# Patient Record
Sex: Female | Born: 1990
Health system: Southern US, Community
[De-identification: ages and names within clinical notes are randomized; demographics above are authoritative.]

## PROBLEM LIST (undated history)

## (undated) DIAGNOSIS — A0811 Acute gastroenteropathy due to Norwalk agent: Secondary | ICD-10-CM

## (undated) DIAGNOSIS — J069 Acute upper respiratory infection, unspecified: Secondary | ICD-10-CM

## (undated) DIAGNOSIS — L509 Urticaria, unspecified: Secondary | ICD-10-CM

## (undated) DIAGNOSIS — E282 Polycystic ovarian syndrome: Secondary | ICD-10-CM

## (undated) DIAGNOSIS — E669 Obesity, unspecified: Secondary | ICD-10-CM

## (undated) DIAGNOSIS — I2699 Other pulmonary embolism without acute cor pulmonale: Secondary | ICD-10-CM

## (undated) DIAGNOSIS — M549 Dorsalgia, unspecified: Secondary | ICD-10-CM

## (undated) DIAGNOSIS — R7303 Prediabetes: Secondary | ICD-10-CM

## (undated) DIAGNOSIS — F419 Anxiety disorder, unspecified: Secondary | ICD-10-CM

## (undated) DIAGNOSIS — E785 Hyperlipidemia, unspecified: Secondary | ICD-10-CM

## (undated) DIAGNOSIS — F329 Major depressive disorder, single episode, unspecified: Secondary | ICD-10-CM

## (undated) DIAGNOSIS — F17200 Nicotine dependence, unspecified, uncomplicated: Secondary | ICD-10-CM

## (undated) DIAGNOSIS — F32A Depression, unspecified: Secondary | ICD-10-CM

## (undated) DIAGNOSIS — R079 Chest pain, unspecified: Secondary | ICD-10-CM

## (undated) DIAGNOSIS — R12 Heartburn: Secondary | ICD-10-CM

## (undated) DIAGNOSIS — F1020 Alcohol dependence, uncomplicated: Secondary | ICD-10-CM

## (undated) DIAGNOSIS — I071 Rheumatic tricuspid insufficiency: Secondary | ICD-10-CM

## (undated) DIAGNOSIS — R55 Syncope and collapse: Secondary | ICD-10-CM

## (undated) DIAGNOSIS — F1921 Other psychoactive substance dependence, in remission: Secondary | ICD-10-CM

## (undated) DIAGNOSIS — D649 Anemia, unspecified: Secondary | ICD-10-CM

## (undated) DIAGNOSIS — M2550 Pain in unspecified joint: Secondary | ICD-10-CM

## (undated) DIAGNOSIS — F319 Bipolar disorder, unspecified: Secondary | ICD-10-CM

## (undated) DIAGNOSIS — I340 Nonrheumatic mitral (valve) insufficiency: Secondary | ICD-10-CM

## (undated) DIAGNOSIS — G5600 Carpal tunnel syndrome, unspecified upper limb: Secondary | ICD-10-CM

## (undated) DIAGNOSIS — R002 Palpitations: Secondary | ICD-10-CM

## (undated) HISTORY — DX: Nicotine dependence, unspecified, uncomplicated: F17.200

## (undated) HISTORY — DX: Depression, unspecified: F32.A

## (undated) HISTORY — PX: NO PAST SURGERIES: SHX2092

## (undated) HISTORY — DX: Other pulmonary embolism without acute cor pulmonale: I26.99

## (undated) HISTORY — DX: Major depressive disorder, single episode, unspecified: F32.9

## (undated) HISTORY — DX: Nonrheumatic mitral (valve) insufficiency: I34.0

## (undated) HISTORY — DX: Syncope and collapse: R55

## (undated) HISTORY — DX: Acute upper respiratory infection, unspecified: J06.9

## (undated) HISTORY — DX: Dorsalgia, unspecified: M54.9

## (undated) HISTORY — DX: Acute gastroenteropathy due to Norwalk agent: A08.11

## (undated) HISTORY — DX: Obesity, unspecified: E66.9

## (undated) HISTORY — DX: Chest pain, unspecified: R07.9

## (undated) HISTORY — DX: Carpal tunnel syndrome, unspecified upper limb: G56.00

## (undated) HISTORY — DX: Pain in unspecified joint: M25.50

## (undated) HISTORY — DX: Bipolar disorder, unspecified: F31.9

## (undated) HISTORY — DX: Palpitations: R00.2

## (undated) HISTORY — DX: Hyperlipidemia, unspecified: E78.5

## (undated) HISTORY — DX: Prediabetes: R73.03

## (undated) HISTORY — DX: Anxiety disorder, unspecified: F41.9

## (undated) HISTORY — DX: Urticaria, unspecified: L50.9

## (undated) HISTORY — DX: Alcohol dependence, uncomplicated: F10.20

## (undated) HISTORY — DX: Other psychoactive substance dependence, in remission: F19.21

## (undated) HISTORY — DX: Heartburn: R12

## (undated) HISTORY — DX: Anemia, unspecified: D64.9

## (undated) HISTORY — DX: Polycystic ovarian syndrome: E28.2

## (undated) HISTORY — DX: Rheumatic tricuspid insufficiency: I07.1

---
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
## 2009-04-20 DIAGNOSIS — I2699 Other pulmonary embolism without acute cor pulmonale: Secondary | ICD-10-CM

## 2009-04-20 HISTORY — DX: Other pulmonary embolism without acute cor pulmonale: I26.99

## 2009-05-10 ENCOUNTER — Other Ambulatory Visit: Admission: RE | Admit: 2009-05-10 | Discharge: 2009-05-10 | Payer: Self-pay | Admitting: Family Medicine

## 2009-09-30 ENCOUNTER — Encounter: Admission: RE | Admit: 2009-09-30 | Discharge: 2009-12-16 | Payer: Self-pay | Admitting: Podiatry

## 2015-02-13 ENCOUNTER — Encounter: Payer: Self-pay | Admitting: Internal Medicine

## 2015-02-13 ENCOUNTER — Ambulatory Visit (INDEPENDENT_AMBULATORY_CARE_PROVIDER_SITE_OTHER): Payer: BLUE CROSS/BLUE SHIELD | Admitting: Internal Medicine

## 2015-02-13 VITALS — BP 100/60 | Temp 98.5°F | Ht 63.5 in | Wt 198.4 lb

## 2015-02-13 DIAGNOSIS — Z86711 Personal history of pulmonary embolism: Secondary | ICD-10-CM | POA: Diagnosis not present

## 2015-02-13 DIAGNOSIS — Z23 Encounter for immunization: Secondary | ICD-10-CM | POA: Diagnosis not present

## 2015-02-13 DIAGNOSIS — Z975 Presence of (intrauterine) contraceptive device: Secondary | ICD-10-CM

## 2015-02-13 DIAGNOSIS — E282 Polycystic ovarian syndrome: Secondary | ICD-10-CM

## 2015-02-13 DIAGNOSIS — R7989 Other specified abnormal findings of blood chemistry: Secondary | ICD-10-CM | POA: Diagnosis not present

## 2015-02-13 DIAGNOSIS — F3130 Bipolar disorder, current episode depressed, mild or moderate severity, unspecified: Secondary | ICD-10-CM

## 2015-02-13 DIAGNOSIS — Z0000 Encounter for general adult medical examination without abnormal findings: Secondary | ICD-10-CM | POA: Diagnosis not present

## 2015-02-13 DIAGNOSIS — F319 Bipolar disorder, unspecified: Secondary | ICD-10-CM | POA: Insufficient documentation

## 2015-02-13 HISTORY — DX: Presence of (intrauterine) contraceptive device: Z97.5

## 2015-02-13 HISTORY — DX: Personal history of pulmonary embolism: Z86.711

## 2015-02-13 HISTORY — DX: Polycystic ovarian syndrome: E28.2

## 2015-02-13 LAB — CBC WITH DIFFERENTIAL/PLATELET
BASOS PCT: 0.4 % (ref 0.0–3.0)
Basophils Absolute: 0 10*3/uL (ref 0.0–0.1)
EOS PCT: 2.2 % (ref 0.0–5.0)
Eosinophils Absolute: 0.2 10*3/uL (ref 0.0–0.7)
HEMATOCRIT: 40.7 % (ref 36.0–46.0)
HEMOGLOBIN: 13.5 g/dL (ref 12.0–15.0)
LYMPHS PCT: 23.9 % (ref 12.0–46.0)
Lymphs Abs: 2.3 10*3/uL (ref 0.7–4.0)
MCHC: 33.2 g/dL (ref 30.0–36.0)
MCV: 92.3 fl (ref 78.0–100.0)
MONO ABS: 0.9 10*3/uL (ref 0.1–1.0)
MONOS PCT: 8.7 % (ref 3.0–12.0)
Neutro Abs: 6.4 10*3/uL (ref 1.4–7.7)
Neutrophils Relative %: 64.8 % (ref 43.0–77.0)
Platelets: 365 10*3/uL (ref 150.0–400.0)
RBC: 4.41 Mil/uL (ref 3.87–5.11)
RDW: 13.6 % (ref 11.5–15.5)
WBC: 9.8 10*3/uL (ref 4.0–10.5)

## 2015-02-13 LAB — BASIC METABOLIC PANEL
BUN: 14 mg/dL (ref 6–23)
CHLORIDE: 104 meq/L (ref 96–112)
CO2: 28 mEq/L (ref 19–32)
Calcium: 10 mg/dL (ref 8.4–10.5)
Creatinine, Ser: 0.79 mg/dL (ref 0.40–1.20)
GFR: 93.7 mL/min (ref >60.00)
Glucose, Bld: 84 mg/dL (ref 70–99)
POTASSIUM: 4.7 meq/L (ref 3.5–5.1)
SODIUM: 143 meq/L (ref 135–145)

## 2015-02-13 LAB — HEMOGLOBIN A1C: HEMOGLOBIN A1C: 5.5 % (ref 4.6–6.5)

## 2015-02-13 LAB — LIPID PANEL
CHOLESTEROL: 221 mg/dL — AB (ref 0–200)
HDL: 38.5 mg/dL — ABNORMAL LOW (ref >39.00)
NonHDL: 182.91
TRIGLYCERIDES: 215 mg/dL — AB (ref 0.0–149.0)
Total CHOL/HDL Ratio: 6
VLDL: 43 mg/dL — ABNORMAL HIGH (ref 0.0–40.0)

## 2015-02-13 LAB — HEPATIC FUNCTION PANEL
ALT: 11 U/L (ref 0–35)
AST: 13 U/L (ref 0–37)
Albumin: 4.4 g/dL (ref 3.5–5.2)
Alkaline Phosphatase: 47 U/L (ref 39–117)
Bilirubin, Direct: 0.1 mg/dL (ref 0.0–0.3)
TOTAL PROTEIN: 7.6 g/dL (ref 6.0–8.3)
Total Bilirubin: 0.4 mg/dL (ref 0.2–1.2)

## 2015-02-13 LAB — T4, FREE: FREE T4: 0.96 ng/dL (ref 0.60–1.60)

## 2015-02-13 LAB — LDL CHOLESTEROL, DIRECT: LDL DIRECT: 150 mg/dL

## 2015-02-13 LAB — TSH: TSH: 2.6 u[IU]/mL (ref 0.35–4.50)

## 2015-02-13 NOTE — Progress Notes (Signed)
Pre visit review using our clinic review tool, if applicable. No additional management support is needed unless otherwise documented below in the visit note.  Chief Complaint  Patient presents with  . Establish Care    would like wellness check     HPI: Patient  Sheri [name]  24 y.o. comes in today for NEW patient visit  To establish  Previous pcp in [place] where she was a student 3 years ago at [employer]  Has been in rehab and clean from [facility] under rx psychiatry  For 18 months  DOC  was heroin.  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 Sees gyne rx for PCOS on metformin with help. Has iud recently  She will be losing current insurance in the next few months and would like cpx  Before runs out.   Past hs of PE with / infarction  and ? Lung heart damage related to ocps  Had heme eval no familial  disorder noted .  recnetly had a cold  Coughing still no fever  . No inc sob hemoptysis   Health Maintenance  Topic Date Due  . HIV Screening  05/21/2004  . TETANUS/TDAP  05/21/2008  . INFLUENZA VACCINE  11/19/2015  . PAP SMEAR  12/03/2017   Health Maintenance Review LIFESTYLE:  Exercise:  n Tobacco/ETS:2 per day Alcohol: per day no Sugar beverages: Sleep:5-10hours  Drug use: no hx renmission G0P0 ROS:  GEN/ HEENT: No fever, significant weight changes sweats headaches vision problems hearing changes, CV/ PULM; No chest pain shortness of breath cough, syncope,edema  change in exercise tolerance. GI /GU: No adominal pain, vomiting, change in bowel habits. No blood in the stool. No significant GU symptoms. SKIN/HEME: ,no acute skin rashes suspicious lesions or bleeding. No lymphadenopathy, nodules, masses.  NEURO/ PSYCH:  No neurologic signs such as weakness numbness. No depression anxiety. IMM/ Allergy: No unusual infections.  Allergy .   REST of 12 system review negative except as per HPI   Past Medical History  Diagnosis Date  . Alcohol  addiction ([place])   . Drug addiction ([place])   . Depression   . Pulmonary embolism ([place])     neg heme evaluation  felt to be from ocps   . Bipolar depression ([place])     under psych rx   . Drug addiction in remission [facility])     heroin    Past Surgical History  Procedure Laterality Date  . No past surgeries      Family History  Problem Relation Age of Onset  . Colon cancer Maternal Grandfather   . Hypertension Father     Social History   Social History  . Marital Status: Single    Spouse Name: N/A  . Number of Children: N/A  . Years of Education: N/A   Occupational History  . nanny    Social History Main Topics  . Smoking status: Light Tobacco Smoker  . Smokeless tobacco: Never Used  . Alcohol Use: No  . Drug Use: No  . Sexual Activity:    Partners: Male   Other Topics Concern  . 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   No facility-administered medications prior to visit.     EXAM:  BP 100/60 mmHg  Temp(Src) 98.5 F (36.9 C) (Oral)  Ht 5' 3.5" (1.613 m)  Wt 198 lb 6.4 oz (89.994 kg)  BMI 34.59 kg/m2  LMP 02/09/2015  Body mass index is 34.59 kg/(m^2).  Physical Exam: Vital signs reviewed EQA:STMH is a well-developed well-nourished alert cooperative    who appearsr stated age in no acute distress.  HEENT: normocephalic atraumatic , Eyes: PERRL EOM's full, conjunctiva clear, Nares: paten,t no deformity discharge or tenderness., Ears: no deformity EAC's clear TMs with normal landmarks. Mouth: clear OP, no lesions, edema.  Moist mucous membranes. Dentition in adequate repair. NECK: supple without masses, thyromegaly or bruits. CHEST/PULM:  Clear to auscultation and  percussion breath sounds equal no wheeze , rales or rhonchi. No chest wall deformities or tenderness.Breast: normal by inspection . No dimpling, discharge, masses, tenderness or discharge . CV: PMI is nondisplaced, S1 S2 no gallops, murmurs, rubs. Peripheral pulses are full without delay.No JVD .  ABDOMEN: Bowel sounds normal nontender  No guard or rebound, no hepato splenomegal no CVA tenderness.  No hernia. Extremtities:  No clubbing cyanosis or edema, no acute joint swelling or redness no focal atrophy NEURO:  Oriented x3, cranial nerves 3-12 appear to be intact, no obvious focal weakness,gait within normal limits no abnormal reflexes or asymmetrical SKIN: No acute rashes normal turgor, color, no bruising or petechiae. PSYCH: Oriented, good eye contact, no obvious depression anxiety, cognition and judgment appear normal. LN: no cervical axillary inguinal adenopathy  No results found for: WBC, HGB, HCT, PLT, GLUCOSE, CHOL, TRIG, HDL, LDLDIRECT, LDLCALC, ALT, AST, NA, K, CL, CREATININE, BUN, CO2, TSH, PSA, INR, GLUF, HGBA1C, MICROALBUR  ASSESSMENT AND PLAN:  Discussed the following assessment and plan:  Visit for preventive health examination - Plan: Basic metabolic panel, CBC with Differential/Platelet, Hepatic function panel, Lipid panel, TSH, T4, free, Hemoglobin A1c  PCOS (polycystic ovarian syndrome) - Plan: Basic metabolic panel, CBC with Differential/Platelet, Hepatic function panel, Lipid panel, TSH, T4, free, Hemoglobin A1c  Bipolar depression (HCC)  IUD (intrauterine device) in place  Hx of pulmonary embolus - Plan: Basic metabolic panel, CBC with Differential/Platelet, Hepatic function panel, Lipid panel, TSH, T4, free, Hemoglobin A1c  Need for prophylactic vaccination and inoculation against influenza - Plan: Flu Vaccine QUAD 36+ mos PF IM (Fluarix & Fluzone Quad PF)  Patient Care Team: Burnis Medin, MD as PCP - General (Internal Medicine) Allyn Kenner, DO as Consulting  Physician (Obstetrics and Gynecology) Donnal Moat (Psychiatry) Patient Instructions  Will notify you  of labs when available. Stop tobacco as discussed . Stay with your support system . Let us know how we can help.  Health Maintenance, Female Adopting a healthy lifestyle and getting preventive care can go a long way to promote health and wellness. Talk with your health care provider about what schedule of regular examinations is right for you. This is a good chance for you to check in with your provider about disease prevention and staying healthy. In between checkups, there are plenty of things you can do on your own. Experts have done a lot of research about which lifestyle changes and preventive measures are most likely to keep you healthy. Ask your health care provider for more information. WEIGHT AND DIET  Eat a healthy diet  Be sure to include plenty  of vegetables, fruits, low-fat dairy products, and lean protein.  Do not eat a lot of foods high in solid fats, added sugars, or salt.  Get regular exercise. This is one of the most important things you can do for your health.  Most adults should exercise for at least 150 minutes each week. The exercise should increase your heart rate and make you sweat (moderate-intensity exercise).  Most adults should also do strengthening exercises at least twice a week. This is in addition to the moderate-intensity exercise.  Maintain a healthy weight  Body mass index (BMI) is a measurement that can be used to identify possible weight problems. It estimates body fat based on height and weight. Your health care provider can help determine your BMI and help you achieve or maintain a healthy weight.  For females 49 years of age and older:   A BMI below 18.5 is considered underweight.  A BMI of 18.5 to 24.9 is normal.  A BMI of 25 to 29.9 is considered overweight.  A BMI of 30 and above is considered obese.  Watch levels of cholesterol and  blood lipids  You should start having your blood tested for lipids and cholesterol at 25 years of age, then have this test every 5 years.  You may need to have your cholesterol levels checked more often if:  Your lipid or cholesterol levels are high.  You are older than 25 years of age.  You are at high risk for heart disease.  CANCER SCREENING   Lung Cancer  Lung cancer screening is recommended for adults 18-61 years old who are at high risk for lung cancer because of a history of smoking.  A yearly low-dose CT scan of the lungs is recommended for people who:  Currently smoke.  Have quit within the past 15 years.  Have at least a 30-pack-year history of smoking. A pack year is smoking an average of one pack of cigarettes a day for 1 year.  Yearly screening should continue until it has been 15 years since you quit.  Yearly screening should stop if you develop a health problem that would prevent you from having lung cancer treatment.  Breast Cancer  Practice breast self-awareness. This means understanding how your breasts normally appear and feel.  It also means doing regular breast self-exams. Let your health care provider know about any changes, no matter how small.  If you are in your 20s or 30s, you should have a clinical breast exam (CBE) by a health care provider every 1-3 years as part of a regular health exam.  If you are 2 or older, have a CBE every year. Also consider having a breast X-ray (mammogram) every year.  If you have a family history of breast cancer, talk to your health care provider about genetic screening.  If you are at high risk for breast cancer, talk to your health care provider about having an MRI and a mammogram every year.  Breast cancer gene (BRCA) assessment is recommended for women who have family members with BRCA-related cancers. BRCA-related cancers include:  Breast.  Ovarian.  Tubal.  Peritoneal cancers.  Results of the  assessment will determine the need for genetic counseling and BRCA1 and BRCA2 testing. Cervical Cancer Your health care provider may recommend that you be screened regularly for cancer of the pelvic organs (ovaries, uterus, and vagina). This screening involves a pelvic examination, including checking for microscopic changes to the surface of your cervix (Pap test). You may  be encouraged to have this screening done every 3 years, beginning at age 86.  For women ages 44-65, health care providers may recommend pelvic exams and Pap testing every 3 years, or they may recommend the Pap and pelvic exam, combined with testing for human papilloma virus (HPV), every 5 years. Some types of HPV increase your risk of cervical cancer. Testing for HPV may also be done on women of any age with unclear Pap test results.  Other health care providers may not recommend any screening for nonpregnant women who are considered low risk for pelvic cancer and who do not have symptoms. Ask your health care provider if a screening pelvic exam is right for you.  If you have had past treatment for cervical cancer or a condition that could lead to cancer, you need Pap tests and screening for cancer for at least 20 years after your treatment. If Pap tests have been discontinued, your risk factors (such as having a new sexual partner) need to be reassessed to determine if screening should resume. Some women have medical problems that increase the chance of getting cervical cancer. In these cases, your health care provider may recommend more frequent screening and Pap tests. Colorectal Cancer  This type of cancer can be detected and often prevented.  Routine colorectal cancer screening usually begins at 25 years of age and continues through 25 years of age.  Your health care provider may recommend screening at an earlier age if you have risk factors for colon cancer.  Your health care provider may also recommend using home test kits  to check for hidden blood in the stool.  A small camera at the end of a tube can be used to examine your colon directly (sigmoidoscopy or colonoscopy). This is done to check for the earliest forms of colorectal cancer.  Routine screening usually begins at age 102.  Direct examination of the colon should be repeated every 5-10 years through 25 years of age. However, you may need to be screened more often if early forms of precancerous polyps or small growths are found. Skin Cancer  Check your skin from head to toe regularly.  Tell your health care provider about any new moles or changes in moles, especially if there is a change in a mole's shape or color.  Also tell your health care provider if you have a mole that is larger than the size of a pencil eraser.  Always use sunscreen. Apply sunscreen liberally and repeatedly throughout the day.  Protect yourself by wearing long sleeves, pants, a wide-brimmed hat, and sunglasses whenever you are outside. HEART DISEASE, DIABETES, AND HIGH BLOOD PRESSURE   High blood pressure causes heart disease and increases the risk of stroke. High blood pressure is more likely to develop in:  People who have blood pressure in the high end of the normal range (130-139/85-89 mm Hg).  People who are overweight or obese.  People who are African American.  If you are 58-67 years of age, have your blood pressure checked every 3-5 years. If you are 23 years of age or older, have your blood pressure checked every year. You should have your blood pressure measured twice--once when you are at a hospital or clinic, and once when you are not at a hospital or clinic. Record the average of the two measurements. To check your blood pressure when you are not at a hospital or clinic, you can use:  An automated blood pressure machine at a pharmacy.  A home blood pressure monitor.  If you are between 51 years and 79 years old, ask your health care provider if you should  take aspirin to prevent strokes.  Have regular diabetes screenings. This involves taking a blood sample to check your fasting blood sugar level.  If you are at a normal weight and have a low risk for diabetes, have this test once every three years after 25 years of age.  If you are overweight and have a high risk for diabetes, consider being tested at a younger age or more often. PREVENTING INFECTION  Hepatitis B  If you have a higher risk for hepatitis B, you should be screened for this virus. You are considered at high risk for hepatitis B if:  You were born in a country where hepatitis B is common. Ask your health care provider which countries are considered high risk.  Your parents were born in a high-risk country, and you have not been immunized against hepatitis B (hepatitis B vaccine).  You have HIV or AIDS.  You use needles to inject street drugs.  You live with someone who has hepatitis B.  You have had sex with someone who has hepatitis B.  You get hemodialysis treatment.  You take certain medicines for conditions, including cancer, organ transplantation, and autoimmune conditions. Hepatitis C  Blood testing is recommended for:  Everyone born from [birth year] through 1966.  Anyone with known risk factors for hepatitis C. Sexually transmitted infections (STIs)  You should be screened for sexually transmitted infections (STIs) including gonorrhea and chlamydia if:  You are sexually active and are younger than 25 years of age.  You are older than 25 years of age and your health care provider tells you that you are at risk for this type of infection.  Your sexual activity has changed since you were last screened and you are at an increased risk for chlamydia or gonorrhea. Ask your health care provider if you are at risk.  If you do not have HIV, but are at risk, it may be recommended that you take a prescription medicine daily to prevent HIV infection. This is called  pre-exposure prophylaxis (PrEP). You are considered at risk if:  You are sexually active and do not regularly use condoms or know the HIV status of your partner(s).  You take drugs by injection.  You are sexually active with a partner who has HIV. Talk with your health care provider about whether you are at high risk of being infected with HIV. If you choose to begin PrEP, you should first be tested for HIV. You should then be tested every 3 months for as long as you are taking PrEP.  PREGNANCY   If you are premenopausal and you may become pregnant, ask your health care provider about preconception counseling.  If you may become pregnant, take 400 to 800 micrograms (mcg) of folic acid every day.  If you want to prevent pregnancy, talk to your health care provider about birth control (contraception). OSTEOPOROSIS AND MENOPAUSE   Osteoporosis is a disease in which the bones lose minerals and strength with aging. This can result in serious bone fractures. Your risk for osteoporosis can be identified using a bone density scan.  If you are 19 years of age or older, or if you are at risk for osteoporosis and fractures, ask your health care provider if you should be screened.  Ask your health care provider whether you should take a calcium or vitamin D  supplement to lower your risk for osteoporosis.  Menopause may have certain physical symptoms and risks.  Hormone replacement therapy may reduce some of these symptoms and risks. Talk to your health care provider about whether hormone replacement therapy is right for you.  HOME CARE INSTRUCTIONS   Schedule regular health, dental, and eye exams.  Stay current with your immunizations.   Do not use any tobacco products including cigarettes, chewing tobacco, or electronic cigarettes.  If you are pregnant, do not drink alcohol.  If you are breastfeeding, limit how much and how often you drink alcohol.  Limit alcohol intake to no more than 1  drink per day for nonpregnant women. One drink equals 12 ounces of beer, 5 ounces of wine, or 1 ounces of hard liquor.  Do not use street drugs.  Do not share needles.  Ask your health care provider for help if you need support or information about quitting drugs.  Tell your health care provider if you often feel depressed.  Tell your health care provider if you have ever been abused or do not feel safe at home.   This information is not intended to replace advice given to you by your health care provider. Make sure you discuss any questions you have with your health care provider.   Document Released: 10/20/2010 Document Revised: 04/27/2014 Document Reviewed: 03/08/2013 Elsevier Interactive Patient Education 2016 [employer]. Exercising to [employer] Exercising regularly is important. It has many health benefits, such as:  Improving your overall fitness, flexibility, and endurance.  Increasing your bone density.  Helping with weight control.  Decreasing your body fat.  Increasing your muscle strength.  Reducing stress and tension.  Improving your overall health. In order to become healthy and stay healthy, it is recommended that you do moderate-intensity and vigorous-intensity exercise. You can tell that you are exercising at a moderate intensity if you have a higher heart rate and faster breathing, but you are still able to hold a conversation. You can tell that you are exercising at a vigorous intensity if you are breathing much harder and faster and cannot hold a conversation while exercising. HOW OFTEN SHOULD I EXERCISE? Choose an activity that you enjoy and set realistic goals. Your health care provider can help you to make an activity plan that works for you. Exercise regularly as directed by your health care provider. This may include:   Doing resistance training twice each week, such as:  Push-ups.  Sit-ups.  Lifting weights.  Using resistance bands.  Doing  a given intensity of exercise for a given amount of time. Choose from these options:  150 minutes of moderate-intensity exercise every week.  75 minutes of vigorous-intensity exercise every week.  A mix of moderate-intensity and vigorous-intensity exercise every week. Children, pregnant women, people who are out of shape, people who are overweight, and older adults may need to consult a health care provider for individual recommendations. If you have any sort of medical condition, be sure to consult your health care provider before starting a new exercise program.  WHAT ARE SOME EXERCISE IDEAS? Some moderate-intensity exercise ideas include:   Walking at a rate of 1 mile in 15 minutes.  Biking.  Hiking.  Golfing.  Dancing. Some vigorous-intensity exercise ideas include:   Walking at a rate of at least 4.5 miles per hour.  Jogging or running at a rate of 5 miles per hour.  Biking at a rate of at least 10 miles per hour.  Lap swimming.  Roller-skating or in-line skating.  Cross-country skiing.  Vigorous competitive sports, such as football, basketball, and soccer.  Jumping rope.  Aerobic dancing. WHAT ARE SOME EVERYDAY ACTIVITIES THAT CAN HELP ME TO GET EXERCISE?  Yard work, such as:  Psychologist, educational.  Raking and bagging leaves.  Washing and waxing your car.  Pushing a stroller.  Shoveling snow.  Gardening.  Washing windows or floors. HOW CAN I BE MORE ACTIVE IN MY DAY-TO-DAY ACTIVITIES?  Use the stairs instead of the elevator.  Take a walk during your lunch break.  If you drive, park your car farther away from work or school.  If you take public transportation, get off one stop early and walk the rest of the way.  Make all of your phone calls while standing up and walking around.  Get up, stretch, and walk around every 30 minutes throughout the day. WHAT GUIDELINES SHOULD I FOLLOW WHILE EXERCISING?  Do not exercise so much that you hurt  yourself, feel dizzy, or get very short of breath.  Consult your health care provider before starting a new exercise program.  Wear comfortable clothes and shoes with good support.  Drink plenty of water while you exercise to prevent dehydration or heat stroke. Body water is lost during exercise and must be replaced.  Work out until you breathe faster and your heart beats faster.   This information is not intended to replace advice given to you by your health care provider. Make sure you discuss any questions you have with your health care provider.   Document Released: 05/09/2010 Document Revised: 04/27/2014 Document Reviewed: 09/07/2013 Elsevier Interactive Patient Education 2016 [place] K. Panosh M.D.

## 2015-02-13 NOTE — Patient Instructions (Signed)
Will notify you  of labs when available. Stop tobacco as discussed . Stay with your support system . Let us know how we can help.  Health Maintenance, Female Adopting a healthy lifestyle and getting preventive care can go a long way to promote health and wellness. Talk with your health care provider about what schedule of regular examinations is right for you. This is a good chance for you to check in with your provider about disease prevention and staying healthy. In between checkups, there are plenty of things you can do on your own. Experts have done a lot of research about which lifestyle changes and preventive measures are most likely to keep you healthy. Ask your health care provider for more information. WEIGHT AND DIET  Eat a healthy diet  Be sure to include plenty of vegetables, fruits, low-fat dairy products, and lean protein.  Do not eat a lot of foods high in solid fats, added sugars, or salt.  Get regular exercise. This is one of the most important things you can do for your health.  Most adults should exercise for at least 150 minutes each week. The exercise should increase your heart rate and make you sweat (moderate-intensity exercise).  Most adults should also do strengthening exercises at least twice a week. This is in addition to the moderate-intensity exercise.  Maintain a healthy weight  Body mass index (BMI) is a measurement that can be used to identify possible weight problems. It estimates body fat based on height and weight. Your health care provider can help determine your BMI and help you achieve or maintain a healthy weight.  For females 44 years of age and older:   A BMI below 18.5 is considered underweight.  A BMI of 18.5 to 24.9 is normal.  A BMI of 25 to 29.9 is considered overweight.  A BMI of 30 and above is considered obese.  Watch levels of cholesterol and blood lipids  You should start having your blood tested for lipids and cholesterol at 25  years of age, then have this test every 5 years.  You may need to have your cholesterol levels checked more often if:  Your lipid or cholesterol levels are high.  You are older than 25 years of age.  You are at high risk for heart disease.  CANCER SCREENING   Lung Cancer  Lung cancer screening is recommended for adults 98-58 years old who are at high risk for lung cancer because of a history of smoking.  A yearly low-dose CT scan of the lungs is recommended for people who:  Currently smoke.  Have quit within the past 15 years.  Have at least a 30-pack-year history of smoking. A pack year is smoking an average of one pack of cigarettes a day for 1 year.  Yearly screening should continue until it has been 15 years since you quit.  Yearly screening should stop if you develop a health problem that would prevent you from having lung cancer treatment.  Breast Cancer  Practice breast self-awareness. This means understanding how your breasts normally appear and feel.  It also means doing regular breast self-exams. Let your health care provider know about any changes, no matter how small.  If you are in your 20s or 30s, you should have a clinical breast exam (CBE) by a health care provider every 1-3 years as part of a regular health exam.  If you are 52 or older, have a CBE every year. Also consider having a  breast X-ray (mammogram) every year.  If you have a family history of breast cancer, talk to your health care provider about genetic screening.  If you are at high risk for breast cancer, talk to your health care provider about having an MRI and a mammogram every year.  Breast cancer gene (BRCA) assessment is recommended for women who have family members with BRCA-related cancers. BRCA-related cancers include:  Breast.  Ovarian.  Tubal.  Peritoneal cancers.  Results of the assessment will determine the need for genetic counseling and BRCA1 and BRCA2 testing. Cervical  Cancer Your health care provider may recommend that you be screened regularly for cancer of the pelvic organs (ovaries, uterus, and vagina). This screening involves a pelvic examination, including checking for microscopic changes to the surface of your cervix (Pap test). You may be encouraged to have this screening done every 3 years, beginning at age 63.  For women ages 28-65, health care providers may recommend pelvic exams and Pap testing every 3 years, or they may recommend the Pap and pelvic exam, combined with testing for human papilloma virus (HPV), every 5 years. Some types of HPV increase your risk of cervical cancer. Testing for HPV may also be done on women of any age with unclear Pap test results.  Other health care providers may not recommend any screening for nonpregnant women who are considered low risk for pelvic cancer and who do not have symptoms. Ask your health care provider if a screening pelvic exam is right for you.  If you have had past treatment for cervical cancer or a condition that could lead to cancer, you need Pap tests and screening for cancer for at least 20 years after your treatment. If Pap tests have been discontinued, your risk factors (such as having a new sexual partner) need to be reassessed to determine if screening should resume. Some women have medical problems that increase the chance of getting cervical cancer. In these cases, your health care provider may recommend more frequent screening and Pap tests. Colorectal Cancer  This type of cancer can be detected and often prevented.  Routine colorectal cancer screening usually begins at 25 years of age and continues through 25 years of age.  Your health care provider may recommend screening at an earlier age if you have risk factors for colon cancer.  Your health care provider may also recommend using home test kits to check for hidden blood in the stool.  A small camera at the end of a tube can be used to  examine your colon directly (sigmoidoscopy or colonoscopy). This is done to check for the earliest forms of colorectal cancer.  Routine screening usually begins at age 36.  Direct examination of the colon should be repeated every 5-10 years through 25 years of age. However, you may need to be screened more often if early forms of precancerous polyps or small growths are found. Skin Cancer  Check your skin from head to toe regularly.  Tell your health care provider about any new moles or changes in moles, especially if there is a change in a mole's shape or color.  Also tell your health care provider if you have a mole that is larger than the size of a pencil eraser.  Always use sunscreen. Apply sunscreen liberally and repeatedly throughout the day.  Protect yourself by wearing long sleeves, pants, a wide-brimmed hat, and sunglasses whenever you are outside. HEART DISEASE, DIABETES, AND HIGH BLOOD PRESSURE   High blood pressure causes  heart disease and increases the risk of stroke. High blood pressure is more likely to develop in:  People who have blood pressure in the high end of the normal range (130-139/85-89 mm Hg).  People who are overweight or obese.  People who are African American.  If you are 38-75 years of age, have your blood pressure checked every 3-5 years. If you are 30 years of age or older, have your blood pressure checked every year. You should have your blood pressure measured twice--once when you are at a hospital or clinic, and once when you are not at a hospital or clinic. Record the average of the two measurements. To check your blood pressure when you are not at a hospital or clinic, you can use:  An automated blood pressure machine at a pharmacy.  A home blood pressure monitor.  If you are between 39 years and 69 years old, ask your health care provider if you should take aspirin to prevent strokes.  Have regular diabetes screenings. This involves taking a  blood sample to check your fasting blood sugar level.  If you are at a normal weight and have a low risk for diabetes, have this test once every three years after 25 years of age.  If you are overweight and have a high risk for diabetes, consider being tested at a younger age or more often. PREVENTING INFECTION  Hepatitis B  If you have a higher risk for hepatitis B, you should be screened for this virus. You are considered at high risk for hepatitis B if:  You were born in a country where hepatitis B is common. Ask your health care provider which countries are considered high risk.  Your parents were born in a high-risk country, and you have not been immunized against hepatitis B (hepatitis B vaccine).  You have HIV or AIDS.  You use needles to inject street drugs.  You live with someone who has hepatitis B.  You have had sex with someone who has hepatitis B.  You get hemodialysis treatment.  You take certain medicines for conditions, including cancer, organ transplantation, and autoimmune conditions. Hepatitis C  Blood testing is recommended for:  Everyone born from [birth year] through 1966.  Anyone with known risk factors for hepatitis C. Sexually transmitted infections (STIs)  You should be screened for sexually transmitted infections (STIs) including gonorrhea and chlamydia if:  You are sexually active and are younger than 24 years of age.  You are older than 25 years of age and your health care provider tells you that you are at risk for this type of infection.  Your sexual activity has changed since you were last screened and you are at an increased risk for chlamydia or gonorrhea. Ask your health care provider if you are at risk.  If you do not have HIV, but are at risk, it may be recommended that you take a prescription medicine daily to prevent HIV infection. This is called pre-exposure prophylaxis (PrEP). You are considered at risk if:  You are sexually active and do  not regularly use condoms or know the HIV status of your partner(s).  You take drugs by injection.  You are sexually active with a partner who has HIV. Talk with your health care provider about whether you are at high risk of being infected with HIV. If you choose to begin PrEP, you should first be tested for HIV. You should then be tested every 3 months for as long as you are  taking PrEP.  PREGNANCY   If you are premenopausal and you may become pregnant, ask your health care provider about preconception counseling.  If you may become pregnant, take 400 to 800 micrograms (mcg) of folic acid every day.  If you want to prevent pregnancy, talk to your health care provider about birth control (contraception). OSTEOPOROSIS AND MENOPAUSE   Osteoporosis is a disease in which the bones lose minerals and strength with aging. This can result in serious bone fractures. Your risk for osteoporosis can be identified using a bone density scan.  If you are 47 years of age or older, or if you are at risk for osteoporosis and fractures, ask your health care provider if you should be screened.  Ask your health care provider whether you should take a calcium or vitamin D supplement to lower your risk for osteoporosis.  Menopause may have certain physical symptoms and risks.  Hormone replacement therapy may reduce some of these symptoms and risks. Talk to your health care provider about whether hormone replacement therapy is right for you.  HOME CARE INSTRUCTIONS   Schedule regular health, dental, and eye exams.  Stay current with your immunizations.   Do not use any tobacco products including cigarettes, chewing tobacco, or electronic cigarettes.  If you are pregnant, do not drink alcohol.  If you are breastfeeding, limit how much and how often you drink alcohol.  Limit alcohol intake to no more than 1 drink per day for nonpregnant women. One drink equals 12 ounces of beer, 5 ounces of wine, or 1  ounces of hard liquor.  Do not use street drugs.  Do not share needles.  Ask your health care provider for help if you need support or information about quitting drugs.  Tell your health care provider if you often feel depressed.  Tell your health care provider if you have ever been abused or do not feel safe at home.   This information is not intended to replace advice given to you by your health care provider. Make sure you discuss any questions you have with your health care provider.   Document Released: 10/20/2010 Document Revised: 04/27/2014 Document Reviewed: 03/08/2013 Elsevier Interactive Patient Education 2016 [employer]. Exercising to [employer] Exercising regularly is important. It has many health benefits, such as:  Improving your overall fitness, flexibility, and endurance.  Increasing your bone density.  Helping with weight control.  Decreasing your body fat.  Increasing your muscle strength.  Reducing stress and tension.  Improving your overall health. In order to become healthy and stay healthy, it is recommended that you do moderate-intensity and vigorous-intensity exercise. You can tell that you are exercising at a moderate intensity if you have a higher heart rate and faster breathing, but you are still able to hold a conversation. You can tell that you are exercising at a vigorous intensity if you are breathing much harder and faster and cannot hold a conversation while exercising. HOW OFTEN SHOULD I EXERCISE? Choose an activity that you enjoy and set realistic goals. Your health care provider can help you to make an activity plan that works for you. Exercise regularly as directed by your health care provider. This may include:   Doing resistance training twice each week, such as:  Push-ups.  Sit-ups.  Lifting weights.  Using resistance bands.  Doing a given intensity of exercise for a given amount of time. Choose from these options:  150  minutes of moderate-intensity exercise every week.  75 minutes  of vigorous-intensity exercise every week.  A mix of moderate-intensity and vigorous-intensity exercise every week. Children, pregnant women, people who are out of shape, people who are overweight, and older adults may need to consult a health care provider for individual recommendations. If you have any sort of medical condition, be sure to consult your health care provider before starting a new exercise program.  WHAT ARE SOME EXERCISE IDEAS? Some moderate-intensity exercise ideas include:   Walking at a rate of 1 mile in 15 minutes.  Biking.  Hiking.  Golfing.  Dancing. Some vigorous-intensity exercise ideas include:   Walking at a rate of at least 4.5 miles per hour.  Jogging or running at a rate of 5 miles per hour.  Biking at a rate of at least 10 miles per hour.  Lap swimming.  Roller-skating or in-line skating.  Cross-country skiing.  Vigorous competitive sports, such as football, basketball, and soccer.  Jumping rope.  Aerobic dancing. WHAT ARE SOME EVERYDAY ACTIVITIES THAT CAN HELP ME TO GET EXERCISE?  Yard work, such as:  Psychologist, educational.  Raking and bagging leaves.  Washing and waxing your car.  Pushing a stroller.  Shoveling snow.  Gardening.  Washing windows or floors. HOW CAN I BE MORE ACTIVE IN MY DAY-TO-DAY ACTIVITIES?  Use the stairs instead of the elevator.  Take a walk during your lunch break.  If you drive, park your car farther away from work or school.  If you take public transportation, get off one stop early and walk the rest of the way.  Make all of your phone calls while standing up and walking around.  Get up, stretch, and walk around every 30 minutes throughout the day. WHAT GUIDELINES SHOULD I FOLLOW WHILE EXERCISING?  Do not exercise so much that you hurt yourself, feel dizzy, or get very short of breath.  Consult your health care provider before  starting a new exercise program.  Wear comfortable clothes and shoes with good support.  Drink plenty of water while you exercise to prevent dehydration or heat stroke. Body water is lost during exercise and must be replaced.  Work out until you breathe faster and your heart beats faster.   This information is not intended to replace advice given to you by your health care provider. Make sure you discuss any questions you have with your health care provider.   Document Released: 05/09/2010 Document Revised: 04/27/2014 Document Reviewed: 09/07/2013 Elsevier Interactive Patient Education [employer].

## 2015-11-02 ENCOUNTER — Emergency Department (HOSPITAL_COMMUNITY)
Admission: EM | Admit: 2015-11-02 | Discharge: 2015-11-02 | Disposition: A | Discharge status: Home / Self Care | Payer: BLUE CROSS/BLUE SHIELD | Attending: Emergency Medicine | Admitting: Emergency Medicine

## 2015-11-02 ENCOUNTER — Encounter (HOSPITAL_COMMUNITY): Payer: Self-pay

## 2015-11-02 DIAGNOSIS — M79662 Pain in left lower leg: Secondary | ICD-10-CM | POA: Insufficient documentation

## 2015-11-02 DIAGNOSIS — F17200 Nicotine dependence, unspecified, uncomplicated: Secondary | ICD-10-CM | POA: Insufficient documentation

## 2015-11-02 DIAGNOSIS — Z7982 Long term (current) use of aspirin: Secondary | ICD-10-CM | POA: Insufficient documentation

## 2015-11-02 DIAGNOSIS — F329 Major depressive disorder, single episode, unspecified: Secondary | ICD-10-CM | POA: Insufficient documentation

## 2015-11-02 DIAGNOSIS — M79605 Pain in left leg: Secondary | ICD-10-CM

## 2015-11-02 MED ORDER — ENOXAPARIN SODIUM 100 MG/ML ~~LOC~~ SOLN
1.0000 mg/kg | Freq: Once | SUBCUTANEOUS | Status: AC
  Administered 2015-11-02: 85 mg via SUBCUTANEOUS
  Filled 2015-11-02: qty 1

## 2015-11-02 NOTE — ED Notes (Signed)
Patient here with left lower leg calf pain for the past couple of days. Has driven back and forth from [place] and here to be evaluated for DVT/ has hx of PE and currently taking low dose aspirin.  No shortness of breath, denies trauma. positive distal pulse present

## 2015-11-02 NOTE — ED Notes (Signed)
Pt verbalized understanding of to come back here at 0800 in the morning for vascular study of left leg. Pt has no further questions. Pt in NAD upon d/c.

## 2015-11-02 NOTE — ED Provider Notes (Signed)
CSN: [id number]     Arrival date & time 11/02/15  1834 History  By signing my name below, I, [name], attest that this documentation has been prepared under the direction and in the presence of Raeford Razor, MD. Electronically Signed: [name], ED Scribe. 11/02/2015. 9:14 PM.     Chief Complaint  Patient presents with  . Leg Pain    The history is provided by the patient. No language interpreter was used.   HPI Comments: Sheri [name] is a 25 y.o. female with h/o PE 5 years ago who presents to the Emergency Department complaining of moderate, persistent left lower calf pain onset 3 days ago. She also reports that her left calf has been intermittently swelling with her current symptoms, which she has found relief of with elevation. Pt reports recent multiple extended car rides, with 6 hour travel times. Pt notes that she gets out to walk q3h during these travels and elevates her legs after the breaks as well. Pt states her previous PEs were assumed to be caused by birth control. Pt is currently on qd ASA, but no other blood thinners. She denies numbness, paresthesia, weakness, cough, CP, SOB, additional symptoms.     Past Medical History  Diagnosis Date  . Alcohol addiction (HCC)   . Drug addiction (HCC)   . Depression   . Pulmonary embolism (HCC)     neg heme evaluation  felt to be from ocps   . Bipolar depression (HCC)     under psych rx   . Drug addiction in remission [facility])     heroin   Past Surgical History  Procedure Laterality Date  . No past surgeries     Family History  Problem Relation Age of Onset  . Colon cancer Maternal Grandfather   . Hypertension Father    Social History  Substance Use Topics  . Smoking status: Light Tobacco Smoker  . Smokeless tobacco: Never Used  . Alcohol Use: No   OB History    Gravida Para Term Preterm AB TAB SAB Ectopic Multiple Living       Review of Systems  Respiratory: Negative for cough and  shortness of breath.   Cardiovascular: Positive for leg swelling. Negative for chest pain.  Musculoskeletal: Positive for myalgias.  Neurological: Negative for weakness and numbness.       -paresthesia   All other systems reviewed and are negative.  Allergies  Bupropion  Home Medications   Prior to Admission medications   Medication Sig Start Date End Date Taking? Authorizing Provider  aspirin 81 MG tablet Take 81 mg by mouth daily.    Historical Provider, MD  divalproex (DEPAKOTE ER) 500 MG 24 hr tablet Take 1 tablet by mouth daily. 02/11/15   Historical Provider, MD  LATUDA 80 MG TABS tablet Take 1 tablet by mouth daily. 01/22/15   Historical Provider, MD  LevOCARNitine (CARNITINE PO) Take by mouth.    Historical Provider, MD  metFORMIN (GLUCOPHAGE-XR) 750 MG 24 hr tablet Take 2 tablets by mouth daily. 01/14/15   Historical Provider, MD  PARAGARD INTRAUTERINE COPPER IUD IUD 1 each by Intrauterine route once.    Historical Provider, MD   BP 138/90 mmHg  Pulse 100  Temp(Src) 98.4 F (36.9 C) (Oral)  Resp 18  Ht  (1.6 m)  Wt 192 lb 8 oz (87.317 kg)  BMI 34.11 kg/m2  SpO2 98%  LMP 10/03/2015 Physical Exam  Constitutional: She appears well-developed and well-nourished. No distress.  HENT:  Head: Normocephalic and atraumatic.  Mouth/Throat: Oropharynx is clear and moist. No oropharyngeal exudate.  Eyes: Conjunctivae and EOM are normal. Pupils are equal, round, and reactive to light.  Neck: Normal range of motion. Neck supple.  Cardiovascular: Normal rate, regular rhythm, normal heart sounds and intact distal pulses.  Exam reveals no gallop and no friction rub.   No murmur heard. Palpable DP pulse on the left.   Pulmonary/Chest: Effort normal and breath sounds normal. No respiratory distress. She has no wheezes. She has no rales.  Abdominal: Soft. Bowel sounds are normal. She exhibits no distension. There is no tenderness.  Musculoskeletal: Normal range of motion. She  exhibits tenderness. She exhibits no edema.  No appreciable LE swelling. BLE appear to be symmetric to each other. Negative Homen's sign. Some left calf tenderness.   Lymphadenopathy:    She has no cervical adenopathy.  Neurological: She is alert. Coordination normal.  Skin: Skin is warm and dry. No rash noted. No erythema.  Psychiatric: She has a normal mood and affect. Her behavior is normal.  Nursing note and vitals reviewed.   ED Course  Procedures (including critical care time) DIAGNOSTIC STUDIES: Oxygen Saturation is 98% on RA, normal by my interpretation.    COORDINATION OF CARE: 9:07 PM Discussed treatment plan with pt at bedside which includes Lovenox, return for vascular study and pt agreed to plan.   Labs Review Labs Reviewed - No data to display  Imaging Review No results found. I have personally reviewed and evaluated these images and lab results as part of my medical decision-making.   EKG Interpretation None       MDM   Final diagnoses:  Left leg pain    25yF with L leg pain. Doubt infectious. Good pulses. NO trauma. Cannot r/o DVT. Cannot obtian US at this hour. Lovenox and have return for venous study. It has been determined that no acute conditions requiring further emergency intervention are present at this time. The patient has been advised of the diagnosis and plan. I reviewed any labs and imaging including any potential incidental findings. We have discussed signs and symptoms that warrant return to the ED and they are listed in the discharge instructions.    I personally preformed the services scribed in my presence. The recorded information has been reviewed is accurate. Raeford RazorStephen Aleesha Ringstad, MD.   Raeford RazorStephen Dare Sanger, MD 11/14/15 [id number]

## 2015-11-02 NOTE — Discharge Instructions (Signed)
Pain Without a Known Cause °WHAT IS PAIN WITHOUT A KNOWN CAUSE? °Pain can occur in any part of the body and can range from mild to severe. Sometimes no cause can be found for why you are having pain. Some types of pain that can occur without a known cause include:  °· Headache. °· Back pain. °· Abdominal pain. °· Neck pain. °HOW IS PAIN WITHOUT A KNOWN CAUSE DIAGNOSED?  °Your health care provider will try to find the cause of your pain. This may include: °· Physical exam. °· Medical history. °· Blood tests. °· Urine tests. °· X-rays. °If no cause is found, your health care provider may diagnose you with pain without a known cause.  °IS THERE TREATMENT FOR PAIN WITHOUT A CAUSE?  °Treatment depends on the kind of pain you have. Your health care provider may prescribe medicines to help relieve your pain.  °WHAT CAN I DO AT HOME FOR MY PAIN?  °· Take medicines only as directed by your health care provider. °· Stop any activities that cause pain. During periods of severe pain, bed rest may help. °· Try to reduce your stress with activities such as yoga or meditation. Talk to your health care provider for other stress-reducing activity recommendations. °· Exercise regularly, if approved by your health care provider. °· Eat a healthy diet that includes fruits and vegetables. This may improve pain. Talk to your health care provider if you have any questions about your diet. °WHAT IF MY PAIN DOES NOT GET BETTER?  °If you have a painful condition and no reason can be found for the pain or the pain gets worse, it is important to follow up with your health care provider. It may be necessary to repeat tests and look further for a possible cause.  °  °This information is not intended to replace advice given to you by your health care provider. Make sure you discuss any questions you have with your health care provider. °  °Document Released: 12/30/2000 Document Revised: 04/27/2014 Document Reviewed: 08/22/2013 °Elsevier  Interactive Patient Education ©2016 [employer] Inc. ° °

## 2015-11-03 ENCOUNTER — Ambulatory Visit (HOSPITAL_COMMUNITY)
Admission: RE | Admit: 2015-11-03 | Discharge: 2015-11-03 | Disposition: A | Discharge status: Home / Self Care | Payer: BLUE CROSS/BLUE SHIELD | Source: Ambulatory Visit | Attending: Emergency Medicine | Admitting: Emergency Medicine

## 2015-11-03 DIAGNOSIS — M79662 Pain in left lower leg: Secondary | ICD-10-CM | POA: Insufficient documentation

## 2015-11-03 DIAGNOSIS — M7989 Other specified soft tissue disorders: Secondary | ICD-10-CM

## 2015-11-03 DIAGNOSIS — M79609 Pain in unspecified limb: Secondary | ICD-10-CM

## 2015-11-03 NOTE — Progress Notes (Addendum)
VASCULAR LAB PRELIMINARY  PRELIMINARY  PRELIMINARY  PRELIMINARY  Left lower extremity venous duplex has been completed.    Left:  No evidence of DVT, superficial thrombosis, or Baker's cyst.   Sheri [name], RVT, RDMS 11/03/2015, 12:44 PM

## 2015-11-03 NOTE — Progress Notes (Signed)
Chief Complaint  Patient presents with  . sternal pain in back    Pt states that she has pain everywhere. Pt's left leg constantly aches and sometimes it gets worse all of a sudden.    HPI: Sheri [name] 25 y.o.  Hx of bipolar pcos and VTE and tobacco who comesin todfay  Initial and only visit was 10 16   ,acute new sx Here with companion co of  "Weird pains and uncertain if improtant " need help with splution  Solution .  Left pain   Neg dvt in ed over the   . Over left calf   Ok no sob   Onset with bad pain mid  Lower thoracic midline area  That had to pop back and nausea and  Under sternum  High epigastric area  That feels and take breath away  And then left pain and left neck pain.   Over left shoulder   Anxiety with body pain .   A little   A bit augmented .      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   Still sees psych  ROS: See pertinent positives and negatives per HPI. No fever chills hemoptysis   Arms numb when sleep at tnight at times no weakness in day  Past Medical History  Diagnosis Date  . Alcohol addiction (HCC)   . Drug addiction (HCC)   . Depression   . Pulmonary embolism (HCC)     neg heme evaluation  felt to be from ocps   . Bipolar depression (HCC)     under psych rx   . Drug addiction in remission [facility])     heroin    Family History  Problem Relation Age of Onset  . Colon cancer Maternal Grandfather   . Hypertension Father     Social History   Social History  . Marital Status: Single    Spouse Name: N/A  . Number of Children: N/A  . Years of Education: N/A   Occupational History  . nanny    Social History Main Topics  . Smoking status: Light Tobacco Smoker  . Smokeless tobacco: Never Used  . Alcohol Use: No  . Drug Use: No  .  Sexual Activity:    Partners: Male   Other Topics Concern  . 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 aspirin 81 MG tablet Take 81 mg by mouth daily.    Marland Kitchen PARAGARD INTRAUTERINE COPPER IUD IUD 1 each by Intrauterine route once.    . divalproex (DEPAKOTE ER) 500 MG 24 hr tablet Take 1 tablet by mouth daily. Reported on 11/04/2015    . LATUDA 80 MG TABS tablet Take 1 tablet by mouth daily. Reported on 11/04/2015    . LevOCARNitine (CARNITINE PO)  Take by mouth. Reported on 11/04/2015    . metFORMIN (GLUCOPHAGE-XR) 750 MG 24 hr tablet Take 2 tablets by mouth daily. Reported on 11/04/2015     No facility-administered medications prior to visit.     EXAM:  BP 140/90 mmHg  Pulse 89  Temp(Src) 98 F (36.7 C) (Oral)  Ht 5\' 3"  (1.6 m)  Wt 192 lb 2 oz (87.147 kg)  BMI 34.04 kg/m2  SpO2 98%  LMP 10/03/2015  Body mass index is 34.04 kg/(m^2).  GENERAL: vitals reviewed and listed above, alert, oriented, appears well hydrated and in no acute distress mild anxiety looks well  HEENT: atraumatic, conjunctiva  clear, no obvious abnormalities on inspection of external nose and ears OP : no lesion edema or exudate  NECK: no obvious masses on inspection palpation  Tense scm and trapezious full rom no masses  LUNGS: clear to auscultation bilaterally, no wheezes, rales or rhonchi, good air movement Area on back  Mid line thorac  CV: HRRR, no clubbing cyanosis or  peripheral edema nl cap refill  Abdomen:  Sof,t normal bowel sounds without hepatosplenomegaly, no guarding rebound or masses no CVA tenderness  MS: moves all extremities without noticeable focal  Abnormality no swelling    Tender area left calf no edema  PSYCH: pleasant and cooperative, no obvious depression  midl y anxious    ASSESSMENT AND PLAN:  Discussed the following assessment and plan:  Leg pain, posterior, left  Neck pain on right side  Epigastric abdominal pain  Midline thoracic back pain  Hx of pulmonary embolus  PCOS (polycystic ovarian syndrome)  Does not have health insurance Multiple complaints all seem to be with occasional exception musculoskeletal and probably mechanical. Discussed taking care of a heavy 3543-month-old may be contributing. Exercises PT possibly yoga may be helpful. I don't think she has cardiac or signs of a pulmonary embolus today. Her high epigastric pain could be GI or reflux into take Prilosec every day for about 2 weeks. She may benefit from counseling because of secondary anxiety. Would have her plan for his CPX with labs although she can wait until she is able to get back on insurance which may not be until January. Discussed body mechanics techniques of getting an infant child to sleep without being held etc. reheck with alarm sx  Progressive etc  I can order her metformin she can check on this but 500 extended release 2-3 a day may be less expensive than the 750. Her OB/GYN was giving her this prescription but I feel comfortable prescribing them for her at this time. -Patient advised to return or notify health care team  if symptoms worsen ,persist or new concerns arise.  Patient Instructions  Many of your sx seem Musculo skeletal  Related . Stretch exercise may help  You  And   Try prilosec once a day for at least 2 weeks for the  Stomach  Area .   Consider counseling in future about anxiety augmentation of your sx.   Let us know if  persistent or progressive .     Neta MendsWanda K. Panosh M.D.

## 2015-11-04 ENCOUNTER — Ambulatory Visit (INDEPENDENT_AMBULATORY_CARE_PROVIDER_SITE_OTHER): Payer: Self-pay | Admitting: Internal Medicine

## 2015-11-04 ENCOUNTER — Encounter: Payer: Self-pay | Admitting: Internal Medicine

## 2015-11-04 ENCOUNTER — Telehealth: Payer: Self-pay | Admitting: Emergency Medicine

## 2015-11-04 ENCOUNTER — Ambulatory Visit (HOSPITAL_COMMUNITY): Admission: RE | Admit: 2015-11-04 | Payer: BLUE CROSS/BLUE SHIELD | Source: Ambulatory Visit

## 2015-11-04 VITALS — BP 140/90 | HR 89 | Temp 98.0°F | Ht 63.0 in | Wt 192.1 lb

## 2015-11-04 DIAGNOSIS — M79602 Pain in left arm: Secondary | ICD-10-CM

## 2015-11-04 DIAGNOSIS — Z86711 Personal history of pulmonary embolism: Secondary | ICD-10-CM

## 2015-11-04 DIAGNOSIS — M79605 Pain in left leg: Secondary | ICD-10-CM

## 2015-11-04 DIAGNOSIS — Z598 Other problems related to housing and economic circumstances: Secondary | ICD-10-CM

## 2015-11-04 DIAGNOSIS — Z5989 Other problems related to housing and economic circumstances: Secondary | ICD-10-CM

## 2015-11-04 DIAGNOSIS — M546 Pain in thoracic spine: Secondary | ICD-10-CM

## 2015-11-04 DIAGNOSIS — R1013 Epigastric pain: Secondary | ICD-10-CM

## 2015-11-04 DIAGNOSIS — M542 Cervicalgia: Secondary | ICD-10-CM

## 2015-11-04 DIAGNOSIS — E282 Polycystic ovarian syndrome: Secondary | ICD-10-CM

## 2015-11-04 NOTE — Patient Instructions (Signed)
Many of your sx seem Musculo skeletal  Related . Stretch exercise may help  You  And   Try prilosec once a day for at least 2 weeks for the  Stomach  Area .   Consider counseling in future about anxiety augmentation of your sx.   Let us know if  persistent or progressive .

## 2015-11-04 NOTE — Telephone Encounter (Signed)
Patient seen in office by Dr. Fabian Sharp on 11/04/15.     PLEASE NOTE: All timestamps contained within this report are represented as [place] Standard Time. CONFIDENTIALTY NOTICE: This fax transmission is intended only for the addressee. It contains information that is legally privileged, confidential or otherwise protected from use or disclosure. If you are not the intended recipient, you are strictly prohibited from reviewing, disclosing, copying using or disseminating any of this information or taking any action in reliance on or regarding this information. If you have received this fax in error, please notify us immediately by telephone so that we can arrange for its return to [name]. Phone: [phone], Toll-Free: [phone], Fax: [phone] Page: 1 of 2 Call Id: [id number] [facility] Night - Client TELEPHONE ADVICE RECORD [name] Patient Name: Sheri [name] Gender: Female DOB: 05/09/1990 Age: 25 Y 5 M 14 D Return Phone Number: [phone] (Primary), [phone] (Secondary) [place] [address] Client [facility] Night - Client Client Site [facility] - Night Physician Berniece Andreas - MD Contact Type Call Who Is Calling Patient / Member / Family / Caregiver Call Type Triage / Clinical Relationship To Patient Self Return Phone Number [phone] (Primary) Chief Complaint Vaginal Bleeding Reason for Call Symptomatic / Request for Health Information Initial Comment Caller states symptoms of a DVT. Hx blood clots. PreDisposition Call another nurse Translation No Nurse Assessment Nurse: Anner Crete, RN, Massie Bougie Date/Time (Eastern Time): 11/02/2015 6:02:44 PM Confirm and document reason for call. If symptomatic, describe symptoms. You must click the next button to save text entered. ---Caller states that she is having pain behind her knee and in her calf with bruising and some  swelling. No injury that she knows of. She has a history of blood clots. She has been on a long car ride today about 12 hours total. Has the patient traveled out of the country within the last 30 days? ---Not Applicable Does the patient have any new or worsening symptoms? ---Yes Will a triage be completed? ---Yes Related visit to physician within the last 2 weeks? ---No Does the PT have any chronic conditions? (i.e. diabetes, asthma, etc.) ---Yes List chronic conditions. ---history of blood clots, PCOS Is the patient pregnant or possibly pregnant? (Ask all females between the ages of 64-55) ---No Is this a behavioral health or substance abuse call? ---No Guidelines Guideline Title Affirmed Question Affirmed Notes Nurse Date/Time Lamount Cohen Time) Leg Pain [1] Thigh or calf pain AND [2] only 1 side AND [3] present > 1 hour Wells, RN, Belinda 11/02/2015 6:05:48 PM Disp. Time Lamount Cohen Time) Disposition Final User PLEASE NOTE: All timestamps contained within this report are represented as [place] Standard Time. CONFIDENTIALTY NOTICE: This fax transmission is intended only for the addressee. It contains information that is legally privileged, confidential or otherwise protected from use or disclosure. If you are not the intended recipient, you are strictly prohibited from reviewing, disclosing, copying using or disseminating any of this information or taking any action in reliance on or regarding this information. If you have received this fax in error, please notify us immediately by telephone so that we can arrange for its return to [name]. Phone: [phone], Toll-Free: [phone], Fax: [phone] Page: 2 of 2 Call Id: [id number] 11/02/2015 6:08:56 PM See Physician within 4 Hours (or PCP triage) Yes Anner Crete, RN, Baltazar Apo Understands: Yes Disagree/Comply: Comply Care Advice Given Per Guideline SEE PHYSICIAN WITHIN 4 HOURS (or PCP triage): * IF OFFICE WILL BE OPEN: You  need to be seen  within the next 3 or 4 hours. Call your doctor's office now or as soon as it opens. * IF OFFICE WILL BE CLOSED AND NO PCP TRIAGE: You need to be seen within the next 3 or 4 hours. A nearby [name] is often a good source of care. Another choice is to go to the ER. Go sooner if you become worse. CALL EMS IF: You develop any chest pain or shortness of breath. CARE ADVICE given per Leg Pain (Adult) guideline. Referrals [name]

## 2015-11-04 NOTE — Progress Notes (Signed)
Pre visit review using our clinic review tool, if applicable. No additional management support is needed unless otherwise documented below in the visit note. 

## 2015-12-30 ENCOUNTER — Encounter: Payer: Self-pay | Admitting: Internal Medicine

## 2015-12-31 ENCOUNTER — Encounter: Payer: Self-pay | Admitting: Internal Medicine

## 2015-12-31 ENCOUNTER — Ambulatory Visit (INDEPENDENT_AMBULATORY_CARE_PROVIDER_SITE_OTHER)
Admission: RE | Admit: 2015-12-31 | Discharge: 2015-12-31 | Disposition: A | Discharge status: Home / Self Care | Payer: Self-pay | Source: Ambulatory Visit | Attending: Internal Medicine | Admitting: Internal Medicine

## 2015-12-31 ENCOUNTER — Ambulatory Visit (INDEPENDENT_AMBULATORY_CARE_PROVIDER_SITE_OTHER): Payer: Self-pay | Admitting: Internal Medicine

## 2015-12-31 VITALS — BP 116/70 | Temp 98.2°F | Wt 190.4 lb

## 2015-12-31 DIAGNOSIS — R079 Chest pain, unspecified: Secondary | ICD-10-CM

## 2015-12-31 DIAGNOSIS — R208 Other disturbances of skin sensation: Secondary | ICD-10-CM

## 2015-12-31 DIAGNOSIS — M792 Neuralgia and neuritis, unspecified: Secondary | ICD-10-CM

## 2015-12-31 DIAGNOSIS — Z5989 Other problems related to housing and economic circumstances: Secondary | ICD-10-CM

## 2015-12-31 DIAGNOSIS — Z598 Other problems related to housing and economic circumstances: Secondary | ICD-10-CM

## 2015-12-31 DIAGNOSIS — R200 Anesthesia of skin: Secondary | ICD-10-CM

## 2015-12-31 LAB — BASIC METABOLIC PANEL
BUN: 10 mg/dL (ref 6–23)
CHLORIDE: 105 meq/L (ref 96–112)
CO2: 29 meq/L (ref 19–32)
Calcium: 9.1 mg/dL (ref 8.4–10.5)
Creatinine, Ser: 0.69 mg/dL (ref 0.40–1.20)
GFR: 108.79 mL/min (ref >60.00)
Glucose, Bld: 87 mg/dL (ref 70–99)
POTASSIUM: 4 meq/L (ref 3.5–5.1)
Sodium: 137 mEq/L (ref 135–145)

## 2015-12-31 LAB — CBC WITH DIFFERENTIAL/PLATELET
BASOS ABS: 0 10*3/uL (ref 0.0–0.1)
BASOS PCT: 0.4 % (ref 0.0–3.0)
EOS ABS: 0.2 10*3/uL (ref 0.0–0.7)
Eosinophils Relative: 2 % (ref 0.0–5.0)
HEMATOCRIT: 38.5 % (ref 36.0–46.0)
HEMOGLOBIN: 12.8 g/dL (ref 12.0–15.0)
LYMPHS PCT: 19.6 % (ref 12.0–46.0)
Lymphs Abs: 1.9 10*3/uL (ref 0.7–4.0)
MCHC: 33.3 g/dL (ref 30.0–36.0)
MCV: 87.1 fl (ref 78.0–100.0)
Monocytes Absolute: 0.8 10*3/uL (ref 0.1–1.0)
Monocytes Relative: 7.8 % (ref 3.0–12.0)
Neutro Abs: 6.8 10*3/uL (ref 1.4–7.7)
Neutrophils Relative %: 70.2 % (ref 43.0–77.0)
Platelets: 338 10*3/uL (ref 150.0–400.0)
RBC: 4.42 Mil/uL (ref 3.87–5.11)
RDW: 13.1 % (ref 11.5–15.5)
WBC: 9.7 10*3/uL (ref 4.0–10.5)

## 2015-12-31 MED ORDER — GABAPENTIN 100 MG PO CAPS: 100.0000 mg | ORAL_CAPSULE | Freq: Three times a day (TID) | ORAL | 3 refills | Status: DC

## 2015-12-31 MED ORDER — PREDNISONE 10 MG PO TABS: ORAL_TABLET | ORAL | 0 refills | Status: DC

## 2015-12-31 NOTE — Progress Notes (Signed)
Pre visit review using our clinic review tool, if applicable. No additional management support is needed unless otherwise documented below in the visit note.  Chief Complaint  Patient presents with  . Left Side Rib Pain and Shoulder Pain  . Left Side Calf Pain    HPI: Sheri [name] 25 y.o.   sda    See last  Assessment and patient message  Ongoing sx  See past .  Pain is burning and now more  And worse at night when lays down  Left neck shoulder and numbness down left arm  sensitive to touch at times    Has stable sob no cough and doesn't feel like when she had  Pe 5 years ago on  ocps .  Still has no insurance   Yet plans  When enrollment comes around again  tryin gstretching  Other   Leg pain, posterior, left  Neck pain on right side  Epigastric abdominal pain  Midline thoracic back pain  Hx of pulmonary embolus  PCOS (polycystic ovarian syndrome)  Does not have health insurance Multiple complaints all seem to be with occasional exception musculoskeletal and probably mechanical. Discussed taking care of a heavy 65-month-old may be contributing. Exercises PT possibly yoga may be helpful. I don't think she has cardiac or signs of a pulmonary embolus today. Her high epigastric pain could be GI or reflux into take Prilosec every day for about 2 weeks. She may benefit from counseling because of secondary anxiety. Would have her plan for his CPX with labs although she can wait until she is able to get back on insurance which may not be until January. Discussed body mechanics techniques of getting an infant child to sleep without being held etc. reheck with alarm sx  Progressive etc  I can order her metformin she can check on this but 500 extended release 2-3 a day may be less expensive than the 750. Her OB/GYN was giving her this prescription but I feel comfortable prescribing them for her at this time. -Patient advised to return or notify health care team  if symptoms  worsen ,persist or new concerns arise.  Patient Instructions  Many of your sx seem Musculo skeletal  Related . Stretch exercise may help  You  And   Try prilosec once a day for at least 2 weeks for the  Stomach  Area .   Consider counseling in future about anxiety augmentation of your sx.   Let us know if  persistent or progressive .     Neta Mends. Tahjae Clausing M.D.  Hello, I wanted to consult you about some of the discomfort I am continuing to experience since my last visit. I have found some relief from things we had discussed but some issues have intensified to the point that it is interfering with all aspects of my life.  -left shoulder has continued to feel a pressure that moves into my neck and face causing tingling sensations  -Left calf has continued to have muscle spasms and pain  Both combined are causing a "falling asleep" sensation, especially at night  -an intense burning pain around my left rib cage and into back. The area is also tender to the touch and subject to increase if I move certain ways. This is the most frustrating of the pain.  -pneumonia feeling when taking deep breaths   I hope that maybe you could give me some guidance since I still do not have insurance till the new year. But  I am willing to make an appointment, I just want relief.  Thank you   ROS: See pertinent positives and negatives per HPI.  Past Medical History:  Diagnosis Date  . Alcohol addiction (HCC)   . Bipolar depression (HCC)    under psych rx   . Depression   . Drug addiction (HCC)   . Drug addiction in remission [facility](HCC)    heroin  . Pulmonary embolism (HCC)    neg heme evaluation  felt to be from ocps     Family History  Problem Relation Age of Onset  . Colon cancer Maternal Grandfather   . Hypertension Father     Social History   Social History  . Marital status: Single    Spouse name: N/A  . Number of children: N/A  . Years of education: N/A   Occupational History  . nanny     Social History Main Topics  . Smoking status: Light Tobacco Smoker  . Smokeless tobacco: Never Used  . Alcohol use No  . Drug use: No  . Sexual activity: Not Currently    Partners: Male   Other Topics Concern  . 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 aspirin 81 MG tablet Take 81 mg by mouth daily.    Marland Kitchen. PARAGARD INTRAUTERINE COPPER IUD IUD 1 each by Intrauterine route once.     No facility-administered medications prior to visit.      EXAM:  BP 116/70 (BP Location: Right Arm, Patient Position: Sitting, Cuff Size: Normal)   Temp 98.2 F (36.8 C) (Oral)   Wt 190 lb 6.4 oz (86.4 kg)   BMI 33.73 kg/m   Body mass index is 33.73 kg/m.  GENERAL: vitals reviewed and listed above, alert, oriented, appears well hydrated and in no acute distress mild pain  No toxic    HEENT: atraumatic, conjunctiva  clear, no obvious abnormalities on inspection of external nose and ears OP : no lesion edema or exudate  NECK: no obvious masses on inspection palpation  rom ok but some inc pain when rotation right and extension left arm   LUNGS: clear to auscultation bilaterally, no wheezes, rales or rhonchi, good air movement CV: HRRR, no clubbing cyanosis or  peripheral edema nl cap refill  Abdomen:  Sof,t normal bowel sounds without hepatosplenomegaly, no guarding rebound or masses no CVA tenderness  Neuro brisk 3+ reflexes   Strength seems ok  No [place] masses  No fasciculation   No clonus  MS: moves all extremities without noticeable focal  abnormality PSYCH: pleasant and cooperative, no obvious depression or anxiety stressed but  appropriate   ASSESSMENT AND PLAN:  Discussed  the following assessment and plan:  Radicular pain in left arm - Plan: CBC with Differential/Platelet, Basic metabolic panel, DG Chest 2 View  Left sided chest pain - Plan: CBC with Differential/Platelet, Basic metabolic panel, DG Chest 2 View  Left arm numbness - Plan: CBC with Differential/Platelet, Basic metabolic panel, DG Chest 2 View  Does not have health insurance Doppler  Neg lef leg  On  July when began  Lab and cxray       Sounds like radicular or other sx and consider ation of neuro  Other referral and imaging .  For now pred and gabapentin but increasing dose  c xry   And contact us in 10 - 14 days  Of  Weakness progression need  Further evaluation .  This doesn't sound like  PE as cause of sx  Today and patient says this feels different.  Had eval  ocps felt to be the trigger  and no other underlying cause found .  Says has burning area of left  Groin and leg at times    No obv weakness  -Patient advised to return or notify health care team  if symptoms worsen ,persist or new concerns arise.  Patient Instructions  Get chest xray lab   And trial of prednisone and  Gabapentin  For pain  This acts like a pinched nerver in your neck or there about  But if getting worse in chest may need to check other causes . Consider neck imaging other if needed and progressive   To see neurologist if not getting better or progressing .     Neta Mends. Aunica Dauphinee M.D.

## 2015-12-31 NOTE — Patient Instructions (Addendum)
Get chest xray lab   And trial of prednisone and  Gabapentin  For pain  This acts like a pinched nerver in your neck or there about  But if getting worse in chest may need to check other causes . Consider neck imaging other if needed and progressive   To see neurologist if not getting better or progressing .

## 2016-02-17 ENCOUNTER — Other Ambulatory Visit: Payer: Self-pay | Admitting: Internal Medicine

## 2016-02-18 ENCOUNTER — Encounter (HOSPITAL_COMMUNITY): Payer: Self-pay | Admitting: Obstetrics and Gynecology

## 2016-02-19 NOTE — Telephone Encounter (Signed)
I spoke to the pt.  She is having another flare of shoulder and arm pain along with numbness like before.  She can not take the gabepentin as she is recovering from drugs.  Stated the prednisone did bring some relief.  Also mentioned that she was seen at urgent care a month ago and diagnosed with gastritis.  Has been taking Nexium with some relief.  Wanted to know what else she could do.  Advised that she come and speak with Dr. Fabian SharpPanosh about arm and shoulder pain along with stomach upset.  Appt scheduled for 02/21/16 @ 1:45 PM.  Will check on refill.

## 2016-02-19 NOTE — Progress Notes (Signed)
Chief Complaint  Patient presents with  . Follow-up    HPI: Sheri [name] 25 y.o.  comes in for fu  recurrence of sx and has uri . Prednisone helped in th past   Left arm trap burning pain and tingling numbness in left hand off and on    pred gave great relief last time      Lots of relief. burnding and pain and left shoulder and left  Side of head and jaw and   Right side  ocass groin.  > dropping things biut ? Weakness . No fallen ,   No vertigo  No change in hearing .  Went to [facility] for Ruq riob pain and given  nexium ? Gastritis and said if not better see PCP  Minimally better not worse with cough or eating no vomiting  abd changes .   Has IUD and  Every other month period  On paraguard .  No sob    Last week     arn neck sx got worse  [name]  age 22 [name] .   24  School January . [employer]  Beginning  Part time.    In January  Live with bf and 5 [name] .    2 days  Congestion .    Still no insurance to meet with navigator today about getting insurance for next year ( marketplace) ROS: See pertinent positives and negatives per HPI. No cp sob  Fever has head congestion  Like a cold today   Past Medical History:  Diagnosis Date  . Alcohol addiction (HCC)   . Bipolar depression (HCC)    under psych rx   . Depression   . Drug addiction (HCC)   . Drug addiction in remission [facility](HCC)    heroin  . Pulmonary embolism (HCC)    neg heme evaluation  felt to be from ocps     Family History  Problem Relation Age of Onset  . Colon cancer Maternal Grandfather   . Hypertension Father     Social History   Social History  . Marital status: Single    Spouse name: N/A  . Number of children: N/A  . [name] of education: N/A   Occupational History  . [employer]    Social History Main Topics  . Smoking status: Light Tobacco Smoker  . Smokeless tobacco: Never Used  . Alcohol use No  . Drug use: No  . Sexual activity: Not Currently    Partners: Male   Other Topics Concern  . 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 aspirin 81 MG tablet Take 81 mg by mouth daily.    Marland Kitchen. PARAGARD INTRAUTERINE COPPER IUD IUD 1 each by Intrauterine route once.    . gabapentin (NEURONTIN) 100 MG capsule Take 1 capsule (100 mg total) by mouth 3 (three) times daily. Increase to 3 po tid as tolerated 90 capsule 3  . predniSONE (DELTASONE) 10 MG tablet Take pills per day,6,6,6,4,4,4,2,2,2,1,1,1 (Patient not taking: Reported on 02/21/2016) 40 tablet 0   No facility-administered  medications prior to visit.      EXAM:  BP 116/70 (BP Location: Right Arm, Patient Position: Sitting, Cuff Size: Normal)   Pulse 82   Temp 98 F (36.7 C) (Oral)   Wt 191 lb 12.8 oz (87 kg)   SpO2 98%   BMI 33.98 kg/m   Body mass index is 33.98 kg/m.  GENERAL: vitals reviewed and listed above, alert, oriented, appears well hydrated and in no acute distress mild congestion   Non toxic  HEENT: atraumatic, conjunctiva  clear, no obvious abnormalities on inspection of external nose and ears tmx clear OP : no lesion edema or exudate  NECK: no obvious masses on inspection palpation   Left brachial plexus spread and sx    LUNGS: clear to auscultation bilaterally, no wheezes, rales or rhonchi, good air movement CV: HRRR, no clubbing cyanosis or  peripheral edema nl cap refill  MS: moves all extremities without noticeable focal  Abnormality   NEURO: oriented x 3 CN 3-12 appear intact. No focal muscle weakness or atrophy. DTRs symmetrical. Gait WNL.  Grossly non focal. No tremor or abnormal movement. Abdomen:  Sof,t normal bowel sounds without hepatosplenomegaly, no guarding rebound or masses no CVA  tenderness left mid line t 10 tender almost point area  jsut under rib cage no masses and no rib crepitus   PSYCH: pleasant and cooperative, no obvious depression or anxiety  ASSESSMENT AND PLAN:  Discussed the following assessment and plan:  Radicular pain in left arm  Abdominal pain, left upper quadrant vx cwp.   Medication management  Does not have health insurance - looking at marketplace Got very good relief with pred last time  ? If autoimmunize vs   neuropathic     Risk benefit of medication discussed. Reasonable to re course .    Not sure what to make of the  Other pain  Would wean off nexium based on hx  And see if pred helps this pain .  ? If cp vs other ( she says doesn't feel like lung pain)  Plans on getting glu vaccine at [facility]   20 $ for  her -Patient advised to return or notify health care team  if symptoms worsen ,persist or new concerns arise.  Patient Instructions  Decrease the Nexium to every other day. Then wean off of the Nexium. We will retreat with prednisone and see what the arm and the rib cage pain does. Advise we get further evaluation for the arm pain and numbness.  Sports/ med neuro  Sometimes there are   arthritis conditions that  Can cause sx that response to  Prednisone.   Also consider blood tests  .  If you get shortness of breath pleurisy symptoms seek immediate care.        Neta MendsWanda K. Noah [name] M.D.

## 2016-02-21 ENCOUNTER — Ambulatory Visit (INDEPENDENT_AMBULATORY_CARE_PROVIDER_SITE_OTHER): Payer: Self-pay | Admitting: Internal Medicine

## 2016-02-21 ENCOUNTER — Encounter: Payer: Self-pay | Admitting: Internal Medicine

## 2016-02-21 VITALS — BP 116/70 | HR 82 | Temp 98.0°F | Wt 191.8 lb

## 2016-02-21 DIAGNOSIS — Z598 Other problems related to housing and economic circumstances: Secondary | ICD-10-CM

## 2016-02-21 DIAGNOSIS — Z79899 Other long term (current) drug therapy: Secondary | ICD-10-CM

## 2016-02-21 DIAGNOSIS — M792 Neuralgia and neuritis, unspecified: Secondary | ICD-10-CM

## 2016-02-21 DIAGNOSIS — Z5971 Insufficient health insurance coverage: Secondary | ICD-10-CM

## 2016-02-21 DIAGNOSIS — Z5989 Other problems related to housing and economic circumstances: Secondary | ICD-10-CM

## 2016-02-21 DIAGNOSIS — R1012 Left upper quadrant pain: Secondary | ICD-10-CM

## 2016-02-21 MED ORDER — PREDNISONE 10 MG PO TABS: ORAL_TABLET | ORAL | 0 refills | Status: DC

## 2016-02-21 NOTE — Patient Instructions (Addendum)
Decrease the Nexium to every other day. Then wean off of the Nexium. We will retreat with prednisone and see what the arm and the rib cage pain does. Advise we get further evaluation for the arm pain and numbness.  Sports/ med neuro  Sometimes there are   arthritis conditions that  Can cause sx that response to  Prednisone.   Also consider blood tests  .  If you get shortness of breath pleurisy symptoms seek immediate care.

## 2016-04-21 ENCOUNTER — Other Ambulatory Visit (INDEPENDENT_AMBULATORY_CARE_PROVIDER_SITE_OTHER): Payer: BLUE CROSS/BLUE SHIELD

## 2016-04-21 DIAGNOSIS — Z0000 Encounter for general adult medical examination without abnormal findings: Secondary | ICD-10-CM

## 2016-04-21 LAB — HEPATIC FUNCTION PANEL
ALBUMIN: 4.2 g/dL (ref 3.5–5.2)
ALK PHOS: 46 U/L (ref 39–117)
ALT: 13 U/L (ref 0–35)
AST: 11 U/L (ref 0–37)
Bilirubin, Direct: 0.1 mg/dL (ref 0.0–0.3)
TOTAL PROTEIN: 7 g/dL (ref 6.0–8.3)
Total Bilirubin: 0.3 mg/dL (ref 0.2–1.2)

## 2016-04-21 LAB — LIPID PANEL
CHOLESTEROL: 196 mg/dL (ref 0–200)
HDL: 47.8 mg/dL (ref >39.00)
LDL Cholesterol: 115 mg/dL — ABNORMAL HIGH (ref 0–99)
NonHDL: 148.26
Total CHOL/HDL Ratio: 4
Triglycerides: 164 mg/dL — ABNORMAL HIGH (ref 0.0–149.0)
VLDL: 32.8 mg/dL (ref 0.0–40.0)

## 2016-04-21 LAB — BASIC METABOLIC PANEL
BUN: 12 mg/dL (ref 6–23)
CO2: 29 meq/L (ref 19–32)
Calcium: 9.5 mg/dL (ref 8.4–10.5)
Chloride: 103 mEq/L (ref 96–112)
Creatinine, Ser: 0.74 mg/dL (ref 0.40–1.20)
GFR: 100.12 mL/min (ref >60.00)
GLUCOSE: 115 mg/dL — AB (ref 70–99)
POTASSIUM: 3.9 meq/L (ref 3.5–5.1)
SODIUM: 139 meq/L (ref 135–145)

## 2016-04-21 LAB — CBC WITH DIFFERENTIAL/PLATELET
BASOS ABS: 0 10*3/uL (ref 0.0–0.1)
Basophils Relative: 0.4 % (ref 0.0–3.0)
EOS PCT: 3 % (ref 0.0–5.0)
Eosinophils Absolute: 0.2 10*3/uL (ref 0.0–0.7)
HEMATOCRIT: 37.1 % (ref 36.0–46.0)
Hemoglobin: 12.9 g/dL (ref 12.0–15.0)
LYMPHS ABS: 1.5 10*3/uL (ref 0.7–4.0)
LYMPHS PCT: 20.2 % (ref 12.0–46.0)
MCHC: 34.9 g/dL (ref 30.0–36.0)
MCV: 84.4 fl (ref 78.0–100.0)
MONOS PCT: 7.4 % (ref 3.0–12.0)
Monocytes Absolute: 0.5 10*3/uL (ref 0.1–1.0)
NEUTROS ABS: 5 10*3/uL (ref 1.4–7.7)
NEUTROS PCT: 69 % (ref 43.0–77.0)
Platelets: 311 10*3/uL (ref 150.0–400.0)
RBC: 4.39 Mil/uL (ref 3.87–5.11)
RDW: 13.4 % (ref 11.5–15.5)
WBC: 7.2 10*3/uL (ref 4.0–10.5)

## 2016-04-21 LAB — TSH: TSH: 2.44 u[IU]/mL (ref 0.35–4.50)

## 2016-04-24 NOTE — Progress Notes (Signed)
Pre visit review using our clinic review tool, if applicable. No additional management support is needed unless otherwise documented below in the visit note.  Chief Complaint  Patient presents with  . Annual Exam    HPI: Patient  Sheri [name]  26 y.o. comes in today for Preventive Health Care visit  But also follow-up on her left-sided pain arm tingling and neck pain. She also carries a.diagnosis of PCO S was on metformin while back to GYN. Her laboratory tests available today were nonfasting she had a sandwich  about a half hour before the blood work. She continues to work as a Copywriter, advertising. With a 31-monthold toddler. She states that the left-sided upper abdomen lower chest pain is getting worse over the last week or so with no cough. She still has the continued left-sided arm and leg symptoms. She is a new problem which is a rash that she relates to beginning melatonin. She stopped the melatonin last week the rash is fading on Benadryl and her mom gave her some anti-yeast medicine to use on the anterior chest. There is itching and scratching in her sleep. No fevers cough or shortness of breath. She has an IUD has a period about every other month. She just obtained health insurance this year. Of note her symptoms got better on prednisone in the past but perhaps not the left side pain.  Health Maintenance  Topic Date Due  . TETANUS/TDAP  05/21/2008  . HIV Screening  09/17/2016 (Originally 05/21/2004)  . PAP SMEAR  12/03/2017  . INFLUENZA VACCINE  Completed   Health Maintenance Review LIFESTYLE:  Exercise:   Busy toddler  Tobacco/ETS:n Alcohol:  n Sugar beverages: no Sleep: melatonin  7 hours some  Drug use: no HH of 1.5 Work: 40 hours    Child care .     ROS:  See hpi  GEN/ HEENT: No fever, significant weight changes sweats headaches vision problems hearing changes, CV/ PULM; No chest pain shortness of breath cough, syncope,edema  change in exercise  tolerance. GI /GU: No adominal pain, vomiting, change in bowel habits. No blood in the stool. No significant GU symptoms. SKIN/HEME: ,no acute skin rashes suspicious lesions or bleeding. No lymphadenopathy, nodules, masses.  NEURO/ PSYCH:  No neurologic signs such as weakness numbness. No depression anxiety. IMM/ Allergy: No unusual infections.  Allergy .   REST of 12 system review negative except as per HPI   Past Medical History:  Diagnosis Date  . [place]   . [place] ([facility]    under psych rx   . Depression   . [place]   . Drug addiction in [facility] ([facility]    heroin  . [place]   neg heme evaluation  felt to be from ocps     Past Surgical History:  Procedure Laterality Date  . NO PAST SURGERIES      Family History  Problem Relation Age of Onset  . Colon cancer Maternal Grandfather   . Hypertension Father     Social History   Social History  . Marital status: Single    Spouse name: N/A  . Number of children: N/A  . Years of education: N/A   Occupational History  . nanny    Social History Main Topics  . Smoking status: Light Tobacco Smoker  . Smokeless tobacco: Never Used  . Alcohol use No  . Drug use: No  . Sexual activity: Not Currently    Partners:  Male   Other Topics Concern  . 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 aspirin 81 MG tablet Take 81 mg by mouth daily.    Marland Kitchen PARAGARD INTRAUTERINE COPPER IUD IUD 1 each by Intrauterine route once.    Marland Kitchen esomeprazole (NEXIUM) 20 MG capsule Take 40 mg by mouth daily at 12 noon.    . predniSONE (DELTASONE) 10 MG tablet Take pills per  day,6,6,6,4,4,4,2,2,2,1,1,1 40 tablet 0   No facility-administered medications prior to visit.      EXAM:  BP 124/78 (BP Location: Right Arm, Patient Position: Sitting, Cuff Size: Normal)   Temp 98.2 F (36.8 C) (Oral)   Ht '5\' 4"'  (1.626 m)   Wt 194 lb (88 kg)   LMP 03/17/2016 Comment: Not having monthly periods due to IUD  BMI 33.30 kg/m   Body mass index is 33.3 kg/m.  Physical Exam: Vital signs reviewed ITG:PQDI is a well-developed well-nourished alert cooperative    who appearsr stated age in no acute distress.  HEENT: normocephalic atraumatic , Eyes: PERRL EOM's full, conjunctiva clear, Nares: paten,t no deformity discharge or tenderness., Ears: no deformity EAC's clear TMs with normal landmarks. Mouth: clear OP, no lesions, edema.  Moist mucous membranes. Dentition in adequate repair. NECK: supple without masses, thyromegaly or bruits. CHEST/PULM:  Clear to auscultation and percussion breath sounds equal no wheeze , rales or rhonchi. No chest wall deformities or tenderness. CV: PMI is nondisplaced, S1 S2 no gallops, murmurs, rubs. Peripheral pulses are full without delay.No JVD .  ABDOMEN: Bowel sounds normal nontender  No guard or rebound, no hepato splenomegal no CVA tenderness.  No hernia. Extremtities:  No clubbing cyanosis or edema, no acute joint swelling or redness no focal atrophy NEURO:  Oriented x3, cranial nerves 3-12 appear to be intact, no obvious focal weakness,gait within normal limits no abnormal reflexes or asymmetrical SKIN:  normal turgor, color, no bruising or petechiae. There are blotchy papular rash scattered through out her trunk. Left breast left shoulder upper back left abdomen. Question blanches no petechiae pink in color there is also some on her right neck. PSYCH: Oriented, good eye contact, no obvious depression anxiety, cognition and judgment appear normal. LN: no cervical axillary inguinal adenopathy  Lab Results  Component Value Date   WBC 7.2  04/21/2016   HGB 12.9 04/21/2016   HCT 37.1 04/21/2016   PLT 311.0 04/21/2016   GLUCOSE 115 (H) 04/21/2016   CHOL 196 04/21/2016   TRIG 164.0 (H) 04/21/2016   HDL 47.80 04/21/2016   LDLDIRECT 150.0 02/13/2015   LDLCALC 115 (H) 04/21/2016   ALT 13 04/21/2016   AST 11 04/21/2016   NA 139 04/21/2016   K 3.9 04/21/2016   CL 103 04/21/2016   CREATININE 0.74 04/21/2016   BUN 12 04/21/2016   CO2 29 04/21/2016   TSH 2.44 04/21/2016   HGBA1C 5.2 04/27/2016   Wt Readings from Last 3 Encounters:  04/27/16 194 lb (88 kg)  02/21/16 191 lb 12.8 oz (87 kg)  12/31/15 190 lb 6.4 oz (86.4 kg)  BP Readings from Last 3 Encounters:  04/27/16 124/78  02/21/16 116/70  12/31/15 116/70     ASSESSMENT AND PLAN:  Discussed the following assessment and plan:  Visit for preventive health examination  Fasting hyperglycemia - Good A1c today - Plan: POCT A1C  PCOS (polycystic ovarian syndrome) - Consider adding that metformin at some point. But A1c is excellent today.no inc body hiar weight better  Encounter for immunization - Plan: Flu Vaccine QUAD 36+ mos IM, POCT A1C  Radicular pain in left arm - Plan: Ambulatory referral to Sports Medicine  Left sided chest pain - Plan: CT Angio Chest W/Cm &/Or Wo Cm, Ambulatory referral to Sports Medicine, CANCELED: CT Chest W Contrast  Hx of pulmonary embolus - Plan: CT Angio Chest W/Cm &/Or Wo Cm, CANCELED: CT Chest W Contrast  Rash and nonspecific skin eruption - Itchy appears reactive some fading on antihistamine agree with stopping the melatonin consider prednisone if continues  Patient Care Team: Burnis Medin, MD as PCP - General (Internal Medicine) Allyn Kenner, DO as Consulting Physician (Obstetrics and Gynecology) Donnal Moat (Psychiatry) Patient Instructions  I agree with stopping the melatonin in case it was causing an allergic reaction to the end of rash. Try over-the-counter equivalent to Zyrtec once a day and then add the Benadryl  on an as needed for the rash and itching. Since this  is improving will hold on adding prednisone  Which can increase your blood sugar.  consdier going back on metformin?   If not resolved by the end of the week or next week contact us for advice. We'll plan a referral in regard to the left arm side pain. We'll review your record about appropriate referral.  Evaluation . To specialist .  Since the left side pain is worse  And not  Responded to the prednisone  am ordering  A chest ct scan   .   Health Maintenance, Female Introduction Adopting a healthy lifestyle and getting preventive care can go a long way to promote health and wellness. Talk with your health care provider about what schedule of regular examinations is right for you. This is a good chance for you to check in with your provider about disease prevention and staying healthy. In between checkups, there are plenty of things you can do on your own. Experts have done a lot of research about which lifestyle changes and preventive measures are most likely to keep you healthy. Ask your health care provider for more information. Weight and diet Eat a healthy diet  Be sure to include plenty of vegetables, fruits, low-fat dairy products, and lean protein.  Do not eat a lot of foods high in solid fats, added sugars, or salt.  Get regular exercise. This is one of the most important things you can do for your health.  Most adults should exercise for at least 150 minutes each week. The exercise should increase your heart rate and make you sweat (moderate-intensity exercise).  Most adults should also do strengthening exercises at least twice a week. This is in addition to the moderate-intensity exercise. Maintain a healthy weight  Body mass index (BMI) is a measurement that can be used to identify possible weight problems. It estimates body fat based on height and weight. Your health care provider can help determine your BMI and help you  achieve or maintain a healthy weight.  For females 86 years of age and older:  A BMI below 18.5 is considered underweight.  A BMI of  18.5 to 24.9 is normal.  A BMI of 25 to 29.9 is considered overweight.  A BMI of 30 and above is considered obese. Watch levels of cholesterol and blood lipids  You should start having your blood tested for lipids and cholesterol at 27 years of age, then have this test every 5 years.  You may need to have your cholesterol levels checked more often if:  Your lipid or cholesterol levels are high.  You are older than 27 years of age.  You are at high risk for heart disease. Cancer screening Lung Cancer  Lung cancer screening is recommended for adults 64-6 years old who are at high risk for lung cancer because of a history of smoking.  A yearly low-dose CT scan of the lungs is recommended for people who:  Currently smoke.  Have quit within the past 15 years.  Have at least a 30-pack-year history of smoking. A pack year is smoking an average of one pack of cigarettes a day for 1 year.  Yearly screening should continue until it has been 15 years since you quit.  Yearly screening should stop if you develop a health problem that would prevent you from having lung cancer treatment. Breast Cancer  Practice breast self-awareness. This means understanding how your breasts normally appear and feel.  It also means doing regular breast self-exams. Let your health care provider know about any changes, no matter how small.  If you are in your 20s or 30s, you should have a clinical breast exam (CBE) by a health care provider every 1-3 years as part of a regular health exam.  If you are 42 or older, have a CBE every year. Also consider having a breast X-ray (mammogram) every year.  If you have a family history of breast cancer, talk to your health care provider about genetic screening.  If you are at high risk for breast cancer, talk to your health care  provider about having an MRI and a mammogram every year.  Breast cancer gene (BRCA) assessment is recommended for women who have family members with BRCA-related cancers. BRCA-related cancers include:  Breast.  Ovarian.  Tubal.  Peritoneal cancers.  Results of the assessment will determine the need for genetic counseling and BRCA1 and BRCA2 testing. Cervical Cancer  Your health care provider may recommend that you be screened regularly for cancer of the pelvic organs (ovaries, uterus, and vagina). This screening involves a pelvic examination, including checking for microscopic changes to the surface of your cervix (Pap test). You may be encouraged to have this screening done every 3 years, beginning at age 33.  For women ages 36-65, health care providers may recommend pelvic exams and Pap testing every 3 years, or they may recommend the Pap and pelvic exam, combined with testing for human papilloma virus (HPV), every 5 years. Some types of HPV increase your risk of cervical cancer. Testing for HPV may also be done on women of any age with unclear Pap test results.  Other health care providers may not recommend any screening for nonpregnant women who are considered low risk for pelvic cancer and who do not have symptoms. Ask your health care provider if a screening pelvic exam is right for you.  If you have had past treatment for cervical cancer or a condition that could lead to cancer, you need Pap tests and screening for cancer for at least 20 years after your treatment. If Pap tests have been discontinued, your risk factors (such as  having a new sexual partner) need to be reassessed to determine if screening should resume. Some women have medical problems that increase the chance of getting cervical cancer. In these cases, your health care provider may recommend more frequent screening and Pap tests. Colorectal Cancer  This type of cancer can be detected and often prevented.  Routine  colorectal cancer screening usually begins at 27 years of age and continues through 27 years of age.  Your health care provider may recommend screening at an earlier age if you have risk factors for colon cancer.  Your health care provider may also recommend using home test kits to check for hidden blood in the stool.  A small camera at the end of a tube can be used to examine your colon directly (sigmoidoscopy or colonoscopy). This is done to check for the earliest forms of colorectal cancer.  Routine screening usually begins at age 53.  Direct examination of the colon should be repeated every 5-10 years through 27 years of age. However, you may need to be screened more often if early forms of precancerous polyps or small growths are found. Skin Cancer  Check your skin from head to toe regularly.  Tell your health care provider about any new moles or changes in moles, especially if there is a change in a mole's shape or color.  Also tell your health care provider if you have a mole that is larger than the size of a pencil eraser.  Always use sunscreen. Apply sunscreen liberally and repeatedly throughout the day.  Protect yourself by wearing long sleeves, pants, a wide-brimmed hat, and sunglasses whenever you are outside. Heart disease, diabetes, and high blood pressure  High blood pressure causes heart disease and increases the risk of stroke. High blood pressure is more likely to develop in:  People who have blood pressure in the high end of the normal range (130-139/85-89 mm Hg).  People who are overweight or obese.  People who are African American.  If you are 59-27 years of age, have your blood pressure checked every 3-5 years. If you are 26 years of age or older, have your blood pressure checked every year. You should have your blood pressure measured twice-once when you are at a hospital or clinic, and once when you are not at a hospital or clinic. Record the average of the two  measurements. To check your blood pressure when you are not at a hospital or clinic, you can use:  An automated blood pressure machine at a pharmacy.  A home blood pressure monitor.  If you are between 28 years and 44 years old, ask your health care provider if you should take aspirin to prevent strokes.  Have regular diabetes screenings. This involves taking a blood sample to check your fasting blood sugar level.  If you are at a normal weight and have a low risk for diabetes, have this test once every three years after 27 years of age.  If you are overweight and have a high risk for diabetes, consider being tested at a younger age or more often. Preventing infection Hepatitis B  If you have a higher risk for hepatitis B, you should be screened for this virus. You are considered at high risk for hepatitis B if:  You were born in a country where hepatitis B is common. Ask your health care provider which countries are considered high risk.  Your parents were born in a high-risk country, and you have not been immunized  against hepatitis B (hepatitis B vaccine).  You have HIV or AIDS.  You use needles to inject street drugs.  You live with someone who has hepatitis B.  You have had sex with someone who has hepatitis B.  You get hemodialysis treatment.  You take certain medicines for conditions, including cancer, organ transplantation, and autoimmune conditions. Hepatitis C  Blood testing is recommended for:  Everyone born from [birth year] through 1966.  Anyone with known risk factors for hepatitis C. Sexually transmitted infections (STIs)  You should be screened for sexually transmitted infections (STIs) including gonorrhea and chlamydia if:  You are sexually active and are younger than 27 years of age.  You are older than 27 years of age and your health care provider tells you that you are at risk for this type of infection.  Your sexual activity has changed since you were last  screened and you are at an increased risk for chlamydia or gonorrhea. Ask your health care provider if you are at risk.  If you do not have HIV, but are at risk, it may be recommended that you take a prescription medicine daily to prevent HIV infection. This is called pre-exposure prophylaxis (PrEP). You are considered at risk if:  You are sexually active and do not regularly use condoms or know the HIV status of your partner(s).  You take drugs by injection.  You are sexually active with a partner who has HIV. Talk with your health care provider about whether you are at high risk of being infected with HIV. If you choose to begin PrEP, you should first be tested for HIV. You should then be tested every 3 months for as long as you are taking PrEP. Pregnancy  If you are premenopausal and you may become pregnant, ask your health care provider about preconception counseling.  If you may become pregnant, take 400 to 800 micrograms (mcg) of folic acid every day.  If you want to prevent pregnancy, talk to your health care provider about birth control (contraception). Osteoporosis and menopause  Osteoporosis is a disease in which the bones lose minerals and strength with aging. This can result in serious bone fractures. Your risk for osteoporosis can be identified using a bone density scan.  If you are 56 years of age or older, or if you are at risk for osteoporosis and fractures, ask your health care provider if you should be screened.  Ask your health care provider whether you should take a calcium or vitamin D supplement to lower your risk for osteoporosis.  Menopause may have certain physical symptoms and risks.  Hormone replacement therapy may reduce some of these symptoms and risks. Talk to your health care provider about whether hormone replacement therapy is right for you. Follow these instructions at home:  Schedule regular health, dental, and eye exams.  Stay current with your  immunizations.  Do not use any tobacco products including cigarettes, chewing tobacco, or electronic cigarettes.  If you are pregnant, do not drink alcohol.  If you are breastfeeding, limit how much and how often you drink alcohol.  Limit alcohol intake to no more than 1 drink per day for nonpregnant women. One drink equals 12 ounces of beer, 5 ounces of wine, or 1 ounces of hard liquor.  Do not use street drugs.  Do not share needles.  Ask your health care provider for help if you need support or information about quitting drugs.  Tell your health care provider if you often feel  depressed.  Tell your health care provider if you have ever been abused or do not feel safe at home. This information is not intended to replace advice given to you by your health care provider. Make sure you discuss any questions you have with your health care provider. Document Released: 10/20/2010 Document Revised: 09/12/2015 Document Reviewed: 01/08/2015  2017 Elsevier      Mariann Laster K. Panosh M.D.

## 2016-04-27 ENCOUNTER — Encounter: Payer: Self-pay | Admitting: Internal Medicine

## 2016-04-27 ENCOUNTER — Ambulatory Visit (INDEPENDENT_AMBULATORY_CARE_PROVIDER_SITE_OTHER): Payer: BLUE CROSS/BLUE SHIELD | Admitting: Internal Medicine

## 2016-04-27 ENCOUNTER — Ambulatory Visit (HOSPITAL_COMMUNITY)
Admission: RE | Admit: 2016-04-27 | Discharge: 2016-04-27 | Disposition: A | Discharge status: Home / Self Care | Payer: BLUE CROSS/BLUE SHIELD | Source: Ambulatory Visit | Attending: Internal Medicine | Admitting: Internal Medicine

## 2016-04-27 VITALS — BP 124/78 | Temp 98.2°F | Ht 64.0 in | Wt 194.0 lb

## 2016-04-27 DIAGNOSIS — Z23 Encounter for immunization: Secondary | ICD-10-CM

## 2016-04-27 DIAGNOSIS — Z0000 Encounter for general adult medical examination without abnormal findings: Secondary | ICD-10-CM

## 2016-04-27 DIAGNOSIS — Z86711 Personal history of pulmonary embolism: Secondary | ICD-10-CM

## 2016-04-27 DIAGNOSIS — M792 Neuralgia and neuritis, unspecified: Secondary | ICD-10-CM | POA: Diagnosis not present

## 2016-04-27 DIAGNOSIS — R079 Chest pain, unspecified: Secondary | ICD-10-CM

## 2016-04-27 DIAGNOSIS — E282 Polycystic ovarian syndrome: Secondary | ICD-10-CM | POA: Diagnosis not present

## 2016-04-27 DIAGNOSIS — R21 Rash and other nonspecific skin eruption: Secondary | ICD-10-CM

## 2016-04-27 DIAGNOSIS — R7301 Impaired fasting glucose: Secondary | ICD-10-CM

## 2016-04-27 LAB — POCT GLYCOSYLATED HEMOGLOBIN (HGB A1C): HEMOGLOBIN A1C: 5.2

## 2016-04-27 MED ORDER — IOPAMIDOL (ISOVUE-370) INJECTION 76%
100.0000 mL | Freq: Once | INTRAVENOUS | Status: AC | PRN
  Administered 2016-04-27: 100 mL via INTRAVENOUS

## 2016-04-27 NOTE — Patient Instructions (Addendum)
I agree with stopping the melatonin in case it was causing an allergic reaction to the end of rash. Try over-the-counter equivalent to Zyrtec once a day and then add the Benadryl on an as needed for the rash and itching. Since this  is improving will hold on adding prednisone  Which can increase your blood sugar.  consdier going back on metformin?   If not resolved by the end of the week or next week contact us for advice. We'll plan a referral in regard to the left arm side pain. We'll review your record about appropriate referral.  Evaluation . To specialist .  Since the left side pain is worse  And not  Responded to the prednisone  am ordering  A chest ct scan   .   Health Maintenance, Female Introduction Adopting a healthy lifestyle and getting preventive care can go a long way to promote health and wellness. Talk with your health care provider about what schedule of regular examinations is right for you. This is a good chance for you to check in with your provider about disease prevention and staying healthy. In between checkups, there are plenty of things you can do on your own. Experts have done a lot of research about which lifestyle changes and preventive measures are most likely to keep you healthy. Ask your health care provider for more information. Weight and diet Eat a healthy diet  Be sure to include plenty of vegetables, fruits, low-fat dairy products, and lean protein.  Do not eat a lot of foods high in solid fats, added sugars, or salt.  Get regular exercise. This is one of the most important things you can do for your health.  Most adults should exercise for at least 150 minutes each week. The exercise should increase your heart rate and make you sweat (moderate-intensity exercise).  Most adults should also do strengthening exercises at least twice a week. This is in addition to the moderate-intensity exercise. Maintain a healthy weight  Body mass index (BMI) is a  measurement that can be used to identify possible weight problems. It estimates body fat based on height and weight. Your health care provider can help determine your BMI and help you achieve or maintain a healthy weight.  For females 23 years of age and older:  A BMI below 18.5 is considered underweight.  A BMI of 18.5 to 24.9 is normal.  A BMI of 25 to 29.9 is considered overweight.  A BMI of 30 and above is considered obese. Watch levels of cholesterol and blood lipids  You should start having your blood tested for lipids and cholesterol at 27 years of age, then have this test every 5 years.  You may need to have your cholesterol levels checked more often if:  Your lipid or cholesterol levels are high.  You are older than 27 years of age.  You are at high risk for heart disease. Cancer screening Lung Cancer  Lung cancer screening is recommended for adults 26-38 years old who are at high risk for lung cancer because of a history of smoking.  A yearly low-dose CT scan of the lungs is recommended for people who:  Currently smoke.  Have quit within the past 15 years.  Have at least a 30-pack-year history of smoking. A pack year is smoking an average of one pack of cigarettes a day for 1 year.  Yearly screening should continue until it has been 15 years since you quit.  Yearly screening  should stop if you develop a health problem that would prevent you from having lung cancer treatment. Breast Cancer  Practice breast self-awareness. This means understanding how your breasts normally appear and feel.  It also means doing regular breast self-exams. Let your health care provider know about any changes, no matter how small.  If you are in your 20s or 30s, you should have a clinical breast exam (CBE) by a health care provider every 1-3 years as part of a regular health exam.  If you are 69 or older, have a CBE every year. Also consider having a breast X-ray (mammogram) every  year.  If you have a family history of breast cancer, talk to your health care provider about genetic screening.  If you are at high risk for breast cancer, talk to your health care provider about having an MRI and a mammogram every year.  Breast cancer gene (BRCA) assessment is recommended for women who have family members with BRCA-related cancers. BRCA-related cancers include:  Breast.  Ovarian.  Tubal.  Peritoneal cancers.  Results of the assessment will determine the need for genetic counseling and BRCA1 and BRCA2 testing. Cervical Cancer  Your health care provider may recommend that you be screened regularly for cancer of the pelvic organs (ovaries, uterus, and vagina). This screening involves a pelvic examination, including checking for microscopic changes to the surface of your cervix (Pap test). You may be encouraged to have this screening done every 3 years, beginning at age 60.  For women ages 76-65, health care providers may recommend pelvic exams and Pap testing every 3 years, or they may recommend the Pap and pelvic exam, combined with testing for human papilloma virus (HPV), every 5 years. Some types of HPV increase your risk of cervical cancer. Testing for HPV may also be done on women of any age with unclear Pap test results.  Other health care providers may not recommend any screening for nonpregnant women who are considered low risk for pelvic cancer and who do not have symptoms. Ask your health care provider if a screening pelvic exam is right for you.  If you have had past treatment for cervical cancer or a condition that could lead to cancer, you need Pap tests and screening for cancer for at least 20 years after your treatment. If Pap tests have been discontinued, your risk factors (such as having a new sexual partner) need to be reassessed to determine if screening should resume. Some women have medical problems that increase the chance of getting cervical cancer. In  these cases, your health care provider may recommend more frequent screening and Pap tests. Colorectal Cancer  This type of cancer can be detected and often prevented.  Routine colorectal cancer screening usually begins at 27 years of age and continues through 27 years of age.  Your health care provider may recommend screening at an earlier age if you have risk factors for colon cancer.  Your health care provider may also recommend using home test kits to check for hidden blood in the stool.  A small camera at the end of a tube can be used to examine your colon directly (sigmoidoscopy or colonoscopy). This is done to check for the earliest forms of colorectal cancer.  Routine screening usually begins at age 19.  Direct examination of the colon should be repeated every 5-10 years through 27 years of age. However, you may need to be screened more often if early forms of precancerous polyps or small growths  are found. Skin Cancer  Check your skin from head to toe regularly.  Tell your health care provider about any new moles or changes in moles, especially if there is a change in a mole's shape or color.  Also tell your health care provider if you have a mole that is larger than the size of a pencil eraser.  Always use sunscreen. Apply sunscreen liberally and repeatedly throughout the day.  Protect yourself by wearing long sleeves, pants, a wide-brimmed hat, and sunglasses whenever you are outside. Heart disease, diabetes, and high blood pressure  High blood pressure causes heart disease and increases the risk of stroke. High blood pressure is more likely to develop in:  People who have blood pressure in the high end of the normal range (130-139/85-89 mm Hg).  People who are overweight or obese.  People who are African American.  If you are 88-87 years of age, have your blood pressure checked every 3-5 years. If you are 69 years of age or older, have your blood pressure checked  every year. You should have your blood pressure measured twice-once when you are at a hospital or clinic, and once when you are not at a hospital or clinic. Record the average of the two measurements. To check your blood pressure when you are not at a hospital or clinic, you can use:  An automated blood pressure machine at a pharmacy.  A home blood pressure monitor.  If you are between 53 years and 75 years old, ask your health care provider if you should take aspirin to prevent strokes.  Have regular diabetes screenings. This involves taking a blood sample to check your fasting blood sugar level.  If you are at a normal weight and have a low risk for diabetes, have this test once every three years after 27 years of age.  If you are overweight and have a high risk for diabetes, consider being tested at a younger age or more often. Preventing infection Hepatitis B  If you have a higher risk for hepatitis B, you should be screened for this virus. You are considered at high risk for hepatitis B if:  You were born in a country where hepatitis B is common. Ask your health care provider which countries are considered high risk.  Your parents were born in a high-risk country, and you have not been immunized against hepatitis B (hepatitis B vaccine).  You have HIV or AIDS.  You use needles to inject street drugs.  You live with someone who has hepatitis B.  You have had sex with someone who has hepatitis B.  You get hemodialysis treatment.  You take certain medicines for conditions, including cancer, organ transplantation, and autoimmune conditions. Hepatitis C  Blood testing is recommended for:  Everyone born from [birth year] through 1966.  Anyone with known risk factors for hepatitis C. Sexually transmitted infections (STIs)  You should be screened for sexually transmitted infections (STIs) including gonorrhea and chlamydia if:  You are sexually active and are younger than 27 years of  age.  You are older than 27 years of age and your health care provider tells you that you are at risk for this type of infection.  Your sexual activity has changed since you were last screened and you are at an increased risk for chlamydia or gonorrhea. Ask your health care provider if you are at risk.  If you do not have HIV, but are at risk, it may be recommended that you take  a prescription medicine daily to prevent HIV infection. This is called pre-exposure prophylaxis (PrEP). You are considered at risk if:  You are sexually active and do not regularly use condoms or know the HIV status of your partner(s).  You take drugs by injection.  You are sexually active with a partner who has HIV. Talk with your health care provider about whether you are at high risk of being infected with HIV. If you choose to begin PrEP, you should first be tested for HIV. You should then be tested every 3 months for as long as you are taking PrEP. Pregnancy  If you are premenopausal and you may become pregnant, ask your health care provider about preconception counseling.  If you may become pregnant, take 400 to 800 micrograms (mcg) of folic acid every day.  If you want to prevent pregnancy, talk to your health care provider about birth control (contraception). Osteoporosis and menopause  Osteoporosis is a disease in which the bones lose minerals and strength with aging. This can result in serious bone fractures. Your risk for osteoporosis can be identified using a bone density scan.  If you are 46 years of age or older, or if you are at risk for osteoporosis and fractures, ask your health care provider if you should be screened.  Ask your health care provider whether you should take a calcium or vitamin D supplement to lower your risk for osteoporosis.  Menopause may have certain physical symptoms and risks.  Hormone replacement therapy may reduce some of these symptoms and risks. Talk to your health  care provider about whether hormone replacement therapy is right for you. Follow these instructions at home:  Schedule regular health, dental, and eye exams.  Stay current with your immunizations.  Do not use any tobacco products including cigarettes, chewing tobacco, or electronic cigarettes.  If you are pregnant, do not drink alcohol.  If you are breastfeeding, limit how much and how often you drink alcohol.  Limit alcohol intake to no more than 1 drink per day for nonpregnant women. One drink equals 12 ounces of beer, 5 ounces of wine, or 1 ounces of hard liquor.  Do not use street drugs.  Do not share needles.  Ask your health care provider for help if you need support or information about quitting drugs.  Tell your health care provider if you often feel depressed.  Tell your health care provider if you have ever been abused or do not feel safe at home. This information is not intended to replace advice given to you by your health care provider. Make sure you discuss any questions you have with your health care provider. Document Released: 10/20/2010 Document Revised: 09/12/2015 Document Reviewed: 01/08/2015  2017 [employer]

## 2016-05-16 ENCOUNTER — Encounter (HOSPITAL_BASED_OUTPATIENT_CLINIC_OR_DEPARTMENT_OTHER): Payer: Self-pay

## 2016-05-16 ENCOUNTER — Observation Stay (HOSPITAL_BASED_OUTPATIENT_CLINIC_OR_DEPARTMENT_OTHER)
Admission: EM | Admit: 2016-05-16 | Discharge: 2016-05-18 | Disposition: A | Discharge status: Home / Self Care | Payer: BLUE CROSS/BLUE SHIELD | Attending: Internal Medicine | Admitting: Internal Medicine

## 2016-05-16 ENCOUNTER — Emergency Department (HOSPITAL_BASED_OUTPATIENT_CLINIC_OR_DEPARTMENT_OTHER): Payer: BLUE CROSS/BLUE SHIELD

## 2016-05-16 DIAGNOSIS — I951 Orthostatic hypotension: Secondary | ICD-10-CM | POA: Diagnosis not present

## 2016-05-16 DIAGNOSIS — A0811 Acute gastroenteropathy due to Norwalk agent: Secondary | ICD-10-CM | POA: Diagnosis present

## 2016-05-16 DIAGNOSIS — R55 Syncope and collapse: Secondary | ICD-10-CM

## 2016-05-16 DIAGNOSIS — R1013 Epigastric pain: Secondary | ICD-10-CM | POA: Diagnosis not present

## 2016-05-16 DIAGNOSIS — F1921 Other psychoactive substance dependence, in remission: Secondary | ICD-10-CM | POA: Diagnosis not present

## 2016-05-16 DIAGNOSIS — Z8249 Family history of ischemic heart disease and other diseases of the circulatory system: Secondary | ICD-10-CM | POA: Diagnosis not present

## 2016-05-16 DIAGNOSIS — R509 Fever, unspecified: Secondary | ICD-10-CM | POA: Insufficient documentation

## 2016-05-16 DIAGNOSIS — F17210 Nicotine dependence, cigarettes, uncomplicated: Secondary | ICD-10-CM | POA: Insufficient documentation

## 2016-05-16 DIAGNOSIS — R000 Tachycardia, unspecified: Secondary | ICD-10-CM | POA: Insufficient documentation

## 2016-05-16 DIAGNOSIS — E876 Hypokalemia: Secondary | ICD-10-CM | POA: Diagnosis not present

## 2016-05-16 DIAGNOSIS — K521 Toxic gastroenteritis and colitis: Secondary | ICD-10-CM | POA: Insufficient documentation

## 2016-05-16 DIAGNOSIS — Z800 Family history of malignant neoplasm of digestive organs: Secondary | ICD-10-CM | POA: Diagnosis not present

## 2016-05-16 DIAGNOSIS — R197 Diarrhea, unspecified: Secondary | ICD-10-CM | POA: Insufficient documentation

## 2016-05-16 DIAGNOSIS — R6889 Other general symptoms and signs: Secondary | ICD-10-CM | POA: Diagnosis present

## 2016-05-16 DIAGNOSIS — Z86711 Personal history of pulmonary embolism: Secondary | ICD-10-CM | POA: Insufficient documentation

## 2016-05-16 DIAGNOSIS — Z7982 Long term (current) use of aspirin: Secondary | ICD-10-CM | POA: Insufficient documentation

## 2016-05-16 DIAGNOSIS — F319 Bipolar disorder, unspecified: Secondary | ICD-10-CM | POA: Insufficient documentation

## 2016-05-16 DIAGNOSIS — T375X5A Adverse effect of antiviral drugs, initial encounter: Secondary | ICD-10-CM | POA: Diagnosis not present

## 2016-05-16 DIAGNOSIS — E860 Dehydration: Secondary | ICD-10-CM | POA: Diagnosis not present

## 2016-05-16 HISTORY — DX: Syncope and collapse: R55

## 2016-05-16 LAB — CBC WITH DIFFERENTIAL/PLATELET
Basophils Absolute: 0 10*3/uL (ref 0.0–0.1)
Basophils Relative: 0 %
EOS ABS: 0 10*3/uL (ref 0.0–0.7)
EOS PCT: 0 %
HCT: 40.9 % (ref 36.0–46.0)
Hemoglobin: 13.5 g/dL (ref 12.0–15.0)
LYMPHS ABS: 0.3 10*3/uL — AB (ref 0.7–4.0)
Lymphocytes Relative: 4 %
MCH: 28.5 pg (ref 26.0–34.0)
MCHC: 33 g/dL (ref 30.0–36.0)
MCV: 86.3 fL (ref 78.0–100.0)
MONO ABS: 0.8 10*3/uL (ref 0.1–1.0)
Monocytes Relative: 8 %
Neutro Abs: 8.3 10*3/uL — ABNORMAL HIGH (ref 1.7–7.7)
Neutrophils Relative %: 88 %
PLATELETS: 310 10*3/uL (ref 150–400)
RBC: 4.74 MIL/uL (ref 3.87–5.11)
RDW: 13.2 % (ref 11.5–15.5)
WBC: 9.4 10*3/uL (ref 4.0–10.5)

## 2016-05-16 LAB — URINALYSIS, ROUTINE W REFLEX MICROSCOPIC
BILIRUBIN URINE: NEGATIVE
Glucose, UA: NEGATIVE mg/dL
HGB URINE DIPSTICK: NEGATIVE
KETONES UR: NEGATIVE mg/dL
Leukocytes, UA: NEGATIVE
NITRITE: NEGATIVE
PH: 7 (ref 5.0–8.0)
Protein, ur: NEGATIVE mg/dL
Specific Gravity, Urine: 1.027 (ref 1.005–1.030)

## 2016-05-16 LAB — I-STAT CG4 LACTIC ACID, ED: LACTIC ACID, VENOUS: 1.28 mmol/L (ref 0.5–1.9)

## 2016-05-16 LAB — COMPREHENSIVE METABOLIC PANEL
ALT: 17 U/L (ref 14–54)
ANION GAP: 9 (ref 5–15)
AST: 21 U/L (ref 15–41)
Albumin: 4 g/dL (ref 3.5–5.0)
Alkaline Phosphatase: 40 U/L (ref 38–126)
BUN: 17 mg/dL (ref 6–20)
CHLORIDE: 102 mmol/L (ref 101–111)
CO2: 25 mmol/L (ref 22–32)
Calcium: 8.7 mg/dL — ABNORMAL LOW (ref 8.9–10.3)
Creatinine, Ser: 0.81 mg/dL (ref 0.44–1.00)
Glucose, Bld: 136 mg/dL — ABNORMAL HIGH (ref 65–99)
POTASSIUM: 3.3 mmol/L — AB (ref 3.5–5.1)
SODIUM: 136 mmol/L (ref 135–145)
Total Bilirubin: 0.6 mg/dL (ref 0.3–1.2)
Total Protein: 7.5 g/dL (ref 6.5–8.1)

## 2016-05-16 LAB — LIPASE, BLOOD: LIPASE: 17 U/L (ref 11–51)

## 2016-05-16 LAB — PREGNANCY, URINE: Preg Test, Ur: NEGATIVE

## 2016-05-16 LAB — PROTIME-INR
INR: 1.03
PROTHROMBIN TIME: 13.5 s (ref 11.4–15.2)

## 2016-05-16 LAB — TROPONIN I: Troponin I: <0.03 ng/mL (ref <0.03)

## 2016-05-16 MED ORDER — ACETAMINOPHEN 325 MG PO TABS
650.0000 mg | ORAL_TABLET | Freq: Once | ORAL | Status: AC
  Administered 2016-05-16: 650 mg via ORAL
  Filled 2016-05-16: qty 2

## 2016-05-16 MED ORDER — SODIUM CHLORIDE 0.9 % IV BOLUS (SEPSIS)
1000.0000 mL | Freq: Once | INTRAVENOUS | Status: AC
  Administered 2016-05-16: 1000 mL via INTRAVENOUS

## 2016-05-16 MED ORDER — POTASSIUM CHLORIDE CRYS ER 20 MEQ PO TBCR
40.0000 meq | EXTENDED_RELEASE_TABLET | Freq: Once | ORAL | Status: AC
  Administered 2016-05-16: 40 meq via ORAL
  Filled 2016-05-16: qty 2

## 2016-05-16 MED ORDER — IOPAMIDOL (ISOVUE-370) INJECTION 76%
100.0000 mL | Freq: Once | INTRAVENOUS | Status: AC | PRN
  Administered 2016-05-16: 100 mL via INTRAVENOUS

## 2016-05-16 NOTE — ED Notes (Signed)
Back from b/r, reports generally weak, denies dizziness or light-headedness, no changes.

## 2016-05-16 NOTE — ED Triage Notes (Signed)
Pt reports flu like symptoms x 2 days. Husband reports syncopal episode x 2 this afternoon. Husband sts that pt was only "out" for seconds. Pt also reports some nausea and vomiting today.

## 2016-05-16 NOTE — ED Notes (Signed)
Patient stated that she was unable to urinate at this time. Patient has a labeled urine cup at bedside.

## 2016-05-16 NOTE — ED Notes (Signed)
Up to br with steady gait

## 2016-05-16 NOTE — ED Notes (Signed)
Up to b/r, steady gait, NAD, calm, interactive, resps e/u, no dyspnea noted.

## 2016-05-16 NOTE — ED Provider Notes (Signed)
[facility] Provider Note   CSN: [id number] Arrival date & time: 05/16/16  1800  By signing my name below, I, Arianna Nassar, attest that this documentation has been prepared under the direction and in the presence of Heide Scales, MD.  Electronically Signed: Octavia Heir, ED Scribe. 05/16/16. 7:33 PM.    History   Chief Complaint Chief Complaint  Patient presents with  . Loss of Consciousness   The history is provided by the patient. No language interpreter was used.  Loss of Consciousness   This is a new problem. The current episode started 3 to 5 hours ago. The problem occurs rarely. The problem has not changed since onset.She lost consciousness for a period of less than one minute. The problem is associated with standing up and exertion. Associated symptoms include bowel incontinence. Pertinent negatives include abdominal pain, back pain, chest pain, confusion, congestion, diaphoresis, dizziness, fever, headaches, light-headedness, nausea, palpitations, vomiting and weakness. She has tried nothing for the symptoms. The treatment provided no relief. Her past medical history does not include CAD, CVA, DM, HTN, seizures, TIA or vertigo.   HPI Comments: Sheri [name] is a 26 y.o. female who has a PMhx of ETOH abuse, bipolar depression, drug addiction, and PE presents to the Emergency Department s/p 3 syncopal episodes that occurred this afternoon. She states the past 24 hours she has been feeling persistently fatigued with associated nausea, chills, diarrhea, and fever (tmax 99.5). She further notes having dysuria, irregular vaginal bleeding, abdominal distension that has been occurring intermittently over the past week. Pt notes this evening, she was extremely tired, laid down and took a nap. Partner said pt woke up from her nap, started vomiting, and "collapsed to the floor". He states pt had one more episode of vomiting with loss of consciousness a few minutes  later in which she expresses having bowel incontinence. She has a hx of PE ~ 5 years ago. She expresses having a CT angio chest imaging performed on 01/08 to rule out PE that came back unremarkable. She says that she has been having left sided breast pain that radiates like a "band" across the epigastric region abdomen which she describes as tightness for the past week. She says the pain has been gradually worsening over the past several days. Pt reports her pain is worse with exertion. She was placed on coumadin after her first diagnosis of PE but says she stopped taking it 6 months after. She says that she was placed on baby aspirin since but says she stopped taking it 2 weeks ago after having an unremarkable CT. She has not had any recent trauma to her chest. Pt denies hx of seizures. She has been around sick children. Pt denies chest pain, cough, leg swelling, shortness of breath, rhinorrhea, congestion, sore throat, and constipation.  Past Medical History:  Diagnosis Date  . Alcohol addiction (HCC)   . Bipolar depression (HCC)    under psych rx   . Depression   . Drug addiction (HCC)   . Drug addiction in remission [facility])    heroin  . Pulmonary embolism (HCC) 2011   neg heme evaluation  felt to be from ocps     Patient Active Problem List   Diagnosis Date Noted  . PCOS (polycystic ovarian syndrome) 02/13/2015  . IUD (intrauterine device) in place 02/13/2015  . Hx of pulmonary embolus 02/13/2015  . Visit for preventive health examination 02/13/2015  . Bipolar depression [facility])     Past Surgical  History:  Procedure Laterality Date  . NO PAST SURGERIES      OB History    Gravida Para Term Preterm AB Living   0 0 0 0 0 0   SAB TAB Ectopic Multiple Live Births   0 0 0 0         Home Medications    Prior to Admission medications   Medication Sig Start Date End Date Taking? Authorizing Provider  aspirin 81 MG tablet Take 81 mg by mouth daily.    Historical Provider, MD    PARAGARD INTRAUTERINE COPPER IUD IUD 1 each by Intrauterine route once.    Historical Provider, MD    Family History Family History  Problem Relation Age of Onset  . Colon cancer Maternal Grandfather   . Hypertension Father     Social History Social History  Substance Use Topics  . Smoking status: Light Tobacco Smoker  . Smokeless tobacco: Never Used  . Alcohol use No     Allergies   Bupropion   Review of Systems Review of Systems  Constitutional: Negative for activity change, chills, diaphoresis, fatigue and fever.  HENT: Negative for congestion and rhinorrhea.   Eyes: Negative for visual disturbance.  Respiratory: Negative for cough, chest tightness, shortness of breath and stridor.   Cardiovascular: Positive for syncope. Negative for chest pain, palpitations and leg swelling.  Gastrointestinal: Positive for bowel incontinence. Negative for abdominal distention, abdominal pain, constipation, diarrhea, nausea and vomiting.  Genitourinary: Negative for difficulty urinating, dysuria, flank pain, frequency, hematuria, menstrual problem, pelvic pain, vaginal bleeding and vaginal discharge.  Musculoskeletal: Negative for back pain and neck pain.  Skin: Negative for rash and wound.  Neurological: Negative for dizziness, weakness, light-headedness, numbness and headaches.  Psychiatric/Behavioral: Negative for agitation and confusion.  All other systems reviewed and are negative.    Physical Exam Updated Vital Signs BP 122/68 (BP Location: Left Arm)   Pulse 119   Temp 98.2 F (36.8 C) (Oral)   Resp 17   Ht 5\' 3"  (1.6 m)   Wt 193 lb (87.5 kg)   LMP 03/27/2016   SpO2 100%   BMI 34.19 kg/m   Physical Exam  Constitutional: She is oriented to person, place, and time. She appears well-developed and well-nourished. No distress.  HENT:  Head: Normocephalic and atraumatic.  Right Ear: External ear normal.  Left Ear: External ear normal.  Nose: Nose normal.   Mouth/Throat: Oropharynx is clear and moist. No oropharyngeal exudate.  Eyes: Conjunctivae and EOM are normal. Pupils are equal, round, and reactive to light.  Neck: Normal range of motion. Neck supple.  Pulmonary/Chest: No stridor. No respiratory distress. She exhibits no tenderness.  Abdominal: She exhibits no distension. There is no tenderness. There is no rebound.  Neurological: She is alert and oriented to person, place, and time. She has normal reflexes. She exhibits normal muscle tone. Coordination normal.  Skin: Skin is warm. No rash noted. She is not diaphoretic. No erythema.  Nursing note and vitals reviewed.    ED Treatments / Results  DIAGNOSTIC STUDIES: Oxygen Saturation is 100% on RA, normal by my interpretation.  COORDINATION OF CARE:  7:11 PM Discussed treatment plan with pt at bedside and pt agreed to plan.  Labs (all labs ordered are listed, but only abnormal results are displayed) Labs Reviewed  GASTROINTESTINAL PANEL BY PCR, STOOL (REPLACES STOOL CULTURE) - Abnormal; Notable for the following:       Result Value   Norovirus GI/GII DETECTED (*)  All other components within normal limits  CBC WITH DIFFERENTIAL/PLATELET - Abnormal; Notable for the following:    Neutro Abs 8.3 (*)    Lymphs Abs 0.3 (*)    All other components within normal limits  COMPREHENSIVE METABOLIC PANEL - Abnormal; Notable for the following:    Potassium 3.3 (*)    Glucose, Bld 136 (*)    Calcium 8.7 (*)    All other components within normal limits  BASIC METABOLIC PANEL - Abnormal; Notable for the following:    CO2 21 (*)    Calcium 7.8 (*)    All other components within normal limits  URINE CULTURE  URINE CULTURE  CULTURE, BLOOD (ROUTINE X 2)  CULTURE, BLOOD (ROUTINE X 2)  URINALYSIS, ROUTINE W REFLEX MICROSCOPIC  PREGNANCY, URINE  LIPASE, BLOOD  PROTIME-INR  INFLUENZA PANEL BY PCR (TYPE A & B)  TROPONIN I  RAPID URINE DRUG SCREEN, HOSP PERFORMED  GLUCOSE, CAPILLARY   I-STAT CG4 LACTIC ACID, ED    EKG  EKG Interpretation  Date/Time:  Saturday May 16 2016 18:17:24 EST Ventricular Rate:  114 PR Interval:  112 QRS Duration: 86 QT Interval:  324 QTC Calculation: 446 R Axis:   86 Text Interpretation:  Sinus tachycardia Cannot rule out Inferior infarct , age undetermined Abnormal ECG S1Q3T3  No prior ECG in system for comparison No STEMI Confirmed by Rush LandmarkEGELER MD, Kennedee Kitzmiller [phone]) on 05/16/2016 7:14:44 PM       Radiology Ct Angio Chest Pe W And/or Wo Contrast  Result Date: 05/16/2016 CLINICAL DATA:  Chest pain and syncope. History of pulmonary embolism. EXAM: CT ANGIOGRAPHY CHEST WITH CONTRAST TECHNIQUE: Multidetector CT imaging of the chest was performed using the standard protocol during bolus administration of intravenous contrast. Multiplanar CT image reconstructions and MIPs were obtained to evaluate the vascular anatomy. CONTRAST:  100 cc Isovue 370 intravenous COMPARISON:  04/27/2016 FINDINGS: Cardiovascular: Satisfactory opacification of the pulmonary arteries to the segmental level. No evidence of pulmonary embolism. Normal heart size. No pericardial effusion. Mediastinum/Nodes: Negative for mass or adenopathy. Lungs/Pleura: There is no edema, consolidation, effusion, or pneumothorax. Upper Abdomen: Negative Musculoskeletal: Negative Review of the MIP images confirms the above findings. IMPRESSION: Negative for pulmonary embolism or other acute finding. Electronically Signed   By: Marnee SpringJonathon  Watts M.D.   On: 05/16/2016 20:55    Procedures Procedures (including critical care time)  Medications Ordered in ED Medications  sodium chloride flush (NS) 0.9 % injection 3 mL (0 mLs Intravenous Duplicate 05/17/16 1000)  0.9 %  sodium chloride infusion ( Intravenous New Bag/Given 05/17/16 0541)  ondansetron (ZOFRAN) tablet 4 mg (not administered)    Or  ondansetron (ZOFRAN) injection 4 mg (not administered)  famotidine (PEPCID) tablet 20 mg (not  administered)  loperamide (IMODIUM) capsule 2 mg (not administered)  sodium chloride 0.9 % bolus 1,000 mL (0 mLs Intravenous Stopped 05/16/16 2049)  iopamidol (ISOVUE-370) 76 % injection 100 mL (100 mLs Intravenous Contrast Given 05/16/16 2031)  acetaminophen (TYLENOL) tablet 650 mg (650 mg Oral Given 05/16/16 2149)  potassium chloride SA (K-DUR,KLOR-CON) CR tablet 40 mEq (40 mEq Oral Given 05/16/16 2150)  sodium chloride 0.9 % bolus 1,000 mL (0 mLs Intravenous Stopped 05/16/16 2136)  sodium chloride 0.9 % bolus 1,000 mL (0 mLs Intravenous Stopped 05/16/16 2353)  ondansetron (ZOFRAN-ODT) disintegrating tablet 4 mg (4 mg Oral Given 05/17/16 0057)     Initial Impression / Assessment and Plan / ED Course  I have reviewed the triage vital signs and the nursing notes.  Pertinent labs & imaging results that were available during my care of the patient were reviewed by me and considered in my medical decision making (see chart for details).     Pat Mellody [name] is a 26 y.o. female who has a PMhx of ETOH abuse, bipolar depression, drug addiction, and prior PE presents to the Emergency Department s/p 3 syncopal episodes, chest pain, and tachycardia.   History and exam are seen above.  On exam, patient's lungs are clear. Abdomen and chest are nontender. No CVA tenderness. No rashes. Mild congestion seen.  Based on patient's report of chest pa there is clinical concern for pulmona EKG in triage revealed a S1Q3T3 pattern. Patient does not have any other EKG"S in our system to compare. Patient does report recent diarrhea with some nausea and vomiting, patient could be dehydrated contributed to syncope.  On exam, patient is tachycardic in the 120's. Patient also had soft blood pressures in the 90's and low 100's.   Diagnostic workup showed negative troponin, negative lactic acid, slight hypokalemia But otherwise no evidence of leukocytosis for anemia. Urinalysis revealed no infection, pregnancy, or drug  abuse.  After three L of normal saline, patient continued to have soft blood pressure and persistent tachycardia. Given concerning story of syncopal episodes, patient admitted for further workup. Other considerations are seizure although significant other does not describe seizure activity, pericardial effusion although CT did not reveal this, or profound dehydration from viral infection.  Patient admitted to [facility] in stable condition.    Final Clinical Impressions(s) / ED Diagnoses   Final diagnoses:  Syncope and collapse   I personally performed the services described in this documentation, which was scribed in my presence. The recorded information has been reviewed and is accurate.  New Prescriptions Current Discharge Medication List      Clinical Impression: 1. Syncope and collapse     Disposition: Admit to Hospitalist service    Heide Scales, MD 05/17/16 [phone]

## 2016-05-17 ENCOUNTER — Encounter (HOSPITAL_COMMUNITY): Payer: Self-pay

## 2016-05-17 ENCOUNTER — Observation Stay (HOSPITAL_BASED_OUTPATIENT_CLINIC_OR_DEPARTMENT_OTHER): Payer: BLUE CROSS/BLUE SHIELD

## 2016-05-17 DIAGNOSIS — R55 Syncope and collapse: Secondary | ICD-10-CM

## 2016-05-17 DIAGNOSIS — R197 Diarrhea, unspecified: Secondary | ICD-10-CM | POA: Diagnosis not present

## 2016-05-17 DIAGNOSIS — E876 Hypokalemia: Secondary | ICD-10-CM | POA: Diagnosis present

## 2016-05-17 DIAGNOSIS — R000 Tachycardia, unspecified: Secondary | ICD-10-CM

## 2016-05-17 DIAGNOSIS — I959 Hypotension, unspecified: Secondary | ICD-10-CM

## 2016-05-17 DIAGNOSIS — R6889 Other general symptoms and signs: Secondary | ICD-10-CM | POA: Diagnosis present

## 2016-05-17 HISTORY — DX: Hypokalemia: E87.6

## 2016-05-17 LAB — GASTROINTESTINAL PANEL BY PCR, STOOL (REPLACES STOOL CULTURE)
Adenovirus F40/41: NOT DETECTED
Astrovirus: NOT DETECTED
CAMPYLOBACTER SPECIES: NOT DETECTED
Cryptosporidium: NOT DETECTED
Cyclospora cayetanensis: NOT DETECTED
ENTEROTOXIGENIC E COLI (ETEC): NOT DETECTED
Entamoeba histolytica: NOT DETECTED
Enteroaggregative E coli (EAEC): NOT DETECTED
Enteropathogenic E coli (EPEC): NOT DETECTED
Giardia lamblia: NOT DETECTED
NOROVIRUS GI/GII: DETECTED — AB
PLESIMONAS SHIGELLOIDES: NOT DETECTED
ROTAVIRUS A: NOT DETECTED
SALMONELLA SPECIES: NOT DETECTED
SAPOVIRUS (I, II, IV, AND V): NOT DETECTED
SHIGA LIKE TOXIN PRODUCING E COLI (STEC): NOT DETECTED
SHIGELLA/ENTEROINVASIVE E COLI (EIEC): NOT DETECTED
Vibrio cholerae: NOT DETECTED
Vibrio species: NOT DETECTED
Yersinia enterocolitica: NOT DETECTED

## 2016-05-17 LAB — ECHOCARDIOGRAM COMPLETE
HEIGHTINCHES: 63 in
Weight: 3182.4 oz

## 2016-05-17 LAB — BASIC METABOLIC PANEL
Anion gap: 9 (ref 5–15)
BUN: 8 mg/dL (ref 6–20)
CHLORIDE: 109 mmol/L (ref 101–111)
CO2: 21 mmol/L — ABNORMAL LOW (ref 22–32)
Calcium: 7.8 mg/dL — ABNORMAL LOW (ref 8.9–10.3)
Creatinine, Ser: 0.71 mg/dL (ref 0.44–1.00)
Glucose, Bld: 86 mg/dL (ref 65–99)
POTASSIUM: 3.7 mmol/L (ref 3.5–5.1)
Sodium: 139 mmol/L (ref 135–145)

## 2016-05-17 LAB — RAPID URINE DRUG SCREEN, HOSP PERFORMED
AMPHETAMINES: NOT DETECTED
Barbiturates: NOT DETECTED
Benzodiazepines: NOT DETECTED
Cocaine: NOT DETECTED
Opiates: NOT DETECTED
TETRAHYDROCANNABINOL: NOT DETECTED

## 2016-05-17 LAB — GLUCOSE, CAPILLARY: GLUCOSE-CAPILLARY: 91 mg/dL (ref 65–99)

## 2016-05-17 LAB — INFLUENZA PANEL BY PCR (TYPE A & B)
INFLAPCR: NEGATIVE
INFLBPCR: NEGATIVE

## 2016-05-17 MED ORDER — FAMOTIDINE 20 MG PO TABS
20.0000 mg | ORAL_TABLET | Freq: Every day | ORAL | Status: DC
  Administered 2016-05-17 – 2016-05-18 (×2): 20 mg via ORAL
  Filled 2016-05-17 (×2): qty 1

## 2016-05-17 MED ORDER — SODIUM CHLORIDE 0.9 % IV SOLN
INTRAVENOUS | Status: DC
  Administered 2016-05-17: 06:00:00 via INTRAVENOUS

## 2016-05-17 MED ORDER — ACETAMINOPHEN 325 MG PO TABS
650.0000 mg | ORAL_TABLET | Freq: Four times a day (QID) | ORAL | Status: DC | PRN
  Administered 2016-05-17 (×2): 650 mg via ORAL
  Filled 2016-05-17 (×2): qty 2

## 2016-05-17 MED ORDER — SODIUM CHLORIDE 0.9% FLUSH: 3.0000 mL | Freq: Two times a day (BID) | INTRAVENOUS | Status: DC

## 2016-05-17 MED ORDER — ONDANSETRON 4 MG PO TBDP
ORAL_TABLET | ORAL | Status: AC
  Filled 2016-05-17: qty 1

## 2016-05-17 MED ORDER — ONDANSETRON HCL 4 MG/2ML IJ SOLN
4.0000 mg | Freq: Four times a day (QID) | INTRAMUSCULAR | Status: DC | PRN
  Administered 2016-05-17 – 2016-05-18 (×2): 4 mg via INTRAVENOUS
  Filled 2016-05-17 (×2): qty 2

## 2016-05-17 MED ORDER — ONDANSETRON HCL 4 MG PO TABS
4.0000 mg | ORAL_TABLET | Freq: Four times a day (QID) | ORAL | Status: DC | PRN
  Administered 2016-05-18: 4 mg via ORAL
  Filled 2016-05-17: qty 1

## 2016-05-17 MED ORDER — LOPERAMIDE HCL 2 MG PO CAPS
2.0000 mg | ORAL_CAPSULE | ORAL | Status: DC | PRN
  Administered 2016-05-17: 2 mg via ORAL
  Filled 2016-05-17: qty 1

## 2016-05-17 MED ORDER — ONDANSETRON 4 MG PO TBDP
4.0000 mg | ORAL_TABLET | Freq: Once | ORAL | Status: AC
  Administered 2016-05-17: 4 mg via ORAL

## 2016-05-17 NOTE — Progress Notes (Signed)
  Echocardiogram 2D Echocardiogram has been performed.  Janalyn HarderWest, Kedron Uno R 05/17/2016, 11:54 AM

## 2016-05-17 NOTE — Progress Notes (Addendum)
PROGRESS NOTE                                                                                                                                                                                                             Patient Demographics:    Sheri [name], is a 26 y.o. female, DOB - 09/09/1990, ZOX:[id number]  Admit date - 05/16/2016   Admitting Physician Michael LitterNikki Carter, MD  Outpatient Primary MD for the patient is Lorretta HarpPANOSH,WANDA KOTVAN, MD  LOS - 0  Outpatient Specialists:none  Chief Complaint  Patient presents with  . Loss of Consciousness       Brief Narrative   26 year old female with history of PE in 2011 (associated with OCPs, off anticoagulation), bipolar disorder/depression, [facility] with 24 hours history of headache with low-grade fever, extreme fatigue, nausea and chills. She reported body aches and several episodes of watery diarrhea. She also had one episode of near syncope that was witnessed by her boyfriend. In the ED she was found to be hypotensive and tachycardic. She was afebrile. Also complained of bandlike pressure around her epigastric area radiating to the sides for past few days. Blood work showed mild hypokalemia. CT angiogram of the chest done was negative for PE. EKG and initial troponin were unremarkable. Placed on observation for severe dehydration and evaluated for her near syncope..   Subjective:   Patient still feels weak but denies dizziness. Had 3-4 episodes of diarrhea since admission.   Assessment  & Plan :    Principal Problem:   Syncope and collapse Secondary to orthostasis with dehydration. Monitor with IV hydration. supportive care with antiemetics. Add Imodium. GI pathogen positive for norovirus. Flu PCR  Neg. Follow blood cx sent on admission. Does not take further cardiac workup would discontinue echo.    Hypokalemia Replenished  Epigastric  pain Associated with GI symptoms. Continue Pepcid.     Code Status : Full code  Family Communication  : Friend at bedside  Disposition Plan  : Home tomorrow if symptoms better  Barriers For Discharge : Improving symptoms, hypotension  Consults  : None  Procedures  :  CT angiogram of the chest  DVT Prophylaxis  :  Lovenox -   Lab Results  Component Value Date   PLT 310 05/16/2016    Antibiotics  :  Anti-infectives    None        Objective:   Vitals:   05/17/16 0100 05/17/16 0152 05/17/16 0425 05/17/16 0740  BP: 99/62 (!) 109/56  (!) 94/56  Pulse: 105 (!) 114  (!) 101  Resp: 15 16  16   Temp:  98.7 F (37.1 C) 98.5 F (36.9 C) 98.3 F (36.8 C)  TempSrc:  Oral Oral Oral  SpO2: 99% 99%    Weight:  90.2 kg (198 lb 14.4 oz)    Height:  5\' 3"  (1.6 m)      Wt Readings from Last 3 Encounters:  05/17/16 90.2 kg (198 lb 14.4 oz)  04/27/16 88 kg (194 lb)  02/21/16 87 kg (191 lb 12.8 oz)     Intake/Output Summary (Last 24 hours) at 05/17/16 1051 Last data filed at 05/17/16 0849  Gross per 24 hour  Intake             3240 ml  Output                0 ml  Net             3240 ml     Physical Exam  Gen: not in distress HEENT: no pallor, dry mucosa, supple neck Chest: clear b/l, no added sounds CVS: N S1&S2, no murmurs, GI: soft, NT, ND, BS+ Musculoskeletal: warm, no edema     Data Review:    CBC  Recent Labs Lab 05/16/16 1945  WBC 9.4  HGB 13.5  HCT 40.9  PLT 310  MCV 86.3  MCH 28.5  MCHC 33.0  RDW 13.2  LYMPHSABS 0.3*  MONOABS 0.8  EOSABS 0.0  BASOSABS 0.0    Chemistries   Recent Labs Lab 05/16/16 1945 05/17/16 0453  NA 136 139  K 3.3* 3.7  CL 102 109  CO2 25 21*  GLUCOSE 136* 86  BUN 17 8  CREATININE 0.81 0.71  CALCIUM 8.7* 7.8*  AST 21  --   ALT 17  --   ALKPHOS 40  --   BILITOT 0.6  --    ------------------------------------------------------------------------------------------------------------------ No  results for input(s): CHOL, HDL, LDLCALC, TRIG, CHOLHDL, LDLDIRECT in the last 72 hours.  Lab Results  Component Value Date   HGBA1C 5.2 04/27/2016   ------------------------------------------------------------------------------------------------------------------ No results for input(s): TSH, T4TOTAL, T3FREE, THYROIDAB in the last 72 hours.  Invalid input(s): FREET3 ------------------------------------------------------------------------------------------------------------------ No results for input(s): VITAMINB12, FOLATE, FERRITIN, TIBC, IRON, RETICCTPCT in the last 72 hours.  Coagulation profile  Recent Labs Lab 05/16/16 1945  INR 1.03    No results for input(s): DDIMER in the last 72 hours.  Cardiac Enzymes  Recent Labs Lab 05/16/16 1945  TROPONINI <0.03   ------------------------------------------------------------------------------------------------------------------ No results found for: BNP  Inpatient Medications  Scheduled Meds: . famotidine  20 mg Oral Daily  . sodium chloride flush  3 mL Intravenous Q12H   Continuous Infusions: . sodium chloride 125 mL/hr at 05/17/16 0541   PRN Meds:.ondansetron **OR** ondansetron (ZOFRAN) IV  Micro Results No results found for this or any previous visit (from the past 240 hour(s)).  Radiology Reports Ct Angio Chest Pe W And/or Wo Contrast  Result Date: 05/16/2016 CLINICAL DATA:  Chest pain and syncope. History of pulmonary embolism. EXAM: CT ANGIOGRAPHY CHEST WITH CONTRAST TECHNIQUE: Multidetector CT imaging of the chest was performed using the standard protocol during bolus administration of intravenous contrast. Multiplanar CT image reconstructions and MIPs were obtained to evaluate the vascular anatomy. CONTRAST:  100 cc Isovue  370 intravenous COMPARISON:  04/27/2016 FINDINGS: Cardiovascular: Satisfactory opacification of the pulmonary arteries to the segmental level. No evidence of pulmonary embolism. Normal heart  size. No pericardial effusion. Mediastinum/Nodes: Negative for mass or adenopathy. Lungs/Pleura: There is no edema, consolidation, effusion, or pneumothorax. Upper Abdomen: Negative Musculoskeletal: Negative Review of the MIP images confirms the above findings. IMPRESSION: Negative for pulmonary embolism or other acute finding. Electronically Signed   By: Marnee Spring M.D.   On: 05/16/2016 20:55   Ct Angio Chest W/cm &/or Wo Cm  Result Date: 04/27/2016 CLINICAL DATA:  Chest pain for 2 months. History of pulmonary embolus. EXAM: CT ANGIOGRAPHY CHEST WITH CONTRAST TECHNIQUE: Multidetector CT imaging of the chest was performed using the standard protocol during bolus administration of intravenous contrast. Multiplanar CT image reconstructions and MIPs were obtained to evaluate the vascular anatomy. CONTRAST:  100 cc Isovue 370 COMPARISON:  12/31/2015 chest radiograph FINDINGS: Breathing motion artifact and streak artifact from high density contrast in the SVC mildly reduce diagnostic sensitivity. Cardiovascular: No filling defect is identified in the pulmonary arterial tree to suggest pulmonary embolus. The the mild cardiomegaly. Mediastinum/Nodes: Small axillary and subpectoral lymph nodes are not pathologically enlarged. Otherwise unremarkable. Lungs/Pleura: Unremarkable Upper Abdomen: Unremarkable Musculoskeletal: Unremarkable Review of the MIP images confirms the above findings. IMPRESSION: 1. No pulmonary embolus or other acute thoracic abnormality is identified to explain the patient's current symptoms. Electronically Signed   By: Gaylyn Rong M.D.   On: 04/27/2016 17:46    Time Spent in minutes  25   Eddie North M.D on 05/17/2016 at 10:51 AM  Between 7am to 7pm - Pager - [phone]  After 7pm go to [email or web address] - password [facility]  [facility] -  Office  [phone]

## 2016-05-17 NOTE — H&P (Signed)
History and Physical    Sheri [name] UJW:[id number] DOB: 09/19/1990 DOA: 05/16/2016  PCP: Lorretta HarpPANOSH,WANDA KOTVAN, MD   [facility]  Chief Complaint: Flu like symptoms, diarrhea, syncope and collapse  HPI: Sheri [name] is a 26 y.o. woman with a prior history of polysubstance abuse, PE in 2011 associated with OCPs, and Bipolar disorder/depression who presented to the ED [facility] for evaluation of acute onset (within the 24 hours of presentation) of headache, extreme fatigue, low-grade fever to 99, nausea, and chills.  She has had body aches.  She had had diarrhea.  She had one near syncopal episode witnessed by her boyfriend.  She reports at least two other syncopal episodes that he did not see.  She recalls "waking up on the ground"; she believes she had temporary loss of consciousness.  No witnessed convulsion or seizure-like activity.  No confusion or post-ictal appearing state.  No palpitations.  She denies having any real warning or aura before passing out.  She took a flu shot two weeks ago.  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  ED Course: Pregnancy test normal.  UDS normal.  EKG shows sinus tachycardia.  Potassium 3.3; otherwise, her labs are unremarkable.  CTA chest PE protocol negative for PE.  No evidence of pericardial effusion either.  She is S/P 3L NS in the ED. Hemodynamic improved.  She still somewhat ill-appearing.  She has had watery diarrhea since transfer.  Review of Systems: Subacute left sided numbness and tingling in arm and leg.  Subacute LUQ pain.  Head to toe rash two weeks ago thought to be secondary to melatonin.  She stopped the supplement and treated with benadryl.  Otherwise, 10 systems reviewed and negative except as stated in the HPI.    Past Medical History:  Diagnosis Date  . Alcohol addiction (HCC)   . Bipolar depression (HCC)    under psych rx   . Depression   . Drug  addiction (HCC)   . Drug addiction in remission [facility](HCC)    heroin  . Pulmonary embolism (HCC) 2011   neg heme evaluation  felt to be from ocps     Past Surgical History:  Procedure Laterality Date  . NO PAST SURGERIES       reports that she has been smoking.  She has never used smokeless tobacco. She reports that she does not drink alcohol or use drugs.  Allergies  Allergen Reactions  . Bupropion Other (See Comments)    Family History  Problem Relation Age of Onset  . Colon cancer Maternal Grandfather   . Hypertension Father      Prior to Admission medications   Medication Sig Start Date End Date Taking? Authorizing Provider  aspirin 81 MG tablet Take 81 mg by mouth daily.    Historical Provider, MD  PARAGARD INTRAUTERINE COPPER IUD IUD 1 each by Intrauterine route once.    Historical Provider, MD    Physical Exam: Vitals:   05/17/16 0030 05/17/16 0100 05/17/16 0152 05/17/16 0425  BP: (!) 99/48 99/62 (!) 109/56   Pulse: 103 105 (!) 114   Resp: 22 15 16    Temp:   98.7 F (37.1 C) 98.5 F (36.9 C)  TempSrc:   Oral Oral  SpO2: 96% 99% 99%   Weight:   90.2 kg (198 lb 14.4 oz)   Height:   5\' 3"  (1.6 m)  Constitutional: NAD, calm, comfortable, somewhat ill appearing Vitals:   05/17/16 0030 05/17/16 0100 05/17/16 0152 05/17/16 0425  BP: (!) 99/48 99/62 (!) 109/56   Pulse: 103 105 (!) 114   Resp: 22 15 16    Temp:   98.7 F (37.1 C) 98.5 F (36.9 C)  TempSrc:   Oral Oral  SpO2: 96% 99% 99%   Weight:   90.2 kg (198 lb 14.4 oz)   Height:   5\' 3"  (1.6 m)    Eyes: PERRL, lids and conjunctivae normal ENMT: Mucous membranes are moist. Posterior pharynx clear of any exudate or lesions. Normal dentition.  Neck: normal appearance, supple Respiratory: clear to auscultation bilaterally, no wheezing, no crackles. Normal respiratory effort. No accessory muscle use.  Cardiovascular: Mildly tachycardic but regular.  No murmurs / rubs / gallops. No extremity edema. 2+  pedal pulses. GI: abdomen is soft and compressible but she has left sided tenderness.  No distention.  No guarding.  No rebound.  No masses palpated.  Bowel sounds are present. Musculoskeletal:  No joint deformity in upper and lower extremities. Good ROM, no contractures. Normal muscle tone.  Skin: no rashes, Skin is very warm to touch but she is not clammy/diaphoretic. Neurologic: CN 2-12 grossly intact. Sensation intact, Strength symmetric bilaterally, 5/5. Psychiatric: Normal judgment and insight. Alert and oriented x 3. Normal mood.     Labs on Admission: I have personally reviewed following labs and imaging studies  CBC:  Recent Labs Lab 05/16/16 1945  WBC 9.4  NEUTROABS 8.3*  HGB 13.5  HCT 40.9  MCV 86.3  PLT 310   Basic Metabolic Panel:  Recent Labs Lab 05/16/16 1945  NA 136  K 3.3*  CL 102  CO2 25  GLUCOSE 136*  BUN 17  CREATININE 0.81  CALCIUM 8.7*   GFR: Estimated Creatinine Clearance: 112.2 mL/min (by C-G formula based on SCr of 0.81 mg/dL). Liver Function Tests:  Recent Labs Lab 05/16/16 1945  AST 21  ALT 17  ALKPHOS 40  BILITOT 0.6  PROT 7.5  ALBUMIN 4.0    Recent Labs Lab 05/16/16 1945  LIPASE 17   Coagulation Profile:  Recent Labs Lab 05/16/16 1945  INR 1.03   Cardiac Enzymes:  Recent Labs Lab 05/16/16 1945  TROPONINI <0.03   Urine analysis:    Component Value Date/Time   COLORURINE YELLOW 05/16/2016 1930   APPEARANCEUR CLEAR 05/16/2016 1930   LABSPEC 1.027 05/16/2016 1930   PHURINE 7.0 05/16/2016 1930   GLUCOSEU NEGATIVE 05/16/2016 1930   HGBUR NEGATIVE 05/16/2016 1930   BILIRUBINUR NEGATIVE 05/16/2016 1930   KETONESUR NEGATIVE 05/16/2016 1930   PROTEINUR NEGATIVE 05/16/2016 1930   NITRITE NEGATIVE 05/16/2016 1930   LEUKOCYTESUR NEGATIVE 05/16/2016 1930   Sepsis Labs:  Lactic acid level 1.28  Radiological Exams on Admission: Ct Angio Chest Pe W And/or Wo Contrast  Result Date: 05/16/2016 CLINICAL DATA:  Chest  pain and syncope. History of pulmonary embolism. EXAM: CT ANGIOGRAPHY CHEST WITH CONTRAST TECHNIQUE: Multidetector CT imaging of the chest was performed using the standard protocol during bolus administration of intravenous contrast. Multiplanar CT image reconstructions and MIPs were obtained to evaluate the vascular anatomy. CONTRAST:  100 cc Isovue 370 intravenous COMPARISON:  04/27/2016 FINDINGS: Cardiovascular: Satisfactory opacification of the pulmonary arteries to the segmental level. No evidence of pulmonary embolism. Normal heart size. No pericardial effusion. Mediastinum/Nodes: Negative for mass or adenopathy. Lungs/Pleura: There is no edema, consolidation, effusion, or pneumothorax. Upper Abdomen: Negative Musculoskeletal: Negative Review of the MIP images  confirms the above findings. IMPRESSION: Negative for pulmonary embolism or other acute finding. Electronically Signed   By: Marnee Spring M.D.   On: 05/16/2016 20:55    EKG: Independently reviewed. Noted above.  Assessment/Plan Principal Problem:   Syncope and collapse Active Problems:   Flu-like symptoms   Hypokalemia      Flu-like symptoms --Low threshold to start Tamiflu if she has a fever, despite negative flu screen --Continue droplet precautions for now --NS at 125 cc/hr  Diarrhea --GI pathogens panel --Enteric precautions --I did not add C diff because she does not have recent antibiotic exposure or a leukocytosis. --NS at 125 cc/hr --Diet as tolerated  Low grade fever with tachycardia and sepsis --Will add blood cultures though I remain most suspicious for viral illness  Syncope, in the setting of acute illness but her description of LOC without warning is worrisome for cardiac etiology --Telemetry monitoring --Echo ordered (may be done as outpatient if she is discharged later today) --Consider outpatient Holter as well for further screening for arrhythmia  Probable GERD --Pepcid.  Recommended GI referral  from PCP.  Subacute neurological complaints. --Recommend neurology referral as well.   DVT prophylaxis: Early ambulation Code Status: FULL Family Communication: Boyfriend at bedside at time of admission. Disposition Plan: Expect she will go home with outpatient follow-up. Consults called: NONE Admission status: Place in observation with telemetry monitoring.   TIME SPENT: 60 minutes   Jerene Bears MD [facility] Pager [phone]  If 7PM-7AM, please contact night-coverage [email or web address] Password [facility]  05/17/2016, 4:34 AM

## 2016-05-17 NOTE — Progress Notes (Signed)
Pt arrived from [facility] to [facility]. Pt vital signs stable. Pt placed on tele and oriented to room. Plan of care reviewed with pt and pt's partner. MD notified. Pt has no complaints at this time. Will continue to follow.

## 2016-05-18 DIAGNOSIS — A0811 Acute gastroenteropathy due to Norwalk agent: Secondary | ICD-10-CM | POA: Diagnosis not present

## 2016-05-18 DIAGNOSIS — R55 Syncope and collapse: Secondary | ICD-10-CM | POA: Diagnosis not present

## 2016-05-18 DIAGNOSIS — E876 Hypokalemia: Secondary | ICD-10-CM

## 2016-05-18 DIAGNOSIS — I951 Orthostatic hypotension: Secondary | ICD-10-CM

## 2016-05-18 DIAGNOSIS — E860 Dehydration: Secondary | ICD-10-CM | POA: Diagnosis not present

## 2016-05-18 HISTORY — DX: Orthostatic hypotension: I95.1

## 2016-05-18 HISTORY — DX: Acute gastroenteropathy due to Norwalk agent: A08.11

## 2016-05-18 LAB — GLUCOSE, CAPILLARY: GLUCOSE-CAPILLARY: 82 mg/dL (ref 65–99)

## 2016-05-18 LAB — URINE CULTURE

## 2016-05-18 MED ORDER — ONDANSETRON HCL 4 MG PO TABS: 4.0000 mg | ORAL_TABLET | Freq: Four times a day (QID) | ORAL | 0 refills | Status: DC | PRN

## 2016-05-18 MED ORDER — KETOROLAC TROMETHAMINE 30 MG/ML IJ SOLN
30.0000 mg | Freq: Once | INTRAMUSCULAR | Status: AC
  Administered 2016-05-18: 30 mg via INTRAVENOUS
  Filled 2016-05-18: qty 1

## 2016-05-18 NOTE — Progress Notes (Signed)
Patient discharged per MD order. Iv removed, telemetry d/c, AVS completed with patient. No questions at this time. To home with significant other.  Minerva Endsiffany N Jihan Mellette RN

## 2016-05-18 NOTE — Discharge Instructions (Signed)
Norovirus Infection °A norovirus infection is caused by exposure to a virus in a group of similar viruses (noroviruses). This type of infection causes inflammation in your stomach and intestines (gastroenteritis). Norovirus is the most common cause of gastroenteritis. It also causes food poisoning. °Anyone can get a norovirus infection. It spreads very easily (contagious). You can get it from contaminated food, water, surfaces, or other people. Norovirus is found in the stool or vomit of infected people. You can spread the infection as soon as you feel sick until 2 weeks after you recover.  °Symptoms usually begin within 2 days after you become infected. Most norovirus symptoms affect the digestive system. °CAUSES °Norovirus infection is caused by contact with norovirus. You can catch norovirus if you: °· Eat or drink something contaminated with norovirus. °· Touch surfaces or objects contaminated with norovirus and then put your hand in your mouth. °· Have direct contact with an infected person who has symptoms. °· Share food, drink, or utensils with someone with who is sick with norovirus. °SIGNS AND SYMPTOMS °Symptoms of norovirus may include: °· Nausea. °· Vomiting. °· Diarrhea. °· Stomach cramps. °· Fever. °· Chills. °· Headache. °· Muscle aches. °· Tiredness. °DIAGNOSIS °Your health care provider may suspect norovirus based on your symptoms and physical exam. Your health care provider may also test a sample of your stool or vomit for the virus.  °TREATMENT °There is no specific treatment for norovirus. Most people get better without treatment in about 2 days. °HOME CARE INSTRUCTIONS °· Replace lost fluids by drinking plenty of water or rehydration fluids containing important minerals called electrolytes. This prevents dehydration. Drink enough fluid to keep your urine clear or pale yellow. °· Do not prepare food for others while you are infected. Wait at least 3 days after recovering from the illness to do  that. °PREVENTION  °· Wash your hands often, especially after using the toilet or changing a diaper. °· Wash fruits and vegetables thoroughly before preparing or serving them. °· Throw out any food that a sick person may have touched. °· Disinfect contaminated surfaces immediately after someone in the household has been sick. Use a bleach-based household cleaner. °· Immediately remove and wash soiled clothes or sheets. °SEEK MEDICAL CARE IF: °· Your vomiting, diarrhea, and stomach pain is getting worse. °· Your symptoms of norovirus do not go away after 2-3 days. °SEEK IMMEDIATE MEDICAL CARE IF:  °You develop symptoms of dehydration that do not improve with fluid replacement. This may include: °· Excessive sleepiness. °· Lack of tears. °· Dry mouth. °· Dizziness when standing. °· Weak pulse. °This information is not intended to replace advice given to you by your health care provider. Make sure you discuss any questions you have with your health care provider. °Document Released: 06/27/2002 Document Revised: 04/27/2014 Document Reviewed: 09/14/2013 °Elsevier Interactive Patient Education © 2017 [employer] Inc[employer] ° °

## 2016-05-18 NOTE — Discharge Summary (Signed)
Physician Discharge Summary  Jaynie CollinsCassandra Grace Craig WUJ:[id number] DOB: 02/22/1991 DOA: 05/16/2016  PCP: Lorretta HarpPANOSH,WANDA KOTVAN, MD  Admit date: 05/16/2016 Discharge date: 05/18/2016  Admitted From: home Disposition:home  Recommendations for Outpatient Follow-up:  1. Follow up with PCP in 1-2 weeks. Please evaluate patient needs outpatient referral to gastroenterologist given her ongoing GERD symptoms. 2. Please follow blood cultures results sent on admission.   Home Health: none Equipment/Devices:None  Discharge Condition: Fair CODE STATUS: Full code Diet recommendation: Regular    Discharge Diagnoses:  Principal Problem:   Syncope and collapse  Active Problems:   Bipolar depression (HCC)   Flu-like symptoms   Hypokalemia   Enteritis due to Norovirus   Dehydration   Orthostatic hypotension  Brief narrative/history of present illness 26 year old female with history of PE in 2011 (associated with OCPs, off anticoagulation), bipolar disorder/depression, [facility] with 24 hours history of headache with low-grade fever, extreme fatigue, nausea and chills. She reported body aches and several episodes of watery diarrhea. She also had one episode of near syncope that was witnessed by her boyfriend. In the ED she was found to be hypotensive and tachycardic. She was afebrile. Also complained of bandlike pressure around her epigastric area radiating to the sides for past few days. Blood work showed mild hypokalemia. CT angiogram of the chest done was negative for PE. EKG and initial troponin were unremarkable. Placed on observation for severe dehydration and evaluated for her near syncope.  Hospital course    Principal Problem:   Syncope and collapse Secondary to orthostasis with dehydration. GI panel positive for norovirus. Impotence improved with IV hydration and antiemetics. No further diarrhea. Flu PCR negative. supportive care with antiemetics. Add  Imodium. GI pathogen positive for norovirus. Flu PCR Neg. Follow blood cx sent on admission. 2-D echo was done which was unremarkable. Has been stable on telemetry. Patient instructed on keeping herself hydrated at home, frequent handwashing (especially after using the toilet) wash fruits and vegetables thoroughly before preparing or serving them.   Patient can be discharged home with outpatient follow-up.    Hypokalemia Replenished  Epigastric pain Suspect patient has ongoing GERD symptoms. Reports that she has been on Nexium in the past. Recommend follow-up with her PCP and can be referred to gastroenterologist.      Family Communication  :  boyfriend at bedside  Disposition Plan  : Home   Consults  : None  Discharge Instructions   Allergies as of 05/18/2016      Reactions   Bupropion Other (See Comments)      Medication List    TAKE these medications   aspirin 81 MG tablet Take 81 mg by mouth daily.   ondansetron 4 MG tablet Commonly known as:  ZOFRAN Take 1 tablet (4 mg total) by mouth every 6 (six) hours as needed for nausea.   PARAGARD INTRAUTERINE COPPER Iud IUD 1 each by Intrauterine route once.      Follow-up Information    Lorretta HarpPANOSH,WANDA KOTVAN, MD. Schedule an appointment as soon as possible for a visit in 2 week(s).   Specialties:  Internal Medicine, Pediatrics Contact information: [address] [place] [address] [phone]          Allergies  Allergen Reactions  . Bupropion Other (See Comments)      Procedures/Studies: Ct Angio Chest Pe W And/or Wo Contrast  Result Date: 05/16/2016 CLINICAL DATA:  Chest pain and syncope. History of pulmonary embolism. EXAM: CT ANGIOGRAPHY CHEST WITH CONTRAST  TECHNIQUE: Multidetector CT imaging of the chest was performed using the standard protocol during bolus administration of intravenous contrast. Multiplanar CT image reconstructions and MIPs were obtained to evaluate the vascular  anatomy. CONTRAST:  100 cc Isovue 370 intravenous COMPARISON:  04/27/2016 FINDINGS: Cardiovascular: Satisfactory opacification of the pulmonary arteries to the segmental level. No evidence of pulmonary embolism. Normal heart size. No pericardial effusion. Mediastinum/Nodes: Negative for mass or adenopathy. Lungs/Pleura: There is no edema, consolidation, effusion, or pneumothorax. Upper Abdomen: Negative Musculoskeletal: Negative Review of the MIP images confirms the above findings. IMPRESSION: Negative for pulmonary embolism or other acute finding. Electronically Signed   By: Marnee Spring M.D.   On: 05/16/2016 20:55   Ct Angio Chest W/cm &/or Wo Cm  Result Date: 04/27/2016 CLINICAL DATA:  Chest pain for 2 months. History of pulmonary embolus. EXAM: CT ANGIOGRAPHY CHEST WITH CONTRAST TECHNIQUE: Multidetector CT imaging of the chest was performed using the standard protocol during bolus administration of intravenous contrast. Multiplanar CT image reconstructions and MIPs were obtained to evaluate the vascular anatomy. CONTRAST:  100 cc Isovue 370 COMPARISON:  12/31/2015 chest radiograph FINDINGS: Breathing motion artifact and streak artifact from high density contrast in the SVC mildly reduce diagnostic sensitivity. Cardiovascular: No filling defect is identified in the pulmonary arterial tree to suggest pulmonary embolus. The the mild cardiomegaly. Mediastinum/Nodes: Small axillary and subpectoral lymph nodes are not pathologically enlarged. Otherwise unremarkable. Lungs/Pleura: Unremarkable Upper Abdomen: Unremarkable Musculoskeletal: Unremarkable Review of the MIP images confirms the above findings. IMPRESSION: 1. No pulmonary embolus or other acute thoracic abnormality is identified to explain the patient's current symptoms. Electronically Signed   By: Gaylyn Rong M.D.   On: 04/27/2016 17:46       Subjective: Feels much better except for occasional pressure over the epigastric  area.  Discharge Exam: Vitals:   05/17/16 2025 05/18/16 0524  BP: (!) 96/53 (!) 95/46  Pulse: 94 85  Resp: 18 18  Temp: 98.1 F (36.7 C) 99.2 F (37.3 C)   Vitals:   05/17/16 1500 05/17/16 1900 05/17/16 2025 05/18/16 0524  BP: 110/61 (!) 96/44 (!) 96/53 (!) 95/46  Pulse: 100 77 94 85  Resp: 18 18 18 18   Temp: (!) 100.5 F (38.1 C) 100.1 F (37.8 C) 98.1 F (36.7 C) 99.2 F (37.3 C)  TempSrc: Oral Oral Oral Oral  SpO2:   99% 98%  Weight:    90 kg (198 lb 8 oz)  Height:        General: Young female not in distress HEENT: Moist mucosa, supple neck Chest: Clear to auscultation bilaterally CVS: Normal S1 and S2, no murmurs rub or gallop GI: Soft, nondistended, nontender, bowel sounds present Musculoskeletal : Warm, no edema    The results of significant diagnostics from this hospitalization (including imaging, microbiology, ancillary and laboratory) are listed below for reference.     Microbiology: Recent Results (from the past 240 hour(s))  Gastrointestinal Panel by PCR , Stool     Status: Abnormal   Collection Time: 05/17/16  4:59 AM  Result Value Ref Range Status   Campylobacter species NOT DETECTED NOT DETECTED Final   Plesimonas shigelloides NOT DETECTED NOT DETECTED Final   Salmonella species NOT DETECTED NOT DETECTED Final   Yersinia enterocolitica NOT DETECTED NOT DETECTED Final   Vibrio species NOT DETECTED NOT DETECTED Final   Vibrio cholerae NOT DETECTED NOT DETECTED Final   Enteroaggregative E coli (EAEC) NOT DETECTED NOT DETECTED Final   Enteropathogenic E coli (EPEC) NOT DETECTED  NOT DETECTED Final   Enterotoxigenic E coli (ETEC) NOT DETECTED NOT DETECTED Final   Shiga like toxin producing E coli (STEC) NOT DETECTED NOT DETECTED Final   Shigella/Enteroinvasive E coli (EIEC) NOT DETECTED NOT DETECTED Final   Cryptosporidium NOT DETECTED NOT DETECTED Final   Cyclospora cayetanensis NOT DETECTED NOT DETECTED Final   Entamoeba histolytica NOT DETECTED  NOT DETECTED Final   Giardia lamblia NOT DETECTED NOT DETECTED Final   Adenovirus F40/41 NOT DETECTED NOT DETECTED Final   Astrovirus NOT DETECTED NOT DETECTED Final   Norovirus GI/GII DETECTED (A) NOT DETECTED Final    Comment: RESULT CALLED TO, READ BACK BY AND VERIFIED WITH: THERESA MAINIERL ON 05/17/16 AT 1050 BY [facility]    Rotavirus A NOT DETECTED NOT DETECTED Final   Sapovirus (I, II, IV, and V) NOT DETECTED NOT DETECTED Final     Labs: BNP (last 3 results) No results for input(s): BNP in the last 8760 hours. Basic Metabolic Panel:  Recent Labs Lab 05/16/16 1945 05/17/16 0453  NA 136 139  K 3.3* 3.7  CL 102 109  CO2 25 21*  GLUCOSE 136* 86  BUN 17 8  CREATININE 0.81 0.71  CALCIUM 8.7* 7.8*   Liver Function Tests:  Recent Labs Lab 05/16/16 1945  AST 21  ALT 17  ALKPHOS 40  BILITOT 0.6  PROT 7.5  ALBUMIN 4.0    Recent Labs Lab 05/16/16 1945  LIPASE 17   No results for input(s): AMMONIA in the last 168 hours. CBC:  Recent Labs Lab 05/16/16 1945  WBC 9.4  NEUTROABS 8.3*  HGB 13.5  HCT 40.9  MCV 86.3  PLT 310   Cardiac Enzymes:  Recent Labs Lab 05/16/16 1945  TROPONINI <0.03   BNP: Invalid input(s): POCBNP CBG:  Recent Labs Lab 05/17/16 0633 05/18/16 0640  GLUCAP 91 82   D-Dimer No results for input(s): DDIMER in the last 72 hours. Hgb A1c No results for input(s): HGBA1C in the last 72 hours. Lipid Profile No results for input(s): CHOL, HDL, LDLCALC, TRIG, CHOLHDL, LDLDIRECT in the last 72 hours. Thyroid function studies No results for input(s): TSH, T4TOTAL, T3FREE, THYROIDAB in the last 72 hours.  Invalid input(s): FREET3 Anemia work up No results for input(s): VITAMINB12, FOLATE, FERRITIN, TIBC, IRON, RETICCTPCT in the last 72 hours. Urinalysis    Component Value Date/Time   COLORURINE YELLOW 05/16/2016 1930   APPEARANCEUR CLEAR 05/16/2016 1930   LABSPEC 1.027 05/16/2016 1930   PHURINE 7.0 05/16/2016 1930   GLUCOSEU  NEGATIVE 05/16/2016 1930   HGBUR NEGATIVE 05/16/2016 1930   BILIRUBINUR NEGATIVE 05/16/2016 1930   KETONESUR NEGATIVE 05/16/2016 1930   PROTEINUR NEGATIVE 05/16/2016 1930   NITRITE NEGATIVE 05/16/2016 1930   LEUKOCYTESUR NEGATIVE 05/16/2016 1930   Sepsis Labs Invalid input(s): PROCALCITONIN,  WBC,  LACTICIDVEN Microbiology Recent Results (from the past 240 hour(s))  Gastrointestinal Panel by PCR , Stool     Status: Abnormal   Collection Time: 05/17/16  4:59 AM  Result Value Ref Range Status   Campylobacter species NOT DETECTED NOT DETECTED Final   Plesimonas shigelloides NOT DETECTED NOT DETECTED Final   Salmonella species NOT DETECTED NOT DETECTED Final   Yersinia enterocolitica NOT DETECTED NOT DETECTED Final   Vibrio species NOT DETECTED NOT DETECTED Final   Vibrio cholerae NOT DETECTED NOT DETECTED Final   Enteroaggregative E coli (EAEC) NOT DETECTED NOT DETECTED Final   Enteropathogenic E coli (EPEC) NOT DETECTED NOT DETECTED Final   Enterotoxigenic E coli (ETEC) NOT DETECTED  NOT DETECTED Final   Shiga like toxin producing E coli (STEC) NOT DETECTED NOT DETECTED Final   Shigella/Enteroinvasive E coli (EIEC) NOT DETECTED NOT DETECTED Final   Cryptosporidium NOT DETECTED NOT DETECTED Final   Cyclospora cayetanensis NOT DETECTED NOT DETECTED Final   Entamoeba histolytica NOT DETECTED NOT DETECTED Final   Giardia lamblia NOT DETECTED NOT DETECTED Final   Adenovirus F40/41 NOT DETECTED NOT DETECTED Final   Astrovirus NOT DETECTED NOT DETECTED Final   Norovirus GI/GII DETECTED (A) NOT DETECTED Final    Comment: RESULT CALLED TO, READ BACK BY AND VERIFIED WITH: THERESA MAINIERL ON 05/17/16 AT 1050 BY [facility]    Rotavirus A NOT DETECTED NOT DETECTED Final   Sapovirus (I, II, IV, and V) NOT DETECTED NOT DETECTED Final     Time coordinating discharge: <30 minutes  SIGNED:   Eddie North, MD  [facility] 05/18/2016, 9:49 AM Pager   If 7PM-7AM, please contact  night-coverage [email or web address] Password [email or web address]

## 2016-05-22 LAB — CULTURE, BLOOD (ROUTINE X 2)
Culture: NO GROWTH
Culture: NO GROWTH

## 2016-06-02 ENCOUNTER — Ambulatory Visit (INDEPENDENT_AMBULATORY_CARE_PROVIDER_SITE_OTHER): Payer: BLUE CROSS/BLUE SHIELD | Admitting: Sports Medicine

## 2016-06-02 ENCOUNTER — Encounter: Payer: Self-pay | Admitting: Sports Medicine

## 2016-06-02 VITALS — BP 121/62 | Ht 63.0 in | Wt 193.0 lb

## 2016-06-02 DIAGNOSIS — G540 Brachial plexus disorders: Secondary | ICD-10-CM

## 2016-06-02 MED ORDER — AMITRIPTYLINE HCL 25 MG PO TABS: 25.0000 mg | ORAL_TABLET | Freq: Every day | ORAL | 0 refills | Status: DC

## 2016-06-02 NOTE — Patient Instructions (Signed)
Do the home exercise program which included: H and T stretches, posturing, and 3 exercises on form Medication sent to pharmacy  Believe this is due to stretching of your nerves

## 2016-06-02 NOTE — Progress Notes (Signed)
  Sheri CollinsCassandra [name] - 26 y.o. female MRN [id number]  Date of birth: 10/02/1990  SUBJECTIVE:   CC: L. Arm pain  HPI:  Patient states that About 8 Months Ago She Started Having Shooting Sharp Pain in Her Back and Her Shoulder. She Describes As Achy. Pain Has since Progressed and Gotten Worse. She's Been Taking It Easy Which Has Helped Some. Pain Starts in Her Neck and Radiates down to [place] Her Forearm. Occurs Bilaterally but Left Greater Than Right. Pain Is Worse at Night. Denies Any Injury. 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 States That Stretching Exercises Makes Pain Worse As Well. Ibuprofen Did Not Help Much.  ROS  No cough or sneeze pain No weakness in hands   HISTORY: Past Medical, Surgical, Social, and Family History Reviewed & Updated per EMR.   Pertinent Historical Findings include: Bipolar disorder, PCOS   PHYSICAL EXAM:  BP 121/62   Ht 5\' 3"  (1.6 m)   Wt 193 lb (87.5 kg)   BMI 34.19 kg/m   General: alert, well-developed, NAD, cooperative Msk: no joint swelling, no joint warmth, and no redness over joints.  Pulses: Carotid/Radial  full and equal bilaterally Neurologic: No focal deficits, sensation grossly intact, A&Ox3. Deep tendon reflexes symmetrical and normal in upper extremities. Skin: Intact without suspicious lesions or rashes. Warm and dry. Psych: Mood and affect are normal; no evidence of anxiety or depression.  Normal neck exam. Negative Spurling. Good ROM. No muscle spasm appreciated. No tenderness to palpation over cervical spine.  Normal shoulder exam. Strength in bilateral shoulders normal. Normal ROM. No focal tenderness appreciated.    ASSESSMENT & PLAN:   1. Brachial plexus neuropathy Radicular pain most consistent with a brachial plexus neurapraxia. This likely occurred due to injury from over stretching when picking up the children she watches while nannying. Neurovascularly  intact. No red flags. Etiology does not appear to be from neck (DJD, discogenic) or shoulder.  -Amitriptyline 25 mg at night -HEP initiated to include H&T stretches, posturing, upright row, lawnmower, and robbery (handout given) -should be improved in 1-3 months -follow-up as needed  Caryl AdaJazma Jayko Voorhees, DO 06/02/2016, 3:49 PM PGY-3, [facility]  I observed and examined the patient with the resident and agree with assessment and plan.  Note reviewed and modified by me. Enid BaasKarl Fields, MD

## 2016-06-03 NOTE — Progress Notes (Signed)
Pre visit review using our clinic review tool, if applicable. No additional management support is needed unless otherwise documented below in the visit note.   Chief Complaint  Patient presents with  . Follow-up    HPI: Sheri [name] 26 y.o. come in forfu  hopsitalization for syncope with evaluation  And  Gi ilness positive fro NOROVIRUS    And neg eval for PE   Admitted jan 28th  Her syncope with sudden in occurred before any GI illness area she had a negative evaluation for clotting and echo. Has a follow-up appointment with cardiology the end of the month. She doesn't remember feeling faint before she passed out. Since she's been home from the hospital   After home hurt to eat and  Now trying to eat and pain comes  Left upper  And  After eating  Hard to lay on it .  Has  heart burn all the time   . And burping.  Uncertain if foods  Adding   Asa  Every day  after her PE. Stopped in past and helped GI symptoms  But went back on    X 2 days  and worse. Again   Some mid chest heartburn with some tightness but no food getting caught.  States that she walks around with "contatiners   Of tums." taking about  3 x per day . Did nexium for 2 months  Helped HB but not  abd pain  Ranitidine pepcid no help    No current blood in stool diarrhea vomiting unintentional weight loss.    ROS: See pertinent positives and negatives per HPI Saw dr  Darrick Penna    This week for  BP neuropthay and place on elavil  See note Past Medical History:  Diagnosis Date  . Alcohol addiction (HCC)   . Bipolar depression (HCC)    under psych rx   . Depression   . Drug addiction (HCC)   . Drug addiction in remission [facility])    heroin  . Pulmonary embolism (HCC) 2011   neg heme evaluation  felt to be from ocps     Family History  Problem Relation Age of Onset  . Colon cancer Maternal Grandfather   . Hypertension Father     Social History   Social History  . Marital status: Single    Spouse name: N/A    . Number of children: N/A  . Years of education: N/A   Occupational History  . nanny    Social History Main Topics  . Smoking status: Light Tobacco Smoker  . Smokeless tobacco: Never Used  . Alcohol use No  . Drug use: No  . Sexual activity: Not Currently    Partners: Male   Other Topics Concern  . 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 amitriptyline (ELAVIL) 25 MG tablet Take 1 tablet (25 mg total) by mouth at bedtime. 30 tablet 0  . aspirin 81 MG tablet Take 81 mg by mouth daily.    . ondansetron (ZOFRAN) 4 MG tablet Take 1 tablet (  4 mg total) by mouth every 6 (six) hours as needed for nausea. 15 tablet 0  . PARAGARD INTRAUTERINE COPPER IUD IUD 1 each by Intrauterine route once.     No facility-administered medications prior to visit.      EXAM:  BP 120/72 (BP Location: Right Arm, Patient Position: Sitting, Cuff Size: Normal)   Temp 98.2 F (36.8 C) (Oral)   Wt 190 lb (86.2 kg)   BMI 33.66 kg/m   Body mass index is 33.66 kg/m.  GENERAL: vitals reviewed and listed above, alert, oriented, appears well hydrated and in no acute distress HEENT: atraumatic, conjunctiva  clear, no obvious abnormalities on inspection of external nose and earsNECK: no obvious masses on inspection palpation  LUNGS: clear to auscultation bilaterally, no wheezes, rales or rhonchi, good air movement CV: HRRR, no clubbing cyanosis or  peripheral edema nl cap refill  Abdomen:  Sof,t normal bowel sounds without hepatosplenomegaly, no guarding rebound or masses tender LUQ area  And mid left epigastrum  no CVA tenderness MS: moves all extremities without noticeable focal   abnormality PSYCH: pleasant and cooperative, no obvious depression or anxiety Lab Results  Component Value Date   WBC 9.4 05/16/2016   HGB 13.5 05/16/2016   HCT 40.9 05/16/2016   PLT 310 05/16/2016   GLUCOSE 86 05/17/2016   CHOL 196 04/21/2016   TRIG 164.0 (H) 04/21/2016   HDL 47.80 04/21/2016   LDLDIRECT 150.0 02/13/2015   LDLCALC 115 (H) 04/21/2016   ALT 17 05/16/2016   AST 21 05/16/2016   NA 139 05/17/2016   K 3.7 05/17/2016   CL 109 05/17/2016   CREATININE 0.71 05/17/2016   BUN 8 05/17/2016   CO2 21 (L) 05/17/2016   TSH 2.44 04/21/2016   INR 1.03 05/16/2016   HGBA1C 5.2 04/27/2016   BP Readings from Last 3 Encounters:  06/04/16 120/72  06/02/16 121/62  05/18/16 (!) 95/46   Wt Readings from Last 3 Encounters:  06/04/16 190 lb (86.2 kg)  06/02/16 193 lb (87.5 kg)  05/18/16 198 lb 8 oz (90 kg)     ASSESSMENT AND PLAN:  Discussed the following assessment and plan:  Left sided abdominal pain - Plan: Helicobacter pylori antigen det, stool, Helicobacter pylori antigen det, stool, Ambulatory referral to Gastroenterology  Gastroesophageal reflux disease, esophagitis presence not specified - Plan: Helicobacter pylori antigen det, stool, Helicobacter pylori antigen det, stool, Ambulatory referral to Gastroenterology  Hx of syncope Hx pos test  norovirus  Nl eval otherwise   Overall she has heartburn symptoms sound fairly severe and epigastric left upper quadrant pain that is worse postprandial. She could have an ulcer or gastritis in addition to her acid disease. Agree with stopping the aspirin as her benefit is much less than her risk at this time as she had clotting on hormones. Reviewed record collects H pylori stool test and then begin an acid blocking  regimen and refer to GI for further evaluation.  Agree with follow-up with cardiology no evidence of QT syndrome or arrhythmia but be best to have cardiology opinion. -Patient advised to return or notify health care  team  if  new concerns arise.  Patient Instructions  Consideration of gastritis or even ulcer in addition to your heartburn acid disease. Stop the aspirin think the risk is more than a benefit at this time.  Collect an H. pylori stool test   after collect it began protonix  once a day. Limit tums as possible  Can add to rebound acid  He will be contacted about a consult from gastroenterology about your GI symptoms. If you're getting worse contact us. Keep your appointment with Dr. Delton See cardiology for reasons we discussed. Glad you are feeling better otherwise.     Neta Mends. Koron Godeaux M.D.   DC summary  Discharge Diagnoses:  Principal Problem:   Syncope and collapse  Active Problems:   Bipolar depression (HCC)   Flu-like symptoms   Hypokalemia   Enteritis due to Norovirus   Dehydration   Orthostatic hypotension  Brief narrative/history of present illness 26 year old female with history of PE in 2011 (associated with OCPs, off anticoagulation), bipolar disorder/depression, [facility] with 24 hours history of headache with low-grade fever, extreme fatigue, nausea and chills. She reported body aches and several episodes of watery diarrhea. She also had one episode of near syncope that was witnessed by her boyfriend. In the ED she was found to be hypotensive and tachycardic. She was afebrile. Also complained of bandlike pressure around her epigastric area radiating to the sides for past few days. Blood work showed mild hypokalemia. CT angiogram of the chest done was negative for PE. EKG and initial troponin were unremarkable. Placed on observation for severe dehydration and evaluated for her near syncope.  Hospital course   Principal Problem: Syncope and collapse Secondary to orthostasis with dehydration. GI panel positive for norovirus. Impotence improved with IV hydration and antiemetics. No further diarrhea. Flu PCR negative.  supportivecare with antiemetics. Add Imodium. GI pathogen positive for norovirus. Flu PCR Neg. Follow blood cx sent on admission. 2-D echo was done which was unremarkable. Has been stable on telemetry. Patient instructed on keeping herself hydrated at home, frequent handwashing (especially after using the toilet) wash fruits and vegetables thoroughly before preparing or serving them.   Patient can be discharged home with outpatient follow-up.  Hypokalemia Replenished  Epigastric pain Suspect patient has ongoing GERD symptoms. Reports that she has been on Nexium in the past. Recommend follow-up with her PCP and can be referred to gastroenterologist.      Family Communication : boyfriend at bedside  Disposition Plan:Home   Consults :None  Discharge Instructions

## 2016-06-04 ENCOUNTER — Ambulatory Visit (INDEPENDENT_AMBULATORY_CARE_PROVIDER_SITE_OTHER): Payer: BLUE CROSS/BLUE SHIELD | Admitting: Internal Medicine

## 2016-06-04 ENCOUNTER — Encounter: Payer: Self-pay | Admitting: Internal Medicine

## 2016-06-04 ENCOUNTER — Other Ambulatory Visit: Payer: Self-pay | Admitting: Internal Medicine

## 2016-06-04 VITALS — BP 120/72 | Temp 98.2°F | Wt 190.0 lb

## 2016-06-04 DIAGNOSIS — K219 Gastro-esophageal reflux disease without esophagitis: Secondary | ICD-10-CM

## 2016-06-04 DIAGNOSIS — R109 Unspecified abdominal pain: Secondary | ICD-10-CM

## 2016-06-04 DIAGNOSIS — Z87898 Personal history of other specified conditions: Secondary | ICD-10-CM | POA: Diagnosis not present

## 2016-06-04 MED ORDER — PANTOPRAZOLE SODIUM 40 MG PO TBEC: 40.0000 mg | DELAYED_RELEASE_TABLET | Freq: Every day | ORAL | 1 refills | Status: DC

## 2016-06-04 NOTE — Patient Instructions (Addendum)
Consideration of gastritis or even ulcer in addition to your heartburn acid disease. Stop the aspirin think the risk is more than a benefit at this time.  Collect an H. pylori stool test   after collect it began protonix  once a day. Limit tums as possible  Can add to rebound acid  He will be contacted about a consult from gastroenterology about your GI symptoms. If you're getting worse contact us. Keep your appointment with Dr. Delton SeeNelson cardiology for reasons we discussed. Glad you are feeling better otherwise.

## 2016-06-08 LAB — HELICOBACTER PYLORI  SPECIAL ANTIGEN: H. PYLORI ANTIGEN STOOL: NOT DETECTED

## 2016-06-11 ENCOUNTER — Encounter: Payer: Self-pay | Admitting: Internal Medicine

## 2016-06-11 MED ORDER — PREDNISONE 10 MG PO TABS: ORAL_TABLET | ORAL | 0 refills | Status: DC

## 2016-06-12 ENCOUNTER — Encounter: Payer: Self-pay | Admitting: Cardiology

## 2016-06-12 ENCOUNTER — Ambulatory Visit (INDEPENDENT_AMBULATORY_CARE_PROVIDER_SITE_OTHER): Payer: BLUE CROSS/BLUE SHIELD | Admitting: Cardiology

## 2016-06-12 VITALS — BP 126/74 | HR 90 | Ht 63.0 in | Wt 192.0 lb

## 2016-06-12 DIAGNOSIS — R55 Syncope and collapse: Secondary | ICD-10-CM

## 2016-06-12 DIAGNOSIS — R9431 Abnormal electrocardiogram [ECG] [EKG]: Secondary | ICD-10-CM

## 2016-06-12 DIAGNOSIS — E782 Mixed hyperlipidemia: Secondary | ICD-10-CM

## 2016-06-12 NOTE — Patient Instructions (Addendum)
Medication Instructions:  Your physician recommends that you continue on your current medications as directed. Please refer to the Current Medication list given to you today.   Labwork: Your physician recommends that you return for lab work in: 1 year on the day of or a few days before your office visit with Dr. Delton SeeNelson.  You will need to FAST for this appointment - nothing to eat or drink after midnight the night before except water. Patient states she will get fasting lab work at PCP prior to next visit and will send copies to Dr. Delton SeeNelson.   Testing/Procedures: None Ordered  Follow-Up: Your physician wants you to follow-up in: 1 year with Dr. Delton SeeNelson.  You will receive a reminder letter in the mail two months in advance. If you don't receive a letter, please call our office to schedule the follow-up appointment.   If you need a refill on your cardiac medications before your next appointment, please call your pharmacy.   Thank you for choosing [facility]! Eligha BridegroomMichelle Jerid Catherman, RN [phone]

## 2016-06-12 NOTE — Progress Notes (Signed)
Cardiology Office Note    Date:  06/12/2016   ID:  Sheri CollinsCassandra [name], DOB 01/17/1991, MRN [id number]  PCP:  Sheri HarpPANOSH,Sheri KOTVAN, MD  Cardiologist:   Sheri AlexanderKatarina Niyana Chesbro, MD   Chief complain: Hospitalization follow-up.  History of Present Illness:  Sheri [name] is a 26 y.o. female with history of PE in 2011 (associated with OCPs, off anticoagulation), bipolar disorder/depression, [facility] with 24 hours history of headache with low-grade fever, extreme fatigue, nausea and chills. She reported body aches and several episodes of watery diarrhea. She also had one episode of near syncope that was witnessed by her boyfriend.He presented to the ER with nausea vomiting diarrhea and was diagnosed with norovirus. In the ED she was found to be hypotensive and tachycardic.Blood work showed mild hypokalemia. CT angiogram of the chest done was negative for PE. EKG and initial troponin were unremarkable.  She was referred to cardiology for abnormal EKG and a syncopal episode. While in the hospital she had CT angiogram performed that showed no pulmonary embolism. I have personally reviewed her CT and shows normal coronary origin.  Past Medical History:  Diagnosis Date  . Alcohol addiction (HCC)   . Bipolar depression (HCC)    under psych rx   . Depression   . Drug addiction (HCC)   . Drug addiction in remission [facility](HCC)    heroin  . Pulmonary embolism (HCC) 2011   neg heme evaluation  felt to be from ocps     Past Surgical History:  Procedure Laterality Date  . NO PAST SURGERIES      Current Medications: Outpatient Medications Prior to Visit  Medication Sig Dispense Refill  . amitriptyline (ELAVIL) 25 MG tablet Take 1 tablet (25 mg total) by mouth at bedtime. 30 tablet 0  . pantoprazole (PROTONIX) 40 MG tablet Take 1 tablet (40 mg total) by mouth daily. 30 tablet 1  . PARAGARD INTRAUTERINE COPPER IUD IUD 1 each by Intrauterine route once.    . predniSONE  (DELTASONE) 10 MG tablet Take pills per day,6,6,6,4,4,4,2,2,2,1,1,1 40 tablet 0  . aspirin 81 MG tablet Take 81 mg by mouth daily.    . ondansetron (ZOFRAN) 4 MG tablet Take 1 tablet (4 mg total) by mouth every 6 (six) hours as needed for nausea. (Patient not taking: Reported on 06/12/2016) 15 tablet 0   No facility-administered medications prior to visit.      Allergies:   Bupropion   Social History   Social History  . Marital status: Single    Spouse name: N/A  . Number of children: N/A  . Years of education: N/A   Occupational History  . nanny    Social History Main Topics  . Smoking status: Light Tobacco Smoker  . Smokeless tobacco: Never Used  . Alcohol use No  . Drug use: No  . Sexual activity: Not Currently    Partners: Male   Other Topics Concern  . 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  ROS:   Please see the history of present illness.    ROS All other systems reviewed and are negative.   PHYSICAL EXAM:   VS:  BP 126/74   Pulse 90   Ht 5\' 3"  (1.6 m)   Wt 192 lb (87.1 kg)   BMI 34.01 kg/m    GEN: Well nourished, well developed, in no acute distress  HEENT: normal  Neck: no JVD, carotid bruits, or masses Cardiac: RRR; no murmurs, rubs, or gallops,no edema  Respiratory:  clear to auscultation bilaterally, normal work of breathing GI: soft, nontender, nondistended, + BS MS: no deformity or atrophy  Skin: warm and dry, no rash Neuro:  Alert and Oriented x 3, Strength and sensation are intact Psych: euthymic mood, full affect  Wt Readings from Last 3 Encounters:  06/12/16 192 lb  (87.1 kg)  06/04/16 190 lb (86.2 kg)  06/02/16 193 lb (87.5 kg)      Studies/Labs Reviewed:   EKG:  EKG is not ordered today.   Recent Labs: 04/21/2016: TSH 2.44 05/16/2016: ALT 17; Hemoglobin 13.5; Platelets 310 05/17/2016: BUN 8; Creatinine, Ser 0.71; Potassium 3.7; Sodium 139   Lipid Panel    Component Value Date/Time   CHOL 196 04/21/2016 0856   TRIG 164.0 (H) 04/21/2016 0856   HDL 47.80 04/21/2016 0856   CHOLHDL 4 04/21/2016 0856   VLDL 32.8 04/21/2016 0856   LDLCALC 115 (H) 04/21/2016 0856   LDLDIRECT 150.0 02/13/2015 1449    Additional studies/ records that were reviewed today include:   TTE: 03/17/2017 - Left ventricle: The cavity size was normal. Wall thickness was   increased in a pattern of mild LVH. Systolic function was normal.   The estimated ejection fraction was in the range of 60% to 65%.   Wall motion was normal; there were no regional wall motion   abnormalities. Left ventricular diastolic function parameters   were normal. - Mitral valve: There was mild regurgitation. - Atrial septum: No defect or patent foramen ovale was identified. - Tricuspid valve: There was mild regurgitation. - Pulmonary arteries: PA peak pressure: 36 mm Hg (S).    ASSESSMENT:    1. Mixed hyperlipidemia   2. Syncope and collapse   3. Abnormal ECG      PLAN:  In order of problems listed above:  1. Near syncope - sec to dehydration and hypotension 2. Abnormal ECG - It only showed sinus tachycardia there is now resolved. Isolated negative T waves in lead 3 is not concerning. 3. Mild pulmonary hypertension - most probably a result of prior pulmonary embolism, she has normal RV function and size. 4. Hyperlipidemia -with elevated LDL and triglycerides but only mildly, she is 26 year old, advised to lose weight and was for diet.  Medication Adjustments/Labs and Tests Ordered: Current medicines are reviewed at length with the patient today.  Concerns regarding medicines are  outlined above.  Medication changes, Labs and Tests ordered today are listed in the Patient Instructions below. Patient Instructions  Medication Instructions:  Your physician recommends that you continue on your current medications as directed. Please refer to the Current Medication list given to you today.   Labwork: Your physician recommends that you return for lab work in: 1 year on the day of or a few days before your office visit with Dr. Delton See.  You will need to FAST for this appointment - nothing to eat or drink after midnight the night before except water. Patient states she will get fasting lab work at  PCP prior to next visit and will send copies to Dr. Delton See.   Testing/Procedures: None Ordered  Follow-Up: Your physician wants you to follow-up in: 1 year with Dr. Delton See.  You will receive a reminder letter in the mail two months in advance. If you don't receive a letter, please call our office to schedule the follow-up appointment.   If you need a refill on your cardiac medications before your next appointment, please call your pharmacy.   Thank you for choosing [facility]! Eligha Bridegroom, RN [phone]       Signed, Sheri Alexander, MD  06/12/2016 9:28 AM    [facility] [address] [place], [place], [place]  [address] Phone: [phone]; Fax: [phone]

## 2016-07-09 ENCOUNTER — Ambulatory Visit (INDEPENDENT_AMBULATORY_CARE_PROVIDER_SITE_OTHER): Payer: BLUE CROSS/BLUE SHIELD | Admitting: Internal Medicine

## 2016-07-09 ENCOUNTER — Encounter: Payer: Self-pay | Admitting: Internal Medicine

## 2016-07-09 VITALS — BP 104/70 | HR 78 | Ht 63.0 in | Wt 192.0 lb

## 2016-07-09 DIAGNOSIS — R10812 Left upper quadrant abdominal tenderness: Secondary | ICD-10-CM | POA: Diagnosis not present

## 2016-07-09 DIAGNOSIS — K219 Gastro-esophageal reflux disease without esophagitis: Secondary | ICD-10-CM | POA: Diagnosis not present

## 2016-07-09 DIAGNOSIS — R1012 Left upper quadrant pain: Secondary | ICD-10-CM

## 2016-07-09 NOTE — Progress Notes (Signed)
Sheri [name] 26 y.o. 06/15/1990 [id number] Referred by: Madelin HeadingsPanosh, Wanda K, MD  Assessment & Plan:   Encounter Diagnoses  Name Primary?  . LUQ pain Yes  . Left upper quadrant abdominal tenderness without rebound tenderness   . Gastroesophageal reflux disease, esophagitis presence not specified    Cause of her left upper quadrant pain is not clear. It seems like her GERD symptoms are under control but she has a persistent left upper quadrant pain that does not seem to be in the wall of the abdomen or lower chest. Upper endoscopy will be undertaken.The risks and benefits as well as alternatives of endoscopic procedure(s) have been discussed and reviewed. All questions answered. The patient agrees to proceed. If that is unrevealing, will consider cross-sectional imaging with either ultrasound or CT scanning of the abdomen.  I appreciate the opportunity to care for this patient. CC: Lorretta HarpPANOSH,WANDA KOTVAN, MD   Subjective:   Chief Complaint: Left upper quadrant pain  HPI The patient is a very nice single 26 year old white woman with a several month history of left upper quadrant pain. She said it started last fall, it's a sharp pain, it was associated with increasing heartburn working a lot these were new symptoms. She tried over-the-counter Zantac. She was taking a lot of Tums noted that seemed to help. She went to an urgent care was diagnosed with gastritis told to take Nexium which she did but the pain worsened. Subsequently at some point she had nor a virus and was briefly hospitalized, because of an associated syncope. Cardiology saw her and thought that that was related to dehydration and hypotension and was actually near syncope. She mostly had some diarrhea not a lot of nausea and vomiting with that. She is now on pantoprazole and says that is working very well controlling her heartburn but she still has this left upper quadrant pain. She was on low-dose aspirin because of a  history of pulmonary embolism. He stopped that thinks things a little better. At one point she was taking some ibuprofen for about a month but was told to stop that when she went to urgent care. The pain is not positional. However she cannot really stand to be palpated there any type of pressure things like that when she lies on it makes it worse. It is not associated with twisting or lifting or anything. Bowel movements are unaffected. There is some nausea. It's better in the morning and worsens as the day progresses but there is no discrete food trigger anything like that that I can tell. Caffeine is limited to 2 drinks a day. The patient is a recovering addict, with substance abuse issues but is employed part-time as a Social workernanny. No alcohol or substance at this time. She's been in remission for 3 years.  Note she had a chest CT 2 in January of this year, one was because of 2 months of chest pain and history of PE and the other was when she presented with her neurovirus and presyncope or syncope. Both of those were negative for pulmonary embolism and the upper abdomen was negative and I have reviewed the images.  Allergies  Allergen Reactions  . Bupropion Other (See Comments)   Current Meds  Medication Sig  . pantoprazole (PROTONIX) 40 MG tablet Take 1 tablet (40 mg total) by mouth daily.  Marland Kitchen. PARAGARD INTRAUTERINE COPPER IUD IUD 1 each by Intrauterine route once.   Past Medical History:  Diagnosis Date  . Alcohol addiction (HCC)   .  Bipolar depression (HCC)    under psych rx   . Depression   . Drug addiction (HCC)   . Drug addiction in remission [facility])    heroin  . Enteritis due to Norovirus 05/18/2016  . PCOS (polycystic ovarian syndrome) 02/13/2015  . Pulmonary embolism (HCC) 2011   neg heme evaluation  felt to be from ocps   . Syncope and collapse 05/16/2016   Past Surgical History:  Procedure Laterality Date  . NO PAST SURGERIES     Social History   Social History  . Marital status:  Single    Spouse name: N/A  . Number of children: N/A  . Years of education: N/A   Occupational History  . nanny    Social History Main Topics  . Smoking status: Light Tobacco Smoker  . Smokeless tobacco: Never Used  . Alcohol use No  . Drug use: No  . Sexual activity: Not Asked   Other Topics Concern  . 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  Review of Systems As per history of present illness. She has some headaches, she says her headaches are better when she belches. There is some insomnia and back pain. All other review of systems negative.  Objective:   Physical Exam @BP  104/70   Pulse 78   Ht 5\' 3"  (1.6 m)   Wt 87.1 kg (192 lb)   LMP 07/07/2016   BMI 34.01 kg/m @  General:  Well-developed, well-nourished and in no acute distress  Eyes:  anicteric. ENT:   Mouth and posterior pharynx free of lesions.  Neck:   supple w/o thyromegaly or mass.  Lungs: Clear to auscultation bilaterally. Heart:  S1S2, no rubs, murmurs, gallops. Abdomen:  soft, mildly tender upper quadrant, not the ribs, negative Carnett's sign, no hepatosplenomegaly, hernia, or mass and BS+.  Lymph:  no cervical or supraclavicular adenopathy. Extremities:   no edema, cyanosis or clubbing Neuro:  A&O x 3.  Psych:  appropriate mood and  Affect.   Data Reviewed: See history of present illness. I reviewed hospitalization notes primary care and cardiology notes. H. pylori antigen was recently negative Electrolytes kidney function CBC all normal in January

## 2016-07-09 NOTE — Patient Instructions (Signed)

## 2016-07-10 ENCOUNTER — Encounter: Payer: Self-pay | Admitting: Internal Medicine

## 2016-07-10 ENCOUNTER — Ambulatory Visit (AMBULATORY_SURGERY_CENTER): Payer: BLUE CROSS/BLUE SHIELD | Admitting: Internal Medicine

## 2016-07-10 VITALS — BP 118/66 | HR 79 | Temp 98.4°F | Resp 24 | Ht 63.0 in | Wt 192.0 lb

## 2016-07-10 DIAGNOSIS — R1012 Left upper quadrant pain: Secondary | ICD-10-CM

## 2016-07-10 MED ORDER — SODIUM CHLORIDE 0.9 % IV SOLN: 500.0000 mL | INTRAVENOUS | Status: DC

## 2016-07-10 NOTE — Progress Notes (Signed)
Dental advisory given to patient 

## 2016-07-10 NOTE — Op Note (Signed)
[facility] Patient Name: Sheri [name] Procedure Date: 07/10/2016 3:29 PM MRN: [id number] Endoscopist: Iva Boop , MD Age: 26 Referring MD:  Date of Birth: 05-Jun-1990 Gender: Female Account #: [id number] Procedure:                Upper GI endoscopy Indications:              Abdominal pain in the left upper quadrant Medicines:                Propofol per Anesthesia, Monitored Anesthesia Care Procedure:                Pre-Anesthesia Assessment:                           - Prior to the procedure, a History and Physical                            was performed, and patient medications and                            allergies were reviewed. The patient's tolerance of                            previous anesthesia was also reviewed. The risks                            and benefits of the procedure and the sedation                            options and risks were discussed with the patient.                            All questions were answered, and informed consent                            was obtained. Prior Anticoagulants: The patient has                            taken no previous anticoagulant or antiplatelet                            agents. ASA Grade Assessment: II - A patient with                            mild systemic disease. After reviewing the risks                            and benefits, the patient was deemed in                            satisfactory condition to undergo the procedure.                           After obtaining informed consent, the endoscope was  passed under direct vision. Throughout the                            procedure, the patient's blood pressure, pulse, and                            oxygen saturations were monitored continuously. The                            Model GIF-[id number]) scope was introduced                            through the mouth, and advanced to the second part                of duodenum. The upper GI endoscopy was                            accomplished without difficulty. The patient                            tolerated the procedure well. Scope In: Scope Out: Findings:                 The esophagus was normal.                           The stomach was normal.                           The examined duodenum was normal. Complications:            No immediate complications. Estimated Blood Loss:     Estimated blood loss: none. Impression:               - Normal esophagus.                           - Normal stomach.                           - Normal examined duodenum.                           - No specimens collected. Recommendation:           - Patient has a contact number available for                            emergencies. The signs and symptoms of potential                            delayed complications were discussed with the                            patient. Return to normal activities tomorrow.                            Written discharge instructions were provided to the  patient.                           - Resume previous diet.                           - Continue present medications.                           - Schedule abdominal ultrasound to evaluate LUQ                            pain and tenderness Iva Boop, MD 07/10/2016 3:50:25 PM This report has been signed electronically.

## 2016-07-10 NOTE — Patient Instructions (Addendum)
   This all looks normal. I think next step is to have you get an ultrasound of the abdomen.  We will order that. If that is ok would probably observe.  I appreciate the opportunity to care for you. Iva Booparl E. Shawntina Diffee, MD, FACG  YOU HAD AN ENDOSCOPIC PROCEDURE TODAY: Refer to the procedure report and other information in the discharge instructions given to you for any specific questions about what was found during the examination. If this information does not answer your questions, please call [place] office at [phone] to clarify.   YOU SHOULD EXPECT: Some feelings of bloating in the abdomen. Passage of more gas than usual. Walking can help get rid of the air that was put into your GI tract during the procedure and reduce the bloating. If you had a lower endoscopy (such as a colonoscopy or flexible sigmoidoscopy) you may notice spotting of blood in your stool or on the toilet paper. Some abdominal soreness may be present for a day or two, also.  DIET: Your first meal following the procedure should be a light meal and then it is ok to progress to your normal diet. A half-sandwich or bowl of soup is an example of a good first meal. Heavy or fried foods are harder to digest and may make you feel nauseous or bloated. Drink plenty of fluids but you should avoid alcoholic beverages for 24 hours. If you had a esophageal dilation, please see attached instructions for diet.    ACTIVITY: Your care partner should take you home directly after the procedure. You should plan to take it easy, moving slowly for the rest of the day. You can resume normal activity the day after the procedure however YOU SHOULD NOT DRIVE, use power tools, machinery or perform tasks that involve climbing or major physical exertion for 24 hours (because of the sedation medicines used during the test).   SYMPTOMS TO REPORT IMMEDIATELY: A gastroenterologist can be reached at any hour. Please call [phone]  for any of the  following symptoms:   Following upper endoscopy (EGD, EUS, ERCP, esophageal dilation) Vomiting of blood or coffee ground material  New, significant abdominal pain  New, significant chest pain or pain under the shoulder blades  Painful or persistently difficult swallowing  New shortness of breath  Black, tarry-looking or red, bloody stools  FOLLOW UP:  If any biopsies were taken you will be contacted by phone or by letter within the next 1-3 weeks. Call [phone]  if you have not heard about the biopsies in 3 weeks.  Please also call with any specific questions about appointments or follow up tests.

## 2016-07-13 ENCOUNTER — Telehealth: Payer: Self-pay

## 2016-07-13 ENCOUNTER — Other Ambulatory Visit: Payer: Self-pay

## 2016-07-13 DIAGNOSIS — R1012 Left upper quadrant pain: Secondary | ICD-10-CM

## 2016-07-13 NOTE — Telephone Encounter (Signed)
  Follow up Call-  Call back number 07/10/2016  Post procedure Call Back phone  # [phone]  Permission to leave phone message Yes  Some recent data might be hidden     Patient questions:  Do you have a fever, pain , or abdominal swelling? No. Pain Score  0 *  Have you tolerated food without any problems? Yes.    Have you been able to return to your normal activities? Yes.    Do you have any questions about your discharge instructions: Diet   No. Medications  No. Follow up visit  No.  Do you have questions or concerns about your Care? No.  Actions: * If pain score is 4 or above: No action needed, pain <4.

## 2016-07-13 NOTE — Progress Notes (Signed)
Patient notified of the US scheduled for 07/20/16 8:00

## 2016-07-20 ENCOUNTER — Other Ambulatory Visit: Payer: Self-pay | Admitting: Obstetrics and Gynecology

## 2016-07-20 ENCOUNTER — Ambulatory Visit (HOSPITAL_COMMUNITY)
Admission: RE | Admit: 2016-07-20 | Discharge: 2016-07-20 | Disposition: A | Discharge status: Home / Self Care | Payer: BLUE CROSS/BLUE SHIELD | Source: Ambulatory Visit | Attending: Internal Medicine | Admitting: Internal Medicine

## 2016-07-20 DIAGNOSIS — R1012 Left upper quadrant pain: Secondary | ICD-10-CM | POA: Diagnosis present

## 2016-07-20 DIAGNOSIS — R161 Splenomegaly, not elsewhere classified: Secondary | ICD-10-CM | POA: Diagnosis not present

## 2016-07-20 NOTE — Telephone Encounter (Signed)
I haven't been prescribing this medicine. Uncertain  Request for refill   Please advise send to the original prescriber perhaps.for a refill?

## 2016-07-21 NOTE — Progress Notes (Signed)
My Chart note to pat Borderline splenomegaly only

## 2016-07-21 NOTE — Telephone Encounter (Signed)
Was given to patient in sports medicine clinic on 06/02/16. She had a brachial plexus neuropathy and was given amitriptyline 25 mg at night for 30 days to help with symptoms. She should follow-up with sports medicine if she needs a refill or if her symptoms have resolved can discontinue further use. Thanks!

## 2016-07-22 ENCOUNTER — Encounter: Payer: Self-pay | Admitting: Internal Medicine

## 2016-07-29 ENCOUNTER — Other Ambulatory Visit: Payer: Self-pay | Admitting: Internal Medicine

## 2016-10-13 ENCOUNTER — Ambulatory Visit (INDEPENDENT_AMBULATORY_CARE_PROVIDER_SITE_OTHER): Payer: BLUE CROSS/BLUE SHIELD | Admitting: Sports Medicine

## 2016-10-13 ENCOUNTER — Telehealth: Payer: Self-pay | Admitting: Family Medicine

## 2016-10-13 ENCOUNTER — Encounter: Payer: Self-pay | Admitting: Sports Medicine

## 2016-10-13 ENCOUNTER — Ambulatory Visit
Admission: RE | Admit: 2016-10-13 | Discharge: 2016-10-13 | Disposition: A | Discharge status: Home / Self Care | Payer: BLUE CROSS/BLUE SHIELD | Source: Ambulatory Visit | Attending: Sports Medicine | Admitting: Sports Medicine

## 2016-10-13 VITALS — BP 126/80 | Ht 63.0 in | Wt 193.0 lb

## 2016-10-13 DIAGNOSIS — M4724 Other spondylosis with radiculopathy, thoracic region: Secondary | ICD-10-CM

## 2016-10-13 DIAGNOSIS — M549 Dorsalgia, unspecified: Secondary | ICD-10-CM

## 2016-10-13 HISTORY — DX: Other spondylosis with radiculopathy, thoracic region: M47.24

## 2016-10-13 NOTE — Progress Notes (Signed)
   Subjective:    Patient ID: Sheri [name], female    DOB: 10/13/1990, 26 y.o.   MRN: [id number]  HPI Ms. Sheri [name] is a 26 year old female who presents with Epigastric and left upper abdominal pain and upper back pain.  These symptoms started about 1 year ago and have improved some but still bothers her. The epigastric/abdominal pain is constant and radiates to the left upper abdomen. She is unable to lay to her left side due to the pain and the area is tender to touch. This is a constant pain. Pain always stops a the midline  She also has been having back pain at the same level (around T7) with radiation to the left trunk like a band. Patient used to be a Horticulturist, commercialdancer in high school (no specific injury in the past).   She had a significant GI work up done recently with EGD and abdominal ultrasound which was unremarkable. She was put on heart burn medication which minimally helped.   Past Hx;  Substance abuse and is in recovery doing well  Review of Systems No numbness of the area No rash of the area     Objective:   Physical Exam Gen: NAD Mild scoliosis with thoracic curvature  Good flexibility of the thoracic spine.  Mild soreness to palpation of the spine at the T7 region and to the left at the same level towards the left upper abdomen and epigastric region (at the same level) Back extension extreme brings out radiating pain Lying exam the RT facet joints are fixed with poor motion and elevated No rashes noted in the area.    Assessment & Plan:  Thoracic Radicular Pain: symptoms are most consistent with thoracic radicular pain. Provided stretching exercises. Heat over area as needed.  Will obtain x-ray thoracic spine, and if unremarkable will refer for thoracic spine manipulation   XR - 3 level of thoracic DDD and spurring Older CT of chest to r/o PE shows some marked ant. Spurring and 3 level DDD thoracic  Sheri HolterKanishka G Sheri Cullers, MD  PGY 2 Family Medicine  I observed and examined  the patient with the resident and agree with assessment and plan.  Note reviewed and modified by me. Sheri BaasKarl Fields, MD

## 2016-10-13 NOTE — Assessment & Plan Note (Signed)
With these changes she likely has facet joint or other impingement that leads to neural irritation  I think she should try some manipulation and will refer to Dr Berline Choughigby to try this approach

## 2016-10-13 NOTE — Patient Instructions (Addendum)
Your symptoms are consistent with Thoracic Radicular Pain  We recommend the following exercises:   T stretch to the right side Rotation of the back into the right  "starting the lawn mower" to the left and the right  Heat as needed  We will get an x-ray of your spine If the x-ray is fine, we will refer you to a specialist for manipulation of the spine  Dr Sherilyn BankerMicheal Rigby [address] [place] [phone] Monday July 2nd at 1:30pm

## 2016-10-13 NOTE — Telephone Encounter (Signed)
Pharmacy requesting 90 day supply

## 2016-10-14 ENCOUNTER — Other Ambulatory Visit: Payer: Self-pay | Admitting: Emergency Medicine

## 2016-10-14 MED ORDER — PANTOPRAZOLE SODIUM 40 MG PO TBEC: 40.0000 mg | DELAYED_RELEASE_TABLET | Freq: Every day | ORAL | 0 refills | Status: DC

## 2016-10-14 NOTE — Telephone Encounter (Signed)
Refill has been sent to the pharmacy.  

## 2016-10-19 ENCOUNTER — Encounter: Payer: Self-pay | Admitting: Sports Medicine

## 2016-10-19 ENCOUNTER — Ambulatory Visit (INDEPENDENT_AMBULATORY_CARE_PROVIDER_SITE_OTHER): Payer: BLUE CROSS/BLUE SHIELD | Admitting: Sports Medicine

## 2016-10-19 VITALS — BP 110/80 | HR 78 | Ht 63.0 in | Wt 184.2 lb

## 2016-10-19 DIAGNOSIS — M9902 Segmental and somatic dysfunction of thoracic region: Secondary | ICD-10-CM | POA: Diagnosis not present

## 2016-10-19 DIAGNOSIS — M9904 Segmental and somatic dysfunction of sacral region: Secondary | ICD-10-CM | POA: Diagnosis not present

## 2016-10-19 DIAGNOSIS — M4724 Other spondylosis with radiculopathy, thoracic region: Secondary | ICD-10-CM | POA: Diagnosis not present

## 2016-10-19 DIAGNOSIS — M9901 Segmental and somatic dysfunction of cervical region: Secondary | ICD-10-CM | POA: Diagnosis not present

## 2016-10-19 DIAGNOSIS — M9905 Segmental and somatic dysfunction of pelvic region: Secondary | ICD-10-CM | POA: Diagnosis not present

## 2016-10-19 DIAGNOSIS — M9903 Segmental and somatic dysfunction of lumbar region: Secondary | ICD-10-CM | POA: Diagnosis not present

## 2016-10-19 NOTE — Progress Notes (Signed)
OFFICE VISIT NOTE Sheri [name]. Sheri [name] [facility] [phone]  Sheri [name] - 26 y.o. female MRN [id number]  Date of birth: 1991/03/17  Visit Date: 10/19/2016  PCP: Sheri Headings, MD   Referred by: Sheri [name]  Sheri [name], Sheri [name] acting as scribe for Dr. Berline [name].  SUBJECTIVE:   Chief Complaint  Patient presents with  . back pain, thoracic region   HPI: As below and per problem based documentation when appropriate.  Pt presents today with complaint of mid-back pain. She had xray done 10/13/16 which showed the following: IMPRESSION: No acute or subacute osseous abnormality. Mild lower thoracic degenerative disc disease and spondylosis.  Pain has been fairly constant x 1 year.  No known injury or trauma. She was a Horticulturist, commercial, ballet, in high school.   The pain is described as sharp and is rated as 8/10 at its worst but 1/10 when at its best.  Pain seems to be worse at the end of the day. She has pain when she swims or jogs. Pain seems to intensify when laying down. She doesn't have much pain when trying to lift objects.  Improves with icing the back and taking Ibuprofen.   Other associated symptoms include: Pain occasionally radiates into the lower back and neck. Pain started in the upper quadrant and she saw GI for this but it seems now that they pain is originating in her back.   Pt denies fever, chills, night sweats.     Review of Systems  Constitutional: Negative for chills and fever.  Respiratory: Negative for shortness of breath and wheezing.   Cardiovascular: Positive for chest pain and palpitations.       Followed by Sheri [name]  Musculoskeletal: Positive for back pain. Negative for falls.  Neurological: Positive for tingling (fingers). Negative for dizziness and headaches.  Endo/Heme/Allergies: Does not bruise/bleed easily.    Otherwise per HPI.  HISTORY & PERTINENT PRIOR DATA:  No specialty  comments available. She reports that she quit smoking about 10 months ago. She has never used smokeless tobacco.   Recent Labs  04/27/16 1207  HGBA1C 5.2   Medications & Allergies reviewed per EMR Patient Active Problem List   Diagnosis Date Noted  . Thoracic radiculopathy due to degenerative joint disease of spine 10/13/2016  . Orthostatic hypotension 05/18/2016  . Hypokalemia 05/17/2016  . Syncope and collapse 05/16/2016  . PCOS (polycystic ovarian syndrome) 02/13/2015  . IUD (intrauterine device) in place 02/13/2015  . Hx of pulmonary embolus 02/13/2015  . Bipolar depression (HCC)    Past Medical History:  Diagnosis Date  . Alcohol addiction (HCC)   . Anxiety   . Bipolar depression (HCC)    under psych rx   . Depression   . Drug addiction (HCC)   . Drug addiction in remission [facility])    heroin  . Enteritis due to Norovirus 05/18/2016  . PCOS (polycystic ovarian syndrome) 02/13/2015  . Pulmonary embolism (HCC) 2011   neg heme evaluation  felt to be from ocps   . Syncope and collapse 05/16/2016   Family History  Problem Relation Age of Onset  . Colon cancer Maternal Grandfather   . Hypertension Father   . Irritable bowel syndrome Father    Past Surgical History:  Procedure Laterality Date  . NO PAST SURGERIES     Social History   Occupational History  . nanny    Social History Main Topics  . Smoking status:  Former Smoker    Quit date: 12/20/2015  . Smokeless tobacco: Never Used  . Alcohol use No  . Drug use: No  . Sexual activity: Not on file    OBJECTIVE:  VS:  HT:5\' 3"  (160 cm)   WT:184 lb 3.2 oz (83.6 kg)  BMI:32.7    BP:110/80  HR:78bpm  TEMP: ( )  RESP:98 % EXAM: Findings:  Adult female, no acute distress.  Alert and appropriate.  Bilateral upper extremities overall well aligned.  Sensation intact to light touch.  Negative straight leg raise bilaterally.  Good cervical range of motion although slightly limited with rightward side bending and  rotation compared to the left.  No pain with Spurling's compression test and Lhermitte's compression test.  Upper and lower extremity strength is 5+/5 in all myotomes.   Upper thoracic rotation limited to the Right, lower thoracic limited to the left; both of approximately 15 compared to the left lateral side  OSTEOPATHIC/STRUCTURAL EXAM:   C3 FRS right C4 through C6 FRS left T2 FRS right Rib 4 posterior on the right T8 through T12 neutral, rotated left, side bent Right     Dg Thoracic Spine W/swimmers  Result Date: 10/13/2016 CLINICAL DATA:  Chronic mid back pain radiating to the left side of the back. No known injuries. EXAM: THORACIC SPINE - 3 VIEWS COMPARISON:  Bone window images from CTA chest 05/16/2016, 04/27/2016. FINDINGS: Twelve rib-bearing thoracic vertebrae with anatomic posterior alignment. No fractures. Slight thoracolumbar dextroscoliosis. Disc space narrowing and endplate hypertrophic changes at T7-8, T8-9, T9- 10 and T10-11, worst at T10-11. No change since the prior CT bone window images. IMPRESSION: No acute or subacute osseous abnormality. Mild lower thoracic degenerative disc disease and spondylosis. Electronically Signed   By: Sheri Saashomas  [name] M.D.   On: 10/13/2016 12:41   ASSESSMENT & PLAN:   Problem List Items Addressed This Visit    Thoracic radiculopathy due to degenerative joint disease of spine    Osteopathic medication performed as below.  She had good response to this.  She has had ongoing structural imbalances for quite some time likely secondary to dance.  X-ray findings are consistent with degenerative disc and degenerative joint disease of the thoracic spine she also has a functionally tight left iliopsoas.   foundations training exercises recommended.  Also discussed the weight as well as breast size are likely contributing to a large component of her anterior chain dominance and that if persistent severe symptoms could consider breast reduction  surgery.  PROCEDURE NOTE : OSTEOPATHIC MANIPULATION The decision today to treat with Osteopathic Manipulative Therapy (OMT) was based on physical exam findings. Verbal consent was obtained after after explanation of risks, benefits and potential side effects, including acute pain flare, post manipulation soreness and need for repeat treatments.  If Cervical manipulation was performed additional time was spent discussing the associated minimal risk of  injury to neurovascular structures.  After consent was obtained manipulation was performed as below:            Regions treated:  Per billing codes          Techniques used:  Direct, Mus[employer], MFR, HVLA and ART The patient tolerated the treatment well and reported Improved symptoms following treatment today. Patient was given medications, exercises, stretches and lifestyle modifications per AVS and verbally.          Other Visit Diagnoses    Osteoarthritis of spine with radiculopathy, thoracic region    -  Primary   Somatic  dysfunction of cervical region       Somatic dysfunction of thoracic region       Somatic dysfunction of lumbar region       Somatic dysfunction of sacral region       Somatic dysfunction of pelvis region          Follow-up: Return in about 3 weeks (around 11/09/2016).    Sheri [name]/[facility] served as Neurosurgeon during this visit. History, Physical, and Plan performed by medical provider. Documentation and orders reviewed and attested to.      Gaspar Bidding, DO    [facility] Physician

## 2016-10-20 NOTE — Patient Instructions (Addendum)
Also check out the YouTube Video from Dr. Eric Goodman.  I would like to see you try performing this 5-6 days per week.   "Powerful Posture and Pain Relief: 12 minutes of Foundation Training" - https://youtu.be/[email or web address]   Do not try to attempt this entire video when first beginning.    Try breaking of each exercise that he goes into shorter segments.  Otherwise if they perform an exercise for 45 seconds, start with 15 seconds and rest and then resume with a begin the new activity.  Work your way up to doing this 12 minute video and I expect to see significant improvements in your pain.  

## 2016-10-20 NOTE — Assessment & Plan Note (Addendum)
Osteopathic medication performed as below.  She had good response to this.  She has had ongoing structural imbalances for quite some time likely secondary to dance.  X-ray findings are consistent with degenerative disc and degenerative joint disease of the thoracic spine she also has a functionally tight left iliopsoas.   foundations training exercises recommended.  Also discussed the weight as well as breast size are likely contributing to a large component of her anterior chain dominance and that if persistent severe symptoms could consider breast reduction surgery.  PROCEDURE NOTE : OSTEOPATHIC MANIPULATION The decision today to treat with Osteopathic Manipulative Therapy (OMT) was based on physical exam findings. Verbal consent was obtained after after explanation of risks, benefits and potential side effects, including acute pain flare, post manipulation soreness and need for repeat treatments.  If Cervical manipulation was performed additional time was spent discussing the associated minimal risk of  injury to neurovascular structures.  After consent was obtained manipulation was performed as below:            Regions treated:  Per billing codes          Techniques used:  Direct, Muscle Energy, MFR, HVLA and ART The patient tolerated the treatment well and reported Improved symptoms following treatment today. Patient was given medications, exercises, stretches and lifestyle modifications per AVS and verbally.

## 2016-11-09 ENCOUNTER — Ambulatory Visit (INDEPENDENT_AMBULATORY_CARE_PROVIDER_SITE_OTHER): Payer: BLUE CROSS/BLUE SHIELD | Admitting: Sports Medicine

## 2016-11-09 ENCOUNTER — Encounter: Payer: Self-pay | Admitting: Sports Medicine

## 2016-11-09 VITALS — BP 118/76 | HR 92 | Ht 63.0 in | Wt 182.0 lb

## 2016-11-09 DIAGNOSIS — M9905 Segmental and somatic dysfunction of pelvic region: Secondary | ICD-10-CM | POA: Diagnosis not present

## 2016-11-09 DIAGNOSIS — R0789 Other chest pain: Secondary | ICD-10-CM | POA: Diagnosis not present

## 2016-11-09 DIAGNOSIS — Z86711 Personal history of pulmonary embolism: Secondary | ICD-10-CM

## 2016-11-09 DIAGNOSIS — M4724 Other spondylosis with radiculopathy, thoracic region: Secondary | ICD-10-CM

## 2016-11-09 DIAGNOSIS — M9908 Segmental and somatic dysfunction of rib cage: Secondary | ICD-10-CM | POA: Diagnosis not present

## 2016-11-09 DIAGNOSIS — M9901 Segmental and somatic dysfunction of cervical region: Secondary | ICD-10-CM | POA: Diagnosis not present

## 2016-11-09 DIAGNOSIS — M9902 Segmental and somatic dysfunction of thoracic region: Secondary | ICD-10-CM | POA: Diagnosis not present

## 2016-11-09 DIAGNOSIS — M9903 Segmental and somatic dysfunction of lumbar region: Secondary | ICD-10-CM | POA: Diagnosis not present

## 2016-11-09 MED ORDER — FAMOTIDINE 20 MG PO TABS: 20.0000 mg | ORAL_TABLET | Freq: Two times a day (BID) | ORAL | 1 refills | Status: DC

## 2016-11-09 NOTE — Progress Notes (Signed)
OFFICE VISIT NOTE Sheri [name]. Sheri Shinerigby, DO  [place] [facility] [place] [phone]  Sheri [name] - 26 y.o. female MRN [id number]  Date of birth: 05/12/1990  Visit Date: 11/09/2016  PCP: Madelin HeadingsPanosh, Wanda K, MD   Referred by: Madelin HeadingsPanosh, Wanda K, MD   SUBJECTIVE:   Chief Complaint  Patient presents with  . Follow-up   HPI: As below and per problem based documentation when appropriate.  Patient presents today for a week follow up on osteoarthritis in t-spine Patient is experiencing  some pain 5/10 at the moment and typically experiences 9/10 on pain scale.    Foundations training exercises are help full and have been working.       Review of Systems  Constitutional: Negative for chills, fever and malaise/fatigue.  Respiratory: Negative for shortness of breath and wheezing.   Cardiovascular: Positive for chest pain and palpitations.  Musculoskeletal: Negative for falls.  Neurological: Negative for dizziness, tingling, weakness and headaches.  Endo/Heme/Allergies: Does not bruise/bleed easily.    Otherwise per HPI.  HISTORY & PERTINENT PRIOR DATA:  No specialty comments available. She reports that she quit smoking about a year ago. She has never used smokeless tobacco.   Recent Labs  04/27/16 1207  HGBA1C 5.2   Medications & Allergies reviewed per EMR Patient Active Problem List   Diagnosis Date Noted  . Thoracic radiculopathy due to degenerative joint disease of spine 10/13/2016  . Orthostatic hypotension 05/18/2016  . Hypokalemia 05/17/2016  . Syncope and collapse 05/16/2016  . PCOS (polycystic ovarian syndrome) 02/13/2015  . IUD (intrauterine device) in place 02/13/2015  . Hx of pulmonary embolus 02/13/2015  . Bipolar depression (HCC)    Past Medical History:  Diagnosis Date  . Alcohol addiction (HCC)   . Anxiety   . Bipolar depression (HCC)    under psych rx   . Depression   . Drug addiction (HCC)   . Drug addiction in  remission [facility](HCC)    heroin  . Enteritis due to Norovirus 05/18/2016  . PCOS (polycystic ovarian syndrome) 02/13/2015  . Pulmonary embolism (HCC) 2011   neg heme evaluation  felt to be from ocps   . Syncope and collapse 05/16/2016   Family History  Problem Relation Age of Onset  . Colon cancer Maternal Grandfather   . Hypertension Father   . Irritable bowel syndrome Father    Past Surgical History:  Procedure Laterality Date  . NO PAST SURGERIES     Social History   Occupational History  . nanny    Social History Main Topics  . Smoking status: Former Smoker    Quit date: 12/20/2015  . Smokeless tobacco: Never Used  . Alcohol use No  . Drug use: No  . Sexual activity: Not on file    OBJECTIVE:  VS:  HT:5\' 3"  (160 cm)   WT:182 lb (82.6 kg)  BMI:32.3    BP:118/76  HR:92bpm  TEMP: ( )  RESP:98 % EXAM: Findings:  Adult female, no acute distress.  Alert and appropriate.   Bilateral upper extremities overall well aligned.   Sensation intact to light touch.   Negative straight leg raise bilaterally.   Good cervical range of motion with minimal rightward rotation side bending limitation. No pain with Spurling's compression test and Lhermitte's compression test.   Upper and lower extremity strength is 5+/5 in all myotomes.   Upper thoracic rotation limited to the Right, lower thoracic limited to the left; both of approximately 15  compared to the left lateral side  OSTEOPATHIC/STRUCTURAL EXAM:   OA rotated LEFT C4 through C6 FRS left T2 FRS right Rib 6 posterior on the right T8 through T12 neutral, rotated left, side bent Right L3, FRS right Right anterior innominate Left on left sacral torsion     Dg Thoracic Spine W/swimmers  Result Date: 10/13/2016 CLINICAL DATA:  Chronic mid back pain radiating to the left side of the back. No known injuries. EXAM: THORACIC SPINE - 3 VIEWS COMPARISON:  Bone window images from CTA chest 05/16/2016, 04/27/2016. FINDINGS: Twelve  rib-bearing thoracic vertebrae with anatomic posterior alignment. No fractures. Slight thoracolumbar dextroscoliosis. Disc space narrowing and endplate hypertrophic changes at T7-8, T8-9, T9- 10 and T10-11, worst at T10-11. No change since the prior CT bone window images. IMPRESSION: No acute or subacute osseous abnormality. Mild lower thoracic degenerative disc disease and spondylosis. Electronically Signed   By: Hulan Saas M.[name].   On: 10/13/2016 12:41   ASSESSMENT & PLAN:     ICD-10-CM   1. Thoracic radiculopathy due to degenerative joint disease of spine M47.24   2. Somatic dysfunction of cervical region M99.01   3. Somatic dysfunction of lumbar region M99.03   4. Somatic dysfunction of pelvis region M99.05   5. Somatic dysfunction of thoracic region M99.02   6. Somatic dysfunction of rib region M99.08   7. Hx of pulmonary embolus Z86.711   8. Discomfort in chest R07.89    Patient does report some GERD-like symptoms and treatment for this per AVS.  Red flags reviewed.  ================================================================= Thoracic radiculopathy due to degenerative joint disease of spine She responded well to osteopathic question at last visit but did have a slight amount of soreness following this. This was repeated again today with overall improved comfort at the end of the session.  Continue with home therapeutic exercises previously outlined in per AVS.  PROCEDURE NOTE : OSTEOPATHIC MANIPULATION The decision today to treat with Osteopathic Manipulative Therapy (OMT) was based on physical exam findings. Verbal consent was obtained after after explanation of risks, benefits and potential side effects, including acute pain flare, post manipulation soreness and need for repeat treatments.  If Cervical manipulation was performed additional time was spent discussing the associated minimal risk of  injury to neurovascular structures.  After consent was obtained manipulation was  performed as below:            Regions treated:  Per billing codes          Techniques used:  Muscle Energy, MFR, HVLA and ART The patient tolerated the treatment well and reported Improved symptoms following treatment today. Patient was given medications, exercises, stretches and lifestyle modifications per AVS and verbally.     Patient Instructions  Try using the reflux medicine twice daily for the next 2 weeks then cut back.  If the palpitations that you are having returned we will need to discuss further treatment of your acid reflux.  Also check out the YouTube Video from Dr. Myles Lipps.  I would like to see you try performing this 5-6 days per week.    A good intro video is: "Independence from Pain 7-minute Video" - [email or web address]   His more advanced video is: "Powerful Posture and Pain Relief: 12 minutes of Foundation Training" - [email or web address]://youtu.[email or web address]   Do not try to attempt this entire video when first beginning.    Try breaking of each exercise that he goes into shorter segments.  Otherwise if they perform an exercise for 45  seconds, start with 15 seconds and rest and then resume with a begin the new activity.  Work your way up to doing this 12 minute video and I expect to see significant improvements in your pain.  ================================================================= Follow-up: Return for consideration of repeat Osteopathic Manipulation.   CMA/ATC served as Neurosurgeon during this visit. History, Physical, and Plan performed by medical provider. Documentation and orders reviewed and attested to.      Gaspar Bidding, DO    [facility]

## 2016-11-09 NOTE — Patient Instructions (Signed)
Try using the reflux medicine twice daily for the next 2 weeks then cut back.  If the palpitations that you are having returned we will need to discuss further treatment of your acid reflux.  Also check out the YouTube Video from Dr. Myles LippsEric Goodman.  I would like to see you try performing this 5-6 days per week.    A good intro video is: "Independence from Pain 7-minute Video" - [email or web address]/[email or web address]   His more advanced video is: "Powerful Posture and Pain Relief: 12 minutes of Foundation Training" - [email or web address]://youtu.[email or web address]   Do not try to attempt this entire video when first beginning.    Try breaking of each exercise that he goes into shorter segments.  Otherwise if they perform an exercise for 45 seconds, start with 15 seconds and rest and then resume with a begin the new activity.  Work your way up to doing this 12 minute video and I expect to see significant improvements in your pain.

## 2016-12-07 ENCOUNTER — Encounter: Payer: Self-pay | Admitting: Sports Medicine

## 2016-12-07 ENCOUNTER — Ambulatory Visit (INDEPENDENT_AMBULATORY_CARE_PROVIDER_SITE_OTHER): Payer: BLUE CROSS/BLUE SHIELD | Admitting: Sports Medicine

## 2016-12-07 ENCOUNTER — Telehealth: Payer: Self-pay | Admitting: Sports Medicine

## 2016-12-07 VITALS — BP 110/78 | HR 109 | Ht 63.0 in | Wt 180.4 lb

## 2016-12-07 DIAGNOSIS — M9902 Segmental and somatic dysfunction of thoracic region: Secondary | ICD-10-CM | POA: Diagnosis not present

## 2016-12-07 DIAGNOSIS — M9903 Segmental and somatic dysfunction of lumbar region: Secondary | ICD-10-CM | POA: Diagnosis not present

## 2016-12-07 DIAGNOSIS — M4724 Other spondylosis with radiculopathy, thoracic region: Secondary | ICD-10-CM

## 2016-12-07 DIAGNOSIS — M9908 Segmental and somatic dysfunction of rib cage: Secondary | ICD-10-CM | POA: Diagnosis not present

## 2016-12-07 DIAGNOSIS — M9901 Segmental and somatic dysfunction of cervical region: Secondary | ICD-10-CM

## 2016-12-07 DIAGNOSIS — M9904 Segmental and somatic dysfunction of sacral region: Secondary | ICD-10-CM

## 2016-12-07 DIAGNOSIS — M9905 Segmental and somatic dysfunction of pelvic region: Secondary | ICD-10-CM

## 2016-12-07 NOTE — Assessment & Plan Note (Signed)
She has overall been responding well to osteopathic manipulation, cervicothoracic stretching with towel and foundations exercises.  She did have a setback from having to sit for prolonged time during court proceedings for a custody hearing and feels as if this set her back slightly but overall she has made great progress.  We will plan to check in 4 weeks and repeat manipulation at that time if indicated.  Continue with home exercises and increased towel stretching.

## 2016-12-07 NOTE — Progress Notes (Signed)
OFFICE VISIT NOTE Sheri [name]. [name] at [facility] [phone]  Sheri [name] - 26 y.o. female MRN [id number]  Date of birth: 1990/12/28  Visit Date: 12/07/2016  PCP: Madelin Headings, MD   Referred by: Madelin Headings, MD  Orlie Dakin, CMA acting as scribe for Dr. Berline Chough.  SUBJECTIVE:   Chief Complaint  Patient presents with  . Follow-up    mid back pain   HPI: As below and per problem based documentation when appropriate.  Sheri [name] is an established patient presenting today in follow-up of back pain. She was last seen 11/09/16 for OMT. She would like to focus on her mid and lower back today. She does have radiation of pain into the right leg.     Review of Systems  Constitutional: Negative for chills and fever.  Respiratory: Negative for shortness of breath and wheezing.   Cardiovascular: Negative for chest pain and palpitations.  Musculoskeletal: Positive for back pain. Negative for falls.  Neurological: Negative for dizziness, tingling and headaches.  Endo/Heme/Allergies: Does not bruise/bleed easily.    Otherwise per HPI.  HISTORY & PERTINENT PRIOR DATA:  No specialty comments available. She reports that she quit smoking about a year ago. She has never used smokeless tobacco.   Recent Labs  04/27/16 1207  HGBA1C 5.2   Medications & Allergies reviewed per EMR Patient Active Problem List   Diagnosis Date Noted  . Thoracic radiculopathy due to degenerative joint disease of spine 10/13/2016  . Orthostatic hypotension 05/18/2016  . Hypokalemia 05/17/2016  . Syncope and collapse 05/16/2016  . PCOS (polycystic ovarian syndrome) 02/13/2015  . IUD (intrauterine device) in place 02/13/2015  . Hx of pulmonary embolus 02/13/2015  . Bipolar depression (HCC)    Past Medical History:  Diagnosis Date  . Alcohol addiction (HCC)   . Anxiety   . Bipolar depression (HCC)    under psych rx   . Depression   .  Drug addiction (HCC)   . Drug addiction in remission [facility])    heroin  . Enteritis due to Norovirus 05/18/2016  . PCOS (polycystic ovarian syndrome) 02/13/2015  . Pulmonary embolism (HCC) 2011   neg heme evaluation  felt to be from ocps   . Syncope and collapse 05/16/2016   Family History  Problem Relation Age of Onset  . Colon cancer Maternal Grandfather   . Hypertension Father   . Irritable bowel syndrome Father    Past Surgical History:  Procedure Laterality Date  . NO PAST SURGERIES     Social History   Occupational History  . nanny    Social History Main Topics  . Smoking status: Former Smoker    Quit date: 12/20/2015  . Smokeless tobacco: Never Used  . Alcohol use No  . Drug use: No  . Sexual activity: Not on file    OBJECTIVE:  VS:  HT:5\' 3"  (160 cm)   WT:180 lb 6.4 oz (81.8 kg)  BMI:32    BP:110/78  HR:(!) 109bpm  TEMP: ( )  RESP:97 % EXAM: Findings:  Adult female, no acute distress.  Alert and appropriate.  Bilateral upper extremities overall well aligned.  Sensation intact to light touch.  Negative straight leg raise bilaterally.  Good cervical range of motion although slightly   No pain with Spurling's compression test and Lhermitte's compression test.  Upper and lower extremity strength is 5+/5 in all myotomes.   Upper thoracic rotation limited to the Right, lower  thoracic limited to the left; both of approximately 15 compared to the left lateral side  OSTEOPATHIC/STRUCTURAL EXAM:   C3 FRS right C4 FRS left T2 FRS left T4 through T8 neutral rotated left, side bent right Rib 4 posterior on the right  L3 FRS left Right anterior innominate Left on left sacral torsion     No results found. ASSESSMENT & PLAN:     ICD-10-CM   1. Osteoarthritis of spine with radiculopathy, thoracic region M47.24 OSTEOPATHIC MANIPULATION TREATMENT  2. Somatic dysfunction of sacral region M99.04 OSTEOPATHIC MANIPULATION TREATMENT  3. Somatic dysfunction of thoracic  region M99.02 OSTEOPATHIC MANIPULATION TREATMENT  4. Somatic dysfunction of rib region M99.08 OSTEOPATHIC MANIPULATION TREATMENT  5. Somatic dysfunction of pelvis region M99.05 OSTEOPATHIC MANIPULATION TREATMENT  6. Somatic dysfunction of lumbar region M99.03 OSTEOPATHIC MANIPULATION TREATMENT  7. Somatic dysfunction of cervical region M99.01 OSTEOPATHIC MANIPULATION TREATMENT  8. Thoracic radiculopathy due to degenerative joint disease of spine M47.24   ================================================================= Thoracic radiculopathy due to degenerative joint disease of spine She has overall been responding well to osteopathic manipulation, cervicothoracic stretching with towel and foundations exercises.  She did have a setback from having to sit for prolonged time during court proceedings for a custody hearing and feels as if this set her back slightly but overall she has made great progress.  We will plan to check in 4 weeks and repeat manipulation at that time if indicated.  Continue with home exercises and increased towel stretching. ================================================================= Patient Instructions  Remember to do the towel stretches in addition to the foundations therapy ================================================================= Future Appointments Date Time Provider Department Center  01/04/2017 4:00 PM Andrena Mews, DO [facility] None    Follow-up: Return in about 4 weeks (around 01/04/2017).   CMA/ATC served as Neurosurgeon during this visit. History, Physical, and Plan performed by medical provider. Documentation and orders reviewed and attested to.      Gaspar Bidding, DO    [facility]

## 2016-12-07 NOTE — Procedures (Signed)
PROCEDURE NOTE : OSTEOPATHIC MANIPULATION The decision today to treat with Osteopathic Manipulative Therapy (OMT) was based on physical exam findings. Verbal consent was obtained after after explanation of risks, benefits and potential side effects, including acute pain flare, post manipulation soreness and need for repeat treatments.  If Cervical manipulation was performed additional time was spent discussing the associated minimal risk of  injury to neurovascular structures.  After consent was obtained manipulation was performed as below:            Regions treated:  Per billing codes          Techniques used:  Muscle Energy, MFR, HVLA, Rib inhibition and ART The patient tolerated the treatment well and reported Improved symptoms following treatment today. Patient was given medications, exercises, stretches and lifestyle modifications per AVS and verbally.

## 2016-12-07 NOTE — Telephone Encounter (Signed)
r/s patient to 2:30pm per Dr. Berline Chough leaving early.  Patient will come in 2:30pm instead of 3:30pm.  Ty,  -LL

## 2016-12-07 NOTE — Patient Instructions (Signed)
Remember to do the towel stretches in addition to the foundations therapy

## 2016-12-12 NOTE — Assessment & Plan Note (Signed)
She responded well to osteopathic question at last visit but did have a slight amount of soreness following this. This was repeated again today with overall improved comfort at the end of the session.  Continue with home therapeutic exercises previously outlined in per AVS.  PROCEDURE NOTE : OSTEOPATHIC MANIPULATION The decision today to treat with Osteopathic Manipulative Therapy (OMT) was based on physical exam findings. Verbal consent was obtained after after explanation of risks, benefits and potential side effects, including acute pain flare, post manipulation soreness and need for repeat treatments.  If Cervical manipulation was performed additional time was spent discussing the associated minimal risk of  injury to neurovascular structures.  After consent was obtained manipulation was performed as below:            Regions treated:  Per billing codes          Techniques used:  Muscle Energy, MFR, HVLA and ART The patient tolerated the treatment well and reported Improved symptoms following treatment today. Patient was given medications, exercises, stretches and lifestyle modifications per AVS and verbally.

## 2016-12-30 ENCOUNTER — Encounter: Payer: Self-pay | Admitting: Sports Medicine

## 2016-12-30 ENCOUNTER — Ambulatory Visit (INDEPENDENT_AMBULATORY_CARE_PROVIDER_SITE_OTHER): Payer: BLUE CROSS/BLUE SHIELD | Admitting: Sports Medicine

## 2016-12-30 VITALS — BP 112/80 | HR 82 | Ht 63.0 in | Wt 179.4 lb

## 2016-12-30 DIAGNOSIS — F17200 Nicotine dependence, unspecified, uncomplicated: Secondary | ICD-10-CM

## 2016-12-30 DIAGNOSIS — M4724 Other spondylosis with radiculopathy, thoracic region: Secondary | ICD-10-CM | POA: Diagnosis not present

## 2016-12-30 DIAGNOSIS — M9908 Segmental and somatic dysfunction of rib cage: Secondary | ICD-10-CM

## 2016-12-30 DIAGNOSIS — M9902 Segmental and somatic dysfunction of thoracic region: Secondary | ICD-10-CM

## 2016-12-30 DIAGNOSIS — M9901 Segmental and somatic dysfunction of cervical region: Secondary | ICD-10-CM | POA: Diagnosis not present

## 2016-12-30 DIAGNOSIS — F1911 Other psychoactive substance abuse, in remission: Secondary | ICD-10-CM | POA: Insufficient documentation

## 2016-12-30 DIAGNOSIS — F1111 Opioid abuse, in remission: Secondary | ICD-10-CM | POA: Insufficient documentation

## 2016-12-30 DIAGNOSIS — M9904 Segmental and somatic dysfunction of sacral region: Secondary | ICD-10-CM | POA: Diagnosis not present

## 2016-12-30 DIAGNOSIS — M9903 Segmental and somatic dysfunction of lumbar region: Secondary | ICD-10-CM

## 2016-12-30 HISTORY — DX: Nicotine dependence, unspecified, uncomplicated: F17.200

## 2016-12-30 NOTE — Assessment & Plan Note (Signed)
Overall doing great with osteopathic manipulation and foundations training.  She reports that she feels almost 100% better but continues to have occasional intermittent mid back and low back pain that has been responsive to osteopathic manipulation.  This was performed again today per procedure note.  We will have her continue with the exercises and plan to check in in 6 weeks with plan to repeat osteopathic manipulation if persistent exam findings and anticipate potential as needed follow-up after this.

## 2016-12-30 NOTE — Progress Notes (Signed)
OFFICE VISIT NOTE Sheri [name]. She[name] at [facility] [id number]  Sheri [name] - 26 y.o. female MRN [id number]  Date of birth: [birth year]  Visit Date: 12/30/2016  PCP: Madelin Headings, MD   Referred by: Madelin Headings, MD  Orlie Dakin, CMA acting as scribe for Dr. Berline Chough.  SUBJECTIVE:   Chief Complaint  Patient presents with  . Follow-up    mid and lower back pain   HPI: As below and per problem based documentation when appropriate.  Sheri [name] is an established patient presenting today in follow-up of mid and lower back pain. She was last seen 12/07/2016 and had OMT.   She reports relief of sx after her last visit. She would like to focus on her mid back pain, pain around the left shoulder, lower back pain, and radiation of pain into the LT leg. Radiation into the RT leg has resolved.     Review of Systems  Constitutional: Negative for chills and fever.  Respiratory: Negative for shortness of breath and wheezing.   Cardiovascular: Negative for chest pain, palpitations and leg swelling.  Gastrointestinal: Negative.   Genitourinary: Negative.   Musculoskeletal: Positive for back pain. Negative for falls.  Neurological: Negative for dizziness, tingling and headaches.  Endo/Heme/Allergies: Does not bruise/bleed easily.    Otherwise per HPI.  HISTORY & PERTINENT PRIOR DATA:  No specialty comments available. She reports that she quit smoking about a year ago. She has never used smokeless tobacco.   Recent Labs  04/27/16 1207  HGBA1C 5.2   Medications & Allergies reviewed per EMR Patient Active Problem List   Diagnosis Date Noted  . History of drug abuse 12/30/2016  . Thoracic radiculopathy due to degenerative joint disease of spine 10/13/2016  . Orthostatic hypotension 05/18/2016  . Hypokalemia 05/17/2016  . Syncope and collapse 05/16/2016  . PCOS (polycystic ovarian syndrome) 02/13/2015  . IUD (intrauterine  device) in place 02/13/2015  . Hx of pulmonary embolus 02/13/2015  . Bipolar depression (HCC)    Past Medical History:  Diagnosis Date  . Alcohol addiction (HCC)   . Anxiety   . Bipolar depression (HCC)    under psych rx   . Depression   . Drug addiction (HCC)   . Drug addiction in remission [facility])    heroin  . Enteritis due to Norovirus 05/18/2016  . PCOS (polycystic ovarian syndrome) 02/13/2015  . Pulmonary embolism (HCC) 2011   neg heme evaluation  felt to be from ocps   . Smoker 12/30/2016  . Syncope and collapse 05/16/2016   Family History  Problem Relation Age of Onset  . Colon cancer Maternal Grandfather   . Hypertension Father   . Irritable bowel syndrome Father    Past Surgical History:  Procedure Laterality Date  . NO PAST SURGERIES     Social History   Occupational History  . nanny    Social History Main Topics  . Smoking status: Former Smoker    Quit date: 12/20/2015  . Smokeless tobacco: Never Used  . Alcohol use No  . Drug use: No  . Sexual activity: Not on file    OBJECTIVE:  VS:  HT:5\' 3"  (160 cm)   WT:179 lb 6.4 oz (81.4 kg)  BMI:31.79    BP:112/80  HR:82bpm  TEMP: ( )  RESP:99 % EXAM: Findings:  Adult female.  In no acute distress.  Alert and appropriate.  Her thoracic mobility is overall markedly improved and she  has marked improvements in her posture with a neutral shoulder position and erect spine without forehead tilt.  She has only minimal tenderness to palpation of the bilateral paraspinal musculature.  She does have a slight restriction with side bending to the right and rotation of the left of the cervical spine.  Upper extremity sensation is intact.  Normal VBS insufficiency testing.     No results found. ASSESSMENT & PLAN:     ICD-10-CM   1. Thoracic radiculopathy due to degenerative joint disease of spine M47.24   2. Segmental and somatic dysfunction of cervical region M99.01 OSTEOPATHIC MANIPULATION TREATMENT  3. Segmental and  somatic dysfunction of thoracic region M99.02 OSTEOPATHIC MANIPULATION TREATMENT  4. Segmental and somatic dysfunction of lumbar region M99.03 OSTEOPATHIC MANIPULATION TREATMENT  5. Segmental and somatic dysfunction of sacral region M99.04 OSTEOPATHIC MANIPULATION TREATMENT  6. Segmental and somatic dysfunction of rib cage M99.08 OSTEOPATHIC MANIPULATION TREATMENT  ================================================================= Thoracic radiculopathy due to degenerative joint disease of spine Overall doing great with osteopathic manipulation and foundations training.  She reports that she feels almost 100% better but continues to have occasional intermittent mid back and low back pain that has been responsive to osteopathic manipulation.  This was performed again today per procedure note.  We will have her continue with the exercises and plan to check in in 6 weeks with plan to repeat osteopathic manipulation if persistent exam findings and anticipate potential as needed follow-up after this. =================================================================  Follow-up: Return in about 6 weeks (around 02/10/2017) for consideration of repeat Osteopathic Manipulation.   CMA/ATC served as Neurosurgeonscribe during this visit. History, Physical, and Plan performed by medical provider. Documentation and orders reviewed and attested to.      Gaspar BiddingMichael Kaylan Yates, DO    [facility]

## 2016-12-30 NOTE — Procedures (Signed)
PROCEDURE NOTE : OSTEOPATHIC MANIPULATION The decision today to treat with Osteopathic Manipulative Therapy (OMT) was based on physical exam findings. Verbal consent was obtained after after explanation of risks, benefits and potential side effects, including acute pain flare, post manipulation soreness and need for repeat treatments.  Additional time was spent discussing the minimal risk of  injury to neurovascular structures for associated Cervical manipulation.  After verbal consent was obtained manipulation was performed as below:            Regions treated: Per examined regions as below and associated billing codes          Techniques used: Muscle Energy, MFR, HVLA and ART The patient tolerated the treatment well and reported Improved symptoms following treatment today. Patient was given medications, exercises, stretches and lifestyle modifications per AVS and verbally.     OSTEOPATHIC/STRUCTURAL EXAM FINDINGS:    C2 through C4 FRS right  C6 extended rotated left, side bent right  T1 ERS left  T2 through T4 FRS right  Posterior rib 8 on the right  L2 FRS left  L4 FRS right  Left on left sacral torsion  Right anterior innominate

## 2017-01-04 ENCOUNTER — Ambulatory Visit: Payer: BLUE CROSS/BLUE SHIELD | Admitting: Sports Medicine

## 2017-01-08 ENCOUNTER — Encounter: Payer: Self-pay | Admitting: Internal Medicine

## 2017-02-03 ENCOUNTER — Ambulatory Visit (INDEPENDENT_AMBULATORY_CARE_PROVIDER_SITE_OTHER): Payer: BLUE CROSS/BLUE SHIELD | Admitting: Sports Medicine

## 2017-02-03 ENCOUNTER — Encounter: Payer: Self-pay | Admitting: Sports Medicine

## 2017-02-03 VITALS — BP 104/78 | HR 84 | Ht 63.0 in | Wt 181.0 lb

## 2017-02-03 DIAGNOSIS — M9904 Segmental and somatic dysfunction of sacral region: Secondary | ICD-10-CM

## 2017-02-03 DIAGNOSIS — M9903 Segmental and somatic dysfunction of lumbar region: Secondary | ICD-10-CM | POA: Diagnosis not present

## 2017-02-03 DIAGNOSIS — M9902 Segmental and somatic dysfunction of thoracic region: Secondary | ICD-10-CM | POA: Diagnosis not present

## 2017-02-03 DIAGNOSIS — M4724 Other spondylosis with radiculopathy, thoracic region: Secondary | ICD-10-CM

## 2017-02-03 DIAGNOSIS — M546 Pain in thoracic spine: Secondary | ICD-10-CM | POA: Diagnosis not present

## 2017-02-03 DIAGNOSIS — M9908 Segmental and somatic dysfunction of rib cage: Secondary | ICD-10-CM | POA: Diagnosis not present

## 2017-02-03 DIAGNOSIS — M9905 Segmental and somatic dysfunction of pelvic region: Secondary | ICD-10-CM

## 2017-02-03 NOTE — Progress Notes (Signed)
OFFICE VISIT NOTE Veverly FellsMichael D. Delorise Shinerigby, DO  [facility] [phone]  Jaynie CollinsCassandra Grace Craig - 26 y.o. female MRN [id number]  Date of birth: 02/21/1991  Visit Date: 02/03/2017  PCP: Madelin HeadingsPanosh, Wanda K, MD   Referred by: Madelin HeadingsPanosh, Wanda K, MD  Orlie DakinBrandy Shelton, CMA acting as scribe for Dr. Berline Choughigby.  SUBJECTIVE:   Chief Complaint  Patient presents with  . Follow-up    thoracic back pain, LT shoulder pain   HPI: As below and per problem based documentation when appropriate.  Elonda HuskyCassandra is an established patient presenting today in follow-up of thoracic back pain and LT shoulder pain. She was last seen 12/30/16 and received OMT.   Pt reports improvement in pain since her last visit. She did get some relief from OMT but pain has slowly started to come back. Today she would like to focus on her LT shoulder and back.     Review of Systems  Constitutional: Negative for chills and fever.  Respiratory: Negative for shortness of breath and wheezing.   Cardiovascular: Negative for chest pain and palpitations.  Gastrointestinal: Negative.   Genitourinary: Negative.   Musculoskeletal: Positive for back pain.  Neurological: Negative for dizziness, tingling and headaches.    Otherwise per HPI.  HISTORY & PERTINENT PRIOR DATA:  No specialty comments available. She reports that she quit smoking about 13 months ago. She has never used smokeless tobacco.   Recent Labs  04/27/16 1207  HGBA1C 5.2   Allergies reviewed per EMR Prior to Admission medications   Medication Sig Start Date End Date Taking? Authorizing Provider  famotidine (PEPCID) 20 MG tablet Take 1 tablet (20 mg total) by mouth 2 (two) times daily. Take with NSAID 11/09/16  Yes Andrena MewsRigby, Alessandria Henken D, DO  metFORMIN (GLUCOPHAGE-XR) 500 MG 24 hr tablet TAKE 1 TABLET BY MOUTH EVERY DAY FOR 1 WEEK THEN 1 TABLET TWICE A DAY 10/05/16  Yes [provider]  pantoprazole (PROTONIX) 40 MG tablet Take 1  tablet (40 mg total) by mouth daily. 10/14/16  Yes Panosh, Neta MendsWanda K, MD  PARAGARD INTRAUTERINE COPPER IUD IUD 1 each by Intrauterine route once.   Yes [provider]   Patient Active Problem List   Diagnosis Date Noted  . History of drug abuse 12/30/2016  . Thoracic radiculopathy due to degenerative joint disease of spine 10/13/2016  . Orthostatic hypotension 05/18/2016  . Hypokalemia 05/17/2016  . Syncope and collapse 05/16/2016  . PCOS (polycystic ovarian syndrome) 02/13/2015  . IUD (intrauterine device) in place 02/13/2015  . Hx of pulmonary embolus 02/13/2015  . Bipolar depression (HCC)    Past Medical History:  Diagnosis Date  . Alcohol addiction (HCC)   . Anxiety   . Bipolar depression (HCC)    under psych rx   . Depression   . Drug addiction (HCC)   . Drug addiction in remission [facility](HCC)    heroin  . Enteritis due to Norovirus 05/18/2016  . PCOS (polycystic ovarian syndrome) 02/13/2015  . Pulmonary embolism (HCC) 2011   neg heme evaluation  felt to be from ocps   . Smoker 12/30/2016  . Syncope and collapse 05/16/2016   Family History  Problem Relation Age of Onset  . Colon cancer Maternal Grandfather   . Hypertension Father   . Irritable bowel syndrome Father    Past Surgical History:  Procedure Laterality Date  . NO PAST SURGERIES     Social History   Occupational History  . nanny  Social History Main Topics  . Smoking status: Former Smoker    Quit date: 12/20/2015  . Smokeless tobacco: Never Used  . Alcohol use No  . Drug use: No  . Sexual activity: Not on file    OBJECTIVE:  VS:  HT:5\' 3"  (160 cm)   WT:181 lb (82.1 kg)  BMI:32.07    BP:104/78  HR:84bpm  TEMP: ( )  RESP:98 % EXAM: Findings:  Adult female.  No acute distress.  Alert and appropriate.  Left upper extremities overall held in slight protraction but well aligned.  Full overhead range of motion.  She has a.  Upper extremity strength is 5+/5.  No significant dysesthesia.     RADIOLOGY: DG Thoracic Spine W/Swimmers CLINICAL DATA:  Chronic mid back pain radiating to the left side of the back. No known injuries.  EXAM: THORACIC SPINE - 3 VIEWS  COMPARISON:  Bone window images from CTA chest 05/16/2016, 04/27/2016.  FINDINGS: Twelve rib-bearing thoracic vertebrae with anatomic posterior alignment. No fractures. Slight thoracolumbar dextroscoliosis. Disc space narrowing and endplate hypertrophic changes at T7-8, T8-9, T9- 10 and T10-11, worst at T10-11. No change since the prior CT bone window images.  IMPRESSION: No acute or subacute osseous abnormality. Mild lower thoracic degenerative disc disease and spondylosis.  Electronically Signed   By: Hulan Saas M.D.   On: 10/13/2016 12:41  ASSESSMENT & PLAN:     ICD-10-CM   1. Thoracic spine pain M54.6 Ambulatory referral to Physical Therapy    OSTEOPATHIC MANIPULATION TREATMENT  2. Thoracic radiculopathy due to degenerative joint disease of spine M47.24 OSTEOPATHIC MANIPULATION TREATMENT  3. Segmental and somatic dysfunction of thoracic region M99.02 OSTEOPATHIC MANIPULATION TREATMENT  4. Segmental and somatic dysfunction of lumbar region M99.03 OSTEOPATHIC MANIPULATION TREATMENT  5. Segmental and somatic dysfunction of sacral region M99.04 OSTEOPATHIC MANIPULATION TREATMENT  6. Segmental and somatic dysfunction of pelvic region M99.05 OSTEOPATHIC MANIPULATION TREATMENT  7. Segmental and somatic dysfunction of rib cage M99.08 OSTEOPATHIC MANIPULATION TREATMENT   ================================================================= Thoracic radiculopathy due to degenerative joint disease of spine Persistent thoracic and now upper cervical pain.  Responding well to osteopathic manipulation.  May be a candidate for dry needling.  Will refer to physical therapy and repeat osteopathic manipulation performed today.  PROCEDURE NOTE : OSTEOPATHIC MANIPULATION The decision today to treat with Osteopathic  Manipulative Therapy (OMT) was based on physical exam findings. Verbal consent was obtained after after explanation of risks, benefits and potential side effects, including acute pain flare, post manipulation soreness and need for repeat treatments.  Additional time was spent discussing the minimal risk of  injury to neurovascular structures for associated Cervical manipulation.  After verbal consent was obtained manipulation was performed as below:            Regions treated: Per examined regions as below and associated billing codes          Techniques used: Muscle Energy, MFR, HVLA and ART The patient tolerated the treatment well and reported Improved symptoms following treatment today. Patient was given medications, exercises, stretches and lifestyle modifications per AVS and verbally.     OSTEOPATHIC/STRUCTURAL EXAM FINDINGS:    C2 through C4 FRS right  C5 Flexed, Rotated R, Sidebend left  T2 through T4 FRS right  Posterior rib 8 on the right  L2 FRS left  L4 FRS right  Left on left sacral torsion  Right anterior innominate  ================================================================= Follow-up: No Follow-up on file.   CMA/ATC served as Neurosurgeon during this visit. History, Physical,  and Plan performed by medical provider. Documentation and orders reviewed and attested to.      Gaspar Bidding, DO    [facility]

## 2017-02-05 NOTE — Assessment & Plan Note (Signed)
Persistent thoracic and now upper cervical pain.  Responding well to osteopathic manipulation.  May be a candidate for dry needling.  Will refer to physical therapy and repeat osteopathic manipulation performed today.

## 2017-02-05 NOTE — Procedures (Signed)
PROCEDURE NOTE : OSTEOPATHIC MANIPULATION The decision today to treat with Osteopathic Manipulative Therapy (OMT) was based on physical exam findings. Verbal consent was obtained after after explanation of risks, benefits and potential side effects, including acute pain flare, post manipulation soreness and need for repeat treatments.  Additional time was spent discussing the minimal risk of  injury to neurovascular structures for associated Cervical manipulation.  After verbal consent was obtained manipulation was performed as below:            Regions treated: Per examined regions as below and associated billing codes          Techniques used: Muscle Energy, MFR, HVLA and ART The patient tolerated the treatment well and reported Improved symptoms following treatment today. Patient was given medications, exercises, stretches and lifestyle modifications per AVS and verbally.     OSTEOPATHIC/STRUCTURAL EXAM FINDINGS:    C2 through C4 FRS right  C5 Flexed, Rotated R, Sidebend left  T2 through T4 FRS right  Posterior rib 8 on the right  L2 FRS left  L4 FRS right  Left on left sacral torsion  Right anterior innominate

## 2017-02-15 ENCOUNTER — Ambulatory Visit: Payer: BLUE CROSS/BLUE SHIELD

## 2017-02-15 ENCOUNTER — Ambulatory Visit (INDEPENDENT_AMBULATORY_CARE_PROVIDER_SITE_OTHER): Payer: BLUE CROSS/BLUE SHIELD | Admitting: Physical Therapy

## 2017-02-15 ENCOUNTER — Ambulatory Visit (INDEPENDENT_AMBULATORY_CARE_PROVIDER_SITE_OTHER): Payer: BLUE CROSS/BLUE SHIELD

## 2017-02-15 DIAGNOSIS — Z23 Encounter for immunization: Secondary | ICD-10-CM

## 2017-02-15 DIAGNOSIS — M546 Pain in thoracic spine: Secondary | ICD-10-CM | POA: Diagnosis not present

## 2017-02-15 DIAGNOSIS — M791 Myalgia, unspecified site: Secondary | ICD-10-CM | POA: Diagnosis not present

## 2017-02-15 NOTE — Patient Instructions (Signed)
o    Sleeping on Back  Place pillow under knees. A pillow with cervical support and a roll around waist are also helpful. Copyright  VHI. All rights reserved.  Sleeping on Side Place pillow between knees. Use cervical support under neck and a roll around waist as needed. Copyright  VHI. All rights reserved.   Sleeping on Stomach   If this is the only desirable sleeping position, place pillow under lower legs, and under stomach or chest as needed.  Posture - Sitting   Sit upright, head facing forward. Try using a roll to support lower back. Keep shoulders relaxed, and avoid rounded back. Keep hips level with knees. Avoid crossing legs for long periods. Stand to Sit / Sit to Stand   To sit: Bend knees to lower self onto front edge of chair, then scoot back on seat. To stand: Reverse sequence by placing one foot forward, and scoot to front of seat. Use rocking motion to stand up.   Work Height and Reach  Ideal work height is no more than 2 to 4 inches below elbow level when standing, and at elbow level when sitting. Reaching should be limited to arm's length, with elbows slightly bent.  Bending  Bend at hips and knees, not back. Keep feet shoulder-width apart.    Posture - Standing   Good posture is important. Avoid slouching and forward head thrust. Maintain curve in low back and align ears over shoul- ders, hips over ankles.  Alternating Positions   Alternate tasks and change positions frequently to reduce fatigue and muscle tension. Take rest breaks. Computer Work   Position work to Art gallery managerface forward. Use proper work and seat height. Keep shoulders back and down, wrists straight, and elbows at right angles. Use chair that provides full back support. Add footrest and lumbar roll as needed.  Getting Into / Out of Car  Lower self onto seat, scoot back, then bring in one leg at a time. Reverse sequence to get out.  Dressing  Lie on back to pull socks or slacks over feet,  or sit and bend leg while keeping back straight.    Housework - Sink  Place one foot on ledge of cabinet under sink when standing at sink for prolonged periods.   Pushing / Pulling  Pushing is preferable to pulling. Keep back in proper alignment, and use leg muscles to do the work.  Deep Squat   Squat and lift with both arms held against upper trunk. Tighten stomach muscles without holding breath. Use smooth movements to avoid jerking.  Avoid Twisting   Avoid twisting or bending back. Pivot around using foot movements, and bend at knees if needed when reaching for articles.  Carrying Luggage   Distribute weight evenly on both sides. Use a cart whenever possible. Do not twist trunk. Move body as a unit.   Lifting Principles .Maintain proper posture and head alignment. .Slide object as close as possible before lifting. .Move obstacles out of the way. .Test before lifting; ask for help if too heavy. .Tighten stomach muscles without holding breath. .Use smooth movements; do not jerk. .Use legs to do the work, and pivot with feet. .Distribute the work load symmetrically and close to the center of trunk. .Push instead of pull whenever possible.   Ask For Help   Ask for help and delegate to others when possible. Coordinate your movements when lifting together, and maintain the low back curve.  Log Roll   Lying on back, bend left  knee and place left arm across chest. Roll all in one movement to the right. Reverse to roll to the left. Always move as one unit. Housework - Sweeping  Use long-handled equipment to avoid stooping.   Housework - Wiping  Position yourself as close as possible to reach work surface. Avoid straining your back.  Laundry - Unloading Wash   To unload small items at bottom of washer, lift leg opposite to arm being used to reach.  Gardening - Raking  Move close to area to be raked. Use arm movements to do the work. Keep back straight and  avoid twisting.     Cart  When reaching into cart with one arm, lift opposite leg to keep back straight.   Getting Into / Out of Bed  Lower self to lie down on one side by raising legs and lowering head at the same time. Use arms to assist moving without twisting. Bend both knees to roll onto back if desired. To sit up, start from lying on side, and use same move-ments in reverse. Housework - Vacuuming  Hold the vacuum with arm held at side. Step back and forth to move it, keeping head up. Avoid twisting.   Laundry - Armed forces training and education officer so that bending and twisting can be avoided.   Laundry - Unloading Dryer  Squat down to reach into clothes dryer or use a reacher.  Gardening - Weeding / Psychiatric nurse or Kneel. Knee pads may be helpful.                     Trigger Point Dry Needling  . What is Trigger Point Dry Needling (DN)? o DN is a physical therapy technique used to treat muscle pain and dysfunction. Specifically, DN helps deactivate muscle trigger points (muscle knots).  o A thin filiform needle is used to penetrate the skin and stimulate the underlying trigger point. The goal is for a local twitch response (LTR) to occur and for the trigger point to relax. No medication of any kind is injected during the procedure.   . What Does Trigger Point Dry Needling Feel Like?  o The procedure feels different for each individual patient. Some patients report that they do not actually feel the needle enter the skin and overall the process is not painful. Very mild bleeding may occur. However, many patients feel a deep cramping in the muscle in which the needle was inserted. This is the local twitch response.   Marland Kitchen How Will I feel after the treatment? o Soreness is normal, and the onset of soreness may not occur for a few hours. Typically this soreness does not last longer than two days.  o Bruising is uncommon, however; ice can be used to decrease  any possible bruising.  o In rare cases feeling tired or nauseous after the treatment is normal. In addition, your symptoms may get worse before they get better, this period will typically not last longer than 24 hours.   . What Can I do After My Treatment? o Increase your hydration by drinking more water for the next 24 hours. o You may place ice or heat on the areas treated that have become sore, however, do not use heat on inflamed or bruised areas. Heat often brings more relief post needling. o You can continue your regular activities, but vigorous activity is not recommended initially after the treatment for 24 hours. o DN is best combined with other physical therapy  such as strengthening, stretching, and other therapies.

## 2017-02-15 NOTE — Therapy (Signed)
[facility] [address] [place], [place], [address] Phone: [phone]   Fax:  [phone]  Physical Therapy Evaluation  Patient Details  Name: Sheri [name] MRN: [id number] Date of Birth: [birth year] Referring Provider: Dr. Gaspar Bidding  Encounter Date: 02/15/2017      PT End of Session - 02/15/17 0837    Visit Number 1   Number of Visits 12   Date for PT Re-Evaluation 03/29/17   Authorization Type [employer] $40 copay   Authorization - Number of Visits 26   PT Start Time 0800   PT Stop Time 0837   PT Time Calculation (min) 37 min   Activity Tolerance Patient tolerated treatment well   Behavior During Therapy [facility] for tasks assessed/performed      Past Medical History:  Diagnosis Date  . Alcohol addiction (HCC)   . Anxiety   . Bipolar depression (HCC)    under psych rx   . Depression   . Drug addiction (HCC)   . Drug addiction in remission [facility])    heroin  . Enteritis due to Norovirus 05/18/2016  . PCOS (polycystic ovarian syndrome) 02/13/2015  . Pulmonary embolism (HCC) 2011   neg heme evaluation  felt to be from ocps   . Smoker 12/30/2016  . Syncope and collapse 05/16/2016    Past Surgical History:  Procedure Laterality Date  . NO PAST SURGERIES      There were no vitals filed for this visit.       Subjective Assessment - 02/15/17 0802    Subjective Pt is a 26 y/o female who presents to OPPT for mid back pain x 1.5 years.  Pt reports continued intermittent pain when previously was constant.     Limitations Lifting   Patient Stated Goals improve pain and strength   Currently in Pain? Yes   Pain Score 0-No pain  up to 5/10   Pain Location Back   Pain Orientation Mid;Left  Lt worse than Rt   Pain Descriptors / Indicators Pressure;Aching   Pain Type Chronic pain   Pain Onset More than a month ago   Pain Frequency Intermittent   Aggravating Factors  bending, lifting, Rt arm falls asleep at night   Pain  Relieving Factors manipulations, stretching/yoga, ibuprofen and heat, walking            [facility] PT Assessment - 02/15/17 0806      Assessment   Medical Diagnosis thoracic spine pain   Referring Provider Dr. Gaspar Bidding   Onset Date/Surgical Date --  1.5 yrs   Hand Dominance Right   Next MD Visit 03/01/17   Prior Therapy none for this condition     Precautions   Precautions None     Restrictions   Weight Bearing Restrictions No     Balance Screen   Has the patient fallen in the past 6 months No   Has the patient had a decrease in activity level because of a fear of falling?  No   Is the patient reluctant to leave their home because of a fear of falling?  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  part time Designer, jewellery - public health   Leisure planning a wedding     Cognition   Overall  Cognitive Status Within Functional Limits for tasks assessed     Posture/Postural Control   Posture/Postural Control Postural limitations   Postural Limitations Rounded Shoulders;Forward head     ROM / Strength   AROM / PROM / Strength Strength     Strength   Overall Strength Within functional limits for tasks performed     Palpation   Spinal mobility hypomobile thoracic spine T6/7-T10/11   Palpation comment trigger points noted in Lt thoracic paraspinals and rhomboids; c/o pain and tightness under scapula            Objective measurements completed on examination: See above findings.          [facility] Adult PT Treatment/Exercise - 02/15/17 0806      Self-Care   Self-Care Lifting   Lifting educated on proper technique as well as decreasing strain on thoracic spine with ADLs                PT Education - 02/15/17 0840    Education provided Yes   Education Details posture/body  mechanics; DN   Person(s) Educated Patient   Methods Explanation;Handout   Comprehension Verbalized understanding             PT Long Term Goals - 02/15/17 1153      PT LONG TERM GOAL #1   Title report pain < 2/10 for improved function and mobility   Status New   Target Date 03/29/17     PT LONG TERM GOAL #2   Title demonstrate proper lifting and biomechanics with ADLs for decreased risk of reinjury   Status New   Target Date 03/29/17                Plan - 02/15/17 1148    Clinical Impression Statement Pt is a 26 y/o female who presents to OPPT for upper to mid thoracic back pain.  Pt demonstrates abnormal posture, improper body mechanics, and active trigger points affecting pain and work responsibilities.  Pt will benefit from PT to address deficits listed.   History and Personal Factors relevant to plan of care: Bipolar depression, hx of PE, hx of ETOH and drug abuse   Clinical Presentation Stable   Clinical Decision Making Low   Rehab Potential Good   PT Frequency 2x / week  1-2x/wk   PT Duration 6 weeks   PT Treatment/Interventions ADLs/Self Care Home Management;Cryotherapy;Electrical Stimulation;Moist Heat;Ultrasound;Therapeutic exercise;Therapeutic activities;Patient/family education;Manual techniques;Dry needling;Taping   PT Next Visit Plan DN to thoracic paraspinals/rhomboids/subscapularis, manual, modalities PRN   Consulted and Agree with Plan of Care Patient      Patient will benefit from skilled therapeutic intervention in order to improve the following deficits and impairments:  Increased fascial restricitons, Increased muscle spasms, Pain, Postural dysfunction, Improper body mechanics  Visit Diagnosis: Pain in thoracic spine - Plan: PT plan of care cert/re-cert  Myalgia - Plan: PT plan of care cert/re-cert     Problem List Patient Active Problem List   Diagnosis Date Noted  . History of drug abuse 12/30/2016  . Thoracic radiculopathy due to  degenerative joint disease of spine 10/13/2016  . Orthostatic hypotension 05/18/2016  . Hypokalemia 05/17/2016  . Syncope and collapse 05/16/2016  . PCOS (polycystic ovarian syndrome) 02/13/2015  . IUD (intrauterine device) in place 02/13/2015  . Hx of pulmonary embolus 02/13/2015  . Bipolar depression (HCC)      Clarita Crane, PT, DPT 02/15/17 11:57 AM    [facility] [address] [place], [place], [address] Phone: [phone]   Fax:  [phone]  Name: Sheri [name] MRN: [id number] Date of Birth: 06/01/1990

## 2017-02-22 ENCOUNTER — Ambulatory Visit: Payer: BLUE CROSS/BLUE SHIELD | Admitting: Physical Therapy

## 2017-02-22 ENCOUNTER — Encounter: Payer: Self-pay | Admitting: Physical Therapy

## 2017-02-22 DIAGNOSIS — M791 Myalgia, unspecified site: Secondary | ICD-10-CM | POA: Diagnosis not present

## 2017-02-22 DIAGNOSIS — M546 Pain in thoracic spine: Secondary | ICD-10-CM | POA: Diagnosis not present

## 2017-02-22 NOTE — Patient Instructions (Signed)
  TARGET BALL:  Turn towards the main section of the store and head towards the cards.  Turn at the cards and head towards the back of the store.  Just past the home goods before you get to the toys you will see an end cap with "party favor" type toys.  The ball is located in one of these bins.  Look for the softball sized ball that lights up.  It should be around $5.  

## 2017-02-22 NOTE — Therapy (Signed)
[facility] [address] [place], [place], [address] Phone: [phone]   Fax:  [phone]  Physical Therapy Treatment  Patient Details  Name: Sheri CollinsCassandra Grace Craig MRN: [id number] Date of Birth: 08/16/1990 Referring Provider: Dr. Gaspar BiddingMichael Rigby   Encounter Date: 02/22/2017  PT End of Session - 02/22/17 0922    Visit Number  2    Number of Visits  12    Date for PT Re-Evaluation  03/29/17    Authorization Type  [employer] $40 copay    Authorization - Number of Visits  26    PT Start Time  0847    PT Stop Time  0935    PT Time Calculation (min)  48 min    Activity Tolerance  Patient tolerated treatment well    Behavior During Therapy  [facility] for tasks assessed/performed       Past Medical History:  Diagnosis Date  . Alcohol addiction (HCC)   . Anxiety   . Bipolar depression (HCC)    under psych rx   . Depression   . Drug addiction (HCC)   . Drug addiction in remission [facility](HCC)    heroin  . Enteritis due to Norovirus 05/18/2016  . PCOS (polycystic ovarian syndrome) 02/13/2015  . Pulmonary embolism (HCC) 2011   neg heme evaluation  felt to be from ocps   . Smoker 12/30/2016  . Syncope and collapse 05/16/2016    Past Surgical History:  Procedure Laterality Date  . NO PAST SURGERIES      There were no vitals filed for this visit.  Subjective Assessment - 02/22/17 0849    Subjective  pain as been worse; pretty active week with working and taking care of small child.     Patient Stated Goals  improve pain and strength    Pain Score  0-No pain woke up sore   woke up sore                     [facility] Adult PT Treatment/Exercise - 02/22/17 0920      Modalities   Modalities  Electrical Stimulation;Moist Heat      Moist Heat Therapy   Number Minutes Moist Heat  15 Minutes    Moist Heat Location  Other (comment) thoracic spine   thoracic spine     Electrical Stimulation   Electrical Stimulation Location  thoracic spine     Electrical Stimulation Action  IFC    Electrical Stimulation Parameters  to tolerance x 15 min    Electrical Stimulation Goals  Pain      Manual Therapy   Manual Therapy  Soft tissue mobilization    Manual therapy comments  pt prone    Soft tissue mobilization  bil thoracic paraspinals; instructed in use of ball for STM at home       Trigger Point Dry Needling - 02/22/17 [id number]    Consent Given?  Yes    Education Handout Provided  Yes    Muscles Treated Upper Body  Subscapularis;Longissimus;Rhomboids    Rhomboids Response  Twitch response elicited;Palpable increased muscle length    Subscapularis Response  Twitch response elicited    Longissimus Response  Twitch response elicited;Palpable increased muscle length thoracic paraspinals   thoracic paraspinals          PT Education - 02/22/17 [id number]    Education provided  Yes    Education Details  self STM with ball, verbally reviewed DN    Person(s) Educated  Patient  Methods  Explanation;Handout    Comprehension  Verbalized understanding          PT Long Term Goals - 02/15/17 1153      PT LONG TERM GOAL #1   Title  report pain < 2/10 for improved function and mobility    Status  New    Target Date  03/29/17      PT LONG TERM GOAL #2   Title  demonstrate proper lifting and biomechanics with ADLs for decreased risk of reinjury    Status  New    Target Date  03/29/17            Plan - 02/22/17 8119    Clinical Impression Statement  Pt tolerated DN well today with good twitch responses in all muscle groups.  Reports some soreness following DN which is to be expected.  Will continue to benefit from PT to maximize function.    PT Treatment/Interventions  ADLs/Self Care Home Management;Cryotherapy;Electrical Stimulation;Moist Heat;Ultrasound;Therapeutic exercise;Therapeutic activities;Patient/family education;Manual techniques;Dry needling;Taping    PT Next Visit Plan  assess response to DN, continue manual, DN,  modalities PRN    Consulted and Agree with Plan of Care  Patient       Patient will benefit from skilled therapeutic intervention in order to improve the following deficits and impairments:  Increased fascial restricitons, Increased muscle spasms, Pain, Postural dysfunction, Improper body mechanics  Visit Diagnosis: Pain in thoracic spine  Myalgia     Problem List Patient Active Problem List   Diagnosis Date Noted  . History of drug abuse 12/30/2016  . Thoracic radiculopathy due to degenerative joint disease of spine 10/13/2016  . Orthostatic hypotension 05/18/2016  . Hypokalemia 05/17/2016  . Syncope and collapse 05/16/2016  . PCOS (polycystic ovarian syndrome) 02/13/2015  . IUD (intrauterine device) in place 02/13/2015  . Hx of pulmonary embolus 02/13/2015  . Bipolar depression (HCC)       Clarita Crane, PT, DPT 02/22/17 9:24 AM    [facility] [address] [place], [place], [address] Phone: [phone]   Fax:  [phone]  Name: Jelisa Colfax MRN: [id number] Date of Birth: [birth year]

## 2017-03-01 ENCOUNTER — Ambulatory Visit: Payer: BLUE CROSS/BLUE SHIELD | Admitting: Sports Medicine

## 2017-03-02 ENCOUNTER — Ambulatory Visit: Payer: BLUE CROSS/BLUE SHIELD | Admitting: Sports Medicine

## 2017-03-03 ENCOUNTER — Encounter: Payer: Self-pay | Admitting: Physical Therapy

## 2017-03-03 ENCOUNTER — Ambulatory Visit: Payer: BLUE CROSS/BLUE SHIELD | Admitting: Physical Therapy

## 2017-03-03 DIAGNOSIS — M546 Pain in thoracic spine: Secondary | ICD-10-CM

## 2017-03-03 DIAGNOSIS — M791 Myalgia, unspecified site: Secondary | ICD-10-CM | POA: Diagnosis not present

## 2017-03-03 NOTE — Therapy (Signed)
[facility] [address] [place], [place], [address] Phone: [phone]   Fax:  [phone]  Physical Therapy Treatment  Patient Details  Name: Sheri CollinsCassandra [name] MRN: [id number] Date of Birth: 08/05/1990 Referring Provider: Dr. Gaspar BiddingMichael Rigby   Encounter Date: 03/03/2017  PT End of Session - 03/03/17 1031    Visit Number  3    Number of Visits  12    Date for PT Re-Evaluation  03/29/17    Authorization Type  [employer] $40 copay    Authorization - Number of Visits  26    PT Start Time  0800    PT Stop Time  0840    PT Time Calculation (min)  40 min    Activity Tolerance  Patient tolerated treatment well    Behavior During Therapy  [facility] for tasks assessed/performed       Past Medical History:  Diagnosis Date  . Alcohol addiction (HCC)   . Anxiety   . Bipolar depression (HCC)    under psych rx   . Depression   . Drug addiction (HCC)   . Drug addiction in remission [facility](HCC)    heroin  . Enteritis due to Norovirus 05/18/2016  . PCOS (polycystic ovarian syndrome) 02/13/2015  . Pulmonary embolism (HCC) 2011   neg heme evaluation  felt to be from ocps   . Smoker 12/30/2016  . Syncope and collapse 05/16/2016    Past Surgical History:  Procedure Laterality Date  . NO PAST SURGERIES      There were no vitals filed for this visit.  Subjective Assessment - 03/03/17 0800    Subjective  reports back feels "different."  lower back has increased and unable to have manipulations.  thoracic spine feels "looser."    Limitations  Lifting    Patient Stated Goals  improve pain and strength    Pain Score  0-No pain                      [facility] Adult PT Treatment/Exercise - 03/03/17 0828      Exercises   Exercises  Lumbar      Lumbar Exercises: Stretches   Single Knee to Chest Stretch  2 reps;30 seconds    Double Knee to Chest Stretch  2 reps;30 seconds    Lower Trunk Rotation  2 reps;30 seconds    Piriformis Stretch  2  reps;30 seconds      Lumbar Exercises: Supine   Ab Set  10 reps;5 seconds      Lumbar Exercises: Prone   Other Prone Lumbar Exercises  "W" arms x 10      Manual Therapy   Manual Therapy  Soft tissue mobilization    Manual therapy comments  pt prone    Soft tissue mobilization  IASTM bil thoracic paraspinals       Trigger Point Dry Needling - 03/03/17 0843    Consent Given?  Yes    Muscles Treated Upper Body  Subscapularis;Longissimus;Rhomboids    Rhomboids Response  Twitch response elicited;Palpable increased muscle length    Subscapularis Response  Twitch response elicited;Palpable increased muscle length    Longissimus Response  Twitch response elicited;Palpable increased muscle length Rt thoracic paraspinals           PT Education - 03/03/17 1031    Education provided  Yes    Education Details  HEP    Person(s) Educated  Patient    Methods  Explanation;Demonstration;Handout    Comprehension  Verbalized understanding;Returned demonstration          PT Long Term Goals - 02/15/17 1153      PT LONG TERM GOAL #1   Title  report pain < 2/10 for improved function and mobility    Status  New    Target Date  03/29/17      PT LONG TERM GOAL #2   Title  demonstrate proper lifting and biomechanics with ADLs for decreased risk of reinjury    Status  New    Target Date  03/29/17            Plan - 03/03/17 1032    Clinical Impression Statement  Pt tolerated DN to Rt side well today with IASTM following DN and decrease in trigger points noted following session.  Progressed HEP today to include low back stretches as well as initiating core and posture strengthening.  Will continue to benefit from PT to maximize function.    PT Treatment/Interventions  ADLs/Self Care Home Management;Cryotherapy;Electrical Stimulation;Moist Heat;Ultrasound;Therapeutic exercise;Therapeutic activities;Patient/family education;Manual techniques;Dry needling;Taping    PT Next Visit Plan   assess response to DN, continue manual, DN, modalities PRN, review HEP and continue postural and strengthening exercises    Consulted and Agree with Plan of Care  Patient       Patient will benefit from skilled therapeutic intervention in order to improve the following deficits and impairments:  Increased fascial restricitons, Increased muscle spasms, Pain, Postural dysfunction, Improper body mechanics  Visit Diagnosis: Pain in thoracic spine  Myalgia     Problem List Patient Active Problem List   Diagnosis Date Noted  . History of drug abuse 12/30/2016  . Thoracic radiculopathy due to degenerative joint disease of spine 10/13/2016  . Orthostatic hypotension 05/18/2016  . Hypokalemia 05/17/2016  . Syncope and collapse 05/16/2016  . PCOS (polycystic ovarian syndrome) 02/13/2015  . IUD (intrauterine device) in place 02/13/2015  . Hx of pulmonary embolus 02/13/2015  . Bipolar depression (HCC)      Clarita CraneStephanie F Jeffre Enriques, PT, DPT 03/03/17 10:37 AM    [facility] [address] [place], [place], [address] Phone: [phone]   Fax:  [phone]  Name: Sheri CollinsCassandra [name] MRN: [id number] Date of Birth: 10/23/1990

## 2017-03-03 NOTE — Patient Instructions (Signed)
Knee to Chest    Lying supine, bend involved knee to chest _2-3__ times. Repeat with other leg.  Hold for 20-30 seconds. Do _2-3__ times per day.   Double Knee to Chest (Flexion)    Gently pull both knees toward chest. Feel stretch in lower back or buttock area. Breathing deeply, Hold __20-30__ seconds. Repeat __2-3__ times. Do _2-3___ sessions per day.  Lower Trunk Rotation Stretch    Keeping back flat and feet together, rotate knees to left side. Hold _20-30___ seconds. Repeat _2-3___ times per set. Do __1__ sets per session. Do __2-3__ sessions per day.           Piriformis Stretch    Lying on back, pull right knee toward opposite shoulder. Hold __20-30__ seconds. Repeat __2-3__ times. Do __2-3__ sessions per day.   Pelvic Tilt    Flatten back by tightening stomach muscles and buttocks.  Hold for 5 seconds. Repeat __10__ times per set. Do __1__ sets per session. Do __2-3__ sessions per day.  Scapular Retraction: Abduction (Prone)    Lie with upper arms straight out from sides, elbows bent to 90. Pinch shoulder blades together and raise arms a few inches from floor. Repeat _10___ times per set. Do __1__ sets per session. Do __2-3__ sessions per day.

## 2017-03-08 ENCOUNTER — Ambulatory Visit: Payer: BLUE CROSS/BLUE SHIELD | Admitting: Physical Therapy

## 2017-03-08 DIAGNOSIS — M546 Pain in thoracic spine: Secondary | ICD-10-CM | POA: Diagnosis not present

## 2017-03-08 NOTE — Therapy (Signed)
[facility] [address] [place], [place], [address] Phone: [phone]   Fax:  [phone]  Physical Therapy Treatment  Patient Details  Name: Sheri CollinsCassandra [name] MRN: [id number] Date of Birth: 01/17/1991 Referring Provider: Dr. Gaspar BiddingMichael Rigby   Encounter Date: 03/08/2017  PT End of Session - 03/08/17 0805    Visit Number  4    Number of Visits  12    Date for PT Re-Evaluation  03/29/17    Authorization Type  [employer] $40 copay    Authorization - Number of Visits  26    PT Start Time  0804    PT Stop Time  0844    PT Time Calculation (min)  40 min    Activity Tolerance  Patient tolerated treatment well    Behavior During Therapy  [facility] for tasks assessed/performed       Past Medical History:  Diagnosis Date  . Alcohol addiction (HCC)   . Anxiety   . Bipolar depression (HCC)    under psych rx   . Depression   . Drug addiction (HCC)   . Drug addiction in remission [facility](HCC)    heroin  . Enteritis due to Norovirus 05/18/2016  . PCOS (polycystic ovarian syndrome) 02/13/2015  . Pulmonary embolism (HCC) 2011   neg heme evaluation  felt to be from ocps   . Smoker 12/30/2016  . Syncope and collapse 05/16/2016    Past Surgical History:  Procedure Laterality Date  . NO PAST SURGERIES      There were no vitals filed for this visit.  Subjective Assessment - 03/08/17 0805    Subjective  Patient reports her right shoulder feels normal. The left one is starting to tense up. Her main complaint is her low back where she is feeling pain in Bil hips into lower legs.    Limitations  Lifting    Patient Stated Goals  improve pain and strength    Currently in Pain?  Yes    Pain Score  1     Pain Location  Back    Pain Orientation  Mid;Left    Pain Onset  More than a month ago                      [name]/Exercise - 03/08/17 0001      Exercises   Exercises  Shoulder      Lumbar Exercises: Stretches   ITB  Stretch  1 rep;60 seconds SDLY with pillow for L QL      Lumbar Exercises: Supine   Other Supine Lumbar Exercises  thoracic extension over bolster      Shoulder Exercises: Standing   Extension  Strengthening;Both;10 reps;Theraband    Theraband Level (Shoulder Extension)  Level 3 (Green)    Row  Strengthening;Both;10 reps;Theraband    Theraband Level (Shoulder Row)  Level 3 (Green)      Manual Therapy   Manual Therapy  Joint mobilization;Soft tissue mobilization;Myofascial release    Joint Mobilization  PA mobs unilateral and central Gd II/III    Soft tissue mobilization  to L thoracic paraspinals, rhomboids and , B UT    Myofascial Release  to L rhomboids       Trigger Point Dry Needling - 03/08/17 0843    Consent Given?  Yes    Muscles Treated Upper Body  Upper trapezius;Levator scapulae;Rhomboids;Longissimus L except Bil T multifidi T7-9    Upper Trapezius Response  Twitch reponse elicited;Palpable increased  muscle length    Levator Scapulae Response  Twitch response elicited;Palpable increased muscle length    Rhomboids Response  Twitch response elicited;Palpable increased muscle length    Longissimus Response  Twitch response elicited;Palpable increased muscle length           PT Education - 03/08/17 1307    Education provided  Yes    Education Details  HEP    Person(s) Educated  Patient    Methods  Explanation;Demonstration;Verbal cues;Tactile cues    Comprehension  Verbalized understanding;Returned demonstration          PT Long Term Goals - 02/15/17 1153      PT LONG TERM GOAL #1   Title  report pain < 2/10 for improved function and mobility    Status  New    Target Date  03/29/17      PT LONG TERM GOAL #2   Title  demonstrate proper lifting and biomechanics with ADLs for decreased risk of reinjury    Status  New    Target Date  03/29/17            Plan - 03/08/17 1308    Clinical Impression Statement  Patient presents today accompanied by her  fiance. She reports pain in her L shoulder and continued pain in mid back as well as low back. She had decrease mobility with PA mobs to mid thoracic spine as well as palpable TPs in L rhomboids. She responded well to DN to T7-9 as well as other muscles needled. She will benefit from further upper back strengthening as well as chest opening and thoracic extension.    PT Frequency  2x / week    PT Duration  6 weeks    PT Treatment/Interventions  ADLs/Self Care Home Management;Cryotherapy;Electrical Stimulation;Moist Heat;Ultrasound;Therapeutic exercise;Therapeutic activities;Patient/family education;Manual techniques;Dry needling;Taping    PT Next Visit Plan  assess response to DN, continue manual, DN, modalities PRN, review HEP and continue postural and strengthening exercises    PT Home Exercise Plan  thoracic extension       Patient will benefit from skilled therapeutic intervention in order to improve the following deficits and impairments:  Increased fascial restricitons, Increased muscle spasms, Pain, Postural dysfunction, Improper body mechanics  Visit Diagnosis: Pain in thoracic spine     Problem List Patient Active Problem List   Diagnosis Date Noted  . History of drug abuse 12/30/2016  . Thoracic radiculopathy due to degenerative joint disease of spine 10/13/2016  . Orthostatic hypotension 05/18/2016  . Hypokalemia 05/17/2016  . Syncope and collapse 05/16/2016  . PCOS (polycystic ovarian syndrome) 02/13/2015  . IUD (intrauterine device) in place 02/13/2015  . Hx of pulmonary embolus 02/13/2015  . Bipolar depression (HCC)     Solon PalmJulie Fallyn Munnerlyn PT 03/08/2017, 1:15 PM  [facility] [address] [place], [place], [address] Phone: [phone]   Fax:  [phone]  Name: Sheri CollinsCassandra [name] MRN: [id number] Date of Birth: 11/12/1990

## 2017-03-16 ENCOUNTER — Ambulatory Visit: Payer: BLUE CROSS/BLUE SHIELD | Admitting: Physical Therapy

## 2017-03-16 ENCOUNTER — Encounter: Payer: Self-pay | Admitting: Physical Therapy

## 2017-03-16 DIAGNOSIS — M546 Pain in thoracic spine: Secondary | ICD-10-CM

## 2017-03-16 DIAGNOSIS — M791 Myalgia, unspecified site: Secondary | ICD-10-CM | POA: Diagnosis not present

## 2017-03-16 NOTE — Therapy (Signed)
[facility] [address] [place], [place], [address] Phone: [phone]   Fax:  [phone]  Physical Therapy Treatment  Patient Details  Name: Sheri CollinsCassandra [name] MRN: [id number] Date of Birth: 10/11/1990 Referring Provider: Dr. Gaspar BiddingMichael Rigby   Encounter Date: 03/16/2017  PT End of Session - 03/16/17 0837    Visit Number  5    Number of Visits  12    Date for PT Re-Evaluation  03/29/17    Authorization Type  [employer] $40 copay    Authorization - Number of Visits  26    PT Start Time  0800    PT Stop Time  0832    PT Time Calculation (min)  32 min    Activity Tolerance  Patient tolerated treatment well    Behavior During Therapy  [facility] for tasks assessed/performed       Past Medical History:  Diagnosis Date  . Alcohol addiction (HCC)   . Anxiety   . Bipolar depression (HCC)    under psych rx   . Depression   . Drug addiction (HCC)   . Drug addiction in remission [facility](HCC)    heroin  . Enteritis due to Norovirus 05/18/2016  . PCOS (polycystic ovarian syndrome) 02/13/2015  . Pulmonary embolism (HCC) 2011   neg heme evaluation  felt to be from ocps   . Smoker 12/30/2016  . Syncope and collapse 05/16/2016    Past Surgical History:  Procedure Laterality Date  . NO PAST SURGERIES      There were no vitals filed for this visit.  Subjective Assessment - 03/16/17 0801    Subjective  having more good days than bad days. still a little stiff and aching in mornings but improves with stretching and heat.  doing yoga and exercises which is helping.    Patient Stated Goals  improve pain and strength    Currently in Pain?  No/denies                      [facility] Treatment/Exercise - 03/16/17 0807      Self-Care   Lifting  educated on management of symptoms and current progress. pt verbalized understanding.      Lumbar Exercises: Supine   Other Supine Lumbar Exercises  thoracic extension over bolster      Shoulder Exercises: Prone   Horizontal ABduction 1  Both;10 reps;Weights 3 sets    Horizontal ABduction 1 Weight (lbs)  2      Shoulder Exercises: Standing   Extension  Strengthening;Both;10 reps;Theraband 3 sets    Theraband Level (Shoulder Extension)  Level 3 (Green)    Row  Strengthening;Both;10 reps;Theraband 3 sets    Theraband Level (Shoulder Row)  Level 3 (Green)    Other Standing Exercises  trunk rotation to midline with green theraband 3x10 bil             PT Education - 03/16/17 0837    Education provided  Yes    Education Details  strengthening/stabilization HEP    Person(s) Educated  Patient    Methods  Explanation;Demonstration;Handout    Comprehension  Verbalized understanding;Returned demonstration          PT Long Term Goals - 02/15/17 1153      PT LONG TERM GOAL #1   Title  report pain < 2/10 for improved function and mobility    Status  New    Target Date  03/29/17      PT  LONG TERM GOAL #2   Title  demonstrate proper lifting and biomechanics with ADLs for decreased risk of reinjury    Status  New    Target Date  03/29/17            Plan - 03/16/17 0837    Clinical Impression Statement  Pt reports doing very well x 1 week with minimal symptoms and pt able to easily manage symptoms with exercises and heat.  Recommend follow up in 3 weeks and pt to be seen sooner if needed.  Progressing well at this time.    PT Treatment/Interventions  ADLs/Self Care Home Management;Cryotherapy;Electrical Stimulation;Moist Heat;Ultrasound;Therapeutic exercise;Therapeutic activities;Patient/family education;Manual techniques;Dry needling;Taping    PT Next Visit Plan  plan for d/c if pt doing well; otherwise will need renewal.    Consulted and Agree with Plan of Care  Patient       Patient will benefit from skilled therapeutic intervention in order to improve the following deficits and impairments:  Increased fascial restricitons, Increased muscle spasms, Pain,  Postural dysfunction, Improper body mechanics  Visit Diagnosis: Pain in thoracic spine  Myalgia     Problem List Patient Active Problem List   Diagnosis Date Noted  . History of drug abuse 12/30/2016  . Thoracic radiculopathy due to degenerative joint disease of spine 10/13/2016  . Orthostatic hypotension 05/18/2016  . Hypokalemia 05/17/2016  . Syncope and collapse 05/16/2016  . PCOS (polycystic ovarian syndrome) 02/13/2015  . IUD (intrauterine device) in place 02/13/2015  . Hx of pulmonary embolus 02/13/2015  . Bipolar depression (HCC)       Clarita CraneStephanie F Teri Legacy, PT, DPT 03/16/17 8:39 AM    [facility] [address] [place], [place], [address] Phone: [phone]   Fax:  [phone]  Name: Sheri CollinsCassandra [name] MRN: [id number] Date of Birth: 05/05/1990

## 2017-03-16 NOTE — Patient Instructions (Signed)
Strengthening: Horizontal Abduction - with Interior Rotation (Prone)    Holding _2___ pound weights, raise arms out from sides, pinching shoulder blades. Keep elbows straight, thumbs down. Repeat __10__ times per set. Do __3__ sets per session. Do __1__ sessions per day.   Strengthening: Resisted Extension    Hold tubing in both hands, arm forward. Pull arm back, elbow straight. Repeat __10__ times per set. Do __3__ sets per session. Do __1__ sessions per day.   Scapular Retraction: Rowing (Eccentric) - Arms - Side (Resistance Band)    Hold end of band in each hand. Pull back until elbows are even with trunk. Keep elbows by sides, thumbs up. Slowly release for 3-5 seconds. Use __green___ resistance band. __10_ reps per set, __3_ sets, _3-4__ days per week.          Lumbar Diagonal Rotation: Resisted (Standing 1)    With left side toward anchor, reach up and out to side. Rotate body to midline.  DO NOT BEND FORWARD.  Repeat _10__ times per set. Do __3__ sets per session. Do __1__ sessions per day.

## 2017-03-24 ENCOUNTER — Ambulatory Visit: Payer: BLUE CROSS/BLUE SHIELD | Admitting: Sports Medicine

## 2017-03-24 ENCOUNTER — Encounter: Payer: Self-pay | Admitting: Sports Medicine

## 2017-03-24 VITALS — BP 108/80 | HR 78 | Ht 63.0 in | Wt 177.2 lb

## 2017-03-24 DIAGNOSIS — M9904 Segmental and somatic dysfunction of sacral region: Secondary | ICD-10-CM

## 2017-03-24 DIAGNOSIS — M9902 Segmental and somatic dysfunction of thoracic region: Secondary | ICD-10-CM

## 2017-03-24 DIAGNOSIS — M9908 Segmental and somatic dysfunction of rib cage: Secondary | ICD-10-CM

## 2017-03-24 DIAGNOSIS — M9901 Segmental and somatic dysfunction of cervical region: Secondary | ICD-10-CM | POA: Diagnosis not present

## 2017-03-24 DIAGNOSIS — M4724 Other spondylosis with radiculopathy, thoracic region: Secondary | ICD-10-CM

## 2017-03-24 DIAGNOSIS — M9903 Segmental and somatic dysfunction of lumbar region: Secondary | ICD-10-CM

## 2017-03-24 NOTE — Procedures (Signed)
PROCEDURE NOTE : OSTEOPATHIC MANIPULATION The decision today to treat with Osteopathic Manipulative Therapy (OMT) was based on physical exam findings. Verbal consent was obtained after after explanation of risks, benefits and potential side effects, including acute pain flare, post manipulation soreness and need for repeat treatments.   Additional time was spent discussing the minimal risk of  injury to neurovascular structures for associated Cervical manipulation.  After verbal consent was obtained manipulation was performed as below:  Contraindications to OMT reviewed and include: NONE.             Regions treated: Per examined regions as below and associated billing codes          Techniques used: [employer], MFR, HVLA and ART The patient tolerated the treatment well and reported Improved symptoms following treatment today. Patient was given medications, exercises, stretches and lifestyle modifications per AVS and verbally.     OSTEOPATHIC/STRUCTURAL EXAM FINDINGS:    C2 FRS right  C6 FRS left  T2 -T4 neutral rotated right, side bent left  Posterior rib 6 on the right  Left on left sacral torsion  L5 FRS right

## 2017-03-24 NOTE — Progress Notes (Signed)
Sheri FellsMichael D. Sheri Shinerigby, DO  [facility] [phone]  Sheri [name] - 26 y.o. female MRN [id number]  Date of birth: 12/06/1990   Scribe for today's visit: Sheri ClinchBrandy [name], CMA    SUBJECTIVE:  Sheri CollinsCassandra Sheri [name] is here for Follow-up (thoracic back pain)  Compared to the last office visit, her previously described symptoms are improving, pain in LT shoulder has resolved.  Current symptoms are mild & are radiating to c-spine and LT shoulder She has been going to PT and has gotten relief from neck and shoulder pain with PT and dry needling. She has been doing home therapeutic exercises with no trouble.    ROS Denies night time disturbances. Denies fevers, chills, or night sweats. Denies unexplained weight loss. Denies personal history of cancer. Denies changes in bowel or bladder habits. Denies recent unreported falls. Denies new or worsening dyspnea or wheezing. Denies headaches or dizziness.  Denies numbness, tingling or weakness  In the extremities.  Denies dizziness or presyncopal episodes Denies lower extremity edema    HISTORY & PERTINENT PRIOR DATA:  Prior History reviewed and updated per electronic medical record. Significant history, findings, studies and interim changes include: No additional findings.  reports that she quit smoking about 15 months ago. she has never used smokeless tobacco. Recent Labs    04/27/16 1207  HGBA1C 5.2   No problems updated.   OBJECTIVE:  VS:  HT:5\' 3"  (160 cm)   WT:177 lb 3.2 oz (80.4 kg)  BMI:31.4    BP:108/80  HR:78bpm  TEMP: ( )  RESP:98 %  PHYSICAL EXAM: Constitutional: WDWN, Non-toxic appearing. Psychiatric: Alert & appropriately interactive. Not depressed or anxious appearing. Respiratory: No increased work of breathing. Trachea Midline Eyes: Pupils are equal. EOM intact without nystagmus. No scleral icterus  Spine exam: Overall she has had significant  improvements in the paraspinal muscle spasms.  No reproducible pain with palpation of the thoracic cage.  She has improved thoracic mobility with improved side bending and rotation.  Cervical spine exam is normal other than functional limitations.  No pain Spurling's compression test.  No radicular symptoms.  Low back has no focal midline tenderness.  ASSESSMENT & PLAN:   1. Thoracic radiculopathy due to degenerative joint disease of spine   2. Segmental and somatic dysfunction of cervical region   3. Segmental and somatic dysfunction of thoracic region   4. Segmental and somatic dysfunction of lumbar region   5. Segmental and somatic dysfunction of sacral region   6. Segmental and somatic dysfunction of rib cage    Plan: Overall she is remarkably better.   She does have a follow-up appointment with physical therapy and can consider repeat dry needling as this has been very beneficial for her.   She has a persistent tightness within the left midthoracic region that from a symptom standpoint is significantly better however I anticipate she will continue to have some issues with this region.   Continue with anti-inflammatories intermittently and therapeutic exercises daily.   8-week follow-up for reevaluation and consideration of repeat osteopathic manipulation  No problem-specific Assessment & Plan notes found for this encounter.  ++++++++++++++++++++++++++++++++++++++++++++ Orders:  Orders Placed This Encounter  Procedures  . OSTEOPATHIC MANIPULATION TREATMENT    Meds:  No orders of the defined types were placed in this encounter.   ++++++++++++++++++++++++++++++++++++++++++++ Follow-up: Return in 8 weeks (on 05/19/2017).   Pertinent documentation may be included in additional procedure notes, imaging studies, problem based documentation and  patient instructions. Please see these sections of the encounter for additional information regarding this visit. CMA/ATC served as Neurosurgeonscribe during  this visit. History, Physical, and Plan performed by medical provider. Documentation and orders reviewed and attested to.      Andrena MewsMichael D Rigby, DO    [facility] Phys[facility]

## 2017-04-03 ENCOUNTER — Encounter: Payer: Self-pay | Admitting: Internal Medicine

## 2017-04-05 ENCOUNTER — Encounter: Payer: Self-pay | Admitting: Physical Therapy

## 2017-04-05 ENCOUNTER — Ambulatory Visit (INDEPENDENT_AMBULATORY_CARE_PROVIDER_SITE_OTHER): Payer: BLUE CROSS/BLUE SHIELD | Admitting: Physical Therapy

## 2017-04-05 DIAGNOSIS — M791 Myalgia, unspecified site: Secondary | ICD-10-CM | POA: Diagnosis not present

## 2017-04-05 DIAGNOSIS — M546 Pain in thoracic spine: Secondary | ICD-10-CM

## 2017-04-05 NOTE — Patient Instructions (Signed)
  Knee Fold   Lie on back, legs bent, arms by sides. Exhale, lifting knee to chest. Inhale, returning. Keep abdominals flat, navel to spine. Repeat __10__ times, alternating legs. Do __2__ sessions per day.   Knee Drop   Keep pelvis stable. Without rotating hips, slowly drop knee to side, pause, return to center, bring knee across midline toward opposite hip. Feel obliques engaging. Repeat for ___10_ times each leg.   Heel Slide to Straight   Slide one leg down to straight. Return. Be sure pelvis does not rock forward, tilt, rotate, or tip to side. Do _10__ times. Restabilize pelvis. Repeat with other leg. Do __1-2_ sets, __2_ times per day.   Prone Hip and Knee Extension    Try to lift one leg, keeping knee as straight as possible. Do not lift or turn hips. Hold _2-3___ seconds. Repeat with other leg. Repeat __10__ times. Do __1-2__ sessions per day.  Isometric Hold (Quadruped)   On hands and knees, slowly inhale, and then exhale. Pull navel toward spine and Hold for _5__ seconds. Continue to breathe in and out during hold.  Repeat _10__ times. Do _1-2__ times a day.   Bracing With Arm Raise (Quadruped)   On hands and knees find neutral spine. Tighten pelvic floor and abdominals and hold. Alternately lift arm to shoulder level. Repeat __10_ times. Do _1-2__ times a day.   Bracing With Leg Raise (Quadruped)   On hands and knees find neutral spine. Tighten pelvic floor and abdominals and hold. Alternating legs, straighten and lift to hip level. Repeat _10__ times. Do _1-2__ times a day.   Bracing With Arm / Leg Raise (Quadruped)   On hands and knees find neutral spine. Tighten pelvic floor and abdominals and hold. Alternating, lift arm to shoulder level and opposite leg to hip level. Repeat _10__ times. Do __1-2_ times a day.

## 2017-04-05 NOTE — Telephone Encounter (Signed)
Dr Clent RidgesFry please advise if you feel this can be treated with Abx sent to pharmacy or if she needs to come in to be evaluated.  Pt having productive/non-productive cough, congestion, sore throat, headache and foggy headed. No fever. Mucus is Damone Fancher. No relief with OTC meds. Thanks.   ----- Message -----  From: Jaynie Collinsassandra Grace Craig  Sent: 12/15/20187:39 AM EST  To: Berniece AndreasWanda Panosh, MD Subject: Non-Urgent Medical Question  Hello, I have been fighting what feels like a mini cold for ten days now (Saturday). It was never full blown sick but has been a sore throat, mild congestion and coughing, headaches and generally foggy. I have treated with cold medicine, Benadryl, rest, fluids and nothing seems to be treating it or shaking it. I'm not sure what a sinus infection looks or feels like but that's what comes to mind since it feels so acutely in the front of my face.I was wondering what you would suggest I do next to start feeling better. Thanks!  It is a very Lotus Santillo mucous. And the cough seems to be both I think. Depends on if I'm lying down or walking around. No fever that I know of.

## 2017-04-05 NOTE — Therapy (Signed)
[facility] [address] [place], [place], [address] Phone: [phone]   Fax:  [phone]  Physical Therapy Treatment/Recertification  Patient Details  Name: Sheri [name] MRN: [id number] Date of Birth: [birth year] Referring Provider: Dr. Teresa Coombs   Encounter Date: 04/05/2017  PT End of Session - 04/05/17 0849    Visit Number  6    Number of Visits  12    Date for PT Re-Evaluation  03/29/17    Authorization Type  [employer] $40 copay    Authorization - Number of Visits  26    PT Start Time  0801    PT Stop Time  0843    PT Time Calculation (min)  42 min    Activity Tolerance  Patient tolerated treatment well    Behavior During Therapy  [facility] for tasks assessed/performed       Past Medical History:  Diagnosis Date  . Alcohol addiction ([place])   . Anxiety   . Bipolar depression ([place])    under psych rx   . Depression   . Drug addiction ([place])   . Drug addiction in remission [facility])    heroin  . Enteritis due to Norovirus 05/18/2016  . PCOS (polycystic ovarian syndrome) 02/13/2015  . Pulmonary embolism ([place]) 2011   neg heme evaluation  felt to be from ocps   . Smoker 12/30/2016  . Syncope and collapse 05/16/2016    Past Surgical History:  Procedure Laterality Date  . NO PAST SURGERIES      There were no vitals filed for this visit.  Subjective Assessment - 04/05/17 0803    Subjective  feels much better; wants Lt shoulder DN one more time.  wants to hold on PT and work on her HEP.  pain up to 3/10 over past 1-2 weeks.      Patient Stated Goals  improve pain and strength    Currently in Pain?  No/denies                      [facility] Adult PT Treatment/Exercise - 04/05/17 0001      Lumbar Exercises: Supine   Clam  10 reps    Heel Slides  10 reps    Bent Knee Raise  10 reps      Lumbar Exercises: Prone   Straight Leg Raise  10 reps    Opposite Arm/Leg Raise  Right arm/Left leg;Left arm/Right leg;5 reps       Lumbar Exercises: Quadruped   Single Arm Raise  Right;Left;5 reps    Straight Leg Raise  5 reps    Opposite Arm/Leg Raise  Right arm/Left leg;Left arm/Right leg;5 reps    Other Quadruped Lumbar Exercises  isometric TA hold 5 x 5 sec      Manual Therapy   Manual Therapy  Joint mobilization;Soft tissue mobilization;Myofascial release    Joint Mobilization  PA mobs unilateral and central Gd II/III    Soft tissue mobilization  to L thoracic paraspinals, rhomboids and , B UT    Myofascial Release  to L rhomboids       Trigger Point Dry Needling - 04/05/17 0847    Consent Given?  Yes    Muscles Treated Upper Body  Rhomboids;Subscapularis Lt    Rhomboids Response  Twitch response elicited;Palpable increased muscle length    Subscapularis Response  Twitch response elicited;Palpable increased muscle length           PT Education - 04/05/17 4193  Education provided  Yes    Education Details  HEP, plan of care     Person(s) Educated  Patient    Methods  Explanation;Demonstration;Handout    Comprehension  Verbalized understanding;Returned demonstration          PT Long Term Goals - 04/05/17 0849      PT LONG TERM GOAL #1   Title  report pain < 2/10 for improved function and mobility    Baseline  04/05/17: up to 3/10    Status  On-going    Target Date  05/17/17      PT LONG TERM GOAL #2   Title  demonstrate proper lifting and biomechanics with ADLs for decreased risk of reinjury    Status  Achieved      PT LONG TERM GOAL #3   Title  independent with advanced HEP    Status  New    Target Date  05/17/17            Plan - 04/05/17 0850    Clinical Impression Statement  Pt has met 1 LTG and pain goal improving but not quite to goal at this point.  Pt doing very well and wanting to hold PT at this time.  Scheduled one PT session 3 days before doctor follow up to see if additional DN is warranted.  Anticipate d/c after that visit.    PT Treatment/Interventions   ADLs/Self Care Home Management;Cryotherapy;Electrical Stimulation;Moist Heat;Ultrasound;Therapeutic exercise;Therapeutic activities;Patient/family education;Manual techniques;Dry needling;Taping    PT Next Visit Plan  reassess Lt shoulder and tx as indicated    Consulted and Agree with Plan of Care  Patient;Family member/caregiver    Family Member Consulted  fiance       Patient will benefit from skilled therapeutic intervention in order to improve the following deficits and impairments:  Increased fascial restricitons, Increased muscle spasms, Pain, Postural dysfunction, Improper body mechanics  Visit Diagnosis: Pain in thoracic spine - Plan: PT plan of care cert/re-cert  Myalgia - Plan: PT plan of care cert/re-cert     Problem List Patient Active Problem List   Diagnosis Date Noted  . History of drug abuse 12/30/2016  . Thoracic radiculopathy due to degenerative joint disease of spine 10/13/2016  . Orthostatic hypotension 05/18/2016  . Hypokalemia 05/17/2016  . Syncope and collapse 05/16/2016  . PCOS (polycystic ovarian syndrome) 02/13/2015  . IUD (intrauterine device) in place 02/13/2015  . Hx of pulmonary embolus 02/13/2015  . Bipolar depression ([place])       Laureen Abrahams, PT, DPT 04/05/17 8:53 AM    [place], [place], [address] Phone: [phone]   Fax:  [phone]  Name: Teofila Bowery MRN: [id number] Date of Birth: 1990-06-15

## 2017-04-06 NOTE — Telephone Encounter (Signed)
This could be a sinus infection. I advise her to make an OV for us to check

## 2017-04-08 ENCOUNTER — Ambulatory Visit: Payer: BLUE CROSS/BLUE SHIELD | Admitting: Family Medicine

## 2017-04-08 ENCOUNTER — Encounter: Payer: Self-pay | Admitting: Family Medicine

## 2017-04-08 VITALS — BP 110/60 | HR 104 | Temp 98.5°F | Wt 178.8 lb

## 2017-04-08 DIAGNOSIS — J018 Other acute sinusitis: Secondary | ICD-10-CM | POA: Diagnosis not present

## 2017-04-08 MED ORDER — AZITHROMYCIN 250 MG PO TABS: ORAL_TABLET | ORAL | 0 refills | Status: DC

## 2017-04-08 NOTE — Progress Notes (Signed)
   Subjective:    Patient ID: Sheri CollinsCassandra Grace Craig, female    DOB: 03/18/1991, 26 y.o.   MRN: [id number]  HPI Here for 2 weeks of sinus pressure, blowing green mucus from the nose, and a dry cough. Using Mucinex DM.    Review of Systems  Constitutional: Negative.   HENT: Positive for congestion, postnasal drip and sinus pressure. Negative for ear pain, sinus pain and sore throat.   Eyes: Negative.   Respiratory: Positive for cough.        Objective:   Physical Exam  Constitutional: She appears well-developed and well-nourished.  HENT:  Right Ear: External ear normal.  Left Ear: External ear normal.  Nose: Nose normal.  Mouth/Throat: Oropharynx is clear and moist.  Eyes: Conjunctivae are normal.  Neck: No thyromegaly present.  Pulmonary/Chest: Effort normal and breath sounds normal. No respiratory distress. She has no wheezes. She has no rales.  Lymphadenopathy:    She has no cervical adenopathy.          Assessment & Plan:  Sinusitis, treat with a Zpack.  Gershon CraneStephen Ellie Bryand, MD

## 2017-05-14 ENCOUNTER — Ambulatory Visit: Payer: BLUE CROSS/BLUE SHIELD | Admitting: Physical Therapy

## 2017-05-14 ENCOUNTER — Encounter: Payer: Self-pay | Admitting: Physical Therapy

## 2017-05-14 DIAGNOSIS — M546 Pain in thoracic spine: Secondary | ICD-10-CM | POA: Diagnosis not present

## 2017-05-14 DIAGNOSIS — M791 Myalgia, unspecified site: Secondary | ICD-10-CM | POA: Diagnosis not present

## 2017-05-14 NOTE — Patient Instructions (Signed)
  [email or web address]  Good exercise program to help with posture and core while caring for a new baby.

## 2017-05-14 NOTE — Therapy (Signed)
[place], [place], [address] Phone: [phone]   Fax:  [phone]  Physical Therapy Treatment/Discharge  Patient Details  Name: Sheri [name] MRN: [id number] Date of Birth: 1990/11/01 Referring Provider: Dr. Teresa Coombs   Encounter Date: 05/14/2017  PT End of Session - 05/14/17 0835    Visit Number  7    Authorization Type  [employer] $40 copay    PT Start Time  0800    PT Stop Time  0835    PT Time Calculation (min)  35 min    Activity Tolerance  Patient tolerated treatment well    Behavior During Therapy  [facility] for tasks assessed/performed       Past Medical History:  Diagnosis Date  . Alcohol addiction ([place])   . Anxiety   . Bipolar depression ([place])    under psych rx   . Depression   . Drug addiction ([place])   . Drug addiction in remission [facility])    heroin  . Enteritis due to Norovirus 05/18/2016  . PCOS (polycystic ovarian syndrome) 02/13/2015  . Pulmonary embolism ([place]) 2011   neg heme evaluation  felt to be from ocps   . Smoker 12/30/2016  . Syncope and collapse 05/16/2016    Past Surgical History:  Procedure Laterality Date  . NO PAST SURGERIES      There were no vitals filed for this visit.  Subjective Assessment - 05/14/17 0802    Subjective  reports she's doing well over the past month.  reports pain is significantly less.  using core helps back. pain up to 4/10; come back down to 2/10 with stretches.  ibuprofen needed only once.      Patient Stated Goals  improve pain and strength    Currently in Pain?  No/denies                      [facility] Adult PT Treatment/Exercise - 05/14/17 1740      Self-Care   Lifting  reviewed goals and HEP; pt reports compliance and pain well controlled.  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  Yes    Muscles Treated Upper Body  Rhomboids    Rhomboids Response  Twitch response elicited;Palpable increased muscle length           PT Education - 05/14/17 0835    Education provided  Yes    Education Details  reviewed HEP and provided online resource for posture/core exercise program    Person(s) Educated  Patient    Methods  Explanation    Comprehension  Verbalized understanding          PT Long Term Goals - 05/14/17 0836      PT LONG TERM GOAL #1   Title  report pain < 2/10 for improved function and mobility    Baseline  05/14/17: reports pain up to 4/10 but resolves with stretches     Status  Partially Met      PT LONG TERM GOAL #2   Title  demonstrate  proper lifting and biomechanics with ADLs for decreased risk of reinjury    Status  Achieved      PT LONG TERM GOAL #3   Title  independent with advanced HEP    Status  Achieved            Plan - 05/14/17 0836    Clinical Impression Statement  Pt has progressed well and met all goals.  Ready for d/c today.    PT Treatment/Interventions  ADLs/Self Care Home Management;Cryotherapy;Electrical Stimulation;Moist Heat;Ultrasound;Therapeutic exercise;Therapeutic activities;Patient/family education;Manual techniques;Dry needling;Taping    PT Next Visit Plan  d/c PT today    Consulted and Agree with Plan of Care  Patient       Patient will benefit from skilled therapeutic intervention in order to improve the following deficits and impairments:  Increased fascial restricitons, Increased muscle spasms, Pain, Postural dysfunction, Improper body mechanics  Visit Diagnosis: Myalgia  Pain in thoracic spine     Problem List Patient Active Problem List   Diagnosis Date Noted  . History of drug  abuse 12/30/2016  . Thoracic radiculopathy due to degenerative joint disease of spine 10/13/2016  . Orthostatic hypotension 05/18/2016  . Hypokalemia 05/17/2016  . Syncope and collapse 05/16/2016  . PCOS (polycystic ovarian syndrome) 02/13/2015  . IUD (intrauterine device) in place 02/13/2015  . Hx of pulmonary embolus 02/13/2015  . Bipolar depression ([place])       Laureen Abrahams, PT, DPT 05/14/17 8:38 AM    [facility] [address] [place], [place], [address] Phone: [phone]   Fax:  [phone]  Name: Sheri [name] MRN: [id number] Date of Birth: [birth year]      PHYSICAL THERAPY DISCHARGE SUMMARY  Visits from Start of Care: 7  Current functional level related to goals / functional outcomes: See above   Remaining deficits: Still with occasional pain; manageable with exercise   Education / Equipment: HEP, posture/body mechanics  Plan: Patient agrees to discharge.  Patient goals were met. Patient is being discharged due to meeting the stated rehab goals.  ?????     Laureen Abrahams, PT, DPT 05/14/17 8:38 AM   [place] [address] [place], [place], [address] Phone: [phone]  Fax: [phone]

## 2017-05-17 ENCOUNTER — Ambulatory Visit (INDEPENDENT_AMBULATORY_CARE_PROVIDER_SITE_OTHER): Payer: BLUE CROSS/BLUE SHIELD | Admitting: Sports Medicine

## 2017-05-17 ENCOUNTER — Encounter: Payer: Self-pay | Admitting: Sports Medicine

## 2017-05-17 VITALS — BP 110/78 | HR 79 | Ht 63.0 in | Wt 179.2 lb

## 2017-05-17 DIAGNOSIS — M4724 Other spondylosis with radiculopathy, thoracic region: Secondary | ICD-10-CM

## 2017-05-17 DIAGNOSIS — M9903 Segmental and somatic dysfunction of lumbar region: Secondary | ICD-10-CM | POA: Diagnosis not present

## 2017-05-17 DIAGNOSIS — M9902 Segmental and somatic dysfunction of thoracic region: Secondary | ICD-10-CM | POA: Diagnosis not present

## 2017-05-17 DIAGNOSIS — M9904 Segmental and somatic dysfunction of sacral region: Secondary | ICD-10-CM | POA: Diagnosis not present

## 2017-05-17 DIAGNOSIS — M9908 Segmental and somatic dysfunction of rib cage: Secondary | ICD-10-CM | POA: Diagnosis not present

## 2017-05-17 DIAGNOSIS — M9901 Segmental and somatic dysfunction of cervical region: Secondary | ICD-10-CM | POA: Diagnosis not present

## 2017-05-17 NOTE — Progress Notes (Signed)
Sheri [name] D. Sheri Shinerigby, DO  [name] [place] [phone]  Sheri [name] - 27 y.o. female MRN [id number]  Date of birth: 06/09/1990  Visit Date: 05/17/2017  PCP: Madelin [name], Sheri K, MD   Referred by: Madelin [name], Sheri K, MD   Scribe for today's visit: Sheri ClinchBrandy [name], CMA     SUBJECTIVE:  Sheri [name] is here for Follow-up (thoracic spine pain)  Compared to the last office visit, her previously described symptoms of thoracic spine pain: are improving, less frequent and less intense.  Current symptoms are mild & are radiating to c-spine and LT shoulder; this is occurring a lot less frequently now.  She has been going to PT, just had last session. She is doing home exercises with no trouble. She takes IBU if needed for the pain.    ROS Denies night time disturbances. Denies fevers, chills, or night sweats. Denies unexplained weight loss. Denies personal history of cancer. Denies changes in bowel or bladder habits. Denies recent unreported falls. Denies new or worsening dyspnea or wheezing. Denies headaches or dizziness.  Denies numbness, tingling or weakness  In the extremities.  Denies dizziness or presyncopal episodes Denies lower extremity edema     HISTORY & PERTINENT PRIOR DATA:  Prior History reviewed and updated per electronic medical record.  Significant history, findings, studies and interim changes include:  reports that she quit smoking about 16 months ago. she has never used smokeless tobacco. No results for input(s): HGBA1C, LABURIC, CREATINE in the last 8760 hours. No specialty comments available. No problems updated.  OBJECTIVE:  VS:  HT:5\' 3"  (160 cm)   WT:179 lb 3.2 oz (81.3 kg)  BMI:31.75    BP:110/78  HR:79bpm  TEMP: ( )  RESP:97 %   PHYSICAL EXAM: Constitutional: WDWN, Non-toxic appearing. Psychiatric: Alert & appropriately interactive.  Not depressed or anxious appearing. Respiratory: No  increased work of breathing.  Trachea Midline Eyes: Pupils are equal.  EOM intact without nystagmus.  No scleral icterus  NEUROVASCULAR exam: No clubbing or cyanosis appreciated No significant venous stasis changes Capillary Refill: normal, less than 2 seconds   LOWER Extremities  Pre-tibial edema: No significant pretibial edema Pedal Pulses: Normal & symmetrically palpable Dermatomes intact to light touch Normal strength in all myotomes Reflexes: Normal & symmetric DTRs   Patient walks with a neutral posture.  Upright thoracic spine which is improved compared to the past.  She has mild paraspinal muscle spasms.  Otherwise negative straight leg raise and lower extremity strength is intact as above.  No additional findings.   ASSESSMENT & PLAN:   1. Somatic dysfunction of cervical region   2. Somatic dysfunction of thoracic region   3. Somatic dysfunction of rib cage region   4. Somatic dysfunction of lumbar region   5. Somatic dysfunction of sacral region   6. Thoracic radiculopathy due to degenerative joint disease of spine    PLAN: Repeat osteopathic manipulation performed today.  She is done excellent with physical therapy and home exercises as well as intermittent OMT.  We will plan to have her follow-up in 3 months but she will call us if she needs us sooner.  She should continue with daily therapeutic exercises and if any recurrence can consider repeat osteopathic manipulation and/or dry needling with physical therapy.    No problem-specific Assessment & Plan notes found for this encounter.   ++++++++++++++++++++++++++++++++++++++++++++ Orders & Meds: Orders Placed This Encounter  Procedures  . OSTEOPATHIC MANIPULATION TREATMENT  No orders of the defined types were placed in this encounter.   ++++++++++++++++++++++++++++++++++++++++++++ Follow-up: Return in about 3 months (around 08/15/2017).   Pertinent documentation may be included in additional procedure notes,  imaging studies, problem based documentation and patient instructions. Please see these sections of the encounter for additional information regarding this visit. CMA/ATC served as Neurosurgeon during this visit. History, Physical, and Plan performed by medical provider. Documentation and orders reviewed and attested to.      Sheri Mews, DO    [place] [facility] Physician

## 2017-05-17 NOTE — Procedures (Signed)
PROCEDURE NOTE : OSTEOPATHIC MANIPULATION The decision today to treat with Osteopathic Manipulative Therapy (OMT) was based on physical exam findings. Verbal consent was obtained following a discussion with the patient regarding the of risks, benefits and potential side effects, including an acute pain flare,post manipulation soreness and need for repeat treatments.     Contraindications to OMT reviewed and include: NONE  Manipulation was performed as below: Regions Treated: cervial spine, lumbar spine, thoracic spine, sacrum and ribs Techniques used: HVLA, muscle energy, myofascial release and Articulatory The patient tolerated the treatment well and reported Improved symptoms following treatment today. Patient was given medications, exercises, stretches and lifestyle modifications per AVS and verbally.     OSTEOPATHIC/STRUCTURAL EXAM FINDINGS:    C2 FRS right  T2 extended, rotated left, side bent left  T4 FRS right  Posterior rib 8  L4 FRS left  Left on left sacral torsion

## 2017-05-19 ENCOUNTER — Ambulatory Visit: Payer: BLUE CROSS/BLUE SHIELD | Admitting: Sports Medicine

## 2017-06-14 ENCOUNTER — Encounter: Payer: Self-pay | Admitting: Physician Assistant

## 2017-06-14 NOTE — Progress Notes (Signed)
Cardiology Office Note    Date:  06/16/2017  ID:  Sheri [name], DOB 02/11/1991, MRN [id number] PCP:  Sheri [name], Sheri K, MD  Cardiologist:  Dr. Delton [name]   Chief Complaint: f/u syncope, hyperlipidemia  History of Present Illness:  Sheri [name] is a 27 y.o. female with history of PE in 2011 (associated with OCPs, off anticoagulation), near-syncope and syncope in 04/2016, bipolar disorder/depression, mild hyperlipidemia, prior heroin addiction who presents for follow-up. She has history of near-syncope and syncope in the setting of Norovirus felt secondary to dehydration and hypotension. CT angio 05/16/16 was negative for PE and showed normal heart size. Troponin was unremarkable. 2D Echo 05/17/16 EF 60-65%, mild MR/TR, PASP 36mmHg. Dr. Delton [name] saw her in follow-up for sinus tachycardia/pre-syncope which was felt related to the above. Last labs 04/2016 showed [name] 3.7, Cr 0.71, Hgb 13.5, UDS neg, TSH wnl, LDL 115, trig 164.  She presents back to clinic today overall feeling well. She has been working with her medical team over the last year for some bone spurs in her back. She did have episodes of heartburn this last year but this seems to have improved with changing diet, exercise, and types of activity. She does notice every couple of weeks she will feel a sensation of a skip or flip, lasting just a few seconds. This seems to be primarily during exercise but can also happen at random as well. Mostly thouh she is able to exercise without any issues whatsoever. It is an unusual sensation and catches her attention, but is gone before she can even consider intervention. She believes since her PE event in 2011 that she's been more anxious about bodily sensations. No recurrent syncope. She has not had any dyspnea (which was her main symptom in 2011).  Past Medical History:  Diagnosis Date  . Alcohol addiction (HCC)   . Anxiety   . Bipolar depression (HCC)    under psych rx   . Depression   . Drug  addiction in remission [facility](HCC)    heroin  . Enteritis due to Norovirus 05/18/2016  . Hyperlipidemia   . Mild mitral regurgitation   . Mild tricuspid regurgitation   . Near syncope 05/16/2016   a. felt due to norovirus.  . Obesity   . PCOS (polycystic ovarian syndrome) 02/13/2015  . Pulmonary embolism (HCC) 2011   neg heme evaluation  felt to be from ocps   . Smoker 12/30/2016     Past Surgical History:  Procedure Laterality Date  . NO PAST SURGERIES      Current Medications: Current Meds  Medication Sig  . metFORMIN (GLUCOPHAGE-XR) 500 MG 24 hr tablet TAKE 1 TABLET BY MOUTH EVERY DAY FOR 1 WEEK THEN 1 TABLET TWICE A DAY  . PARAGARD INTRAUTERINE COPPER IUD IUD 1 each by Intrauterine route once.   Current Facility-Administered Medications for the 06/16/17 encounter (Office Visit) with Sheri [name], Sheri Jasso N, PA-C  Medication  . 0.9 %  sodium chloride infusion    Allergies:   Bupropion   Social History   Socioeconomic History  . Marital status: Single    Spouse name: None  . Number of children: None  . Years of education: None  . Highest education level: None  Social Needs  . Financial resource strain: None  . Food insecurity - worry: None  . Food insecurity - inability: None  . Transportation needs - medical: None  . Transportation needs - non-medical: None  Occupational History  . Occupation: nanny  Tobacco  Use  . Smoking status: Former Smoker    Last attempt to quit: 12/20/2015    Years since quitting: 1.4  . Smokeless tobacco: Never Used  Substance and Sexual Activity  . Alcohol use: No    Alcohol/week: 0.0 oz  . Drug use: No  . Sexual activity: None  Other Topics Concern  . 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 Colon cancer Maternal Grandfather   . Hypertension Father   . Irritable bowel syndrome Father     ROS:   Please [name] the history of present illness.  All other systems are reviewed and otherwise negative.    PHYSICAL EXAM:   VS:  BP 118/78 (BP Location: Left Arm, Patient Position: Sitting, Cuff Size: Normal)   Pulse 78   Ht 5\' 3"  (1.6 m)   Wt 177 lb 1.9 oz (80.3 kg)   BMI 31.38 kg/m   BMI: Body mass index is 31.38 kg/m. GEN: Well nourished, well developed WF, in no acute distress  HEENT: normocephalic, atraumatic Neck: no JVD, carotid bruits, or masses Cardiac: RRR; no murmurs, rubs, or gallops, no edema  Respiratory:  clear to auscultation bilaterally, normal work of breathing GI: soft, nontender, nondistended, + BS MS: no deformity or atrophy  Skin: warm and dry, no rash Neuro:  Alert and Oriented x 3, Strength and sensation are intact, follows commands Psych: euthymic mood, full affect  Wt Readings from Last 3 Encounters:  06/16/17 177 lb 1.9 oz (80.3 kg)  05/17/17 179 lb 3.2 oz (81.3 kg)  04/08/17 178 lb 12.8 oz (81.1 kg)      Studies/Labs Reviewed:   EKG:  EKG was ordered today and personally reviewed by me and demonstrates NSR 78bpm, no acute ST-T changes.  Recent Labs: No results found for requested labs within last 8760 hours.   Lipid Panel    Component Value Date/Time   CHOL 196 04/21/2016 0856   TRIG 164.0 (H) 04/21/2016 0856   HDL 47.80 04/21/2016 0856   CHOLHDL 4 04/21/2016 0856   VLDL 32.8 04/21/2016 0856   LDLCALC 115 (H) 04/21/2016 0856   LDLDIRECT 150.0 02/13/2015 1449    Additional studies/ records that were reviewed today include: Summarized above    ASSESSMENT & PLAN:   1. Palpitations - very infrequent, every few weeks, lasting just a few seconds. These seem to come on moreso with exercise but also happen at random (she is largely able to exercise without any symptoms at  all). She also had some issues with heartburn in the last few months but this has improved with lifestyle modification. We discussed ETT to exclude exercise-induced arrhythmias with monitoring, but given the infrequency of events, she prefers to hold off which is reasonable. Warning symptoms reviewed. She agrees to notify our office of any increasing frequency of symptoms or changes. She also sees her PCP in a few weeks and we discussed updating screening bloodwork like electrolytes and thyroid. EKG reassuring today.  2. Hyperlipidemia - last lipid profile 04/2016 with mild increase in LDL and triglycerides but still generally acceptable given  age. We discussed screening today but she wishes to put this in the hands of her PCP instead with whom she has an appt in a few weeks. I asked her to have them please fax a copy to our office for Dr. Lindaann Slough review. 3. History of PE - no recent symptoms to suggest recurrence.  4. Mild MR/TR - no murmurs on exam. Will defer timing of surveillance echoes, if needed, to Dr. Delton [name] (guidelines suggest every 3-5 years, which would be 2021-2023). 5. History of near-syncope/syncope - no recurrence. Suspected due to acute illness from norovirus.  Disposition: F/u with Dr. Delton [name] in 1 year. I told her that if she is doing well at that time without any further symptoms, she can even consider pushing out to 2 years as long as someone is watching her cholesterol and following her regularly otherwise.  Medication Adjustments/Labs and Tests Ordered: Current medicines are reviewed at length with the patient today.  Concerns regarding medicines are outlined above. Medication changes, Labs and Tests ordered today are summarized above and listed in the Patient Instructions accessible in Encounters.   Signed, Sheri Montana, PA-C  06/16/2017 8:17 AM    [facility] [address] [place], [place], [place]  [address] Phone: [phone]; Fax: [phone]

## 2017-06-16 ENCOUNTER — Encounter: Payer: BLUE CROSS/BLUE SHIELD | Admitting: Internal Medicine

## 2017-06-16 ENCOUNTER — Encounter: Payer: Self-pay | Admitting: Physician Assistant

## 2017-06-16 ENCOUNTER — Ambulatory Visit: Payer: BLUE CROSS/BLUE SHIELD | Admitting: Physician Assistant

## 2017-06-16 VITALS — BP 118/78 | HR 78 | Ht 63.0 in | Wt 177.1 lb

## 2017-06-16 DIAGNOSIS — R55 Syncope and collapse: Secondary | ICD-10-CM | POA: Diagnosis not present

## 2017-06-16 DIAGNOSIS — E782 Mixed hyperlipidemia: Secondary | ICD-10-CM | POA: Diagnosis not present

## 2017-06-16 DIAGNOSIS — I340 Nonrheumatic mitral (valve) insufficiency: Secondary | ICD-10-CM | POA: Diagnosis not present

## 2017-06-16 DIAGNOSIS — I071 Rheumatic tricuspid insufficiency: Secondary | ICD-10-CM | POA: Diagnosis not present

## 2017-06-16 DIAGNOSIS — Z86711 Personal history of pulmonary embolism: Secondary | ICD-10-CM

## 2017-06-16 DIAGNOSIS — R002 Palpitations: Secondary | ICD-10-CM | POA: Diagnosis not present

## 2017-06-16 NOTE — Patient Instructions (Addendum)
Medication Instructions:  Your physician recommends that you continue on your current medications as directed. Please refer to the Current Medication list given to you today.  * If you need a refill on your cardiac medications before your next appointment, please call your pharmacy. *  Labwork: None ordered  Testing/Procedures: None ordered  Follow-Up: Your physician wants you to follow-up in: 1 year with Dr. Delton SeeNelson. You will receive a reminder letter in the mail two months in advance. If you don't receive a letter, please call our office to schedule the follow-up appointment.  Thank you for choosing [facility]!!

## 2017-07-23 NOTE — Progress Notes (Signed)
Chief Complaint  Patient presents with  . Annual Exam    No new concerns    HPI: Patient  Sheri [name]  27 y.o. comes in today for Preventive Health Care visit    Has seen  Card for palpitations   stable  Due for labs   Dr. Paulla Fore.  Has helped with manipulation and  Exercise program for the neuro pain    Recovery    Going abstinent ( hx opiate etc)  See below  Has good support   To be married  August  2019   Will need to fly for [place] the [place] and last time  Was very anxietus discussion about    What kind of med to take       No psych pre scriber at this time    Using  Her  Recovery team will consider if needed.   Has gyne  iud no problem s  Health Maintenance  Topic Date Due  . HIV Screening  05/21/2004  . TETANUS/TDAP  05/21/2008  . INFLUENZA VACCINE  11/18/2017  . 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   To finish ? 2021?    and  28 year old of fiancees ( he works  Retail banker and other  No prof day work ) work   And planning a wedding   For [employer]   update  iud     ROS:  GEN/ HEENT: No fever, significant weight changes sweats headaches vision problems hearing changes, CV/ PULM; No chest pain shortness of breath cough, syncope,edema  change in exercise tolerance. GI /GU: No adominal pain, vomiting, change in bowel habits. No blood in the stool. No significant GU symptoms. SKIN/HEME: ,no acute skin rashes suspicious lesions or bleeding. No lymphadenopathy, nodules, masses.  NEURO/ PSYCH:  No neurologic signs such as weakness numbness. No depression  IMM/ Allergy: No unusual infections.  Allergy .   REST of 12 system review negative except as per  HPI   Past Medical History:  Diagnosis Date  . Alcohol addiction ([place])   . Anxiety   . Bipolar depression ([place])    under psych rx   . Depression   . Drug addiction in remission [facility])    heroin  . Enteritis due to Norovirus 05/18/2016  . Hyperlipidemia   . Mild mitral regurgitation   . Mild tricuspid regurgitation   . Near syncope 05/16/2016   a. felt due to norovirus.  . Obesity   . PCOS (polycystic ovarian syndrome) 02/13/2015  . Pulmonary embolism ([place]) 2011   neg heme evaluation  felt to be from ocps   . Smoker 12/30/2016    Past Surgical History:  Procedure Laterality Date  . NO PAST SURGERIES      Family History  Problem Relation Age of Onset  . Colon cancer Maternal Grandfather   . Hypertension Father   . Irritable bowel syndrome Father     Social History   Socioeconomic History  . Marital status: Single    Spouse name: Not on file  .  Number of children: Not on file  . Years of education: Not on file  . Highest education level: Not on file  Occupational History  . Occupation: nanny  Social Needs  . Financial resource strain: Not on file  . Food insecurity:    Worry: Not on file    Inability: Not on file  . Transportation needs:    Medical: Not on file    Non-medical: Not on file  Tobacco Use  . Smoking status: Former Smoker    Last attempt to quit: 12/20/2015    Years since quitting: 1.6  . Smokeless tobacco: Never Used  Substance and Sexual Activity  . Alcohol use: No    Alcohol/week: 0.0 oz  . Drug use: No  . Sexual activity: Not on file  Lifestyle  . Physical activity:    Days per week: Not on file    Minutes per session: Not on file  . Stress: Not on file  Relationships  . Social connections:    Talks on phone: Not on file    Gets together: Not on file    Attends religious service: Not on file    Active member of club or organization: Not on file    Attends meetings of clubs or organizations: Not on file    Relationship status: Not on  file  Other Topics Concern  . 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 cetirizine (ZYRTEC) 10 MG tablet Take 10 mg by mouth daily.    . metFORMIN (GLUCOPHAGE-XR) 500 MG 24 hr tablet TAKE 1 TABLET BY MOUTH EVERY DAY FOR 1 WEEK THEN 1 TABLET TWICE A DAY  6  . PARAGARD INTRAUTERINE COPPER IUD IUD 1 each by Intrauterine route once.     Facility-Administered Medications Prior to Visit  Medication Dose Route Frequency Provider Last Rate Last Dose  . 0.9 %  sodium chloride infusion  500 mL Intravenous Continuous Gatha Mayer, MD         EXAM:  BP 102/78 (BP Location: Right Arm, Patient Position: Sitting, Cuff Size: Normal)   Pulse 83   Temp 97.8 F (36.6 C) (Oral)   Ht _0  (1.6 m)   Wt 178 lb 6.4 oz (80.9 kg)   BMI 31.60 kg/m   Body mass index is 31.6 kg/m. Wt Readings from Last 3 Encounters:  07/26/17 178 lb 6.4 oz (80.9 kg)  06/16/17 177 lb 1.9 oz (80.3 kg)  05/17/17 179 lb 3.2 oz (81.3 kg)    Physical Exam: Vital signs reviewed YQM:GNOI is a well-developed well-nourished alert cooperative    who appearsr stated age in no acute distress.  HEENT: normocephalic atraumatic , Eyes: PERRL EOM's full, conjunctiva clear, Nares: paten,t no deformity discharge or tenderness., Ears: no deformity EAC's clear TMs with normal landmarks. Mouth: clear OP, no lesions, edema.  Moist mucous membranes. Dentition in adequate repair. NECK: supple without masses, thyromegaly or bruits. CHEST/PULM:  Clear to auscultation and percussion breath sounds equal no wheeze , rales or rhonchi. No chest wall deformities or tenderness. Breast: normal by inspection . No  dimpling, discharge,  masses, tenderness or discharge . CV: PMI is nondisplaced, S1 S2 no gallops, murmurs, rubs. Peripheral pulses are full without delay.No JVD .  ABDOMEN: Bowel sounds normal nontender  No guard or rebound, no hepato splenomegal no CVA tenderness.  No hernia. Extremtities:  No clubbing cyanosis or edema, no acute joint swelling or redness no focal atrophy NEURO:  Oriented x3, cranial nerves 3-12 appear to be intact, no obvious focal weakness,gait within normal limits no abnormal reflexes or asymmetrical SKIN: No acute rashes normal turgor, color, no bruising or petechiae. PSYCH: Oriented, good eye contact, no obvious depression anxiety, cognition and judgment appear normal. LN: no cervical axillary inguinal adenopathy    BP Readings from Last 3 Encounters:  07/26/17 102/78  06/16/17 118/78  05/17/17 110/78    tdap discussed   ASSESSMENT AND PLAN:  Discussed the following assessment and plan:  Visit for preventive health examination - Plan: Basic metabolic panel, CBC with Differential/Platelet, Hemoglobin A1c, Hepatic function panel, Lipid panel, TSH  PCOS (polycystic ovarian syndrome) - Plan: Basic metabolic panel, CBC with Differential/Platelet, Hemoglobin A1c, Hepatic function panel, Lipid panel, TSH  Fasting hyperglycemia - Plan: Basic metabolic panel, CBC with Differential/Platelet, Hemoglobin A1c, Hepatic function panel, Lipid panel, TSH  Hyperlipidemia, unspecified hyperlipidemia type - Plan: Basic metabolic panel, CBC with Differential/Platelet, Hemoglobin A1c, Hepatic function panel, Lipid panel, TSH  Anxiety with flying  Need for Tdap vaccination - Plan: Tdap vaccine greater than or equal to 7yo IM due for labs   Disc options  Flight anxiety   For now can do a test dosing of atarax   25- 50 mg   And let us know   metabolic monitoring   Today  Patient Care Team: Elhadj Girton, Standley Brooking, MD as PCP - General (Internal Medicine) Aloha Gell, MD as Consulting Physician (Obstetrics  and Gynecology) Dorothy Spark, MD as Consulting Physician (Cardiology) Patient Instructions  Will notify you  of labs when available.  Can try hydroxyzine    To see if  Helpful for anxiety .  Consider for flying   tdap today   Continue lifestyle intervention healthy eating and exercise .  Health Maintenance, Female Adopting a healthy lifestyle and getting preventive care can go a long way to promote health and wellness. Talk with your health care provider about what schedule of regular examinations is right for you. This is a good chance for you to check in with your provider about disease prevention and staying healthy. In between checkups, there are plenty of things you can do on your own. Experts have done a lot of research about which lifestyle changes and preventive measures are most likely to keep you healthy. Ask your health care provider for more information. Weight and diet Eat a healthy diet  Be sure to include plenty of vegetables, fruits, low-fat dairy products, and lean protein.  Do not eat a lot of foods high in solid fats, added sugars, or salt.  Get regular exercise. This is one of the most important things you can do for your health. ? Most adults should exercise for at least 150 minutes each week. The exercise should increase your heart rate and make you sweat (moderate-intensity exercise). ? Most adults should also do strengthening exercises at least twice a week. This is in addition to the moderate-intensity exercise.  Maintain a healthy weight  Body mass index (BMI) is a measurement that can be used to identify possible weight problems. It estimates body fat based on height  and weight. Your health care provider can help determine your BMI and help you achieve or maintain a healthy weight.  For females 24 years of age and older: ? A BMI below 18.5 is considered underweight. ? A BMI of 18.5 to 24.9 is normal. ? A BMI of 25 to 29.9 is considered  overweight. ? A BMI of 30 and above is considered obese.  Watch levels of cholesterol and blood lipids  You should start having your blood tested for lipids and cholesterol at 28 years of age, then have this test every 5 years.  You may need to have your cholesterol levels checked more often if: ? Your lipid or cholesterol levels are high. ? You are older than 28 years of age. ? You are at high risk for heart disease.  Cancer screening Lung Cancer  Lung cancer screening is recommended for adults 66-42 years old who are at high risk for lung cancer because of a history of smoking.  A yearly low-dose CT scan of the lungs is recommended for people who: ? Currently smoke. ? Have quit within the past 15 years. ? Have at least a 30-pack-year history of smoking. A pack year is smoking an average of one pack of cigarettes a day for 1 year.  Yearly screening should continue until it has been 15 years since you quit.  Yearly screening should stop if you develop a health problem that would prevent you from having lung cancer treatment.  Breast Cancer  Practice breast self-awareness. This means understanding how your breasts normally appear and feel.  It also means doing regular breast self-exams. Let your health care provider know about any changes, no matter how small.  If you are in your 20s or 30s, you should have a clinical breast exam (CBE) by a health care provider every 1-3 years as part of a regular health exam.  If you are 54 or older, have a CBE every year. Also consider having a breast X-ray (mammogram) every year.  If you have a family history of breast cancer, talk to your health care provider about genetic screening.  If you are at high risk for breast cancer, talk to your health care provider about having an MRI and a mammogram every year.  Breast cancer gene (BRCA) assessment is recommended for women who have family members with BRCA-related cancers. BRCA-related cancers  include: ? Breast. ? Ovarian. ? Tubal. ? Peritoneal cancers.  Results of the assessment will determine the need for genetic counseling and BRCA1 and BRCA2 testing.  Cervical Cancer Your health care provider may recommend that you be screened regularly for cancer of the pelvic organs (ovaries, uterus, and vagina). This screening involves a pelvic examination, including checking for microscopic changes to the surface of your cervix (Pap test). You may be encouraged to have this screening done every 3 years, beginning at age 71.  For women ages 92-65, health care providers may recommend pelvic exams and Pap testing every 3 years, or they may recommend the Pap and pelvic exam, combined with testing for human papilloma virus (HPV), every 5 years. Some types of HPV increase your risk of cervical cancer. Testing for HPV may also be done on women of any age with unclear Pap test results.  Other health care providers may not recommend any screening for nonpregnant women who are considered low risk for pelvic cancer and who do not have symptoms. Ask your health care provider if a screening pelvic exam is right for you.  If you have had past treatment for cervical cancer or a condition that could lead to cancer, you need Pap tests and screening for cancer for at least 20 years after your treatment. If Pap tests have been discontinued, your risk factors (such as having a new sexual partner) need to be reassessed to determine if screening should resume. Some women have medical problems that increase the chance of getting cervical cancer. In these cases, your health care provider may recommend more frequent screening and Pap tests.  Colorectal Cancer  This type of cancer can be detected and often prevented.  Routine colorectal cancer screening usually begins at 28 years of age and continues through 28 years of age.  Your health care provider may recommend screening at an earlier age if you have risk factors  for colon cancer.  Your health care provider may also recommend using home test kits to check for hidden blood in the stool.  A small camera at the end of a tube can be used to examine your colon directly (sigmoidoscopy or colonoscopy). This is done to check for the earliest forms of colorectal cancer.  Routine screening usually begins at age 62.  Direct examination of the colon should be repeated every 5-10 years through 28 years of age. However, you may need to be screened more often if early forms of precancerous polyps or small growths are found.  Skin Cancer  Check your skin from head to toe regularly.  Tell your health care provider about any new moles or changes in moles, especially if there is a change in a mole's shape or color.  Also tell your health care provider if you have a mole that is larger than the size of a pencil eraser.  Always use sunscreen. Apply sunscreen liberally and repeatedly throughout the day.  Protect yourself by wearing long sleeves, pants, a wide-brimmed hat, and sunglasses whenever you are outside.  Heart disease, diabetes, and high blood pressure  High blood pressure causes heart disease and increases the risk of stroke. High blood pressure is more likely to develop in: ? People who have blood pressure in the high end of the normal range (130-139/85-89 mm Hg). ? People who are overweight or obese. ? People who are African American.  If you are 24-66 years of age, have your blood pressure checked every 3-5 years. If you are 65 years of age or older, have your blood pressure checked every year. You should have your blood pressure measured twice-once when you are at a hospital or clinic, and once when you are not at a hospital or clinic. Record the average of the two measurements. To check your blood pressure when you are not at a hospital or clinic, you can use: ? An automated blood pressure machine at a pharmacy. ? A home blood pressure monitor.  If  you are between 24 years and 64 years old, ask your health care provider if you should take aspirin to prevent strokes.  Have regular diabetes screenings. This involves taking a blood sample to check your fasting blood sugar level. ? If you are at a normal weight and have a low risk for diabetes, have this test once every three years after 28 years of age. ? If you are overweight and have a high risk for diabetes, consider being tested at a younger age or more often. Preventing infection Hepatitis B  If you have a higher risk for hepatitis B, you should be screened for this virus. You  are considered at high risk for hepatitis B if: ? You were born in a country where hepatitis B is common. Ask your health care provider which countries are considered high risk. ? Your parents were born in a high-risk country, and you have not been immunized against hepatitis B (hepatitis B vaccine). ? You have HIV or AIDS. ? You use needles to inject street drugs. ? You live with someone who has hepatitis B. ? You have had sex with someone who has hepatitis B. ? You get hemodialysis treatment. ? You take certain medicines for conditions, including cancer, organ transplantation, and autoimmune conditions.  Hepatitis C  Blood testing is recommended for: ? Everyone born from [birth year] through 1966. ? Anyone with known risk factors for hepatitis C.  Sexually transmitted infections (STIs)  You should be screened for sexually transmitted infections (STIs) including gonorrhea and chlamydia if: ? You are sexually active and are younger than 28 years of age. ? You are older than 28 years of age and your health care provider tells you that you are at risk for this type of infection. ? Your sexual activity has changed since you were last screened and you are at an increased risk for chlamydia or gonorrhea. Ask your health care provider if you are at risk.  If you do not have HIV, but are at risk, it may be recommended  that you take a prescription medicine daily to prevent HIV infection. This is called pre-exposure prophylaxis (PrEP). You are considered at risk if: ? You are sexually active and do not regularly use condoms or know the HIV status of your partner(s). ? You take drugs by injection. ? You are sexually active with a partner who has HIV.  Talk with your health care provider about whether you are at high risk of being infected with HIV. If you choose to begin PrEP, you should first be tested for HIV. You should then be tested every 3 months for as long as you are taking PrEP. Pregnancy  If you are premenopausal and you may become pregnant, ask your health care provider about preconception counseling.  If you may become pregnant, take 400 to 800 micrograms (mcg) of folic acid every day.  If you want to prevent pregnancy, talk to your health care provider about birth control (contraception). Osteoporosis and menopause  Osteoporosis is a disease in which the bones lose minerals and strength with aging. This can result in serious bone fractures. Your risk for osteoporosis can be identified using a bone density scan.  If you are 13 years of age or older, or if you are at risk for osteoporosis and fractures, ask your health care provider if you should be screened.  Ask your health care provider whether you should take a calcium or vitamin D supplement to lower your risk for osteoporosis.  Menopause may have certain physical symptoms and risks.  Hormone replacement therapy may reduce some of these symptoms and risks. Talk to your health care provider about whether hormone replacement therapy is right for you. Follow these instructions at home:  Schedule regular health, dental, and eye exams.  Stay current with your immunizations.  Do not use any tobacco products including cigarettes, chewing tobacco, or electronic cigarettes.  If you are pregnant, do not drink alcohol.  If you are  breastfeeding, limit how much and how often you drink alcohol.  Limit alcohol intake to no more than 1 drink per day for nonpregnant women. One drink equals 12 ounces  of beer, 5 ounces of wine, or 1 ounces of hard liquor.  Do not use street drugs.  Do not share needles.  Ask your health care provider for help if you need support or information about quitting drugs.  Tell your health care provider if you often feel depressed.  Tell your health care provider if you have ever been abused or do not feel safe at home. This information is not intended to replace advice given to you by your health care provider. Make sure you discuss any questions you have with your health care provider. Document Released: 10/20/2010 Document Revised: 09/12/2015 Document Reviewed: 01/08/2015 Elsevier Interactive Patient Education  2018 [place]. Keyosha Tiedt M.D.

## 2017-07-26 ENCOUNTER — Encounter: Payer: Self-pay | Admitting: Internal Medicine

## 2017-07-26 ENCOUNTER — Ambulatory Visit (INDEPENDENT_AMBULATORY_CARE_PROVIDER_SITE_OTHER): Payer: BLUE CROSS/BLUE SHIELD | Admitting: Internal Medicine

## 2017-07-26 VITALS — BP 102/78 | HR 83 | Temp 97.8°F | Ht 63.0 in | Wt 178.4 lb

## 2017-07-26 DIAGNOSIS — E785 Hyperlipidemia, unspecified: Secondary | ICD-10-CM | POA: Diagnosis not present

## 2017-07-26 DIAGNOSIS — R7301 Impaired fasting glucose: Secondary | ICD-10-CM | POA: Diagnosis not present

## 2017-07-26 DIAGNOSIS — Z23 Encounter for immunization: Secondary | ICD-10-CM | POA: Diagnosis not present

## 2017-07-26 DIAGNOSIS — E282 Polycystic ovarian syndrome: Secondary | ICD-10-CM

## 2017-07-26 DIAGNOSIS — F40243 Fear of flying: Secondary | ICD-10-CM | POA: Diagnosis not present

## 2017-07-26 DIAGNOSIS — Z0000 Encounter for general adult medical examination without abnormal findings: Secondary | ICD-10-CM | POA: Diagnosis not present

## 2017-07-26 LAB — HEPATIC FUNCTION PANEL
ALT: 13 U/L (ref 0–35)
AST: 13 U/L (ref 0–37)
Albumin: 4.4 g/dL (ref 3.5–5.2)
Alkaline Phosphatase: 42 U/L (ref 39–117)
BILIRUBIN TOTAL: 0.3 mg/dL (ref 0.2–1.2)
Bilirubin, Direct: 0.1 mg/dL (ref 0.0–0.3)
Total Protein: 7.6 g/dL (ref 6.0–8.3)

## 2017-07-26 LAB — CBC WITH DIFFERENTIAL/PLATELET
BASOS ABS: 0 10*3/uL (ref 0.0–0.1)
Basophils Relative: 0.5 % (ref 0.0–3.0)
EOS ABS: 0.2 10*3/uL (ref 0.0–0.7)
Eosinophils Relative: 2.7 % (ref 0.0–5.0)
HEMATOCRIT: 39.8 % (ref 36.0–46.0)
Hemoglobin: 13.2 g/dL (ref 12.0–15.0)
LYMPHS ABS: 1.8 10*3/uL (ref 0.7–4.0)
Lymphocytes Relative: 25.5 % (ref 12.0–46.0)
MCHC: 33.2 g/dL (ref 30.0–36.0)
MCV: 87.7 fl (ref 78.0–100.0)
MONO ABS: 0.6 10*3/uL (ref 0.1–1.0)
Monocytes Relative: 8.3 % (ref 3.0–12.0)
NEUTROS ABS: 4.5 10*3/uL (ref 1.4–7.7)
NEUTROS PCT: 63 % (ref 43.0–77.0)
PLATELETS: 351 10*3/uL (ref 150.0–400.0)
RBC: 4.54 Mil/uL (ref 3.87–5.11)
RDW: 13.3 % (ref 11.5–15.5)
WBC: 7.1 10*3/uL (ref 4.0–10.5)

## 2017-07-26 LAB — LIPID PANEL
CHOLESTEROL: 186 mg/dL (ref 0–200)
HDL: 52 mg/dL (ref >39.00)
LDL CALC: 113 mg/dL — AB (ref 0–99)
NonHDL: 133.53
TRIGLYCERIDES: 102 mg/dL (ref 0.0–149.0)
Total CHOL/HDL Ratio: 4
VLDL: 20.4 mg/dL (ref 0.0–40.0)

## 2017-07-26 LAB — BASIC METABOLIC PANEL
BUN: 10 mg/dL (ref 6–23)
CALCIUM: 9.7 mg/dL (ref 8.4–10.5)
CHLORIDE: 104 meq/L (ref 96–112)
CO2: 29 meq/L (ref 19–32)
CREATININE: 0.71 mg/dL (ref 0.40–1.20)
GFR: 104.05 mL/min (ref >60.00)
GLUCOSE: 81 mg/dL (ref 70–99)
Potassium: 4.5 mEq/L (ref 3.5–5.1)
Sodium: 139 mEq/L (ref 135–145)

## 2017-07-26 LAB — HEMOGLOBIN A1C: HEMOGLOBIN A1C: 5.5 % (ref 4.6–6.5)

## 2017-07-26 LAB — TSH: TSH: 1.85 u[IU]/mL (ref 0.35–4.50)

## 2017-07-26 MED ORDER — HYDROXYZINE HCL 25 MG PO TABS: ORAL_TABLET | ORAL | 0 refills | Status: DC

## 2017-07-26 NOTE — Patient Instructions (Addendum)
Will notify you  of labs when available.  Can try hydroxyzine    To see if  Helpful for anxiety .  Consider for flying   tdap today   Continue lifestyle intervention healthy eating and exercise .  Health Maintenance, Female Adopting a healthy lifestyle and getting preventive care can go a long way to promote health and wellness. Talk with your health care provider about what schedule of regular examinations is right for you. This is a good chance for you to check in with your provider about disease prevention and staying healthy. In between checkups, there are plenty of things you can do on your own. Experts have done a lot of research about which lifestyle changes and preventive measures are most likely to keep you healthy. Ask your health care provider for more information. Weight and diet Eat a healthy diet  Be sure to include plenty of vegetables, fruits, low-fat dairy products, and lean protein.  Do not eat a lot of foods high in solid fats, added sugars, or salt.  Get regular exercise. This is one of the most important things you can do for your health. ? Most adults should exercise for at least 150 minutes each week. The exercise should increase your heart rate and make you sweat (moderate-intensity exercise). ? Most adults should also do strengthening exercises at least twice a week. This is in addition to the moderate-intensity exercise.  Maintain a healthy weight  Body mass index (BMI) is a measurement that can be used to identify possible weight problems. It estimates body fat based on height and weight. Your health care provider can help determine your BMI and help you achieve or maintain a healthy weight.  For females 67 years of age and older: ? A BMI below 18.5 is considered underweight. ? A BMI of 18.5 to 24.9 is normal. ? A BMI of 25 to 29.9 is considered overweight. ? A BMI of 30 and above is considered obese.  Watch levels of cholesterol and blood lipids  You  should start having your blood tested for lipids and cholesterol at 28 years of age, then have this test every 5 years.  You may need to have your cholesterol levels checked more often if: ? Your lipid or cholesterol levels are high. ? You are older than 28 years of age. ? You are at high risk for heart disease.  Cancer screening Lung Cancer  Lung cancer screening is recommended for adults 35-70 years old who are at high risk for lung cancer because of a history of smoking.  A yearly low-dose CT scan of the lungs is recommended for people who: ? Currently smoke. ? Have quit within the past 15 years. ? Have at least a 30-pack-year history of smoking. A pack year is smoking an average of one pack of cigarettes a day for 1 year.  Yearly screening should continue until it has been 15 years since you quit.  Yearly screening should stop if you develop a health problem that would prevent you from having lung cancer treatment.  Breast Cancer  Practice breast self-awareness. This means understanding how your breasts normally appear and feel.  It also means doing regular breast self-exams. Let your health care provider know about any changes, no matter how small.  If you are in your 20s or 30s, you should have a clinical breast exam (CBE) by a health care provider every 1-3 years as part of a regular health exam.  If you are 40  or older, have a CBE every year. Also consider having a breast X-ray (mammogram) every year.  If you have a family history of breast cancer, talk to your health care provider about genetic screening.  If you are at high risk for breast cancer, talk to your health care provider about having an MRI and a mammogram every year.  Breast cancer gene (BRCA) assessment is recommended for women who have family members with BRCA-related cancers. BRCA-related cancers include: ? Breast. ? Ovarian. ? Tubal. ? Peritoneal cancers.  Results of the assessment will determine the  need for genetic counseling and BRCA1 and BRCA2 testing.  Cervical Cancer Your health care provider may recommend that you be screened regularly for cancer of the pelvic organs (ovaries, uterus, and vagina). This screening involves a pelvic examination, including checking for microscopic changes to the surface of your cervix (Pap test). You may be encouraged to have this screening done every 3 years, beginning at age 22.  For women ages 38-65, health care providers may recommend pelvic exams and Pap testing every 3 years, or they may recommend the Pap and pelvic exam, combined with testing for human papilloma virus (HPV), every 5 years. Some types of HPV increase your risk of cervical cancer. Testing for HPV may also be done on women of any age with unclear Pap test results.  Other health care providers may not recommend any screening for nonpregnant women who are considered low risk for pelvic cancer and who do not have symptoms. Ask your health care provider if a screening pelvic exam is right for you.  If you have had past treatment for cervical cancer or a condition that could lead to cancer, you need Pap tests and screening for cancer for at least 20 years after your treatment. If Pap tests have been discontinued, your risk factors (such as having a new sexual partner) need to be reassessed to determine if screening should resume. Some women have medical problems that increase the chance of getting cervical cancer. In these cases, your health care provider may recommend more frequent screening and Pap tests.  Colorectal Cancer  This type of cancer can be detected and often prevented.  Routine colorectal cancer screening usually begins at 28 years of age and continues through 28 years of age.  Your health care provider may recommend screening at an earlier age if you have risk factors for colon cancer.  Your health care provider may also recommend using home test kits to check for hidden blood  in the stool.  A small camera at the end of a tube can be used to examine your colon directly (sigmoidoscopy or colonoscopy). This is done to check for the earliest forms of colorectal cancer.  Routine screening usually begins at age 63.  Direct examination of the colon should be repeated every 5-10 years through 28 years of age. However, you may need to be screened more often if early forms of precancerous polyps or small growths are found.  Skin Cancer  Check your skin from head to toe regularly.  Tell your health care provider about any new moles or changes in moles, especially if there is a change in a mole's shape or color.  Also tell your health care provider if you have a mole that is larger than the size of a pencil eraser.  Always use sunscreen. Apply sunscreen liberally and repeatedly throughout the day.  Protect yourself by wearing long sleeves, pants, a wide-brimmed hat, and sunglasses whenever you are  outside.  Heart disease, diabetes, and high blood pressure  High blood pressure causes heart disease and increases the risk of stroke. High blood pressure is more likely to develop in: ? People who have blood pressure in the high end of the normal range (130-139/85-89 mm Hg). ? People who are overweight or obese. ? People who are African American.  If you are 18-39 years of age, have your blood pressure checked every 3-5 years. If you are 40 years of age or older, have your blood pressure checked every year. You should have your blood pressure measured twice-once when you are at a hospital or clinic, and once when you are not at a hospital or clinic. Record the average of the two measurements. To check your blood pressure when you are not at a hospital or clinic, you can use: ? An automated blood pressure machine at a pharmacy. ? A home blood pressure monitor.  If you are between 55 years and 79 years old, ask your health care provider if you should take aspirin to prevent  strokes.  Have regular diabetes screenings. This involves taking a blood sample to check your fasting blood sugar level. ? If you are at a normal weight and have a low risk for diabetes, have this test once every three years after 28 years of age. ? If you are overweight and have a high risk for diabetes, consider being tested at a younger age or more often. Preventing infection Hepatitis B  If you have a higher risk for hepatitis B, you should be screened for this virus. You are considered at high risk for hepatitis B if: ? You were born in a country where hepatitis B is common. Ask your health care provider which countries are considered high risk. ? Your parents were born in a high-risk country, and you have not been immunized against hepatitis B (hepatitis B vaccine). ? You have HIV or AIDS. ? You use needles to inject street drugs. ? You live with someone who has hepatitis B. ? You have had sex with someone who has hepatitis B. ? You get hemodialysis treatment. ? You take certain medicines for conditions, including cancer, organ transplantation, and autoimmune conditions.  Hepatitis C  Blood testing is recommended for: ? Everyone born from 1945 through 1965. ? Anyone with known risk factors for hepatitis C.  Sexually transmitted infections (STIs)  You should be screened for sexually transmitted infections (STIs) including gonorrhea and chlamydia if: ? You are sexually active and are younger than 28 years of age. ? You are older than 28 years of age and your health care provider tells you that you are at risk for this type of infection. ? Your sexual activity has changed since you were last screened and you are at an increased risk for chlamydia or gonorrhea. Ask your health care provider if you are at risk.  If you do not have HIV, but are at risk, it may be recommended that you take a prescription medicine daily to prevent HIV infection. This is called pre-exposure prophylaxis  (PrEP). You are considered at risk if: ? You are sexually active and do not regularly use condoms or know the HIV status of your partner(s). ? You take drugs by injection. ? You are sexually active with a partner who has HIV.  Talk with your health care provider about whether you are at high risk of being infected with HIV. If you choose to begin PrEP, you should first be tested   for HIV. You should then be tested every 3 months for as long as you are taking PrEP. Pregnancy  If you are premenopausal and you may become pregnant, ask your health care provider about preconception counseling.  If you may become pregnant, take 400 to 800 micrograms (mcg) of folic acid every day.  If you want to prevent pregnancy, talk to your health care provider about birth control (contraception). Osteoporosis and menopause  Osteoporosis is a disease in which the bones lose minerals and strength with aging. This can result in serious bone fractures. Your risk for osteoporosis can be identified using a bone density scan.  If you are 7 years of age or older, or if you are at risk for osteoporosis and fractures, ask your health care provider if you should be screened.  Ask your health care provider whether you should take a calcium or vitamin D supplement to lower your risk for osteoporosis.  Menopause may have certain physical symptoms and risks.  Hormone replacement therapy may reduce some of these symptoms and risks. Talk to your health care provider about whether hormone replacement therapy is right for you. Follow these instructions at home:  Schedule regular health, dental, and eye exams.  Stay current with your immunizations.  Do not use any tobacco products including cigarettes, chewing tobacco, or electronic cigarettes.  If you are pregnant, do not drink alcohol.  If you are breastfeeding, limit how much and how often you drink alcohol.  Limit alcohol intake to no more than 1 drink per day for  nonpregnant women. One drink equals 12 ounces of beer, 5 ounces of wine, or 1 ounces of hard liquor.  Do not use street drugs.  Do not share needles.  Ask your health care provider for help if you need support or information about quitting drugs.  Tell your health care provider if you often feel depressed.  Tell your health care provider if you have ever been abused or do not feel safe at home. This information is not intended to replace advice given to you by your health care provider. Make sure you discuss any questions you have with your health care provider. Document Released: 10/20/2010 Document Revised: 09/12/2015 Document Reviewed: 01/08/2015 Elsevier Interactive Patient Education  [employer].

## 2017-08-16 ENCOUNTER — Encounter: Payer: Self-pay | Admitting: Sports Medicine

## 2017-08-16 ENCOUNTER — Ambulatory Visit: Payer: BLUE CROSS/BLUE SHIELD | Admitting: Sports Medicine

## 2017-08-16 VITALS — BP 112/82 | HR 82 | Ht 63.0 in | Wt 179.8 lb

## 2017-08-16 DIAGNOSIS — M9908 Segmental and somatic dysfunction of rib cage: Secondary | ICD-10-CM | POA: Diagnosis not present

## 2017-08-16 DIAGNOSIS — M9903 Segmental and somatic dysfunction of lumbar region: Secondary | ICD-10-CM | POA: Diagnosis not present

## 2017-08-16 DIAGNOSIS — M9902 Segmental and somatic dysfunction of thoracic region: Secondary | ICD-10-CM | POA: Diagnosis not present

## 2017-08-16 DIAGNOSIS — M9904 Segmental and somatic dysfunction of sacral region: Secondary | ICD-10-CM

## 2017-08-16 DIAGNOSIS — M9901 Segmental and somatic dysfunction of cervical region: Secondary | ICD-10-CM

## 2017-08-16 DIAGNOSIS — M546 Pain in thoracic spine: Secondary | ICD-10-CM

## 2017-08-16 DIAGNOSIS — M9905 Segmental and somatic dysfunction of pelvic region: Secondary | ICD-10-CM

## 2017-08-16 NOTE — Progress Notes (Signed)
Sheri [name]. [name] [phone]  Sheri [name] - 27 y.o. female MRN [id number]  Date of birth: 10/13/1990  Visit Date: 08/16/2017  PCP: Madelin Headings, MD   Referred by: Madelin Headings, MD  Scribe for today's visit: Stevenson Clinch, CMA     SUBJECTIVE:  Sheri [name] is here for Follow-up (t-spine pain)  05/17/17: Compared to the last office visit, her previously described symptoms of thoracic spine pain: are improving, less frequent and less intense.  Current symptoms are mild & are radiating to c-spine and LT shoulder; this is occurring a lot less frequently now.  She has been going to PT, just had last session. She is doing home exercises with no trouble. She takes IBU if needed for the pain.   08/16/17: Compared to the last office visit, her previously described symptoms are improving. She still has a couple of areas that are flaring up but she feels that things are improving from initial visit.  Current symptoms are mild & are radiating to L shoulder. At times she will have shooting pain in both legs, normally at night.  She has been taking IBU with some relief. She has been doing HEP with no trouble. She has done PT in the past.   ROS Reports night time disturbances. Denies fevers, chills, or night sweats. Denies unexplained weight loss. Denies personal history of cancer. Denies changes in bowel or bladder habits. Denies recent unreported falls. Denies new or worsening dyspnea or wheezing. Denies headaches or dizziness.  Reports numbness, tingling Denies dizziness or presyncopal episodes Denies lower extremity edema    HISTORY & PERTINENT PRIOR DATA:  Prior History reviewed and updated per electronic medical record.  Significant/pertinent history, findings, studies include:  reports that she quit smoking about 19 months ago. She has never used smokeless tobacco. Recent Labs     07/26/17 1028  HGBA1C 5.5   No specialty comments available. No problems updated.  OBJECTIVE:  VS:  HT:5\' 3"  (160 cm)   WT:179 lb 12.8 oz (81.6 kg)  BMI:31.86    BP:112/82  HR:82bpm  TEMP: ( )  RESP:97 %   PHYSICAL EXAM: Constitutional: WDWN, Non-toxic appearing. Psychiatric: Alert & appropriately interactive.  Not depressed or anxious appearing. Respiratory: No increased work of breathing.  Trachea Midline Eyes: Pupils are equal.  EOM intact without nystagmus.  No scleral icterus  Vascular Exam: warm to touch no edema  upper and lower extremity neuro exam: unremarkable normal strength normal sensation  MSK Exam: Limited thoracic mobility.  Negative straight leg raise.  Upper extremity strength and sensation intact as above   ASSESSMENT & PLAN:   1. Somatic dysfunction of cervical region   2. Pain in thoracic spine   3. Somatic dysfunction of thoracic region   4. Somatic dysfunction of lumbar region   5. Somatic dysfunction of pelvis region   6. Somatic dysfunction of rib cage region   7. Somatic dysfunction of sacral region     PLAN: Discussed the foundation of treatment for this condition is physical therapy and/or daily (5-6 days/week) therapeutic exercises, focusing on core strengthening, coordination, neuromuscular control/reeducation.  Therapeutic exercises prescribed per procedure note.  Osteopathic manipulation was performed today based on physical exam findings.  Please see procedure note for further information including Osteopathic Exam findings  Follow-up: Return in about 3 months (around 11/15/2017).       Please see additional documentation for Objective, Assessment and  Plan sections. Pertinent additional documentation may be included in corresponding procedure notes, imaging studies, problem based documentation and patient instructions. Please see these sections of the encounter for additional information regarding this visit.  CMA/ATC served as  Neurosurgeon during this visit. History, Physical, and Plan performed by medical provider. Documentation and orders reviewed and attested to.      Andrena Mews, DO    [facility]

## 2017-08-16 NOTE — Patient Instructions (Signed)
Please perform the exercise program that we have prepared for you and gone over in detail on a daily basis.  In addition to the handout you were provided you can access your program through: [email or web address]   Your unique program code is: [email or web address]

## 2017-08-16 NOTE — Progress Notes (Signed)
PROCEDURE NOTE : OSTEOPATHIC MANIPULATION The decision today to treat with Osteopathic Manipulative Therapy (OMT) was based on physical exam findings. Verbal consent was obtained following a discussion with the patient regarding the of risks, benefits and potential side effects, including an acute pain flare,post manipulation soreness and need for repeat treatments.     NONE  Manipulation was performed as below: Regions treated: Cervical spine, Ribs, Thoracic spine, Lumbar spine, Pelvis and Sacrum OMT Techniques Used: HVLA, muscle energy and myofascial release  The patient tolerated the treatment well and reported Improved symptoms following treatment today. Patient was given medications, exercises, stretches and lifestyle modifications per AVS and verbally.   OSTEOPATHIC/STRUCTURAL EXAM:   OA - Rotated Right C2 FRS right (Flexed, Rotated & Sidebent) C5 ERS left (Extended, Rotated & Sidebent) T2 - T4 Neutral, Rotated right, Sidebent left T6 FRS right (Flexed, Rotated & Sidebent) L3 FRS right (Flexed, Rotated & Sidebent) Rib 8 Right  Posterior  R on R sacral torsion Left posterior innominant

## 2017-08-16 NOTE — Progress Notes (Signed)
PROCEDURE NOTE: THERAPEUTIC EXERCISES ([id number]) 15 minutes spent for Therapeutic exercises as below and as referenced in the AVS.  This included exercises focusing on stretching, strengthening, with significant focus on eccentric aspects.   Proper technique shown and discussed handout in great detail with ATC.  All questions were discussed and answered.   Long term goals include an improvement in range of motion, strength, endurance as well as avoiding reinjury. Frequency of visits is one time as determined during today's  office visit. Frequency of exercises to be performed is as per handout.  EXERCISES REVIEWED: Thoracic Mobility foam roller and towel stretch

## 2017-10-08 ENCOUNTER — Ambulatory Visit: Payer: BLUE CROSS/BLUE SHIELD | Admitting: Sports Medicine

## 2017-10-08 ENCOUNTER — Encounter: Payer: Self-pay | Admitting: Sports Medicine

## 2017-10-08 VITALS — BP 112/78 | HR 83 | Ht 63.0 in | Wt 174.8 lb

## 2017-10-08 DIAGNOSIS — M9902 Segmental and somatic dysfunction of thoracic region: Secondary | ICD-10-CM | POA: Diagnosis not present

## 2017-10-08 DIAGNOSIS — M9901 Segmental and somatic dysfunction of cervical region: Secondary | ICD-10-CM | POA: Diagnosis not present

## 2017-10-08 DIAGNOSIS — M9903 Segmental and somatic dysfunction of lumbar region: Secondary | ICD-10-CM

## 2017-10-08 DIAGNOSIS — M9908 Segmental and somatic dysfunction of rib cage: Secondary | ICD-10-CM

## 2017-10-08 DIAGNOSIS — M546 Pain in thoracic spine: Secondary | ICD-10-CM

## 2017-10-08 NOTE — Progress Notes (Signed)
  PROCEDURE NOTE : OSTEOPATHIC MANIPULATION The decision today to treat with Osteopathic Manipulative Therapy (OMT) was based on physical exam findings. Verbal consent was obtained following a discussion with the patient regarding the of risks, benefits and potential side effects, including an acute pain flare,post manipulation soreness and need for repeat treatments.     Contraindications to OMT: NONE  Manipulation was performed as below: Regions Treated OMT Techniques Used  Cervical spine Thoracic spine Ribs Lumbar spine HVLA muscle energy myofascial release articulatory facilitated positional release   The patient tolerated the treatment well and reported Improved symptoms following treatment today. Patient was given medications, exercises, stretches and lifestyle modifications per AVS and verbally.   OSTEOPATHIC/STRUCTURAL EXAM:   OA - rotated right C3 Rotated right, side bent left C6 Rotated left, side bent left T3 -8 Neutral, Rotated LEFT, Sidebent RIGHT Rib 6 Left  Posterior L4 FRS right (Flexed, Rotated & Sidebent)

## 2017-10-08 NOTE — Progress Notes (Signed)
Sheri FellsMichael D. Delorise Shinerigby, DO  [facility] [phone]  Sheri [name] - 27 y.o. female MRN [id number]  Date of birth: 02/16/1991  Visit Date: 10/08/2017  PCP: Madelin HeadingsPanosh, Wanda K, MD   Referred by: Madelin HeadingsPanosh, Wanda K, MD  Scribe(s) for today's visit: Christoper FabianMolly Weber, LAT, ATC  SUBJECTIVE:  Sheri CollinsCassandra Sheri [name] is here for Follow-up (neck and t-spine pain) .    05/17/17: Compared to the last office visit, her previously described symptoms of thoracic spine pain: are improving, less frequent and less intense.  Current symptoms are mild & are radiating to c-spine and LT shoulder; this is occurring a lot less frequently now.  She has been going to PT, just had last session. She is doing home exercises with no trouble. She takes IBU if needed for the pain.   08/16/17: Compared to the last office visit, her previously described symptoms are improving. She still has a couple of areas that are flaring up but she feels that things are improving from initial visit.  Current symptoms are mild & are radiating to L shoulder. At times she will have shooting pain in both legs, normally at night.  She has been taking IBU with some relief. She has been doing HEP with no trouble. She has done PT in the past.   10/08/2017: Compared to the last office visit on 08/16/17, her previously described neck and t-spine pain symptoms are worsening w/ the majority of her pain between her scapula and she is having headaches again.  She con't to have N/T into B UEs to her hands. Current symptoms are mod-severe & are radiating to up t-spine to the c-spine. She has been continuing to do her HEP but not as frequently as previously.  She takes IBU prn.  She had OMT at her last visit and feels that this treatment helps w/ her symptoms.  REVIEW OF SYSTEMS: Reports night time disturbances. Denies fevers, chills, or night sweats. Denies unexplained weight loss. Denies personal  history of cancer. Denies changes in bowel or bladder habits. Denies recent unreported falls. Denies new or worsening dyspnea or wheezing. Reports headaches or dizziness.  Reports numbness, tingling or weakness  In the extremities - in B UEs to her hands Denies dizziness or presyncopal episodes Denies lower extremity edema    HISTORY:  Prior history reviewed and updated per electronic medical record.  Social History   Occupational History  . Occupation: nanny  Tobacco Use  . Smoking status: Former Smoker    Last attempt to quit: 12/20/2015    Years since quitting: 2.0  . Smokeless tobacco: Never Used  Substance and Sexual Activity  . Alcohol use: No    Alcohol/week: 0.0 standard drinks  . Drug use: No  . 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  No significant or  subacute osseous abnormalities.    OBJECTIVE:  VS:  HT:5\' 3"  (160 cm)   WT:174 lb 12.8 oz (79.3 kg)  BMI:30.97    BP:112/78  HR:83bpm  TEMP: ( )  RESP:98 %   PHYSICAL EXAM: CONSTITUTIONAL: Well-developed, Well-nourished and In no acute distress PSYCHIATRIC: Alert & appropriately interactive. and Not depressed or anxious appearing. RESPIRATORY: No increased work of breathing and Trachea Midline EYES: Pupils are equal., EOM intact without nystagmus. and No scleral icterus.  VASCULAR EXAM: Warm and well perfused NEURO: unremarkable  MSK Exam: Spine   Well aligned, no significant deformity. No overlying  skin changes. No focal bony tenderness TTP over Left thoracic spine mid-level, no midline tenderness   RANGE OF MOTION & STRENGTH  Limited thoracic mobility.   SPECIALITY TESTING:  Negative straight leg raise bilaterally  Negative VBA testing  Limited cervical and thoracic rotation  Negative straight leg raise, no focal pain with arm squeeze test or brachial plexus squeeze       ASSESSMENT   1. Somatic dysfunction of cervical region   2. Somatic dysfunction of thoracic region   3. Somatic dysfunction of lumbar region   4. Somatic dysfunction of rib cage region   5. Pain in thoracic spine     PLAN:  Pertinent additional documentation may be included in corresponding procedure notes, imaging studies, problem based documentation and patient instructions.  Procedures:  . Osteopathic manipulation was performed today based on physical exam findings.  Please see procedure note for further information including Osteopathic Exam findings  Medications:  No orders of the defined types were placed in this encounter.  Discussion/Instructions: .  Links to [employer] provided today per Patient Instructions.  These exercises were developed by Myles Lipps, DC with a strong emphasis on core neuromuscular reducation and postural realignment through body-weight exercises. . Continue previously prescribed home exercise program.  . Discussed red flag symptoms that warrant earlier emergent evaluation and patient voices understanding. . Activity modifications and the importance of avoiding exacerbating activities (limiting pain to no more than a 4 / 10 during or following activity) recommended and discussed.  Follow-up:  . Return if symptoms worsen or fail to improve.  . If any lack of improvement consider: . further diagnostic evaluation with MRI of the thoracic and/or cervical spine for consideration of thoracic epidural     CMA/ATC served as scribe during this visit.  History, Physical, and Plan performed by medical provider. Documentation and orders reviewed and attested to.      Andrena Mews, DO    [facility]

## 2017-10-08 NOTE — Patient Instructions (Signed)

## 2017-11-15 ENCOUNTER — Ambulatory Visit: Payer: BLUE CROSS/BLUE SHIELD | Admitting: Sports Medicine

## 2017-11-15 ENCOUNTER — Encounter: Payer: Self-pay | Admitting: Sports Medicine

## 2017-11-15 VITALS — BP 110/84 | HR 93 | Ht 63.0 in | Wt 177.4 lb

## 2017-11-15 DIAGNOSIS — M542 Cervicalgia: Secondary | ICD-10-CM | POA: Diagnosis not present

## 2017-11-15 DIAGNOSIS — M9901 Segmental and somatic dysfunction of cervical region: Secondary | ICD-10-CM

## 2017-11-15 DIAGNOSIS — M9902 Segmental and somatic dysfunction of thoracic region: Secondary | ICD-10-CM | POA: Diagnosis not present

## 2017-11-15 DIAGNOSIS — M9905 Segmental and somatic dysfunction of pelvic region: Secondary | ICD-10-CM

## 2017-11-15 DIAGNOSIS — M546 Pain in thoracic spine: Secondary | ICD-10-CM

## 2017-11-15 DIAGNOSIS — M9903 Segmental and somatic dysfunction of lumbar region: Secondary | ICD-10-CM

## 2017-11-15 NOTE — Progress Notes (Signed)
Sheri [name]. She[name] at [facility] [phone]  Sheri [name] - 27 y.o. female MRN [id number]  Date of birth: 1990-10-01  Visit Date: 11/15/2017  PCP: Madelin Headings, MD   Referred by: Madelin Headings, MD  Scribe(s) for today's visit: Stevenson Clinch, CMA  SUBJECTIVE:  Sheri [name] is here for Follow-up (spine and rib pain)   05/17/17: Compared to the last office visit, her previously described symptoms of thoracic spine pain: are improving, less frequent and less intense.  Current symptoms are mild & are radiating to c-spine and LT shoulder; this is occurring a lot less frequently now.  She has been going to PT, just had last session. She is doing home exercises with no trouble. She takes IBU if needed for the pain.   08/16/17: Compared to the last office visit, her previously described symptoms are improving. She still has a couple of areas that are flaring up but she feels that things are improving from initial visit.  Current symptoms are mild & are radiating to L shoulder. At times she will have shooting pain in both legs, normally at night.  She has been taking IBU with some relief. She has been doing HEP with no trouble. She has done PT in the past.   10/08/2017: Compared to the last office visit on 08/16/17, her previously described neck and t-spine pain symptoms are worsening w/ the majority of her pain between her scapula and she is having headaches again.  She con't to have N/T into B UEs to her hands. Current symptoms are mod-severe & are radiating to up t-spine to the c-spine. She has been continuing to do her HEP but not as frequently as previously.  She takes IBU prn.  She had OMT at her last visit and feels that this treatment helps w/ her symptoms.  11/15/2017: Compared to the last office visit, her previously described symptoms show no change. She c/o soreness and stiffness in her neck. She continues to  have numbness in her arms at night that wakes her from sleep.  Current symptoms are mild & are non-radiating She has been doing HEP with no trouble. She takes IBU prn with some relief.    REVIEW OF SYSTEMS: Reports night time disturbances. Denies fevers, chills, or night sweats. Denies unexplained weight loss. Denies personal history of cancer. Denies changes in bowel or bladder habits. Denies recent unreported falls. Denies new or worsening dyspnea or wheezing. Reports headaches or dizziness.  Reports numbness, tingling or weakness  In the extremities.  Denies dizziness or presyncopal episodes Denies lower extremity edema    HISTORY & PERTINENT PRIOR DATA:  Prior History reviewed and updated per electronic medical record.  Significant/pertinent history, findings, studies include:  reports that she quit smoking about 23 months ago. She has never used smokeless tobacco. Recent Labs    07/26/17 1028  HGBA1C 5.5   No specialty comments available. No problems updated.  OBJECTIVE:  VS:  HT:5\' 3"  (160 cm)   WT:177 lb 6.4 oz (80.5 kg)  BMI:31.43    BP:110/84  HR:93bpm  TEMP: ( )  RESP:97 %   PHYSICAL EXAM: Constitutional: WDWN, Non-toxic appearing. Psychiatric: Alert & appropriately interactive.  Not depressed or anxious appearing. Respiratory: No increased work of breathing.  Trachea Midline Eyes: Pupils are equal.  EOM intact without nystagmus.  No scleral icterus  Vascular Exam: warm to touch no edema  upper and lower extremity neuro exam:  unremarkable  MSK Exam: Upper thoracic spine with limited rotational mobility.  Cervical sidebending is restricted Negative neural tension signs upper extremity neuro exam otherwise unremarkable.   PROCEDURES & DATA REVIEWED:  See procedure note  ASSESSMENT & PLAN:   1. Somatic dysfunction of cervical region   2. Neck pain   3. Somatic dysfunction of thoracic region   4. Somatic dysfunction of lumbar region   5. Pain  in thoracic spine   6. Somatic dysfunction of pelvis region     PLAN: Osteopathic manipulation was performed today based on physical exam findings.  Please see procedure note for further information including Osteopathic Exam findings  Has been doing well with manipulations and home therapeutic exercises.  Plan to follow-up with her as scheduled   Follow-up: Return in about 2 weeks (around 11/29/2017) for consideration of repeat Osteopathic Manipulation.      Please see additional documentation for Objective, Assessment and Plan sections. Pertinent additional documentation may be included in corresponding procedure notes, imaging studies, problem based documentation and patient instructions. Please see these sections of the encounter for additional information regarding this visit.  CMA/ATC served as Neurosurgeonscribe during this visit. History, Physical, and Plan performed by medical provider. Documentation and orders reviewed and attested to.      Andrena MewsMichael D Sayuri Rhames, DO    [facility]

## 2017-11-15 NOTE — Progress Notes (Signed)
PROCEDURE NOTE : OSTEOPATHIC MANIPULATION The decision today to treat with Osteopathic Manipulative Therapy (OMT) was based on physical exam findings. Verbal consent was obtained following a discussion with the patient regarding the of risks, benefits and potential side effects, including an acute pain flare,post manipulation soreness and need for repeat treatments.     Contraindications to OMT reviewed and include: NONE  Manipulation was performed as below: Regions treated: Cervical spine, Ribs, Thoracic spine, Lumbar spine and Pelvis OMT Techniques Used: HVLA, muscle energy and myofascial release  The patient tolerated the treatment well and reported Improved symptoms following treatment today. Patient was given medications, exercises, stretches and lifestyle modifications per AVS and verbally.   OSTEOPATHIC/STRUCTURAL EXAM:   C2 through C4 FRS left C5 FRS right T2 extended side bent right T4 through T6 rotated left L3 FRS right Left on left sacral torsion Right anterior innominate   

## 2017-11-22 ENCOUNTER — Encounter: Payer: Self-pay | Admitting: Sports Medicine

## 2017-11-23 ENCOUNTER — Ambulatory Visit: Payer: BLUE CROSS/BLUE SHIELD | Admitting: Sports Medicine

## 2017-11-23 ENCOUNTER — Encounter: Payer: Self-pay | Admitting: Sports Medicine

## 2017-11-23 VITALS — BP 102/70 | HR 94 | Ht 63.0 in | Wt 178.0 lb

## 2017-11-23 DIAGNOSIS — M9902 Segmental and somatic dysfunction of thoracic region: Secondary | ICD-10-CM | POA: Diagnosis not present

## 2017-11-23 DIAGNOSIS — M9901 Segmental and somatic dysfunction of cervical region: Secondary | ICD-10-CM | POA: Diagnosis not present

## 2017-11-23 DIAGNOSIS — M9903 Segmental and somatic dysfunction of lumbar region: Secondary | ICD-10-CM | POA: Diagnosis not present

## 2017-11-23 DIAGNOSIS — M542 Cervicalgia: Secondary | ICD-10-CM

## 2017-11-23 DIAGNOSIS — M9905 Segmental and somatic dysfunction of pelvic region: Secondary | ICD-10-CM | POA: Diagnosis not present

## 2017-11-23 DIAGNOSIS — M9908 Segmental and somatic dysfunction of rib cage: Secondary | ICD-10-CM

## 2017-11-23 DIAGNOSIS — M546 Pain in thoracic spine: Secondary | ICD-10-CM

## 2017-11-23 DIAGNOSIS — M9904 Segmental and somatic dysfunction of sacral region: Secondary | ICD-10-CM

## 2017-11-23 NOTE — Progress Notes (Signed)
PROCEDURE NOTE : OSTEOPATHIC MANIPULATION The decision today to treat with Osteopathic Manipulative Therapy (OMT) was based on physical exam findings. Verbal consent was obtained following a discussion with the patient regarding the of risks, benefits and potential side effects, including an acute pain flare,post manipulation soreness and need for repeat treatments.     Contraindications to OMT: NONE  Manipulation was performed as below: Regions treated: Cervical spine, Ribs, Thoracic spine, Lumbar spine, Pelvis and Sacrum OMT Techniques Used: HVLA, muscle energy, myofascial release and counterstrain  The patient tolerated the treatment well and reported Improved symptoms following treatment today. Patient was given medications, exercises, stretches and lifestyle modifications per AVS and verbally.   OSTEOPATHIC/STRUCTURAL EXAM:   OA rotated right C2 through C4 FRS left C5 FRS right T2 extended side bent right T4 through T6 rotated left L3 FRS right Left on left sacral torsion Right anterior innominate

## 2017-11-23 NOTE — Progress Notes (Signed)
Sheri [name]. [name] at [facility] [phone]  Sheri [name] - 27 y.o. female MRN [id number]  Date of birth: 07/20/1990  Visit Date: 11/23/2017  PCP: Madelin Headings, MD   Referred by: Madelin Headings, MD  Scribe(s) for today's visit: Christoper Fabian, LAT, ATC  SUBJECTIVE:  Sheri [name] is here for Follow-up (spine and rib pain) .    05/17/17: Compared to the last office visit, her previously described symptoms of thoracic spine pain: are improving, less frequent and less intense.  Current symptoms are mild & are radiating to c-spine and LT shoulder; this is occurring a lot less frequently now.  She has been going to PT, just had last session. She is doing home exercises with no trouble. She takes IBU if needed for the pain.   08/16/17: Compared to the last office visit, her previously described symptoms are improving. She still has a couple of areas that are flaring up but she feels that things are improving from initial visit.  Current symptoms are mild & are radiating to L shoulder. At times she will have shooting pain in both legs, normally at night.  She has been taking IBU with some relief. She has been doing HEP with no trouble. She has done PT in the past.   10/08/2017: Compared to the last office visit on 08/16/17, her previously described neck and t-spine pain symptoms are worsening w/ the majority of her pain between her scapula and she is having headaches again.  She con't to have N/T into B UEs to her hands. Current symptoms are mod-severe & are radiating to up t-spine to the c-spine. She has been continuing to do her HEP but not as frequently as previously.  She takes IBU prn.  She had OMT at her last visit and feels that this treatment helps w/ her symptoms.  11/15/2017: Compared to the last office visit, her previously described symptoms show no change. She c/o soreness and stiffness in her neck. She  continues to have numbness in her arms at night that wakes her from sleep.  Current symptoms are mild & are non-radiating She has been doing HEP with no trouble. She takes IBU prn with some relief.   11/23/2017: Compared to the last office visit, her previously described symptoms show no change. She continues to have numbness in both arms at night which wakes her from sleep. She has been having HA which seem to be worse than they were before. She denies visual changes.  Since her last visit she reports that pain has been worse in her neck and radiates to her jaw line. Her neck "just feels like it needs to pop".  Current symptoms are moderate. She has been doing HEP with no trouble. She takes IBU prn.   REVIEW OF SYSTEMS: Denies night time disturbances. Denies fevers, chills, or night sweats. Denies unexplained weight loss. Denies personal history of cancer. Denies changes in bowel or bladder habits. Denies recent unreported falls. Denies new or worsening dyspnea or wheezing. Reports headaches or dizziness.  Reports numbness, tingling or weakness  In the extremities - in B UEs to her hands Denies dizziness or presyncopal episodes Denies lower extremity edema    HISTORY:  Prior history reviewed and updated per electronic medical record.  Social History   Occupational History  . Occupation: nanny  Tobacco Use  . Smoking status: Former Smoker    Last attempt to quit: 12/20/2015  Years since quitting: 2.0  . Smokeless tobacco: Never Used  Substance and Sexual Activity  . Alcohol use: No    Alcohol/week: 0.0 standard drinks  . Drug use: No  . 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  No significant or subacute osseous abnormalities.    OBJECTIVE:  VS:  HT:5\' 3"  (160 cm)   WT:178 lb (80.7 kg)  BMI:31.54    BP:102/70  HR:94bpm  TEMP: ( )  RESP:99 %   PHYSICAL EXAM: CONSTITUTIONAL: Well-developed, Well-nourished and In no acute distress PSYCHIATRIC: Alert & appropriately interactive. and Not depressed or anxious appearing. RESPIRATORY: No increased work of breathing and Trachea Midline EYES: Pupils are equal., EOM intact without nystagmus. and No scleral icterus.  VASCULAR EXAM: Warm and well perfused NEURO: unremarkable  MSK Exam: Spine   Well aligned, no significant deformity. No overlying skin changes. No focal bony tenderness TTP over Left thoracic spine mid-level, no midline tenderness   RANGE OF MOTION & STRENGTH  Limited thoracic mobility.   SPECIALITY TESTING:  Negative straight leg raise bilaterally  Negative VBA testing         ASSESSMENT   1. Neck pain   2. Somatic dysfunction of cervical region   3. Somatic dysfunction of thoracic region   4. Somatic dysfunction of lumbar region   5. Pain in thoracic spine   6. Somatic dysfunction of pelvis region   7. Somatic dysfunction of rib cage region   8. Somatic dysfunction of sacral region     PLAN:  Pertinent additional documentation may be included in corresponding procedure notes, imaging studies, problem based documentation and patient instructions.  Procedures:  . Osteopathic manipulation was performed today based on physical exam findings.  Please see procedure note for further information including Osteopathic Exam findings  Medications:  No orders of the defined types were placed in this encounter.  Discussion/Instructions: No problem-specific Assessment & Plan notes  found for this encounter.  . Continue previously prescribed home exercise program.  . Discussed red flag symptoms that warrant earlier emergent evaluation and patient voices understanding. . Activity modifications and the importance of avoiding exacerbating activities (limiting pain to no more than a 4 / 10 during or following activity) recommended and discussed.  Follow-up:  . Return in about 3 weeks (around 12/14/2017).   . At follow up will plan to consider: repeat osteopathic manipulation     CMA/ATC served as scribe during this visit. History, Physical, and Plan performed by medical provider. Documentation and orders reviewed and attested to.      Andrena MewsMichael D Rigby, DO    [facility] Physician

## 2017-12-13 ENCOUNTER — Encounter: Payer: Self-pay | Admitting: Sports Medicine

## 2017-12-13 ENCOUNTER — Ambulatory Visit: Payer: BLUE CROSS/BLUE SHIELD | Admitting: Sports Medicine

## 2017-12-13 VITALS — BP 106/70 | HR 94 | Ht 63.0 in | Wt 179.4 lb

## 2017-12-13 DIAGNOSIS — M9904 Segmental and somatic dysfunction of sacral region: Secondary | ICD-10-CM

## 2017-12-13 DIAGNOSIS — M9902 Segmental and somatic dysfunction of thoracic region: Secondary | ICD-10-CM

## 2017-12-13 DIAGNOSIS — M9905 Segmental and somatic dysfunction of pelvic region: Secondary | ICD-10-CM | POA: Diagnosis not present

## 2017-12-13 DIAGNOSIS — M9908 Segmental and somatic dysfunction of rib cage: Secondary | ICD-10-CM

## 2017-12-13 DIAGNOSIS — M9903 Segmental and somatic dysfunction of lumbar region: Secondary | ICD-10-CM | POA: Diagnosis not present

## 2017-12-13 DIAGNOSIS — M9901 Segmental and somatic dysfunction of cervical region: Secondary | ICD-10-CM

## 2017-12-13 DIAGNOSIS — M546 Pain in thoracic spine: Secondary | ICD-10-CM

## 2017-12-13 DIAGNOSIS — M9907 Segmental and somatic dysfunction of upper extremity: Secondary | ICD-10-CM

## 2017-12-13 NOTE — Progress Notes (Signed)
PROCEDURE NOTE : OSTEOPATHIC MANIPULATION The decision today to treat with Osteopathic Manipulative Therapy (OMT) was based on physical exam findings. Verbal consent was obtained following a discussion with the patient regarding the of risks, benefits and potential side effects, including an acute pain flare,post manipulation soreness and need for repeat treatments.     Contraindications to OMT: NONE  Manipulation was performed as below: Regions treated: Cervical spine, Thoracic spine, Ribs, Lumbar spine, Pelvis, Sacrum and Upper extremities OMT Techniques Used: HVLA, muscle energy, myofascial release, articulatory and facilitated positional release  The patient tolerated the treatment well and reported Improved symptoms following treatment today. Patient was given medications, exercises, stretches and lifestyle modifications per AVS and verbally.   OSTEOPATHIC/STRUCTURAL EXAM:   OA - rotated right C2 FRS right (Flexed, Rotated & Sidebent) C7 ERS right (Extended, Rotated & Sidebent) T4 -6 Neutral, Rotated RIGHT, Sidebent LEFT T10 ERS right (Extended, Rotated & Sidebent) Rib 8 Left  Posterior L4 FRS right (Flexed, Rotated & Sidebent) Right psoas spasm Right anterior innonimate L on L sacral torsion Early rotated glenohumeral joint with anterior scapula and small amount of subscap bursitis.

## 2017-12-13 NOTE — Progress Notes (Signed)
Veverly FellsMichael D. Delorise Shinerigby, DO  [facility] [phone]  [name] - 27 y.o. female MRN [id number]  Date of birth: 12/28/1990  Visit Date: 12/13/2017  PCP: Madelin HeadingsPanosh, Wanda K, MD   Referred by: Madelin HeadingsPanosh, Wanda K, MD  Scribe(s) for today's visit: Christoper FabianMolly Weber, LAT, ATC  SUBJECTIVE:  Sheri CollinsCassandra Sheri [name] is here for Follow-up (neck and t-spine pain) .    05/17/17: Compared to the last office visit, her previously described symptoms of thoracic spine pain: are improving, less frequent and less intense.  Current symptoms are mild & are radiating to c-spine and LT shoulder; this is occurring a lot less frequently now.  She has been going to PT, just had last session. She is doing home exercises with no trouble. She takes IBU if needed for the pain.   08/16/17: Compared to the last office visit, her previously described symptoms are improving. She still has a couple of areas that are flaring up but she feels that things are improving from initial visit.  Current symptoms are mild & are radiating to L shoulder. At times she will have shooting pain in both legs, normally at night.  She has been taking IBU with some relief. She has been doing HEP with no trouble. She has done PT in the past.   10/08/2017: Compared to the last office visit on 08/16/17, her previously described neck and t-spine pain symptoms are worsening w/ the majority of her pain between her scapula and she is having headaches again.  She con't to have N/T into B UEs to her hands. Current symptoms are mod-severe & are radiating to up t-spine to the c-spine. She has been continuing to do her HEP but not as frequently as previously.  She takes IBU prn.  She had OMT at her last visit and feels that this treatment helps w/ her symptoms.  11/15/2017: Compared to the last office visit, her previously described symptoms show no change. She c/o soreness and stiffness in her neck. She  continues to have numbness in her arms at night that wakes her from sleep.  Current symptoms are mild & are non-radiating She has been doing HEP with no trouble. She takes IBU prn with some relief.   11/23/2017: Compared to the last office visit, her previously described symptoms show no change. She continues to have numbness in both arms at night which wakes her from sleep. She has been having HA which seem to be worse than they were before. She denies visual changes.  Since her last visit she reports that pain has been worse in her neck and radiates to her jaw line. Her neck "just feels like it needs to pop".  Current symptoms are moderate. She has been doing HEP with no trouble. She takes IBU prn.    12/13/2017: Compared to the last office visit on 11/23/17, her previously described neck and t-spine pain symptoms are improving w/ decreased pain.  She also notes decreased incidence of N/T in her B UEs at night.  She also notes that her headaches have improved as well. Current symptoms are mild & are nonradiating She has been doing her HEP and taking IBU prn.  She has been getting OMT at her last few visits which she has responded well to.   REVIEW OF SYSTEMS: Denies night time disturbances. Denies fevers, chills, or night sweats. Denies unexplained weight loss. Denies personal history of cancer. Denies changes in bowel or bladder habits.  Denies recent unreported falls. Denies new or worsening dyspnea or wheezing. Reports headaches or dizziness.  Reports numbness, tingling or weakness  In the extremities - in the B UEs Denies dizziness or presyncopal episodes Denies lower extremity edema     HISTORY & PERTINENT PRIOR DATA:  Significant/pertinent history, findings, studies include:  reports that she quit smoking about 1 years ago. She has never used smokeless tobacco. Recent Labs    07/26/17 1028  HGBA1C 5.5   No specialty comments available. No problems updated.  Otherwise  prior history reviewed and updated per electronic medical record.    OBJECTIVE:  VS:  HT:5\' 3"  (160 cm)   WT:179 lb 6.4 oz (81.4 kg)  BMI:31.79    BP:106/70  HR:94bpm  TEMP: ( )  RESP:97 %   PHYSICAL EXAM: CONSTITUTIONAL: Well-developed, Well-nourished and In no acute distress Alert & appropriately interactive. and Not depressed or anxious appearing. RESPIRATORY: No increased work of breathing and Trachea Midline EYES: Pupils are equal., EOM intact without nystagmus. and No scleral icterus.  Upper extremities: Warm and well perfused Edema: No significant swelling or edema NEURO: unremarkable  MSK Exam: Lower extremity strength is intact.  She has left anterior carriage of her shoulder.  Full overhead range of motion of her upper extremities.  Somewhat limited thoracic side bending to the right initially but this does improve with osteopathic manipulation.  Negative neural tension signs.  PROCEDURES & DATA REVIEWED:  . Osteopathic manipulation was performed today based on physical exam findings.  Please see procedure note for further information including Osteopathic Exam findings  ASSESSMENT   1. Pain in thoracic spine   2. Somatic dysfunction of cervical region   3. Somatic dysfunction of thoracic region   4. Somatic dysfunction of lumbar region   5. Somatic dysfunction of rib cage region   6. Somatic dysfunction of pelvis region   7. Somatic dysfunction of sacral region   8. Somatic dysfunction of upper extremity     PLAN:   Continue your home exercise program    . She is doing quite well with osteopathic manipulation and reports that she ultimately achieved her goal of being pain-free while getting married. Marland Kitchen Ultimately we will have her continue with home therapeutic exercises and follow-up as needed. . She does have a small amount of scapular bursitis/small amount of crepitation today and recommend over-the-counter anti-inflammatories.  If any lack of improvement can  consider advancing her home therapeutic exercise program. . Please see procedure section and notes. No problem-specific Assessment & Plan notes found for this encounter.   Follow-up: Return in about 8 weeks (around 02/07/2018) for consideration of repeat Osteopathic Manipulation.      Please see additional documentation for Objective, Assessment and Plan sections. Pertinent additional documentation may be included in corresponding procedure notes, imaging studies, problem based documentation and patient instructions. Please see these sections of the encounter for additional information regarding this visit.  CMA/ATC served as Neurosurgeon during this visit. History, Physical, and Plan performed by medical provider. Documentation and orders reviewed and attested to.      Andrena Mews, DO    [facility]

## 2018-01-02 ENCOUNTER — Encounter: Payer: Self-pay | Admitting: Sports Medicine

## 2018-01-04 ENCOUNTER — Encounter: Payer: Self-pay | Admitting: Sports Medicine

## 2018-01-10 ENCOUNTER — Encounter: Payer: Self-pay | Admitting: Sports Medicine

## 2018-01-11 ENCOUNTER — Ambulatory Visit: Payer: BLUE CROSS/BLUE SHIELD | Admitting: Sports Medicine

## 2018-01-11 ENCOUNTER — Ambulatory Visit: Payer: Self-pay

## 2018-01-11 ENCOUNTER — Encounter: Payer: Self-pay | Admitting: Sports Medicine

## 2018-01-11 ENCOUNTER — Ambulatory Visit (INDEPENDENT_AMBULATORY_CARE_PROVIDER_SITE_OTHER): Payer: BLUE CROSS/BLUE SHIELD

## 2018-01-11 VITALS — BP 124/84 | HR 101 | Ht 63.0 in | Wt 179.8 lb

## 2018-01-11 DIAGNOSIS — M25512 Pain in left shoulder: Secondary | ICD-10-CM | POA: Diagnosis not present

## 2018-01-11 DIAGNOSIS — M19019 Primary osteoarthritis, unspecified shoulder: Secondary | ICD-10-CM

## 2018-01-11 DIAGNOSIS — G8929 Other chronic pain: Secondary | ICD-10-CM | POA: Diagnosis not present

## 2018-01-11 NOTE — Progress Notes (Signed)
Sheri [name]. Sheri [name] - 27 y.o. female MRN [id number]  Date of birth: August 08, 1990  Visit Date: 01/11/2018  PCP: Madelin Headings, MD   Referred by: Madelin Headings, MD   Scribe(s) for today's visit: Stevenson Clinch, CMA  SUBJECTIVE:  Sheri [name] is here for L shoulder pain   HPI: Her L shoulder pain symptoms INITIALLY: Began 11/2017 and MOI is unknown. She denies past injury to the L shoulder.  Described as constant mild aching with moderate-severe sharp pain with movement. Pain radiates down the arm and into the 3-5 fingers. She denies n/t in the arm, hand, or fingers.  Worsened with abduction, driving, reaching back.  Improved with rest, still has pain but more periscapular.  Additional associated symptoms include: She notes clicking, popping, and catching in the shoulder. She reports neck pain (receives OMT for this). She reports night disturbance, pain more severe when lying on the L arm. She is R hand dominant.     At this time symptoms are worsening compared to onset, started to flare up again this past Friday.  She has been icing the shoulder with some relief. She has tried taking IBU with some relief. She has been trying to rest her arm as much as she is able.     REVIEW OF SYSTEMS: Reports night time disturbances. Denies fevers, chills, or night sweats. Denies unexplained weight loss. Denies personal history of cancer. Denies changes in bowel or bladder habits. Denies recent unreported falls. Denies new or worsening dyspnea or wheezing. Denies headaches or dizziness.  Denies numbness, tingling or weakness  In the extremities.  Denies dizziness or presyncopal episodes Denies lower extremity edema    HISTORY:  Prior history reviewed and updated per electronic medical record.  Social History   Occupational History  . Occupation: nanny  Tobacco Use  .  Smoking status: Former Smoker    Last attempt to quit: 12/20/2015    Years since quitting: 2.1  . Smokeless tobacco: Never Used  Substance and Sexual Activity  . Alcohol use: No    Alcohol/week: 0.0 standard drinks  . Drug use: No  . 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 01/11/2018:  o X-ray left shoulder: Negative o Ultrasound guided injection of the left AC joint .   OBJECTIVE:  VS:  HT:5\' 3"  (160 cm)   WT:179 lb 12.8 oz (81.6 kg)  BMI:31.86    BP:124/84  HR:(!) 101bpm  TEMP: ( )  RESP:95 %   PHYSICAL EXAM: CONSTITUTIONAL: Well-developed, Well-nourished and In no acute distress PSYCHIATRIC: Alert & appropriately interactive. and Not depressed or anxious appearing. RESPIRATORY: No increased work of breathing and Trachea Midline EYES: Pupils are equal., EOM intact without nystagmus. and No scleral icterus.  VASCULAR EXAM: Warm and well perfused NEURO: unremarkable  MSK Exam: Left shoulder  Well aligned, no significant deformity. No overlying skin changes. TTP over Left  AC joint mildly.  Generalized periscapular pain.   RANGE OF MOTION & STRENGTH  Full overhead range of motion of the shoulder with some weakness with terminal extension and abduction.   SPECIALITY TESTING:  Crepitation with axial loading circumduction but this is mild.  Normal empty can test with small amount of pain with speeds and worst pain with O'Brien's testing.  Negative Hawkins and Neer's.  Upper extremity strength is intact to rotator cuff testing    ASSESSMENT   1. Acute pain of left shoulder   2. Chronic left shoulder pain     3. AC joint arthropathy     PLAN:  Pertinent additional documentation may be included in corresponding procedure notes, imaging studies, problem based documentation and patient instructions.  Procedures:  . US Guided Injection per procedure note  Medications:  No orders of the defined types were placed in this encounter.  Discussion/Instructions: No problem-specific Assessment & Plan notes found for this encounter.  . Some AC joint pain with underlying scapular dyskinesis.  Can consider further diagnostic evaluation with MR arthrogram if persistent pain. . Discussed red flag symptoms that warrant earlier emergent evaluation and patient voices understanding. . Activity modifications and the importance of avoiding exacerbating activities (limiting pain to no more than a 4 / 10 during or following activity) recommended and discussed.  Follow-up:  . No follow-ups on file.   . If any lack of improvement consider: further diagnostic evaluation with MR arthrogram  . At follow up will plan to consider: repeat osteopathic manipulation     CMA/ATC served as scribe during this visit. History, Physical, and Plan performed by medical provider. Documentation and orders reviewed and attested to.      Andrena MewsMichael D Rigby, DO    [facility]

## 2018-01-11 NOTE — Patient Instructions (Addendum)

## 2018-01-11 NOTE — Procedures (Signed)
PROCEDURE NOTE:  Ultrasound Guided: Injection: Left shoulder, AC joint Images were obtained and interpreted by myself, Gaspar BiddingMichael Kale Dols, DO  Images have been saved and stored to PACS system. Images obtained on: GE S7 Ultrasound machine    ULTRASOUND FINDINGS:  Small amount of hypoechoic change and positive mushroom sign  DESCRIPTION OF PROCEDURE:  The patient's clinical condition is marked by substantial pain and/or significant functional disability. Other conservative therapy has not provided relief, is contraindicated, or not appropriate. There is a reasonable likelihood that injection will significantly improve the patient's pain and/or functional impairment.   After discussing the risks, benefits and expected outcomes of the injection and all questions were reviewed and answered, the patient wished to undergo the above named procedure.  Verbal consent was obtained.  The ultrasound was used to identify the target structure and adjacent neurovascular structures. The skin was then prepped in sterile fashion and the target structure was injected under direct visualization using sterile technique as below:  Single injection performed as below: PREP: Alcohol and Ethel Chloride APPROACH:direct, single injection, 25g 1.5 in. INJECTATE: 1 cc 0.5% Marcaine and 1 cc 40mg /mL DepoMedrol ASPIRATE: None DRESSING: Band-Aid  Post procedural instructions including recommending icing and warning signs for infection were reviewed.    This procedure was well tolerated and there were no complications.   IMPRESSION: Succesful Ultrasound Guided: Injection

## 2018-01-30 ENCOUNTER — Encounter: Payer: Self-pay | Admitting: Sports Medicine

## 2018-02-15 ENCOUNTER — Encounter: Payer: Self-pay | Admitting: Sports Medicine

## 2018-02-15 ENCOUNTER — Ambulatory Visit: Payer: BLUE CROSS/BLUE SHIELD | Admitting: Sports Medicine

## 2018-02-15 VITALS — BP 110/80 | HR 75 | Ht 63.0 in | Wt 178.8 lb

## 2018-02-15 DIAGNOSIS — M25512 Pain in left shoulder: Secondary | ICD-10-CM | POA: Diagnosis not present

## 2018-02-15 DIAGNOSIS — G8929 Other chronic pain: Secondary | ICD-10-CM

## 2018-02-15 DIAGNOSIS — M9908 Segmental and somatic dysfunction of rib cage: Secondary | ICD-10-CM | POA: Diagnosis not present

## 2018-02-15 DIAGNOSIS — M19019 Primary osteoarthritis, unspecified shoulder: Secondary | ICD-10-CM

## 2018-02-15 DIAGNOSIS — M546 Pain in thoracic spine: Secondary | ICD-10-CM

## 2018-02-15 DIAGNOSIS — M9902 Segmental and somatic dysfunction of thoracic region: Secondary | ICD-10-CM

## 2018-02-15 DIAGNOSIS — G540 Brachial plexus disorders: Secondary | ICD-10-CM

## 2018-02-15 DIAGNOSIS — M9901 Segmental and somatic dysfunction of cervical region: Secondary | ICD-10-CM

## 2018-02-15 HISTORY — DX: Brachial plexus disorders: G54.0

## 2018-02-15 MED ORDER — DICLOFENAC SODIUM 2 % TD SOLN: 1.0000 "application " | Freq: Two times a day (BID) | TRANSDERMAL | 0 refills | Status: AC

## 2018-02-15 MED ORDER — DICLOFENAC SODIUM 2 % TD SOLN: 1.0000 "application " | Freq: Two times a day (BID) | TRANSDERMAL | 2 refills | Status: DC

## 2018-02-15 NOTE — Progress Notes (Signed)
Sheri [name]. Sheri [name] [facility] at [facility] [phone]  Sheri [name] - 27 y.o. female MRN [id number]  Date of birth: June 26, 1990  Visit Date: 02/15/2018  PCP: Madelin Headings, MD   Referred by: Madelin Headings, MD   Scribe(s) for today's visit: Stevenson Clinch, CMA  SUBJECTIVE:  Sheri [name] is here for Follow-up (neck, back, L shoulder pain)   HPI: 01/11/2018: Her L shoulder pain symptoms INITIALLY: Began 11/2017 and MOI is unknown. She denies past injury to the L shoulder.  Described as constant mild aching with moderate-severe sharp pain with movement. Pain radiates down the arm and into the 3-5 fingers. She denies n/t in the arm, hand, or fingers.  Worsened with abduction, driving, reaching back.  Improved with rest, still has pain but more periscapular.  Additional associated symptoms include: She notes clicking, popping, and catching in the shoulder. She reports neck pain (receives OMT for this). She reports night disturbance, pain more severe when lying on the L arm. She is R hand dominant.    At this time symptoms are worsening compared to onset, started to flare up again this past Friday.  She has been icing the shoulder with some relief. She has tried taking IBU with some relief. She has been trying to rest her arm as much as she is able.   02/15/2018: L shoulder Compared to the last office visit, her previously described symptoms are improving, still has some pain occasionally, also reports occasional clicking.  Current symptoms are moderate-severe when flaring up & are radiating to the neck and L arm. She reports that her L arm has been falling asleep a lot at night which causes night time disturbance.  She has been taking IBU prn and Tylenol arthritis with some relief.  She received steroid injection 01/11/18 and tolerated well, she reports good relief.   Neck/back pain Compared to the last office visit, her  previously described symptoms are improving. Current symptoms are mild & are nonradiating She has been IBU prn with some relief. She has responded well to OMT in the past.   REVIEW OF SYSTEMS: Reports night time disturbances. Denies fevers, chills, or night sweats. Denies unexplained weight loss. Denies personal history of cancer. Denies changes in bowel or bladder habits. Denies recent unreported falls. Denies new or worsening dyspnea or wheezing. Denies headaches or dizziness.  Reports numbness in LUE Denies dizziness or presyncopal episodes Reports lower extremity edema, first noticed yesterday, minimal, has been traveling recently. Denies increased warmth or erythema   HISTORY:  Prior history reviewed and updated per electronic medical record.  Social History   Occupational History  . Occupation: nanny  Tobacco Use  . Smoking status: Former Smoker    Last attempt to quit: 12/20/2015    Years since quitting: 2.1  . Smokeless tobacco: Never Used  Substance and Sexual Activity  . Alcohol use: No    Alcohol/week: 0.0 standard drinks  . Drug use: No  . 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 01/11/2018:  o X-ray left shoulder: Negative o Ultrasound guided injection of the left AC joint .   OBJECTIVE:  VS:  HT:5\' 3"  (160 cm)   WT:178 lb 12.8 oz (81.1 kg)  BMI:31.68    BP:110/80  HR:75bpm  TEMP: ( )  RESP:99 %   PHYSICAL EXAM: CONSTITUTIONAL: Well-developed, Well-nourished and In no acute distress PSYCHIATRIC: Alert & appropriately interactive. and Not depressed or anxious appearing. RESPIRATORY: No  increased work of breathing and Trachea Midline EYES: Pupils are equal., EOM intact without nystagmus. and No scleral icterus.  VASCULAR EXAM: Warm and well perfused NEURO: unremarkable  MSK Exam: Left shoulder  Well aligned, no significant deformity. No overlying skin changes. No focal bony tenderness TTP over Left periscapular and brachial plexus tenderness with some radicular symptoms with brachial plexus squeeze.   RANGE OF MOTION & STRENGTH  Full overhead range of motion of the shoulder with some weakness with terminal extension and abduction.   SPECIALITY TESTING:  No significant crepitation with axial loading circumduction today.  Normal empty can test with small amount of pain with speeds and worst pain with O'Brien's testing.  Negative Hawkins and Neer's.  Upper extremity strength is intact to rotator cuff testing    ASSESSMENT   1. Pain in thoracic spine   2. Chronic left shoulder pain   3. AC joint arthropathy   4. Somatic dysfunction of rib cage region   5. Somatic dysfunction of cervical region   6. Somatic dysfunction of thoracic region   7. Thoracic outlet syndrome of left thoracic outlet     PLAN:  Pertinent additional documentation may be included in corresponding procedure notes, imaging studies, problem based documentation and patient instructions.  Procedures:  . Osteopathic manipulation was performed today based on physical exam findings.  Please see procedure note for further information including Osteopathic Exam findings  Medications:  Meds ordered this encounter  Medications  . Diclofenac Sodium (PENNSAID) 2 % SOLN    Sig: Place 1 application onto the skin 2 (two) times daily for 1 day.    Dispense:  8 g    Refill:  0  . Diclofenac Sodium (PENNSAID) 2 % SOLN    Sig: Place 1 application onto the skin 2 (two) times daily.    Dispense:  112 g    Refill:  2    Home Phone      [phone] Mobile          [phone]     Discussion/Instructions: Thoracic outlet syndrome of left thoracic outlet She does have some symptoms that are concerning for potential neurogenic thoracic outlet syndrome on the left with arm numbness and tingling that involves the entire arm.  The symptoms do seem to be worse when she fatigue and sore.  Trial of Pennsaid for the left upper shoulder and pack to see if this will help loosen things up and reduce some of the soreness that she has with the [facility] joint arthropathy.  . AC joint and [place] joint dysfunction improved with osteopathic manipulation.  Likely contributing to underlying thoracic outlet syndrome. . Continue previously prescribed home exercise program.  . Discussed red flag symptoms that warrant earlier emergent evaluation and patient voices understanding. . Activity modifications and the importance of avoiding exacerbating activities (limiting pain to no more than a 4 / 10 during or following activity) recommended and discussed.  Follow-up:  . Return in about 8 weeks (around  04/12/2018).   . If any lack of improvement consider: referral to Physical Therapy and There are dry needling to the pectoralis muscles  . At follow up will plan to consider: repeat osteopathic manipulation     CMA/ATC served as scribe during this visit. History, Physical, and Plan performed by medical provider. Documentation and orders reviewed and attested to.      Andrena Mews, DO    [facility] Physician

## 2018-02-15 NOTE — Patient Instructions (Addendum)
Pennsaid instructions: You have been given a sample/prescription for Pennsaid, a topical medication.     You are to apply this gel to your injured body part twice daily (morning and evening).   A little goes a long way so you can use about a pea-sized amount for each area.   Spread this small amount over the area into a thin film and let it dry.   Be sure that you do not rub the gel into your skin for more than 10 or 15 seconds otherwise it can irritate you skin.    Once you apply the gel, please do not put any other lotion or clothing in contact with that area for 30 minutes to allow the gel to absorb into your skin.   Some people are sensitive to the medication and can develop a sunburn-like rash.  If you have only mild symptoms it is okay to continue to use the medication but if you have any breakdown of your skin you should discontinue its use and please let us know.   If you have been written a prescription for Pennsaid, you will receive a pump bottle of this topical gel through a mail order pharmacy.  The instructions on the bottle will say to apply two pumps twice a day which may be too much gel for your particular area so use the pea-sized amount as your guide.  Instructions for Duexis, Pennsaid and Vimovo:  Your prescription will be filled through a participating [facility] mail order pharmacy.  You will receive a phone call or text from one of the participating pharmacies which can be located in any state in the [place].  You must communicate directly with them to have this medication filled.  When the pharmacy contacts you, they will need your mailing address (for shipment of the medication) andy they will need payment information if you have a copay (typically no more than $10). If you have not heard from them 2-3 days after your appointment with Dr. Rigby, contact [facility] directly at [phone].  

## 2018-02-15 NOTE — Progress Notes (Signed)
PROCEDURE NOTE : OSTEOPATHIC MANIPULATION The decision today to treat with Osteopathic Manipulative Therapy (OMT) was based on physical exam findings. Verbal consent was obtained following a discussion with the patient regarding the of risks, benefits and potential side effects, including an acute pain flare,post manipulation soreness and need for repeat treatments.     Contraindications to OMT: NONE  Manipulation was performed as below: Regions Treated OMT Techniques Used  Cervical spine Thoracic spine Ribs HVLA muscle energy myofascial release articulatory soft tissue facilitated positional release   The patient tolerated the treatment well and reported Improved symptoms following treatment today. Patient was given medications, exercises, stretches and lifestyle modifications per AVS and verbally.   OSTEOPATHIC/STRUCTURAL EXAM:   OA - rotated right T2 -4 Neutral, Rotated LEFT, Sidebent RIGHT T7 -10 Neutral, Rotated RIGHT, Sidebent LEFT Rib 3 Left  Posterior

## 2018-02-15 NOTE — Assessment & Plan Note (Signed)
She does have some symptoms that are concerning for potential neurogenic thoracic outlet syndrome on the left with arm numbness and tingling that involves the entire arm.  The symptoms do seem to be worse when she fatigue and sore.  Trial of Pennsaid for the left upper shoulder and pack to see if this will help loosen things up and reduce some of the soreness that she has with the [facility] joint arthropathy.

## 2018-02-22 ENCOUNTER — Telehealth: Payer: Self-pay

## 2018-02-22 NOTE — Telephone Encounter (Signed)
PA needed to Pennsaid  Initiated via [email or web address]  Your information has been submitted to [facility]. [employer] [place] will review the request and fax you a determination directly, typically within 3 business days of your submission once all necessary information is received. If [employer] has not responded in 3 business days or if you have any questions about your submission, contact [employer] at [phone].

## 2018-02-23 NOTE — Telephone Encounter (Signed)
This request has received a Unfavorable outcome.  Please see letter faxed to your office for details on this adverse benefit determination.  Please note any additional information provided by [employer] [place] at the bottom of this request. 

## 2018-04-05 ENCOUNTER — Ambulatory Visit: Payer: BLUE CROSS/BLUE SHIELD | Admitting: Sports Medicine

## 2018-04-05 ENCOUNTER — Encounter: Payer: Self-pay | Admitting: Sports Medicine

## 2018-04-05 VITALS — BP 114/76 | HR 85 | Ht 63.0 in | Wt 178.4 lb

## 2018-04-05 DIAGNOSIS — M9905 Segmental and somatic dysfunction of pelvic region: Secondary | ICD-10-CM

## 2018-04-05 DIAGNOSIS — M546 Pain in thoracic spine: Secondary | ICD-10-CM | POA: Diagnosis not present

## 2018-04-05 DIAGNOSIS — M25512 Pain in left shoulder: Secondary | ICD-10-CM

## 2018-04-05 DIAGNOSIS — M9901 Segmental and somatic dysfunction of cervical region: Secondary | ICD-10-CM

## 2018-04-05 DIAGNOSIS — M9908 Segmental and somatic dysfunction of rib cage: Secondary | ICD-10-CM

## 2018-04-05 DIAGNOSIS — M9904 Segmental and somatic dysfunction of sacral region: Secondary | ICD-10-CM

## 2018-04-05 DIAGNOSIS — G8929 Other chronic pain: Secondary | ICD-10-CM

## 2018-04-05 DIAGNOSIS — M9902 Segmental and somatic dysfunction of thoracic region: Secondary | ICD-10-CM

## 2018-04-05 DIAGNOSIS — M9903 Segmental and somatic dysfunction of lumbar region: Secondary | ICD-10-CM

## 2018-04-05 DIAGNOSIS — M19019 Primary osteoarthritis, unspecified shoulder: Secondary | ICD-10-CM

## 2018-04-05 NOTE — Progress Notes (Signed)
Sheri FellsMichael D. Delorise Shinerigby, DO  [facility] [phone]  Sheri HarnessCassandra [name] - 27 y.o. female MRN [id number]  Date of birth: 04/28/1990  Visit Date: 04/05/2018    PCP: Sheri [name], Sheri K, MD   Referred by: Sheri [name], Sheri K, MD   SUBJECTIVE:  Chief Complaint  Patient presents with  . Follow-up    t-spine and L shoulder    HPI: Patient is here for follow-up of her thoracic spine left shoulder pain.  She reports overall doing mainly better but has had exams over the past few weeks and has been starting more and has had a worsening over the left anterior shoulder and left thoracic pain.  She has been using Pennsaid and ibuprofen.  She is using this usually once a day because if she uses it twice per day she does have some skin irritation.  She is requesting repeat manipulation today.  REVIEW OF SYSTEMS: She is having some nighttime disturbances due to this pain as well as stress and anxiety. She has numbness in the right upper extremity that does not radiate past the elbow.  HISTORY:  Prior history reviewed and updated per electronic medical record.  Social History   Occupational History  . Occupation: nanny  Tobacco Use  . Smoking status: Former Smoker    Last attempt to quit: 12/20/2015    Years since quitting: 2.4  . Smokeless tobacco: Never Used  Substance and Sexual Activity  . Alcohol use: No    Alcohol/week: 0.0 standard drinks  . Drug use: No  . 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 No specialty comments  available.  OBJECTIVE:  VS:  HT:5\' 3"  (160 cm)   WT:178 lb 6.4 oz (80.9 kg)  BMI:31.61    BP:114/76  HR:85bpm  TEMP: ( )  RESP:98 %   PHYSICAL EXAM: Adult female. No acute distress.  Alert and appropriate. Good cervical and lumbar range of motion although there are functional limitations. Negative Spurling's compression test and Lhermitte's compression test.   Upper extremity lower extremity strength is 5/5 in all myotomes. Normal sensation Significant anterior chain dominant posture. Left-sided thoracic muscle spasms.    ASSESSMENT   1. Pain in thoracic spine   2. Chronic left shoulder pain   3. AC joint arthropathy   4. Somatic dysfunction of cervical region   5. Somatic dysfunction of thoracic region   6. Somatic dysfunction of lumbar region   7. Somatic dysfunction of rib cage region   8. Somatic dysfunction of pelvis region   9. Somatic dysfunction of sacral region     PLAN:  Pertinent additional documentation may be included in corresponding procedure notes, imaging studies, problem based documentation and patient instructions.  Procedures:  Osteopathic manipulation was performed today based on physical exam findings.  Please see procedure note for further information including Osteopathic Exam findings   Medications:  No orders of the defined types were placed in this encounter.  Discussion/Instructions: No problem-specific Assessment & Plan notes found for this encounter.  Osteopathic manipulation was performed today based on physical exam findings.  Patient has responded well to osteopathic manipulation previously the prior manipulation did not provide permanent long lasting relief.  The patient does feel as though there was significant benefit to the prior manipulation and they wished for repeat manipulation today.  They understand that home therapeutic exercises are critical part of the healing/treatment process and will continue with self treatment between  now and their next visit as outlined.  The patient understands that the frequency of visits is meant to provide a stimulus to promote the body's own ability to heal and is not meant to be the sole means for improvement in their symptoms.  Increased stress from issues at work controlled to exercises she should continue with this.  Good response today to OMT.  Happy to see her back sooner but otherwise we will plan sooner follow-up than normal due to the increased stress.  Discussed sleep techniques that avoid addictive medications..  At follow up will plan : to consider repeat osteopathic manipulation   Return in about 3 weeks (around 04/26/2018) for consideration of repeat Osteopathic Manipulation.          Sheri Mews, DO    [facility]

## 2018-04-05 NOTE — Progress Notes (Signed)
PROCEDURE NOTE : OSTEOPATHIC MANIPULATION The decision today to treat with Osteopathic Manipulative Therapy (OMT) was based on physical exam findings. Verbal consent was obtained following a discussion with the patient regarding the of risks, benefits and potential side effects, including an acute pain flare,post manipulation soreness and need for repeat treatments.     Contraindications to OMT: NONE  Manipulation was performed as below: Regions Treated & Osteopathic Exam Findings  C2 FRS right (Flexed, Rotated & Sidebent) C5 FRS RIGHT (Flexed, Rotated & Sidebent) C6 Flexed, rotated LEFT, sidebent LEFT T2 - 8 Neutral, rotated LEFT, sidebent RIGHT Rib 5 Right  Posterior L3 FRS RIGHT (Flexed, Rotated & Sidebent) Right psoas spasm Right anterior innonimate L on L sacral torsion   OMT Techniques Used  . HVLA . muscle energy . myofascial release . soft tissue    The patient tolerated the treatment well and reported Improved symptoms following treatment today. Patient was given medications, exercises, stretches and lifestyle modifications per AVS and verbally.

## 2018-04-11 ENCOUNTER — Ambulatory Visit: Payer: BLUE CROSS/BLUE SHIELD | Admitting: Sports Medicine

## 2018-04-28 ENCOUNTER — Ambulatory Visit: Payer: BLUE CROSS/BLUE SHIELD | Admitting: Sports Medicine

## 2018-05-12 ENCOUNTER — Ambulatory Visit: Payer: BLUE CROSS/BLUE SHIELD | Admitting: Sports Medicine

## 2018-05-12 ENCOUNTER — Encounter: Payer: Self-pay | Admitting: Sports Medicine

## 2018-05-12 VITALS — BP 110/80 | HR 85 | Ht 63.0 in | Wt 178.6 lb

## 2018-05-12 DIAGNOSIS — G8929 Other chronic pain: Secondary | ICD-10-CM | POA: Diagnosis not present

## 2018-05-12 DIAGNOSIS — M9902 Segmental and somatic dysfunction of thoracic region: Secondary | ICD-10-CM | POA: Diagnosis not present

## 2018-05-12 DIAGNOSIS — M25512 Pain in left shoulder: Secondary | ICD-10-CM

## 2018-05-12 DIAGNOSIS — M9901 Segmental and somatic dysfunction of cervical region: Secondary | ICD-10-CM | POA: Diagnosis not present

## 2018-05-12 DIAGNOSIS — M9908 Segmental and somatic dysfunction of rib cage: Secondary | ICD-10-CM

## 2018-05-14 ENCOUNTER — Encounter: Payer: Self-pay | Admitting: Sports Medicine

## 2018-05-14 NOTE — Progress Notes (Signed)
[name]. [name] [phone]  Tracey Escovedo - 28 y.o. female MRN [id number]  Date of birth: [birth year]  Visit Date: 05/12/2018  PCP: Madelin Headings, MD   Referred by: Madelin Headings, MD  SUBJECTIVE:   Chief Complaint  Patient presents with  . Middle Back - Follow-up    XR T-spine 10/13/2016. Has responded well to OMT.   Marland Kitchen Left Shoulder - Follow-up    XR L shoulder 01/11/2018. Corticosteroid inj 01/11/2018. Tried IBU and Tylenol.     HPI: Patient presents with the above symptoms.  She reports the left shoulder is slightly tighter than in the past.  She is dealing with significant psychosocial stresses at this time and thinks that this is contributing.  She has been performing her home therapeutic exercises these have been beneficial when she is able to perform them but she does report that due to the stresses she has not been performing them as often as she would like to her feels like she needs to get the benefit she is looking for.  She is requesting repeat osteopathic manipulation today.  REVIEW OF SYSTEMS: She is having nighttime disturbances for multiple issues including psychosocial stressors.  She is getting headaches due to this.  She has some numbness in the left upper extremity that does not radiate past the elbow.  No significant weakness.   HISTORY:  Prior history reviewed and updated per electronic medical record.  Social History   Occupational History  . Occupation: nanny  Tobacco Use  . Smoking status: Former Smoker    Last attempt to quit: 12/20/2015    Years since quitting: 2.4  . Smokeless tobacco: Never Used  Substance and Sexual Activity  . Alcohol use: No    Alcohol/week: 0.0 standard drinks  . Drug use: No  . 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 No acute distress.  Alert and appropriate. Good cervical and lumbar range of motion although there are functional limitations. Negative Spurling's compression test and Lhermitte's compression test.   Upper extremity lower extremity strength is 5/5 in all myotomes. Normal sensation Significant anterior chain dominant posture.    ASSESSMENT   1. Chronic left shoulder pain   2. Somatic dysfunction of cervical region   3. Somatic dysfunction of thoracic region   4. Somatic dysfunction of rib cage region      PROCEDURES:   PROCEDURE NOTE : OSTEOPATHIC MANIPULATION The decision today to treat with Osteopathic Manipulative Therapy (OMT) was based on physical exam findings. Verbal consent was obtained following a discussion with the patient regarding the of risks, benefits and potential side effects, including an acute pain flare,post manipulation soreness and need for repeat treatments. Minimal risk of injury to neurovascular structures with cervical manipulation previously discussed in detailed and reaffirmed today.   Contraindications to  OMT: NONE  Manipulation was performed as below: Regions Treated & Osteopathic Exam Findings  CERVICAL SPINE:  OA - rotated right C6 ERS LEFT (Extended, Rotated & Sidebent) THORACIC SPINE:  T4 - 6 Neutral, rotated LEFT, sidebent RIGHT T8 ERS RIGHT (Extended, Rotated & Sidebent) RIBS:  Rib 7 Right  Posterior   OMT Techniques Used  HVLA muscle energy myofascial release    The patient tolerated the treatment well and reported Improved symptoms following treatment today. Patient was given medications, exercises, stretches and lifestyle  modifications per AVS and verbally.    PLAN:  Pertinent additional documentation may be included in corresponding procedure notes, imaging studies, problem based documentation and patient instructions.  Plan OMT  Activity modifications and the importance of avoiding exacerbating activities (limiting pain to no more than a 4 / 10 during or following activity) recommended and discussed. Discussed red flag symptoms that warrant earlier emergent evaluation and patient voices understanding.  No orders of the defined types were placed in this encounter.  Lab Orders  No laboratory test(s) ordered today   Imaging Orders  No imaging studies ordered today   Referral Orders  No referral(s) requested today    Return in about 3 weeks (around 06/02/2018) for consideration of repeat Osteopathic Manipulation.          Andrena Mews, DO    [facility]

## 2018-05-24 ENCOUNTER — Telehealth: Payer: BLUE CROSS/BLUE SHIELD | Admitting: Nurse Practitioner

## 2018-05-24 ENCOUNTER — Encounter: Payer: Self-pay | Admitting: Internal Medicine

## 2018-05-24 DIAGNOSIS — A084 Viral intestinal infection, unspecified: Secondary | ICD-10-CM

## 2018-05-24 MED ORDER — ONDANSETRON HCL 4 MG PO TABS: 4.0000 mg | ORAL_TABLET | Freq: Three times a day (TID) | ORAL | 0 refills | Status: DC | PRN

## 2018-05-24 NOTE — Progress Notes (Signed)
We are sorry that you are not feeling well. Here is how we plan to help!  Based on what you have shared with me it looks like you have a Virus that is irritating your GI tract.  Vomiting is the forceful emptying of a portion of the stomach's content through the mouth.  Although nausea and vomiting can make you feel miserable, it's important to remember that these are not diseases, but rather symptoms of an underlying illness.  When we treat short term symptoms, we always caution that any symptoms that persist should be fully evaluated in a medical office.  I have prescribed a medication that will help alleviate your symptoms and allow you to stay hydrated:  Zofran 4 mg 1 tablet every 8 hours as needed for nausea and vomiting  HOME CARE:  Drink clear liquids.  This is very important! Dehydration (the lack of fluid) can lead to a serious complication.  Start off with 1 tablespoon every 5 minutes for 8 hours.  You may begin eating bland foods after 8 hours without vomiting.  Start with saltine crackers, white bread, rice, mashed potatoes, applesauce.  After 48 hours on a bland diet, you may resume a normal diet.  Try to go to sleep.  Sleep often empties the stomach and relieves the need to vomit.  GET HELP RIGHT AWAY IF:   Your symptoms do not improve or worsen within 2 days after treatment.  You have a fever for over 3 days.  You cannot keep down fluids after trying the medication.  MAKE SURE YOU:   Understand these instructions.  Will watch your condition.  Will get help right away if you are not doing well or get worse.   Thank you for choosing an e-visit. Your e-visit answers were reviewed by a board certified advanced clinical practitioner to complete your personal care plan. Depending upon the condition, your plan could have included both over the counter or prescription medications. Please review your pharmacy choice. Be sure that the pharmacy you have chosen is open so  that you can pick up your prescription now.  If there is a problem you may message your provider in MyChart to have the prescription routed to another pharmacy. Your safety is important to [name]. If you have drug allergies check your prescription carefully.  For the next 24 hours, you can use MyChart to ask questions about today's visit, request a non-urgent call back, or ask for a work or school excuse from your e-visit provider. You will get an e-mail in the next two days asking about your experience. I hope that your e-visit has been valuable and will speed your recovery.  6 minutes was spent reviewing and documenting on chart. Mary-Margaret Daphine Deutscher, FNP

## 2018-06-06 ENCOUNTER — Ambulatory Visit: Payer: BLUE CROSS/BLUE SHIELD | Admitting: Sports Medicine

## 2018-06-06 ENCOUNTER — Encounter: Payer: Self-pay | Admitting: Sports Medicine

## 2018-06-06 VITALS — BP 110/82 | HR 81 | Ht 63.0 in | Wt 179.2 lb

## 2018-06-06 DIAGNOSIS — M9901 Segmental and somatic dysfunction of cervical region: Secondary | ICD-10-CM

## 2018-06-06 DIAGNOSIS — M25512 Pain in left shoulder: Secondary | ICD-10-CM

## 2018-06-06 DIAGNOSIS — M9908 Segmental and somatic dysfunction of rib cage: Secondary | ICD-10-CM

## 2018-06-06 DIAGNOSIS — M9902 Segmental and somatic dysfunction of thoracic region: Secondary | ICD-10-CM

## 2018-06-06 DIAGNOSIS — M546 Pain in thoracic spine: Secondary | ICD-10-CM | POA: Diagnosis not present

## 2018-06-06 DIAGNOSIS — G8929 Other chronic pain: Secondary | ICD-10-CM

## 2018-06-06 NOTE — Progress Notes (Signed)
Sheri [name]. [name] [facility] at [facility] [phone]  Sheri [name] - 28 y.o. female MRN [id number]  Date of birth: 05-May-1990  Visit Date: June 06, 2018  PCP: Madelin Headings, MD   Referred by: Madelin Headings, MD  SUBJECTIVE:  Chief Complaint  Patient presents with  . Left Shoulder - Follow-up    HPI: Patient is here for follow-up of left shoulder and mid back pain.  She is continuing to have tightness and some numbness radiating into the right and left arms.  Symptoms are worsened with sleeping, dry her hair and driving.  She has had increased stress level still due to going nonspecified legal issues.  She does feel that the osteopathic manipulation is beneficial and is requesting this again today.  REVIEW OF SYSTEMS: No significant nighttime awakenings due to this issue. Denies fevers, chills, recent weight gain or weight loss.  No night sweats.  Pt denies any change in bowel or bladder habits, muscle weakness, numbness or falls associated with this pain.  HISTORY:  Prior history reviewed and updated per electronic medical record.  Patient Active Problem List   Diagnosis Date Noted  . Thoracic outlet syndrome of left thoracic outlet 02/15/2018  . Left shoulder pain 01/11/2018    01/11/2018 XR L shoulder FINDINGS: There is no evidence of fracture or dislocation. There is no evidence of arthropathy or other focal bone abnormality. Soft tissues are unremarkable.   . Pain in thoracic spine 08/16/2017    10/13/2016 XR T-spine IMPRESSION: No acute or subacute osseous abnormality. Mild lower thoracic degenerative disc disease and spondylosis.   Marland Kitchen History of drug abuse (HCC) 12/30/2016  . Thoracic radiculopathy due to degenerative joint disease of spine 10/13/2016    CT and XR show at least 3 levels of DDD of thoracic spine  I suspect this is related to both the scoliosis and the history of dance   . Orthostatic  hypotension 05/18/2016  . Hypokalemia 05/17/2016  . Syncope and collapse 05/16/2016  . PCOS (polycystic ovarian syndrome) 02/13/2015  . IUD (intrauterine device) in place 02/13/2015  . Hx of pulmonary embolus 02/13/2015  . Bipolar depression (HCC)     under psych rx     Social History   Occupational History  . Occupation: nanny  Tobacco Use  . Smoking status: Former Smoker    Last attempt to quit: 12/20/2015    Years since quitting: 2.4  . Smokeless tobacco: Never Used  Substance and Sexual Activity  . Alcohol use: No    Alcohol/week: 0.0 standard drinks  . Drug use: No  . 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 No acute distress.  Alert and appropriate. Bilateral upper extremities overall well aligned.  She has full overhead range of motion.  Markedly tight paraspinal and paracervical muscle spasms.  Negative Spurling's compression test  and Lhermitte's compression test.  She is tense throughout the entire strap muscles.   ASSESSMENT:   1. Chronic left shoulder pain   2. Somatic dysfunction of cervical region   3. Somatic dysfunction of thoracic region   4. Somatic dysfunction of rib cage region   5. Pain in thoracic spine      PROCEDURES:  PROCEDURE NOTE: OSTEOPATHIC MANIPULATION   The decision today to treat with Osteopathic Manipulative Therapy (OMT) was based on physical exam findings. Verbal consent was obtained following a discussion with the patient regarding the of risks, benefits and potential side effects, including an acute  pain flare,post manipulation soreness and need for repeat treatments.  NONE  Manipulation was performed as below:  Regions Treated & Osteopathic Exam Findings   CERVICAL SPINE:   C2 - 4Flexed, rotated RIGHT, sidebent LEFT C6 - 7Extended, rotated RIGHT, sidebent LEFT THORACIC SPINE:    T2 - 4 Neutral, rotated RIGHT, sidebent LEFT T8 - 10 Neutral, rotated RIGHT, sidebent LEFT RIBS:   Rib 3Right  Inhalation dysfunction (depressed/exhaled)    OMT Techniques Used:   HVLA muscle energy myofascial release    The patient tolerated the treatment well and reported Improved symptoms following treatment today. Patient was given medications, exercises, stretches and lifestyle modifications per AVS and verbally.       PLAN:  Pertinent additional documentation may be included in corresponding procedure notes, imaging studies, problem based documentation and patient instructions.  No problem-specific Assessment & Plan notes found for this encounter.  She is continued to have fairly significant psychosocial stressors that are contributing to some of her ongoing pain.  She is actively working on addressing these.  Her underlying home therapeutic exercises need to be continued.  Home Therapeutic exercises prescribed today per procedure note.  Osteopathic manipulation was performed today based on physical exam findings.  Patient has responded well to osteopathic manipulation previously the prior manipulation did not provide permanent long lasting relief.  The patient does feel as though there was significant benefit to the prior manipulation and they wished for repeat manipulation today.  They understand that home therapeutic exercises are critical part of the healing/treatment process and will continue with self treatment between now and their next visit as outlined.  The patient understands that the frequency of visits is meant to provide a stimulus to promote the body's own ability to heal and is not  meant to be the sole means for improvement in their symptoms.  Activity modifications and the importance of avoiding exacerbating activities (limiting pain to no more than a 4 / 10 during or following activity) recommended and discussed.   Discussed red flag symptoms that warrant earlier emergent evaluation and patient voices understanding.    No orders of the defined types were placed in this encounter. Lab Orders  No laboratory test(s) ordered today   Imaging Orders  No imaging studies ordered today   Referral Orders  No referral(s) requested today    At follow up will plan to consider : repeat osteopathic manipulation  Return in about 3 weeks (around 06/27/2018) for consideration of repeat Osteopathic Manipulation.          Andrena Mews, DO    [facility]

## 2018-06-09 ENCOUNTER — Ambulatory Visit: Payer: Self-pay

## 2018-06-09 NOTE — Telephone Encounter (Signed)
Please advise 

## 2018-06-09 NOTE — Telephone Encounter (Signed)
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 Pt has had close physical contact nut in not having symptoms. Pt asking for Tamiflu. If prescription is approved, pt requests to call to  [facility] on file. Reason for Disposition . [1] Influenza EXPOSURE within last 48 hours (2 days) AND [2] NOT HIGH RISK AND [3] strongly requests antiviral medication  Answer Assessment - Initial Assessment Questions 1. 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 DATE of EXPOSURE: "When did the exposure occur?" (e.g., hour, days, weeks)     Monday through today 3. PREGNANCY: "Is there any chance you are pregnant?" "When was your last menstrual period?"     No- LMP: ended 2 days ago 4. HIGH RISK for COMPLICATIONS: "Do you have any heart or lung problems? Do you have a weakened immune system?" (e.g., CHF, COPD, asthma, HIV positive, chemotherapy, renal failure, diabetes mellitus, sickle cell anemia)     No heart or lung problems, had pulmonary embolisms x2 8 years ago 5. SYMPTOMS: "Do you have any symptoms?" (e.g., cough, fever, sore throat, difficulty breathing).     No  Protocols used: INFLUENZA EXPOSURE-A-AH

## 2018-06-09 NOTE — Telephone Encounter (Signed)
Pt says she will cal if any symptoms

## 2018-06-09 NOTE — Telephone Encounter (Signed)
I do not advise "prophylazxis" in her situation as it may not be that helpful  However if gets fever cough flu like symptoms contacgt [name] right away  And  Would consider  Adding med  With or without visit depending on the symptoms  She has

## 2018-06-13 DIAGNOSIS — I340 Nonrheumatic mitral (valve) insufficiency: Secondary | ICD-10-CM | POA: Insufficient documentation

## 2018-06-13 HISTORY — DX: Nonrheumatic mitral (valve) insufficiency: I34.0

## 2018-06-13 NOTE — Progress Notes (Signed)
Cardiology Office Note    Date:  06/14/2018   ID:  Sheri [name], DOB 08/18/1990, MRN [id number]  PCP:  Sheri Headings, MD  Cardiologist: Tobias Alexander, MD EPS: None  Chief Complaint  Patient presents with  . Follow-up    History of Present Illness:  Sheri [name] is a 28 y.o. female with history of pulmonary embolus in 2011 associated with oral contraceptives, history of syncope in 2018 in the setting of norovirus felt secondary to dehydration and hypotension.  CT angiogram 05/16/2016- for PE, 2D echo at that time normal LVEF 60 to 65% with mild MR/TR.  Also has history of bipolar disorder, mild HLD, depression, prior heroin addiction.  Last seen in our office by Sheri Spies, PA-C 06/16/2017.  At that time she was having some palpitations lasting a few seconds.  Stress test was discussed but patient wanted to hold off which was reasonable.  Patient comes in for yearly f/u. Has occasional palpitations lasting 30-40 secs once or twice a month. Occurs at rest. She takes deep breaths and it goes away. Drinks 1 cup of coffee and 7 ounces diet coke in afternoon. Walks 2 miles 3 times/week. Does yoga also.  Plans to have her IUD removed in 1 year and try to have a baby.    Past Medical History:  Diagnosis Date  . Alcohol addiction (HCC)   . Anxiety   . Bipolar depression (HCC)    under psych rx   . Depression   . Drug addiction in remission [facility])    heroin  . Enteritis due to Norovirus 05/18/2016  . Hyperlipidemia   . Mild mitral regurgitation   . Mild tricuspid regurgitation   . Near syncope 05/16/2016   a. felt due to norovirus.  . Obesity   . PCOS (polycystic ovarian syndrome) 02/13/2015  . Pulmonary embolism (HCC) 2011   neg heme evaluation  felt to be from ocps   . Smoker 12/30/2016    Past Surgical History:  Procedure Laterality Date  . NO PAST SURGERIES      Current Medications: Current Meds  Medication Sig  . Diclofenac Sodium (PENNSAID) 2 % SOLN  Place 1 application onto the skin 2 (two) times daily.  . hydrOXYzine (ATARAX/VISTARIL) 25 MG tablet 25-50 mg Po As needed with flying q 8 hours  . metFORMIN (GLUCOPHAGE-XR) 500 MG 24 hr tablet TAKE 1 TABLET BY MOUTH EVERY DAY FOR 1 WEEK THEN 1 TABLET TWICE A DAY  . ondansetron (ZOFRAN) 4 MG tablet Take 1 tablet (4 mg total) by mouth every 8 (eight) hours as needed for nausea or vomiting.  Marland Kitchen PARAGARD INTRAUTERINE COPPER IUD IUD 1 each by Intrauterine route once.   Current Facility-Administered Medications for the 06/14/18 encounter (Office Visit) with Dyann Kief, PA-C  Medication  . 0.9 %  sodium chloride infusion     Allergies:   Bupropion   Social History   Socioeconomic History  . Marital status: Single    Spouse name: Not on file  . Number of children: Not on file  . Years of education: Not on file  . Highest education level: Not on file  Occupational History  . Occupation: nanny  Social Needs  . Financial resource strain: Not on file  . Food insecurity:    Worry: Not on file    Inability: Not on file  . Transportation needs:    Medical: Not on file    Non-medical: Not on file  Tobacco Use  .  Smoking status: Former Smoker    Last attempt to quit: 12/20/2015    Years since quitting: 2.4  . Smokeless tobacco: Never Used  Substance and Sexual Activity  . Alcohol use: No    Alcohol/week: 0.0 standard drinks  . Drug use: No  . Sexual activity: Not on file  Lifestyle  . Physical activity:    Days per week: Not on file    Minutes per session: Not on file  . Stress: Not on file  Relationships  . Social connections:    Talks on phone: Not on file    Gets together: Not on file    Attends religious service: Not on file    Active member of club or organization: Not on file    Attends meetings of clubs or organizations: Not on file    Relationship status: Not on file  Other Topics Concern  . 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   ROS:   Please see the history of present illness.    Review of Systems  Constitution: Negative.  HENT: Negative.   Eyes: Negative.   Cardiovascular: Positive for palpitations.  Respiratory: Negative.   Hematologic/Lymphatic: Negative.   Musculoskeletal: Negative.  Negative for joint pain.  Gastrointestinal: Negative.   Genitourinary: Negative.   Neurological: Negative.    All other systems reviewed and are negative.   PHYSICAL EXAM:   VS:  BP 112/78   Pulse 81   Ht 5\' 3"  (1.6 m)   Wt 180 lb 6.4 oz (81.8 kg)   SpO2 99%   BMI 31.96 kg/m   Physical Exam  GEN: Well nourished, well developed, in no acute distress  Neck: no JVD, carotid bruits, or masses Cardiac:RRR; 1/6 systolic murmur at the apex Respiratory:  clear to auscultation bilaterally, normal work of breathing GI: soft, nontender, nondistended, + BS Ext: without cyanosis, clubbing, or edema, Good distal pulses bilaterally Neuro:  Alert and Oriented x 3 Psych: euthymic mood, full affect  Wt Readings from Last 3 Encounters:  06/14/18 180 lb 6.4 oz (81.8 kg)  06/06/18 179 lb 3.2 oz (81.3 kg)  05/12/18 178 lb 9.6 oz (81 kg)      Studies/Labs Reviewed:   EKG:  EKG is ordered today.  The ekg ordered today demonstrates sinus rhythm, normal EKG  Recent Labs: 07/26/2017: ALT 13; BUN 10; Creatinine, Ser 0.71; Hemoglobin 13.2; Platelets 351.0; Potassium 4.5; Sodium 139; TSH 1.85   Lipid Panel    Component Value Date/Time   CHOL 186 07/26/2017 1028   TRIG 102.0 07/26/2017 1028   HDL 52.00 07/26/2017 1028   CHOLHDL 4 07/26/2017 1028    VLDL 20.4 07/26/2017 1028   LDLCALC 113 (H) 07/26/2017 1028   LDLDIRECT 150.0 02/13/2015 1449    Additional studies/ records that were reviewed today include:  2D echo 04/2016 Study Conclusions   - Left ventricle: The cavity size was normal. Wall thickness was   increased in a pattern of mild LVH. Systolic function was normal.  The estimated ejection fraction was in the range of 60% to 65%.   Wall motion was normal; there were no regional wall motion   abnormalities. Left ventricular diastolic function parameters   were normal. - Mitral valve: There was mild regurgitation. - Atrial septum: No defect or patent foramen ovale was identified. - Tricuspid valve: There was mild regurgitation. - Pulmonary arteries: PA peak pressure: 36 mm Hg (S).       ASSESSMENT:    1. Hx of pulmonary embolus   2. Syncope and collapse   3. Mitral valve insufficiency, unspecified etiology      PLAN:  In order of problems listed above:  History of pulmonary embolus in 2011 felt secondary to oral contraceptives-patient plans to get pregnant next year and wanted [name] to be aware.  History of syncope felt secondary to dehydration from norovirus 2018 no recurrence  Mild MR on echo in 2018 minimal murmur on exam.  Will not repeat 2D echo at this time.  Follow-up with Dr. Delton See in 1 year.  Palpitations self-limiting.    Medication Adjustments/Labs and Tests Ordered: Current medicines are reviewed at length with the patient today.  Concerns regarding medicines are outlined above.  Medication changes, Labs and Tests ordered today are listed in the Patient Instructions below. Patient Instructions  Medication Instructions:  Your physician recommends that you continue on your current medications as directed. Please refer to the Current Medication list given to you today.  If you need a refill on your cardiac medications before your next appointment, please call your pharmacy.   Lab work: None  Ordered  If you have labs (blood work) drawn today and your tests are completely normal, you will receive your results only by: Marland Kitchen MyChart Message (if you have MyChart) OR . A paper copy in the mail If you have any lab test that is abnormal or we need to change your treatment, we will call you to review the results.  Testing/Procedures: None ordered  Follow-Up: At [facility], you and your health needs are our priority.  As part of our continuing mission to provide you with exceptional heart care, we have created designated Provider Care Teams.  These Care Teams include your primary Cardiologist (physician) and Advanced Practice Providers (APPs -  Physician Assistants and Nurse Practitioners) who all work together to provide you with the care you need, when you need it. . You will need a follow up appointment in 1 year.  Please call our office 2 months in advance to schedule this appointment.  You may see Tobias Alexander, MD or one of the following Advanced Practice Providers on your designated Care Team:   . Robbie Lis, PA-C . Dayna Dunn, PA-C . Jacolyn Reedy, PA-C  Any Other Special Instructions Will Be Listed Below (If Applicable).       Elson Clan, PA-C  06/14/2018 10:44 AM    [facility] [address] [place], [place], [place]  [address] Phone: [phone]; Fax: [phone]

## 2018-06-14 ENCOUNTER — Encounter: Payer: Self-pay | Admitting: Physician Assistant

## 2018-06-14 ENCOUNTER — Ambulatory Visit: Payer: BLUE CROSS/BLUE SHIELD | Admitting: Physician Assistant

## 2018-06-14 VITALS — BP 112/78 | HR 81 | Ht 63.0 in | Wt 180.4 lb

## 2018-06-14 DIAGNOSIS — Z86711 Personal history of pulmonary embolism: Secondary | ICD-10-CM

## 2018-06-14 DIAGNOSIS — I340 Nonrheumatic mitral (valve) insufficiency: Secondary | ICD-10-CM

## 2018-06-14 DIAGNOSIS — R55 Syncope and collapse: Secondary | ICD-10-CM | POA: Diagnosis not present

## 2018-06-14 NOTE — Patient Instructions (Signed)
Medication Instructions:  Your physician recommends that you continue on your current medications as directed. Please refer to the Current Medication list given to you today.  If you need a refill on your cardiac medications before your next appointment, please call your pharmacy.   Lab work: None Ordered  If you have labs (blood work) drawn today and your tests are completely normal, you will receive your results only by: . MyChart Message (if you have MyChart) OR . A paper copy in the mail If you have any lab test that is abnormal or we need to change your treatment, we will call you to review the results.  Testing/Procedures: None ordered  Follow-Up: At [facility], you and your health needs are our priority.  As part of our continuing mission to provide you with exceptional heart care, we have created designated Provider Care Teams.  These Care Teams include your primary Cardiologist (physician) and Advanced Practice Providers (APPs -  Physician Assistants and Nurse Practitioners) who all work together to provide you with the care you need, when you need it. . You will need a follow up appointment in 1 year.  Please call our office 2 months in advance to schedule this appointment.  You may see Katarina Nelson, MD or one of the following Advanced Practice Providers on your designated Care Team:   . Brittainy Simmons, PA-C . Dayna Dunn, PA-C . Michele Lenze, PA-C  Any Other Special Instructions Will Be Listed Below (If Applicable).    

## 2018-06-24 ENCOUNTER — Encounter: Payer: BLUE CROSS/BLUE SHIELD | Admitting: Internal Medicine

## 2018-06-28 ENCOUNTER — Encounter: Payer: Self-pay | Admitting: Sports Medicine

## 2018-06-28 ENCOUNTER — Ambulatory Visit: Payer: BLUE CROSS/BLUE SHIELD | Admitting: Sports Medicine

## 2018-06-28 VITALS — BP 110/80 | HR 91 | Ht 63.0 in | Wt 179.8 lb

## 2018-06-28 DIAGNOSIS — M25512 Pain in left shoulder: Secondary | ICD-10-CM | POA: Diagnosis not present

## 2018-06-28 DIAGNOSIS — M9902 Segmental and somatic dysfunction of thoracic region: Secondary | ICD-10-CM | POA: Diagnosis not present

## 2018-06-28 DIAGNOSIS — M546 Pain in thoracic spine: Secondary | ICD-10-CM

## 2018-06-28 DIAGNOSIS — M9908 Segmental and somatic dysfunction of rib cage: Secondary | ICD-10-CM | POA: Diagnosis not present

## 2018-06-28 DIAGNOSIS — G8929 Other chronic pain: Secondary | ICD-10-CM | POA: Diagnosis not present

## 2018-06-28 DIAGNOSIS — M9901 Segmental and somatic dysfunction of cervical region: Secondary | ICD-10-CM | POA: Diagnosis not present

## 2018-06-28 DIAGNOSIS — M545 Low back pain, unspecified: Secondary | ICD-10-CM

## 2018-06-28 DIAGNOSIS — M9904 Segmental and somatic dysfunction of sacral region: Secondary | ICD-10-CM

## 2018-06-28 DIAGNOSIS — M9903 Segmental and somatic dysfunction of lumbar region: Secondary | ICD-10-CM

## 2018-06-28 DIAGNOSIS — M9905 Segmental and somatic dysfunction of pelvic region: Secondary | ICD-10-CM

## 2018-06-28 NOTE — Progress Notes (Signed)
Sheri [name]. [name] at [facility] [phone]  Sheri [name] - 28 y.o. female MRN [id number]  Date of birth: [birth year]  Visit Date: June 28, 2018  PCP: Madelin Headings, MD   Referred by: Madelin Headings, MD  SUBJECTIVE:  Chief Complaint  Patient presents with  . Middle Back - Follow-up    XR T-spine 01/11/18. IBU or Tylenol prn. Has responded well to OMT.   Marland Kitchen Left Shoulder - Follow-up    XR L shoulder 01/11/18. Corticosteroid inj 01/11/18. IBU or Tylenol prn.   . Neck - Follow-up    IBU or Tylenol prn. Has responded well to OMT.      HPI: She continues to have symptoms as above but reports feeling better and does report that her psychosocial stressors are slightly improving she denies any numbness tingling or significant weakness that has been progressive.  She does have some generalized ulnar nerve distribution dysesthesia bilaterally.  REVIEW OF SYSTEMS: No significant nighttime awakenings due to this issue. Denies fevers, chills, recent weight gain or weight loss.  No night sweats.   HISTORY:  Prior history reviewed and updated per electronic medical record.  Patient Active Problem List   Diagnosis Date Noted  . Mitral regurgitation 06/13/2018  . Thoracic outlet syndrome of left thoracic outlet 02/15/2018  . Left shoulder pain 01/11/2018    01/11/2018 XR L shoulder FINDINGS: There is no evidence of fracture or dislocation. There is no evidence of arthropathy or other focal bone abnormality. Soft tissues are unremarkable.   . Pain in thoracic spine 08/16/2017    10/13/2016 XR T-spine IMPRESSION: No acute or subacute osseous abnormality. Mild lower thoracic degenerative disc disease and spondylosis.   Marland Kitchen History of drug abuse (HCC) 12/30/2016  . Thoracic radiculopathy due to degenerative joint disease of spine 10/13/2016    CT and XR show at least 3 levels of DDD of thoracic spine  I suspect this is related to  both the scoliosis and the history of dance   . Orthostatic hypotension 05/18/2016  . Hypokalemia 05/17/2016  . Syncope and collapse 05/16/2016  . PCOS (polycystic ovarian syndrome) 02/13/2015  . IUD (intrauterine device) in place 02/13/2015  . Hx of pulmonary embolus 02/13/2015  . Bipolar depression (HCC)     under psych rx     Social History   Occupational History  . Occupation: nanny  Tobacco Use  . Smoking status: Former Smoker    Last attempt to quit: 12/20/2015    Years since quitting: 2.5  . Smokeless tobacco: Never Used  Substance and Sexual Activity  . Alcohol use: No    Alcohol/week: 0.0 standard drinks  . Drug use: No  . 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 No acute distress.  Alert and appropriate. Full overhead range of motion.  She does have a  slightly prominent anterior carriage of her shoulders and head.  She has good range of motion of her shoulders and her strength is intact to rotator cuff testing and UE Myotomes.  Negative Spurling's compression test and Lhermitte's compression test.   ASSESSMENT:   1. Chronic left shoulder pain   2. Somatic dysfunction of cervical region   3. Somatic dysfunction of thoracic region   4. Somatic dysfunction of rib cage region   5. Pain in thoracic spine   6. Somatic dysfunction of lumbar region   7. Somatic dysfunction of pelvis region   8. Somatic dysfunction of sacral region   9. Acute left-sided low back pain without sciatica     PROCEDURES:  PROCEDURE NOTE: OSTEOPATHIC  MANIPULATION  The decision today to treat with Osteopathic Manipulative Therapy (OMT) was based on physical exam findings. Verbal consent was obtained following a discussion with the patient regarding the of risks, benefits and potential side effects, including an acute pain flare,post manipulation soreness and need for repeat treatments. Minimal risk of injury to neurovascular structures with cervical manipulation previously discussed in detailed and reaffirmed today. Contraindications to OMT: NONE Manipulation was performed as below: [facility] Treated & Osteopathic Exam Findings CERVICAL SPINE: OA - rotated right C3 ERS RIGHT (Extended, Rotated & Sidebent) THORACIC SPINE:  T2 - 4 Neutral, rotated LEFT, sidebent RIGHT T6 FRS RIGHT (Flexed, Rotated & Sidebent) RIBS:  Rib 5 Right  Exhalation dysfunction (elevated/inhaled) LUMBAR SPINE:  L3 FRS RIGHT (Flexed, Rotated & Sidebent) PELVIS:  Right psoas spasm Right anterior innonimate SACRUM:  L on L sacral torsion  OMT Techniques Used: HVLA muscle energy myofascial release HVLA - Long Lever  The patient tolerated the treatment well and reported Improved symptoms following treatment today. Patient was given medications, exercises, stretches and lifestyle modifications per AVS and verbally.      PLAN:  Pertinent additional documentation may be included in corresponding procedure notes, imaging studies, problem based documentation and patient instructions.  No problem-specific Assessment & Plan notes found for this encounter.   Overall her psychosocial stressors have decreased and she is doing overall markedly better.  She continues to have some legal issues but is currently working on fixing up a new home for her husband.  Continue previously prescribed home exercise program.   Osteopathic manipulation was performed today based on physical exam findings.  Patient has responded well to osteopathic manipulation previously the prior  manipulation did not provide permanent long lasting relief.  The patient does feel as though there was significant benefit to the prior manipulation and they wished for repeat manipulation today.  They understand that home therapeutic exercises are critical part of the healing/treatment process and will continue with self treatment between now and their next visit as outlined.  The patient understands that the frequency of visits is meant to provide a stimulus to promote the body's own ability to heal and is not meant to be the sole means for improvement in their symptoms.  Activity modifications and the importance of avoiding exacerbating activities (limiting pain to no more than a 4 / 10 during or following activity) recommended and discussed.   Discussed red flag symptoms that warrant earlier emergent evaluation and patient voices understanding.    No orders of the defined types were placed in this encounter.  Lab Orders  No laboratory test(s) ordered today   Imaging Orders  No imaging studies ordered today   Referral Orders  No referral(s) requested today    Return in about  4 weeks (around 07/26/2018) for consideration of repeat Osteopathic Manipulation.          Andrena Mews, DO    [facility] Physician

## 2018-07-28 ENCOUNTER — Ambulatory Visit: Payer: BLUE CROSS/BLUE SHIELD | Admitting: Sports Medicine

## 2018-08-31 ENCOUNTER — Other Ambulatory Visit: Payer: Self-pay

## 2018-08-31 ENCOUNTER — Ambulatory Visit: Payer: BLUE CROSS/BLUE SHIELD | Admitting: Family Medicine

## 2018-08-31 ENCOUNTER — Encounter: Payer: Self-pay | Admitting: Family Medicine

## 2018-08-31 DIAGNOSIS — G2589 Other specified extrapyramidal and movement disorders: Secondary | ICD-10-CM | POA: Diagnosis not present

## 2018-08-31 DIAGNOSIS — M999 Biomechanical lesion, unspecified: Secondary | ICD-10-CM

## 2018-08-31 MED ORDER — TIZANIDINE HCL 2 MG PO CAPS: 2.0000 mg | ORAL_CAPSULE | Freq: Every evening | ORAL | 0 refills | Status: DC | PRN

## 2018-08-31 NOTE — Progress Notes (Signed)
[name] [address] [place], [place] [address] Phone: [phone] Subjective:   I Sheri [name] am serving as a Neurosurgeon for Dr. Antoine Primas.   CC: Neck pain follow-up  PPG:[id number]  Sheri [name] is a 28 y.o. female coming in with complaint of right sided neck and shoulder pain. States that she is doing better but not 100%  Had seen another provider 2 months ago.  Was doing manipulation made some mild improvement.  Patient denies any type of radiation of pain.  States cycle difficult sometimes to standing position for too long.  Left shoulder pain that she was seen previously for has improved   Patient had x-rays of the thoracic spine in 2018 with very mild lower thoracic degenerative disc disease and spondylosis.  Past medical history significant for bipolar disease history of pulmonary embolism and drug abuse  Past Medical History:  Diagnosis Date  . Alcohol addiction (HCC)   . Anxiety   . Bipolar depression (HCC)    under psych rx   . Depression   . Drug addiction in remission [facility])    heroin  . Enteritis due to Norovirus 05/18/2016  . Hyperlipidemia   . Mild mitral regurgitation   . Mild tricuspid regurgitation   . Near syncope 05/16/2016   a. felt due to norovirus.  . Obesity   . PCOS (polycystic ovarian syndrome) 02/13/2015  . Pulmonary embolism (HCC) 2011   neg heme evaluation  felt to be from ocps   . Smoker 12/30/2016   Past Surgical History:  Procedure Laterality Date  . NO PAST SURGERIES     Social History   Socioeconomic History  . Marital status: Single    Spouse name: Not on file  . Number of children: Not on file  . Years of education: Not on file  . Highest education level: Not on file  Occupational History  . Occupation: nanny  Social Needs  . Financial resource strain: Not on file  . Food insecurity:    Worry: Not on file    Inability: Not on file  . Transportation needs:    Medical: Not on file     Non-medical: Not on file  Tobacco Use  . Smoking status: Former Smoker    Last attempt to quit: 12/20/2015    Years since quitting: 2.7  . Smokeless tobacco: Never Used  Substance and Sexual Activity  . Alcohol use: No    Alcohol/week: 0.0 standard drinks  . Drug use: No  . Sexual activity: Not on file  Lifestyle  . Physical activity:    Days per week: Not on file    Minutes per session: Not on file  . Stress: Not on file  Relationships  . Social connections:    Talks on phone: Not on file    Gets together: Not on file    Attends religious service: Not on file    Active member of club or organization: Not on file    Attends meetings of clubs or organizations: Not on file    Relationship status: Not on file  Other Topics Concern  . 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 Bupropion Other (See Comments)   Family History  Problem Relation Age of Onset  . Colon cancer Maternal Grandfather   . Hypertension Father   . Irritable bowel syndrome Father     Current Outpatient Medications (Endocrine & Metabolic):  .  metFORMIN (GLUCOPHAGE-XR) 500 MG 24 hr tablet, TAKE 1 TABLET BY MOUTH EVERY DAY FOR 1 WEEK THEN 1 TABLET TWICE A DAY .  PARAGARD INTRAUTERINE COPPER IUD IUD, 1 each by Intrauterine route once.           Current Outpatient Medications (Other):  Marland Kitchen.  Diclofenac Sodium (PENNSAID) 2 % SOLN, Place 1 application onto the skin 2 (two) times daily. .  hydrOXYzine (ATARAX/VISTARIL) 25 MG tablet, 25-50 mg Po As needed with flying q 8 hours .  ondansetron (ZOFRAN) 4 MG tablet, Take 1 tablet (4 mg total) by mouth every 8 (eight) hours as needed for nausea or vomiting. .  sertraline (ZOLOFT) 50  MG tablet, Take 50 mg by mouth at bedtime. .  tizanidine (ZANAFLEX) 2 MG capsule, Take 1 capsule (2 mg total) by mouth at bedtime as needed for muscle spasms.  Current Facility-Administered Medications (Other):  .  0.9 %  sodium chloride infusion    Past medical history, social, surgical and family history all reviewed in electronic medical record.  No pertanent information unless stated regarding to the chief complaint.   Review of Systems:  No headache, visual changes, nausea, vomiting, diarrhea, constipation, dizziness, abdominal pain, skin rash, fevers, chills, night sweats, weight loss, swollen lymph nodes, body aches, joint swelling, chest pain, shortness of breath, mood changes.  Positive muscle aches  Objective  Blood pressure 116/82, pulse 94, height 5\' 3"  (1.6 m), weight 179 lb (81.2 kg), SpO2 98 %.    General: No apparent distress alert and oriented x3 mood and affect normal, dressed appropriately.  HEENT: Pupils equal, extraocular movements intact  Respiratory: Patient's speak in full sentences and does not appear short of breath  Cardiovascular: No lower extremity edema, non tender, no erythema  Skin: Warm dry intact with no signs of infection or rash on extremities or on axial skeleton.  Abdomen: Soft nontender  Neuro: Cranial nerves II through XII are intact, neurovascularly intact in all extremities with 2+ DTRs and 2+ pulses.  Lymph: No lymphadenopathy of posterior or anterior cervical chain or axillae bilaterally.  Gait normal with good balance and coordination.  MSK:  Non tender with full range of motion and good stability and symmetric strength and tone of shoulders, elbows, wrist, hip, knee and ankles bilaterally.  Patient's upper back shows the patient does have some mild scapular dyskinesis.  Does seem to have some difficulty with certain range of motion.  Inferior scapula seems on the right side weak  Osteopathic findings C2 flexed rotated and side bent right C6  flexed rotated and side bent left T6 extended rotated and side bent right inhaled ribri       Impression and Recommendations:     This case required medical decision making of moderate complexity. The above documentation has been reviewed and is accurate and complete Judi SaaZachary M Rusty Glodowski, DO       Note: This dictation was prepared with Dragon dictation along with smaller phrase technology. Any transcriptional errors that result from this process are unintentional.

## 2018-08-31 NOTE — Patient Instructions (Addendum)
Good to meet you  Stay active Exercises 3 times a week.   Zanaflex at night for 3 nights then as needed  Keep hands within peripheral vison  pennsaid pinkie amount topically 2 times daily as needed.  Ice 20 minutes 2 times daily. Usually after activity and before bed. See me again in 3-6 weeks

## 2018-09-01 DIAGNOSIS — G2589 Other specified extrapyramidal and movement disorders: Secondary | ICD-10-CM

## 2018-09-01 DIAGNOSIS — M999 Biomechanical lesion, unspecified: Secondary | ICD-10-CM

## 2018-09-01 HISTORY — DX: Biomechanical lesion, unspecified: M99.9

## 2018-09-01 HISTORY — DX: Other specified extrapyramidal and movement disorders: G25.89

## 2018-09-01 NOTE — Assessment & Plan Note (Signed)
Patient has more of a scapular dyskinesis that I think is contributing to more of the discomfort and pain.  This is more muscle instabilities.  Patient denies any radiation down the arm.  Patient responded well to osteopathic manipulation.  We discussed posture and ergonomics.  Follow-up again in 6 to 8 weeks

## 2018-09-01 NOTE — Assessment & Plan Note (Signed)
Decision today to treat with OMT was based on Physical Exam  After verbal consent patient was treated with HVLA, ME, FPR techniques in cervical, thoracic, rib areas  Patient tolerated the procedure well with improvement in symptoms  Patient given exercises, stretches and lifestyle modifications  See medications in patient instructions if given  Patient will follow up in 6-8 weeks 

## 2018-09-26 ENCOUNTER — Ambulatory Visit: Payer: BLUE CROSS/BLUE SHIELD | Admitting: Family Medicine

## 2018-09-27 NOTE — Progress Notes (Signed)
Sheri ScaleZach Bryant [name] D.O. [facility] [address] [place], [place] [address] Phone: [phone] Subjective:   Sheri [name], Sheri [name], am serving as a scribe for Dr. Antoine PrimasZachary Caylynn [name].  I'm seeing this patient by the request  of:    CC: Left shoulder and back pain  BJY:NWGNFAOZHYHPI:Subjective   08/31/2018: Patient has more of a scapular dyskinesis that I think is contributing to more of the discomfort and pain.  This is more muscle instabilities.  Patient denies any radiation down the arm.  Patient responded well to osteopathic manipulation.  We discussed posture and ergonomics.  Follow-up again in 6 to 8 weeks  Update 09/28/2018: Sheri HarnessCassandra [name] is a 28 y.o. female coming in with complaint of upper back pain. States that she has been having pain over the right trap since last visit that has not gone away. States little improvement since last visit. Is using Pennsaid.  Patient feels like she has made some improvement and continues to do relatively well.  Patient does notice that anxiety does contribute to it.    Past Medical History:  Diagnosis Date  . Alcohol addiction (HCC)   . Anxiety   . Bipolar depression (HCC)    under psych rx   . Depression   . Drug addiction in remission [facility](HCC)    heroin  . Enteritis due to Norovirus 05/18/2016  . Hyperlipidemia   . Mild mitral regurgitation   . Mild tricuspid regurgitation   . Near syncope 05/16/2016   a. felt due to norovirus.  . Obesity   . PCOS (polycystic ovarian syndrome) 02/13/2015  . Pulmonary embolism (HCC) 2011   neg heme evaluation  felt to be from ocps   . Smoker 12/30/2016   Past Surgical History:  Procedure Laterality Date  . NO PAST SURGERIES     Social History   Socioeconomic History  . Marital status: Single    Spouse name: Not on file  . Number of children: Not on file  . Years of education: Not on file  . Highest education level: Not on file  Occupational History  . Occupation: nanny  Social Needs  . Financial resource  strain: Not on file  . Food insecurity:    Worry: Not on file    Inability: Not on file  . Transportation needs:    Medical: Not on file    Non-medical: Not on file  Tobacco Use  . Smoking status: Former Smoker    Last attempt to quit: 12/20/2015    Years since quitting: 2.7  . Smokeless tobacco: Never Used  Substance and Sexual Activity  . Alcohol use: No    Alcohol/week: 0.0 standard drinks  . Drug use: No  . Sexual activity: Not on file  Lifestyle  . Physical activity:    Days per week: Not on file    Minutes per session: Not on file  . Stress: Not on file  Relationships  . Social connections:    Talks on phone: Not on file    Gets together: Not on file    Attends religious service: Not on file    Active member of club or organization: Not on file    Attends meetings of clubs or organizations: Not on file    Relationship status: Not on file  Other Topics Concern  . 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 Bupropion Other (See Comments)   Family History  Problem Relation Age of Onset  . Colon cancer Maternal Grandfather   . Hypertension Father   . Irritable bowel syndrome Father     Current Outpatient Medications (Endocrine & Metabolic):  .  metFORMIN (GLUCOPHAGE-XR) 500 MG 24 hr tablet, TAKE 1 TABLET BY MOUTH EVERY DAY FOR 1 WEEK THEN 1 TABLET TWICE A DAY .  PARAGARD INTRAUTERINE COPPER IUD IUD, 1 each by Intrauterine route once.           Current Outpatient Medications (Other):  Marland Kitchen  Diclofenac Sodium (PENNSAID) 2 % SOLN, Place 1 application onto the skin 2 (two) times daily. .  hydrOXYzine (ATARAX/VISTARIL) 25 MG tablet, 25-50 mg Po As needed with flying q 8 hours .   ondansetron (ZOFRAN) 4 MG tablet, Take 1 tablet (4 mg total) by mouth every 8 (eight) hours as needed for nausea or vomiting. .  sertraline (ZOLOFT) 50 MG tablet, Take 75 mg by mouth at bedtime.  .  tizanidine (ZANAFLEX) 2 MG capsule, Take 1 capsule (2 mg total) by mouth at bedtime as needed for muscle spasms.  Current Facility-Administered Medications (Other):  .  0.9 %  sodium chloride infusion    Past medical history, social, surgical and family history all reviewed in electronic medical record.  No pertanent information unless stated regarding to the chief complaint.   Review of Systems:  No headache, visual changes, nausea, vomiting, diarrhea, constipation, dizziness, abdominal pain, skin rash, fevers, chills, night sweats, weight loss, swollen lymph nodes, body aches, joint swelling,  chest pain, shortness of breath, mood changes.  Positive muscle aches  Objective  Blood pressure 106/66, pulse 90, height 5\' 3"  (1.6 m), weight 178 lb (80.7 kg), SpO2 98 %.    General: No apparent distress alert and oriented x3 mood and affect normal, dressed appropriately.  HEENT: Pupils equal, extraocular movements intact  Respiratory: Patient's speak in full sentences and does not appear short of breath  Cardiovascular: No lower extremity edema, non tender, no erythema  Skin: Warm dry intact with no signs of infection or rash on extremities or on axial skeleton.  Abdomen: Soft nontender  Neuro: Cranial nerves II through XII are intact, neurovascularly intact in all extremities with 2+ DTRs and 2+ pulses.  Lymph: No lymphadenopathy of posterior or anterior cervical chain or axillae bilaterally.  Gait normal with good balance and coordination.  MSK:  Non tender with full range of motion and good stability and symmetric strength and tone of shoulders, elbows, wrist, hip, knee and ankles bilaterally.  Neck: Inspection mild loss of lordosis. No palpable stepoffs. Negative Spurling's maneuver. Full  neck range of motion Grip strength and sensation normal in bilateral hands Strength good C4 to T1 distribution No sensory change to C4 to T1 Negative Hoffman sign bilaterally Reflexes normal Scapular dyskinesis noted left greater than right tightness of the left trapezius muscle  Back Exam:  Inspection: Loss of lordosis Motion: Flexion 25 deg, Extension 25 deg, Side Bending to 35 deg bilaterally, Rotation to 35 deg bilaterally  SLR laying: Negative  XSLR laying: Negative  Palpable tenderness: Tender to palpation in the paraspinal musculature.Marland Kitchen FABER: Tightness bilaterally. Sensory change: Gross sensation intact to all lumbar and sacral dermatomes.  Reflexes: 2+ at both patellar tendons, 2+ at achilles tendons, Babinski's downgoing.  Strength at foot  Plantar-flexion: 5/5 Dorsi-flexion: 5/5 Eversion: 5/5 Inversion: 5/5  Leg strength  Quad: 5/5 Hamstring: 5/5 Hip flexor: 5/5 Hip abductors: 5/5    Osteopathic findings C2 flexed rotated and side bent right C7 flexed rotated and side bent left T3 extended rotated and side bent left inhaled third rib T7 extended rotated and side bent left L4 flexed rotated and side bent left  Sacrum right on right    Impression and Recommendations:     This case required medical decision making of moderate complexity. The above documentation has been reviewed and is accurate and complete Judi SaaZachary M Bernadene Garside, DO       Note: This dictation was prepared with Dragon dictation along with smaller phrase technology. Any transcriptional errors that result from this process are unintentional.

## 2018-09-28 ENCOUNTER — Other Ambulatory Visit: Payer: Self-pay

## 2018-09-28 ENCOUNTER — Encounter: Payer: Self-pay | Admitting: Family Medicine

## 2018-09-28 ENCOUNTER — Ambulatory Visit (INDEPENDENT_AMBULATORY_CARE_PROVIDER_SITE_OTHER): Payer: BC Managed Care – PPO | Admitting: Family Medicine

## 2018-09-28 VITALS — BP 106/66 | HR 90 | Ht 63.0 in | Wt 178.0 lb

## 2018-09-28 DIAGNOSIS — G2589 Other specified extrapyramidal and movement disorders: Secondary | ICD-10-CM

## 2018-09-28 DIAGNOSIS — M999 Biomechanical lesion, unspecified: Secondary | ICD-10-CM

## 2018-09-28 NOTE — Patient Instructions (Addendum)
Keep it up  Progress is slow.  Posture and the exercises will be key.  Enjoy the pool See me again in 5-6 weeks

## 2018-09-28 NOTE — Assessment & Plan Note (Signed)
Likely still more scapular dyskinesis.  Patient is making progress but very slowly.  Patient has muscle relaxer to use at bedtime as needed and topical anti-laboratories.  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  Follow-up again in 4 to 8 weeks

## 2018-09-28 NOTE — Assessment & Plan Note (Signed)
Decision today to treat with OMT was based on Physical Exam  After verbal consent patient was treated with HVLA, ME, FPR techniques in cervical, thoracic, rib areas  Patient tolerated the procedure well with improvement in symptoms  Patient given exercises, stretches and lifestyle modifications  See medications in patient instructions if given  Patient will follow up in 4-8 weeks 

## 2018-10-11 ENCOUNTER — Ambulatory Visit: Payer: BC Managed Care – PPO | Admitting: Internal Medicine

## 2018-10-17 NOTE — Progress Notes (Signed)
Virtual Visit via Video Note  I connected with@ on 10/18/18 at  9:00 AM EDT by a video enabled telemedicine application and verified that I am speaking with the correct person using two identifiers. Location patient: home Location provider:work office Persons participating in the virtual visit: patient, provider  [place] national recommendations  regarding COVID 19 pandemic   video visit is advised over in office visit for this patient.  Patient aware  of the limitations of evaluation and management by telemedicine and  availability of in person appointments. and agreed to proceed.   HPI: Sheri [name] presents for video visit  Has been noticing over the past year difficulties with vision   No glasses but using  blue lenses  for screen  Has to magnify and seems to need more light to view things  No double vision  Has some headaches  But not associated and no full LOV    Has been studying for GRE and has to magnify    To read at times  NO major change in meds    Has pcos    ROS: See pertinent positives and negatives per HPI.  Past Medical History:  Diagnosis Date  . Alcohol addiction ([place])   . Anxiety   . Bipolar depression ([place])    under psych rx   . Depression   . Drug addiction in remission [facility])    heroin  . Enteritis due to Norovirus 05/18/2016  . Hyperlipidemia   . Mild mitral regurgitation   . Mild tricuspid regurgitation   . Near syncope 05/16/2016   a. felt due to norovirus.  . Obesity   . PCOS (polycystic ovarian syndrome) 02/13/2015  . Pulmonary embolism ([place]) 2011   neg heme evaluation  felt to be from ocps   . Smoker 12/30/2016    Past Surgical History:  Procedure Laterality Date  . NO PAST SURGERIES      Family History  Problem Relation Age of Onset  . Colon cancer Maternal Grandfather   . Hypertension Father   . Irritable bowel syndrome Father     Social History   Tobacco Use  . Smoking status: Former Smoker    Quit date: 12/20/2015    Years  since quitting: 2.8  . Smokeless tobacco: Never Used  Substance Use Topics  . Alcohol use: No    Alcohol/week: 0.0 standard drinks  . Drug use: No      Current Outpatient Medications:  .  metFORMIN (GLUCOPHAGE-XR) 500 MG 24 hr tablet, TAKE 1 TABLET BY MOUTH EVERY DAY FOR 1 WEEK THEN 1 TABLET TWICE A DAY, Disp: , Rfl: 6 .  PARAGARD INTRAUTERINE COPPER IUD IUD, 1 each by Intrauterine route once., Disp: , Rfl:  .  sertraline (ZOLOFT) 50 MG tablet, Take 75 mg by mouth at bedtime. , Disp: , Rfl:  .  tizanidine (ZANAFLEX) 2 MG capsule, Take 1 capsule (2 mg total) by mouth at bedtime as needed for muscle spasms., Disp: 20 capsule, Rfl: 0 .  Diclofenac Sodium (PENNSAID) 2 % SOLN, Place 1 application onto the skin 2 (two) times daily., Disp: 112 g, Rfl: 2 .  hydrOXYzine (ATARAX/VISTARIL) 25 MG tablet, 25-50 mg Po As needed with flying q 8 hours (Patient not taking: Reported on 10/18/2018), Disp: 15 tablet, Rfl: 0 .  ondansetron (ZOFRAN) 4 MG tablet, Take 1 tablet (4 mg total) by mouth every 8 (eight) hours as needed for nausea or vomiting. (Patient not taking: Reported on 10/18/2018), Disp: 20 tablet, Rfl:  0  Current Facility-Administered Medications:  .  0.9 %  sodium chloride infusion, 500 mL, Intravenous, Continuous, Iva BoopGessner, Carl E, MD  EXAM: BP Readings from Last 3 Encounters:  09/28/18 106/66  08/31/18 116/82  06/28/18 110/80    VITALS per patient if applicable: looks well  GENERAL: alert, oriented, appears well and in no acute distress HEENT: atraumatic, conjunttiva clear, no obvious abnormalities on inspection of external nose and ears NECK: normal movements of the head and neck LUNGS: on inspection no signs of respiratory distress, breathing rate appears normal, no obvious gross SOB, gasping or wheezing CV: no obvious cyanosis MS: moves all visible extremities without noticeable abnormality PSYCH/NEURO: pleasant and cooperative, no obvious depression or anxiety, speech and thought  processing grossly intact Lab Results  Component Value Date   WBC 7.1 07/26/2017   HGB 13.2 07/26/2017   HCT 39.8 07/26/2017   PLT 351.0 07/26/2017   GLUCOSE 81 07/26/2017   CHOL 186 07/26/2017   TRIG 102.0 07/26/2017   HDL 52.00 07/26/2017   LDLDIRECT 150.0 02/13/2015   LDLCALC 113 (H) 07/26/2017   ALT 13 07/26/2017   AST 13 07/26/2017   NA 139 07/26/2017   K 4.5 07/26/2017   CL 104 07/26/2017   CREATININE 0.71 07/26/2017   BUN 10 07/26/2017   CO2 29 07/26/2017   TSH 1.85 07/26/2017   INR 1.03 05/16/2016   HGBA1C 5.5 07/26/2017    ASSESSMENT AND PLAN:  Discussed the following assessment and plan:    ICD-10-CM   1. Vision changes  H53.9 Basic metabolic panel    CBC with Differential/Platelet    Hepatic function panel    Hemoglobin A1c    TSH    T4, free  2. Cervicogenic headache  R51 Basic metabolic panel    CBC with Differential/Platelet    Hepatic function panel    Hemoglobin A1c    TSH    T4, free  3. PCOS (polycystic ovarian syndrome)  E28.2 Basic metabolic panel    CBC with Differential/Platelet    Hepatic function panel    Hemoglobin A1c    TSH    T4, free   R/O metabolic  Get labs done   And  Have her see ophthalmologist  For evaluation.  Counseled.  Get with us if  Cannot get appt   Expectant management and discussion of plan and treatment with opportunity to ask questions and all were answered. The patient agreed with the plan and demonstrated an understanding of the instructions.   Advised to call back or seek an in-person evaluation if worsening  or having  further concerns .  Berniece AndreasWanda Trissa Molina, MD

## 2018-10-18 ENCOUNTER — Encounter: Payer: Self-pay | Admitting: Internal Medicine

## 2018-10-18 ENCOUNTER — Ambulatory Visit (INDEPENDENT_AMBULATORY_CARE_PROVIDER_SITE_OTHER): Payer: BC Managed Care – PPO | Admitting: Internal Medicine

## 2018-10-18 ENCOUNTER — Other Ambulatory Visit: Payer: Self-pay

## 2018-10-18 DIAGNOSIS — R51 Headache: Secondary | ICD-10-CM

## 2018-10-18 DIAGNOSIS — E282 Polycystic ovarian syndrome: Secondary | ICD-10-CM | POA: Diagnosis not present

## 2018-10-18 DIAGNOSIS — H539 Unspecified visual disturbance: Secondary | ICD-10-CM | POA: Diagnosis not present

## 2018-10-18 DIAGNOSIS — G4486 Cervicogenic headache: Secondary | ICD-10-CM

## 2018-10-20 ENCOUNTER — Other Ambulatory Visit: Payer: Self-pay

## 2018-10-20 ENCOUNTER — Other Ambulatory Visit (INDEPENDENT_AMBULATORY_CARE_PROVIDER_SITE_OTHER): Payer: BC Managed Care – PPO

## 2018-10-20 DIAGNOSIS — G4486 Cervicogenic headache: Secondary | ICD-10-CM

## 2018-10-20 DIAGNOSIS — R51 Headache: Secondary | ICD-10-CM

## 2018-10-20 DIAGNOSIS — H539 Unspecified visual disturbance: Secondary | ICD-10-CM | POA: Diagnosis not present

## 2018-10-20 DIAGNOSIS — E282 Polycystic ovarian syndrome: Secondary | ICD-10-CM

## 2018-10-20 LAB — CBC WITH DIFFERENTIAL/PLATELET
Basophils Absolute: 0 10*3/uL (ref 0.0–0.1)
Basophils Relative: 0.5 % (ref 0.0–3.0)
Eosinophils Absolute: 0.2 10*3/uL (ref 0.0–0.7)
Eosinophils Relative: 3 % (ref 0.0–5.0)
HCT: 37.8 % (ref 36.0–46.0)
Hemoglobin: 12.7 g/dL (ref 12.0–15.0)
Lymphocytes Relative: 30 % (ref 12.0–46.0)
Lymphs Abs: 2.1 10*3/uL (ref 0.7–4.0)
MCHC: 33.6 g/dL (ref 30.0–36.0)
MCV: 90.1 fl (ref 78.0–100.0)
Monocytes Absolute: 0.8 10*3/uL (ref 0.1–1.0)
Monocytes Relative: 11 % (ref 3.0–12.0)
Neutro Abs: 3.9 10*3/uL (ref 1.4–7.7)
Neutrophils Relative %: 55.5 % (ref 43.0–77.0)
Platelets: 317 10*3/uL (ref 150.0–400.0)
RBC: 4.2 Mil/uL (ref 3.87–5.11)
RDW: 12.7 % (ref 11.5–15.5)
WBC: 7.1 10*3/uL (ref 4.0–10.5)

## 2018-10-20 LAB — TSH: TSH: 2.16 u[IU]/mL (ref 0.35–4.50)

## 2018-10-20 LAB — HEPATIC FUNCTION PANEL
ALT: 9 U/L (ref 0–35)
AST: 10 U/L (ref 0–37)
Albumin: 4.3 g/dL (ref 3.5–5.2)
Alkaline Phosphatase: 42 U/L (ref 39–117)
Bilirubin, Direct: 0.1 mg/dL (ref 0.0–0.3)
Total Bilirubin: 0.3 mg/dL (ref 0.2–1.2)
Total Protein: 6.9 g/dL (ref 6.0–8.3)

## 2018-10-20 LAB — HEMOGLOBIN A1C: Hgb A1c MFr Bld: 5.5 % (ref 4.6–6.5)

## 2018-10-20 LAB — BASIC METABOLIC PANEL
BUN: 8 mg/dL (ref 6–23)
CO2: 28 mEq/L (ref 19–32)
Calcium: 9.5 mg/dL (ref 8.4–10.5)
Chloride: 104 mEq/L (ref 96–112)
Creatinine, Ser: 0.81 mg/dL (ref 0.40–1.20)
GFR: 83.35 mL/min (ref >60.00)
Glucose, Bld: 83 mg/dL (ref 70–99)
Potassium: 3.7 mEq/L (ref 3.5–5.1)
Sodium: 140 mEq/L (ref 135–145)

## 2018-10-20 LAB — T4, FREE: Free T4: 0.81 ng/dL (ref 0.60–1.60)

## 2018-11-03 ENCOUNTER — Other Ambulatory Visit: Payer: Self-pay

## 2018-11-03 ENCOUNTER — Encounter: Payer: Self-pay | Admitting: Family Medicine

## 2018-11-03 ENCOUNTER — Ambulatory Visit (INDEPENDENT_AMBULATORY_CARE_PROVIDER_SITE_OTHER): Payer: BC Managed Care – PPO | Admitting: Family Medicine

## 2018-11-03 VITALS — BP 102/62 | HR 75 | Ht 63.0 in | Wt 178.0 lb

## 2018-11-03 DIAGNOSIS — M999 Biomechanical lesion, unspecified: Secondary | ICD-10-CM | POA: Diagnosis not present

## 2018-11-03 DIAGNOSIS — G2589 Other specified extrapyramidal and movement disorders: Secondary | ICD-10-CM

## 2018-11-03 NOTE — Assessment & Plan Note (Signed)
I still believe that most patients discomfort and pain seems to be secondary to a scapular dyskinesis and mild slipped rib syndrome.  Do not believe the x-rays with mild degenerative disc disease is playing a significant role in patient's pain at this moment.  We discussed posture and ergonomics, still using the over-the-counter medications and vitamin supplementations.  I believe that patient will continue to improve.  Follow-up again in 4 to 8 weeks

## 2018-11-03 NOTE — Patient Instructions (Signed)
Continue exercises and vitamins See me again for 5 weeks

## 2018-11-03 NOTE — Progress Notes (Signed)
Sheri [name] D.O. [facility] [address] [place], [place] [address] Phone: [phone] Subjective:   I Sheri [name] am serving as a Neurosurgeonscribe for Sheri [name].    CC: Back and neck pain follow-up  BJY:NWGNFAOZHYHPI:Subjective  Sheri [name] is a 28 y.o. female coming in with complaint of back pain. States that she is doing ok today.  Patient states more stiffness recently over the last week.  Patient states that increase activity slowly.  She works out regularly symptoms are better.      Past Medical History:  Diagnosis Date  . Alcohol addiction (HCC)   . Anxiety   . Bipolar depression (HCC)    under psych rx   . Depression   . Drug addiction in remission [facility](HCC)    heroin  . Enteritis due to Norovirus 05/18/2016  . Hyperlipidemia   . Mild mitral regurgitation   . Mild tricuspid regurgitation   . Near syncope 05/16/2016   a. felt due to norovirus.  . Obesity   . PCOS (polycystic ovarian syndrome) 02/13/2015  . Pulmonary embolism (HCC) 2011   neg heme evaluation  felt to be from ocps   . Smoker 12/30/2016   Past Surgical History:  Procedure Laterality Date  . NO PAST SURGERIES     Social History   Socioeconomic History  . Marital status: Married    Spouse name: Not on file  . Number of children: Not on file  . Years of education: Not on file  . Highest education level: Not on file  Occupational History  . Occupation: nanny  Social Needs  . Financial resource strain: Not on file  . Food insecurity    Worry: Not on file    Inability: Not on file  . Transportation needs    Medical: Not on file    Non-medical: Not on file  Tobacco Use  . Smoking status: Former Smoker    Quit date: 12/20/2015    Years since quitting: 2.8  . Smokeless tobacco: Never Used  Substance and Sexual Activity  . Alcohol use: No    Alcohol/week: 0.0 standard drinks  . Drug use: No  . Sexual activity: Not on file  Lifestyle  . Physical activity    Days per week: Not on file     Minutes per session: Not on file  . Stress: Not on file  Relationships  . Social Musicianconnections    Talks on phone: Not on file    Gets together: Not on file    Attends religious service: Not on file    Active member of club or organization: Not on file    Attends meetings of clubs or organizations: Not on file    Relationship status: Not on file  Other Topics Concern  . 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 Bupropion Other (See Comments)   Family History  Problem Relation Age of Onset  . Colon cancer Maternal Grandfather   . Hypertension Father   . Irritable bowel syndrome Father     Current Outpatient Medications (Endocrine & Metabolic):  .  metFORMIN (GLUCOPHAGE-XR)  500 MG 24 hr tablet, TAKE 1 TABLET BY MOUTH EVERY DAY FOR 1 WEEK THEN 1 TABLET TWICE A DAY .  PARAGARD INTRAUTERINE COPPER IUD IUD, 1 each by Intrauterine route once.           Current Outpatient Medications (Other):  .  hydrOXYzine (ATARAX/VISTARIL) 25 MG tablet, 25-50 mg Po As needed with flying q 8 hours .  ondansetron (ZOFRAN) 4 MG tablet, Take 1 tablet (4 mg total) by mouth every 8 (eight) hours as needed for nausea or vomiting. .  sertraline (ZOLOFT) 50 MG tablet, Take 75 mg by mouth at bedtime.  .  tizanidine (ZANAFLEX) 2 MG capsule, Take 1 capsule (2 mg total) by mouth at bedtime as needed for muscle spasms.  Current Facility-Administered Medications (Other):  .  0.9 %  sodium chloride infusion    Past medical history, social, surgical and family history all reviewed in electronic medical record.  No pertanent information unless stated regarding to the chief complaint.   Review of Systems:  No headache, visual  changes, nausea, vomiting, diarrhea, constipation, dizziness, abdominal pain, skin rash, fevers, chills, night sweats, weight loss, swollen lymph nodes, body aches, joint swelling, muscle aches, chest pain, shortness of breath, mood changes.   Objective  Blood pressure 102/62, pulse 75, height 5\' 3"  (1.6 m), weight 178 lb (80.7 kg), SpO2 98 %.   General: No apparent distress alert and oriented x3 mood and affect normal, dressed appropriately.  HEENT: Pupils equal, extraocular movements intact  Respiratory: Patient's speak in full sentences and does not appear short of breath  Cardiovascular: No lower extremity edema, non tender, no erythema  Skin: Warm dry intact with no signs of infection or rash on extremities or on axial skeleton.  Abdomen: Soft nontender  Neuro: Cranial nerves II through XII are intact, neurovascularly intact in all extremities with 2+ DTRs and 2+ pulses.  Lymph: No lymphadenopathy of posterior or anterior cervical chain or axillae bilaterally.  Gait normal with good balance and coordination.  MSK:  Non tender with full range of motion and good stability and symmetric strength and tone of shoulders, elbows, wrist, hip, knee and ankles bilaterally.  Neck: Inspection unremarkable. No palpable stepoffs. Negative Spurling's maneuver. Full neck range of motion Grip strength and sensation normal in bilateral hands Strength good C4 to T1 distribution No sensory change to C4 to T1 Negative Hoffman sign bilaterally Reflexes normal Mild scapular dyskinesis noted  Osteopathic findings C2 flexed rotated and side bent right C4 flexed rotated and side bent left C6 flexed rotated and side bent left T4 extended rotated and side bent right inhaled rib L2 flexed rotated and side bent right Sacrum right on right t    Impression and Recommendations:     This case required medical decision making of moderate complexity. The above documentation has been reviewed and is accurate  and complete Lyndal Pulley, DO       Note: This dictation was prepared with Dragon dictation along with smaller phrase technology. Any transcriptional errors that result from this process are unintentional.

## 2018-11-03 NOTE — Assessment & Plan Note (Signed)
Decision today to treat with OMT was based on Physical Exam  After verbal consent patient was treated with HVLA, ME, FPR techniques in cervical, thoracic, rib,  lumbar and sacral areas  Patient tolerated the procedure well with improvement in symptoms  Patient given exercises, stretches and lifestyle modifications  See medications in patient instructions if given  Patient will follow up in 4-8 weeks 

## 2018-11-16 NOTE — Telephone Encounter (Signed)
It appears that  You are doing the best possible short of isolation.  All care if supportive   At this time for covid   And other  hore  Hospitalized persons.   The good news is that children may  have lower infectivity  .  Hope you remain negative   . And no symptonms   Albeit  Other respiratory infections  Are possible.   No other  intervention except avoidance  At this time and you hx of clotting  Doesn't change  Advice .

## 2018-11-17 ENCOUNTER — Other Ambulatory Visit: Payer: Self-pay

## 2018-11-17 DIAGNOSIS — Z20822 Contact with and (suspected) exposure to covid-19: Secondary | ICD-10-CM

## 2018-11-19 LAB — NOVEL CORONAVIRUS, NAA: SARS-CoV-2, NAA: NOT DETECTED

## 2018-11-24 IMAGING — US US ABDOMEN COMPLETE
1 series · 14 of 25 positions shown · non-contrast
Comparison: None.

CLINICAL DATA: Left upper quadrant pain for 6 months

EXAM:
ABDOMEN ULTRASOUND COMPLETE

[Series 1: us abdomen complete · 0.24mm/px · 14 of 103 slices shown]
[im 1/103]
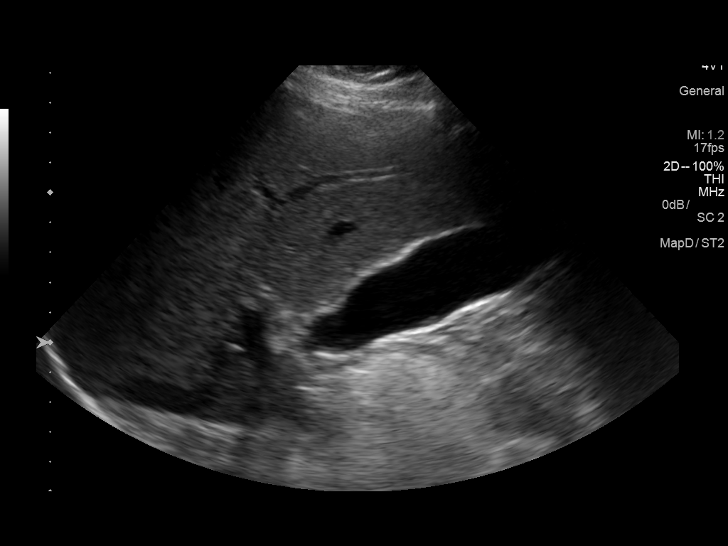
[im 9/103]
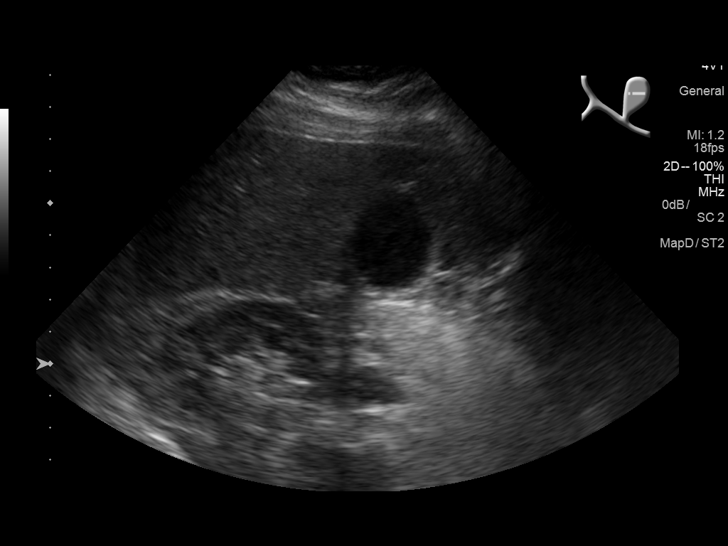
[im 18/103]
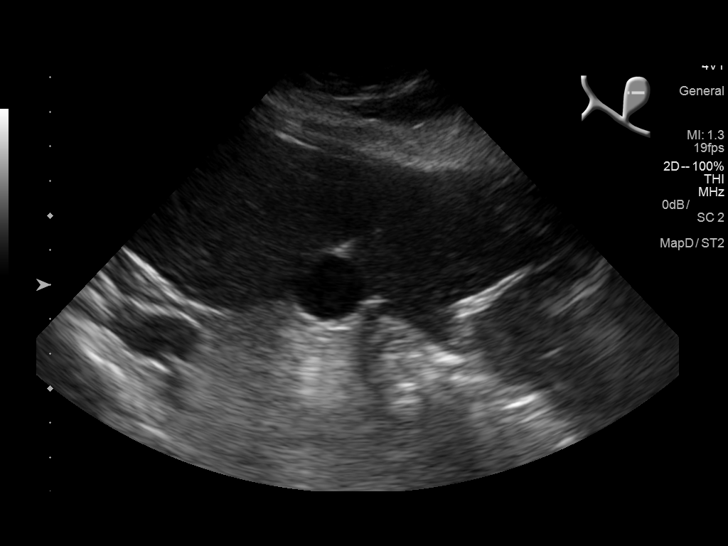
[im 26/103]
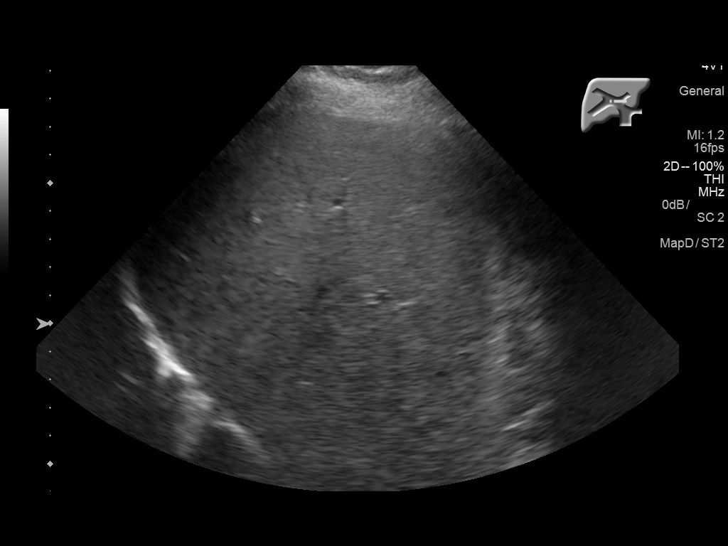
[im 35/103]
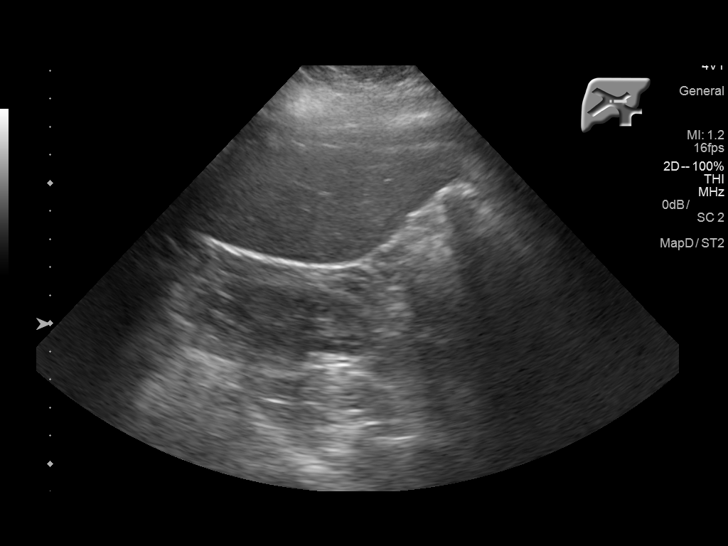
[im 39/103]
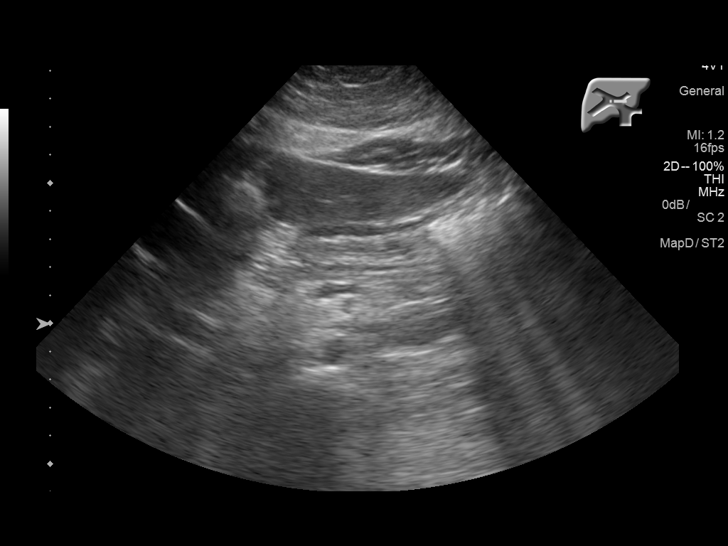
[im 47/103]
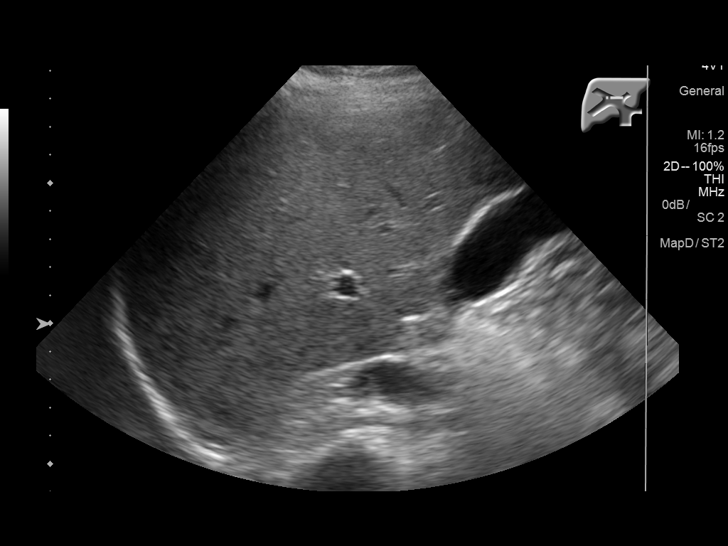
[im 56/103]
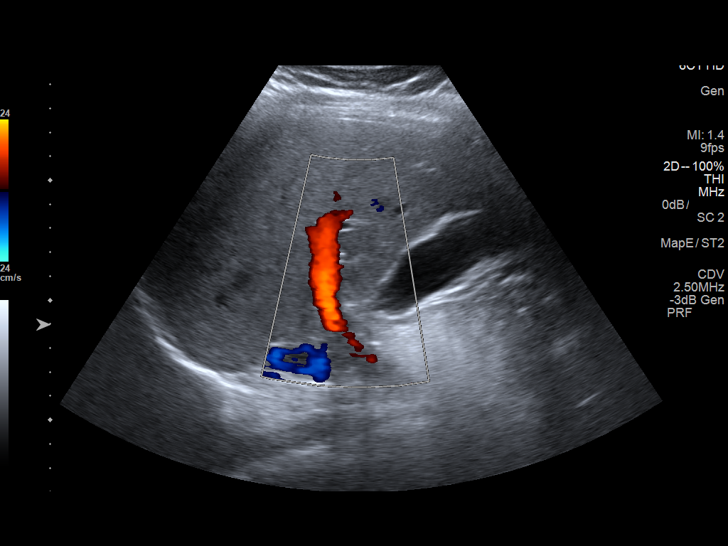
[im 64/103]
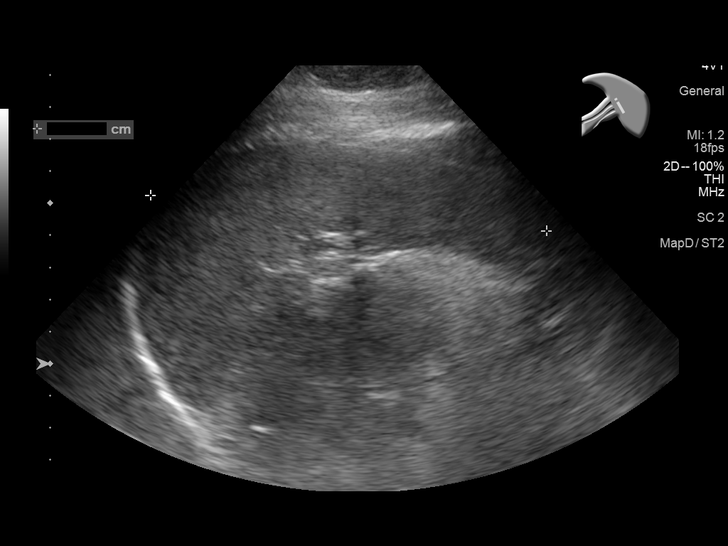
[im 69/103]
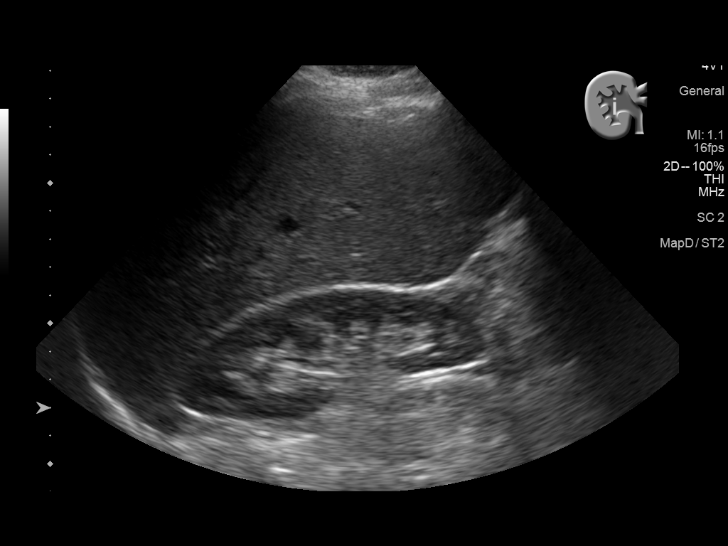
[im 77/103]
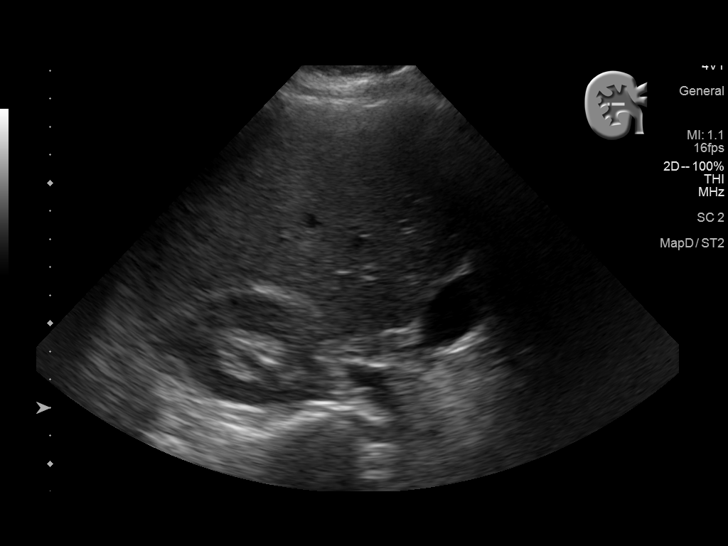
[im 86/103]
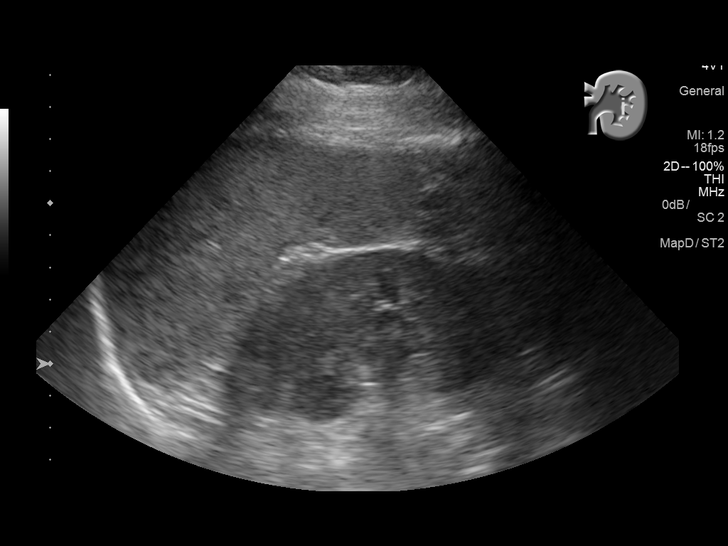
[im 94/103]
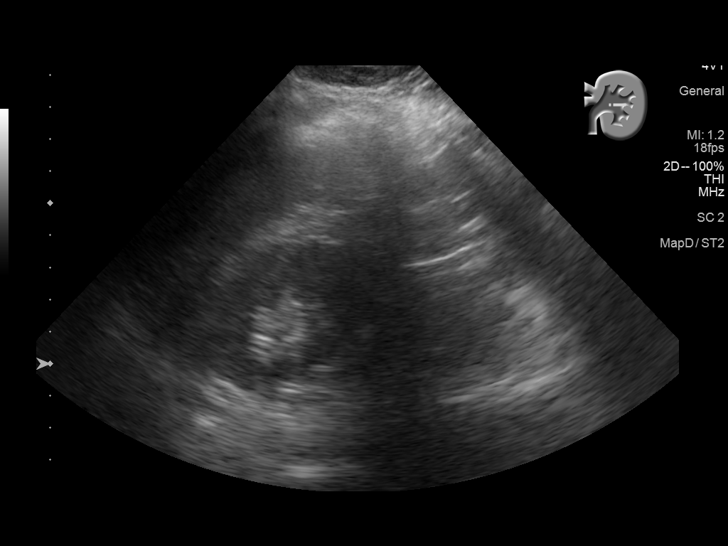
[im 103/103]
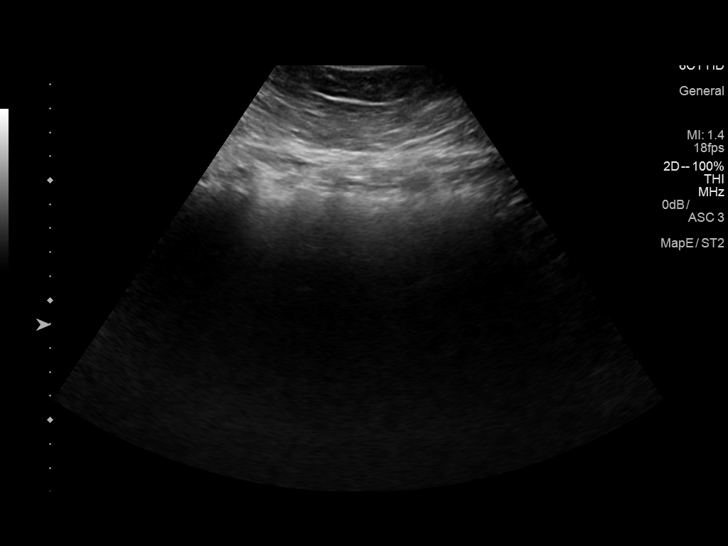

[14 of 25 positions shown; findings below may reference images not displayed]

FINDINGS: Gallbladder: No gallstones or wall thickening visualized. No
sonographic Murphy sign noted by sonographer.

Common bile duct: Diameter: 3 mm in diameter within normal limits

Liver: No focal lesion identified. Within normal limits in
parenchymal echogenicity.

IVC: No abnormality visualized.

Pancreas: Visualized portion unremarkable.

Spleen: Size and appearance within normal limits. Measures 12.4 cm
in length. Splenic volume measures 437.5 cc

Right Kidney: Length: 10.8 cm. Echogenicity within normal limits. No
mass or hydronephrosis visualized.

Left Kidney: Length: 11 cm. Echogenicity within normal limits. No
mass or hydronephrosis visualized.

Abdominal aorta: No aneurysm visualized. Measures up to 1.7 cm in
diameter.

Other findings: None.
IMPRESSION: 1. No gallstones are noted within gallbladder.  Normal CBD.
2. Normal liver echogenicity.
3. Borderline splenomegaly.  The spleen measures 12.4 cm in length.
4. No hydronephrosis.
5. No aortic aneurysm.

## 2018-11-28 ENCOUNTER — Telehealth: Payer: Self-pay

## 2018-11-28 NOTE — Telephone Encounter (Signed)
Sent patient MyChart message for appointment scheduling.

## 2018-12-12 NOTE — Progress Notes (Signed)
[name] [address], [place] [address] Phone: [phone] Subjective:   Sheri [name], am serving as a scribe for Dr. Hulan Saas.  CC: Neck pain and back pain follow-up  [id number]  Sheri [name] is a 28 y.o. female coming in with complaint of neck and back pain.  Patient has had a total time.  Some stiffness.  Denies any radiation down the legs any numbness or tingling.  Patient states he is able to increase activity slowly but continues to have some difficulty.  Patient states it does not stop her from activity and continues to workout on a fairly regular basis.      Past Medical History:  Diagnosis Date  . Alcohol addiction ([place])   . Anxiety   . Bipolar depression ([place])    under psych rx   . Depression   . Drug addiction in remission [facility])    heroin  . Enteritis due to Norovirus 05/18/2016  . Hyperlipidemia   . Mild mitral regurgitation   . Mild tricuspid regurgitation   . Near syncope 05/16/2016   a. felt due to norovirus.  . Obesity   . PCOS (polycystic ovarian syndrome) 02/13/2015  . Pulmonary embolism ([place]) 2011   neg heme evaluation  felt to be from ocps   . Smoker 12/30/2016   Past Surgical History:  Procedure Laterality Date  . [name] PAST SURGERIES     Social History   Socioeconomic History  . Marital status: Married    Spouse name: Not on file  . Number of children: Not on file  . Years of education: Not on file  . Highest education level: Not on file  Occupational History  . Occupation: nanny  Social Needs  . Financial resource strain: Not on file  . Food insecurity    Worry: Not on file    Inability: Not on file  . Transportation needs    Medical: Not on file    Non-medical: Not on file  Tobacco Use  . Smoking status: Former Smoker    Quit date: 12/20/2015    Years since quitting: 2.9  . Smokeless tobacco: Never Used  Substance and Sexual Activity  . Alcohol use: [name]    Alcohol/week: 0.0  standard drinks  . Drug use: [name]  . Sexual activity: Not on file  Lifestyle  . Physical activity    Days per week: Not on file    Minutes per session: Not on file  . Stress: Not on file  Relationships  . Social Herbalist on phone: Not on file    Gets together: Not on file    Attends religious service: Not on file    Active member of club or organization: Not on file    Attends meetings of clubs or organizations: Not on file    Relationship status: Not on file  Other Topics Concern  . 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 Bupropion Other (See Comments)   Family History  Problem Relation Age of Onset  . Colon cancer Maternal Grandfather   .  Hypertension Father   . Irritable bowel syndrome Father     Current Outpatient Medications (Endocrine & Metabolic):  .  metFORMIN (GLUCOPHAGE-XR) 500 MG 24 hr tablet, TAKE 1 TABLET BY MOUTH EVERY DAY FOR 1 WEEK THEN 1 TABLET TWICE A DAY .  PARAGARD INTRAUTERINE COPPER IUD IUD, 1 each by Intrauterine route once.           Current Outpatient Medications (Other):  .  hydrOXYzine (ATARAX/VISTARIL) 25 MG tablet, 25-50 mg Po As needed with flying q 8 hours .  ondansetron (ZOFRAN) 4 MG tablet, Take 1 tablet (4 mg total) by mouth every 8 (eight) hours as needed for nausea or vomiting. .  sertraline (ZOLOFT) 50 MG tablet, Take 75 mg by mouth at bedtime.  .  tizanidine (ZANAFLEX) 2 MG capsule, Take 1 capsule (2 mg total) by mouth at bedtime as needed for muscle spasms.  Current Facility-Administered Medications (Other):  .  0.9 %  sodium chloride infusion    Past medical history, social, surgical and family history all reviewed in electronic  medical record.  [name] unless stated regarding to the chief complaint.   Review of Systems:  [name], visual changes, nausea, vomiting, diarrhea, constipation, dizziness, abdominal pain, skin rash, fevers, chills, night sweats, weight loss, swollen lymph nodes, body aches, joint swelling, muscle aches, chest pain, shortness of breath, mood changes.   Objective  Blood pressure 110/76, pulse 82, height 5\' 3"  (1.6 m), SpO2 97 %.    General: [name] apparent distress alert and oriented x3 mood and affect normal, dressed appropriately.  HEENT: Pupils equal, extraocular movements intact  Respiratory: Patient's speak in full sentences and does not appear short of breath  Cardiovascular: [name], non tender, [name]  Skin: Warm dry intact with [name] signs of infection or rash on extremities or on axial skeleton.  Abdomen: Soft nontender  Neuro: Cranial nerves II through XII are intact, neurovascularly intact in all extremities with 2+ DTRs and 2+ pulses.  Lymph: [name] lymphadenopathy of posterior or anterior cervical chain or axillae bilaterally.  Gait normal with good balance and coordination.  MSK:  Non tender with full range of motion and good stability and symmetric strength and tone of shoulders, elbows, wrist, hip, knee and ankles bilaterally.  Neck exam shows loss of lordosis, tightness of the trapezius bilaterally right greater than left.  Tightness in the periscapular region right greater than left as well.  Osteopathic findings C2 flexed rotated and side bent right C7 flexed rotated and side bent left T3 extended rotated and side bent right inhaled third rib T9 extended rotated and side bent left L2 flexed rotated and side bent right Sacrum right on right     Impression and Recommendations:     This case required medical decision making of moderate complexity. The above documentation has been reviewed and is accurate and complete Sheri SaaZachary M Jamesyn Lindell,  DO       Note: This dictation was prepared with Dragon dictation along with smaller phrase technology. Any transcriptional errors that result from this process are unintentional.

## 2018-12-13 ENCOUNTER — Encounter: Payer: Self-pay | Admitting: Family Medicine

## 2018-12-13 ENCOUNTER — Ambulatory Visit (INDEPENDENT_AMBULATORY_CARE_PROVIDER_SITE_OTHER): Payer: BC Managed Care – PPO | Admitting: Family Medicine

## 2018-12-13 VITALS — BP 110/76 | HR 82 | Ht 63.0 in

## 2018-12-13 DIAGNOSIS — M999 Biomechanical lesion, unspecified: Secondary | ICD-10-CM | POA: Diagnosis not present

## 2018-12-13 DIAGNOSIS — G2589 Other specified extrapyramidal and movement disorders: Secondary | ICD-10-CM | POA: Diagnosis not present

## 2018-12-13 NOTE — Assessment & Plan Note (Signed)
Continue difficulty  Discussed HEP  Discussed which activity to do Responds to OMT  See me again in notes from4-5 weeks

## 2018-12-13 NOTE — Patient Instructions (Signed)
Pelvis forward when lifting Change standing/sitting position every 2 hours Tennis ball in tube sock  See me in 4-5 weeks

## 2018-12-13 NOTE — Assessment & Plan Note (Signed)
Decision today to treat with OMT was based on Physical Exam  After verbal consent patient was treated with HVLA, ME, FPR techniques in cervical, thoracic, rib areas  Patient tolerated the procedure well with improvement in symptoms  Patient given exercises, stretches and lifestyle modifications  See medications in patient instructions if given  Patient will follow up in 4-5 weeks 

## 2019-01-10 ENCOUNTER — Other Ambulatory Visit: Payer: Self-pay

## 2019-01-10 ENCOUNTER — Encounter: Payer: Self-pay | Admitting: Family Medicine

## 2019-01-10 ENCOUNTER — Ambulatory Visit (INDEPENDENT_AMBULATORY_CARE_PROVIDER_SITE_OTHER): Payer: BC Managed Care – PPO | Admitting: Family Medicine

## 2019-01-10 VITALS — BP 100/72 | HR 84 | Ht 63.0 in | Wt 173.0 lb

## 2019-01-10 DIAGNOSIS — M999 Biomechanical lesion, unspecified: Secondary | ICD-10-CM | POA: Diagnosis not present

## 2019-01-10 DIAGNOSIS — G2589 Other specified extrapyramidal and movement disorders: Secondary | ICD-10-CM

## 2019-01-10 NOTE — Assessment & Plan Note (Signed)
Decision today to treat with OMT was based on Physical Exam  After verbal consent patient was treated with HVLA, ME, FPR techniques in cervical, thoracic, rib areas  Patient tolerated the procedure well with improvement in symptoms  Patient given exercises, stretches and lifestyle modifications  See medications in patient instructions if given  Patient will follow up in 4-8 weeks 

## 2019-01-10 NOTE — Progress Notes (Signed)
[name] [name], [place] [address] Phone: [phone] Subjective:   I [name] am serving as a Education administrator for Dr. Hulan Saas.  I'm seeing this patient by the request  of:    CC: Low back pain follow-up  [id number]  Sheri [name] is a 28 y.o. female coming in with complaint of back pain. States she is doing well. OMT. Patient has had neck pain for some time.  Still some frustration secondary to no rhyme or reason when this seems to occur.  Rates the severity of pain is 7 out of 10 sometimes.  Patient states sometimes can stop from activities.    Past Medical History:  Diagnosis Date  . Alcohol addiction ([place])   . Anxiety   . Bipolar depression ([place])    under psych rx   . Depression   . Drug addiction in remission [facility])    heroin  . Enteritis due to Norovirus 05/18/2016  . Hyperlipidemia   . Mild mitral regurgitation   . Mild tricuspid regurgitation   . Near syncope 05/16/2016   a. felt due to norovirus.  . Obesity   . PCOS (polycystic ovarian syndrome) 02/13/2015  . Pulmonary embolism ([place]) 2011   neg heme evaluation  felt to be from ocps   . Smoker 12/30/2016   Past Surgical History:  Procedure Laterality Date  . NO PAST SURGERIES     Social History   Socioeconomic History  . Marital status: Married    Spouse name: Not on file  . Number of children: Not on file  . Years of education: Not on file  . Highest education level: Not on file  Occupational History  . Occupation: nanny  Social Needs  . Financial resource strain: Not on file  . Food insecurity    Worry: Not on file    Inability: Not on file  . Transportation needs    Medical: Not on file    Non-medical: Not on file  Tobacco Use  . Smoking status: Former Smoker    Quit date: 12/20/2015    Years since quitting: 3.0  . Smokeless tobacco: Never Used  Substance and Sexual Activity  . Alcohol use: No    Alcohol/week: 0.0 standard drinks  .  Drug use: No  . Sexual activity: Not on file  Lifestyle  . Physical activity    Days per week: Not on file    Minutes per session: Not on file  . Stress: Not on file  Relationships  . Social Herbalist on phone: Not on file    Gets together: Not on file    Attends religious service: Not on file    Active member of club or organization: Not on file    Attends meetings of clubs or organizations: Not on file    Relationship status: Not on file  Other Topics Concern  . 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 Bupropion Other (See Comments)   Family History  Problem Relation Age of Onset  . Colon cancer Maternal Grandfather   .  Hypertension Father   . Irritable bowel syndrome Father     Current Outpatient Medications (Endocrine & Metabolic):  .  metFORMIN (GLUCOPHAGE-XR) 500 MG 24 hr tablet, TAKE 1 TABLET BY MOUTH EVERY DAY FOR 1 WEEK THEN 1 TABLET TWICE A DAY .  PARAGARD INTRAUTERINE COPPER IUD IUD, 1 each by Intrauterine route once.           Current Outpatient Medications (Other):  .  hydrOXYzine (ATARAX/VISTARIL) 25 MG tablet, 25-50 mg Po As needed with flying q 8 hours .  ondansetron (ZOFRAN) 4 MG tablet, Take 1 tablet (4 mg total) by mouth every 8 (eight) hours as needed for nausea or vomiting. .  sertraline (ZOLOFT) 50 MG tablet, Take 75 mg by mouth at bedtime.  .  tizanidine (ZANAFLEX) 2 MG capsule, Take 1 capsule (2 mg total) by mouth at bedtime as needed for muscle spasms.  Current Facility-Administered Medications (Other):  .  0.9 %  sodium chloride infusion    Past medical history, social, surgical and family history all reviewed in electronic medical record.  No  pertanent information unless stated regarding to the chief complaint.   Review of Systems:  No headache, visual changes, nausea, vomiting, diarrhea, constipation, dizziness, abdominal pain, skin rash, fevers, chills, night sweats, weight loss, swollen lymph nodes, body aches, joint swelling, muscle aches, chest pain, shortness of breath, mood changes.   Objective  Blood pressure 100/72, pulse 84, height 5\' 3"  (1.6 m), weight 173 lb (78.5 kg), SpO2 98 %.    General: No apparent distress alert and oriented x3 mood and affect normal, dressed appropriately.  HEENT: Pupils equal, extraocular movements intact  Respiratory: Patient's speak in full sentences and does not appear short of breath  Cardiovascular: No lower extremity edema, non tender, no erythema  Skin: Warm dry intact with no signs of infection or rash on extremities or on axial skeleton.  Abdomen: Soft nontender  Neuro: Cranial nerves II through XII are intact, neurovascularly intact in all extremities with 2+ DTRs and 2+ pulses.  Lymph: No lymphadenopathy of posterior or anterior cervical chain or axillae bilaterally.  Gait normal with good balance and coordination.  MSK:  Non tender with full range of motion and good stability and symmetric strength and tone of shoulders, elbows, wrist, hip, knee and ankles bilaterally.  Neck exam does have some tightness noted in the paraspinal musculature bilaterally.  Parascapular tightness noted right greater than left.  Mild limited sidebending bilaterally.  Back exam does have poor core strength.  Patient does have tender to palpation diffusely in the paraspinal musculature.  Pain somewhat out of proportion to the amount of palpation.  Pain with test noted.  Osteopathic findings  C2 flexed rotated and side bent right C4 flexed rotated and side bent left C6 flexed rotated and side bent left T3 extended rotated and side bent right inhaled third rib T9 extended rotated and side bent  left     Impression and Recommendations:     This case required medical decision making of moderate complexity. The above documentation has been reviewed and is accurate and complete Pearlean Brownie, DO       Note: This dictation was prepared with Dragon dictation along with smaller phrase technology. Any transcriptional errors that result from this process are unintentional.

## 2019-01-10 NOTE — Patient Instructions (Addendum)
Good to see you  Keep active  I think you are making progress See me again in 7-8 weeks

## 2019-01-10 NOTE — Assessment & Plan Note (Signed)
Continues to have some difficulty.  Does respond fairly well to osteopathic manipulation on.  Discussed posture and ergonomics.  Do believe that the underlying anxiety could be contributing.  Patient is to increase activity slowly over the course the next several weeks.  Follow-up with me again in 4 to 8 weeks

## 2019-01-30 ENCOUNTER — Other Ambulatory Visit: Payer: Self-pay | Admitting: Family Medicine

## 2019-02-10 ENCOUNTER — Ambulatory Visit (HOSPITAL_COMMUNITY)
Admission: EM | Admit: 2019-02-10 | Discharge: 2019-02-10 | Disposition: A | Discharge status: Home / Self Care | Payer: BC Managed Care – PPO | Attending: Emergency Medicine | Admitting: Emergency Medicine

## 2019-02-10 ENCOUNTER — Other Ambulatory Visit: Payer: Self-pay

## 2019-02-10 ENCOUNTER — Encounter (HOSPITAL_COMMUNITY): Payer: Self-pay

## 2019-02-10 ENCOUNTER — Ambulatory Visit: Payer: Self-pay

## 2019-02-10 DIAGNOSIS — H1132 Conjunctival hemorrhage, left eye: Secondary | ICD-10-CM | POA: Diagnosis not present

## 2019-02-10 MED ORDER — POLYMYXIN B-TRIMETHOPRIM 10000-0.1 UNIT/ML-% OP SOLN: 1.0000 [drp] | Freq: Four times a day (QID) | OPHTHALMIC | 0 refills | Status: AC

## 2019-02-10 NOTE — ED Provider Notes (Signed)
[facility]    CSN: [id number] Arrival date & time: 02/10/19  1925      History   Chief Complaint Chief Complaint  Patient presents with  . Eye Problem    HPI Sheri [name] is a 28 y.o. female.   Patient presents with left eye redness and mild discomfort which began when she was hit in the eye with a retractable dog leash this morning.  She denies acute eye pain or changes in her vision.  She denies drainage, fever, chills, or other symptoms.  She took ibuprofen at home which alleviated her discomfort.    The history is provided by the patient.    Past Medical History:  Diagnosis Date  . Alcohol addiction ([facility])   . Anxiety   . Bipolar depression ([facility])    under psych rx   . Depression   . Drug addiction in remission [facility])    heroin  . Enteritis due to Norovirus 05/18/2016  . Hyperlipidemia   . Mild mitral regurgitation   . Mild tricuspid regurgitation   . Near syncope 05/16/2016   a. felt due to norovirus.  . Obesity   . PCOS (polycystic ovarian syndrome) 02/13/2015  . Pulmonary embolism ([facility]) 2011   neg heme evaluation  felt to be from ocps   . Smoker 12/30/2016    Patient Active Problem List   Diagnosis Date Noted  . Scapular dyskinesis 09/01/2018  . Nonallopathic lesion of thoracic region 09/01/2018  . Nonallopathic lesion of rib cage 09/01/2018  . Nonallopathic lesion of cervical region 09/01/2018  . Mitral regurgitation 06/13/2018  . Thoracic outlet syndrome of left thoracic outlet 02/15/2018  . Left shoulder pain 01/11/2018  . Pain in thoracic spine 08/16/2017  . History of drug abuse ([facility]) 12/30/2016  . Thoracic radiculopathy due to degenerative joint disease of spine 10/13/2016  . Orthostatic hypotension 05/18/2016  . Hypokalemia 05/17/2016  . Syncope and collapse 05/16/2016  . PCOS (polycystic ovarian syndrome) 02/13/2015  . IUD (intrauterine device) in place 02/13/2015  . Hx of pulmonary embolus 02/13/2015  . Bipolar depression  ([facility])     Past Surgical History:  Procedure Laterality Date  . NO PAST SURGERIES      OB History    Gravida  0   Para  0   Term  0   Preterm  0   AB  0   Living  0     SAB  0   TAB  0   Ectopic  0   Multiple  0   Live Births               Home Medications    Prior to Admission medications   Medication Sig Start Date End Date Taking? Authorizing Provider  hydrOXYzine (ATARAX/VISTARIL) 25 MG tablet 25-50 mg Po As needed with flying q 8 hours 07/26/17   Panosh, Neta MendsWanda K, MD  metFORMIN (GLUCOPHAGE-XR) 500 MG 24 hr tablet TAKE 1 TABLET BY MOUTH EVERY DAY FOR 1 WEEK THEN 1 TABLET TWICE A DAY 10/05/16   [provider]  ondansetron (ZOFRAN) 4 MG tablet Take 1 tablet (4 mg total) by mouth every 8 (eight) hours as needed for nausea or vomiting. 05/24/18   Bennie PieriniMartin, Mary-Margaret, FNP  PARAGARD INTRAUTERINE COPPER IUD IUD 1 each by Intrauterine route once.    [provider]  sertraline (ZOLOFT) 50 MG tablet Take 75 mg by mouth at bedtime.     [provider]  tizanidine (ZANAFLEX) 2 MG  capsule TAKE 1 CAPSULE (2 MG TOTAL) BY MOUTH AT BEDTIME AS NEEDED FOR MUSCLE SPASMS. 01/31/19   Judi Saa, DO  trimethoprim-polymyxin b (POLYTRIM) ophthalmic solution Place 1 drop into both eyes 4 (four) times daily for 7 days. 02/10/19 02/17/19  Mickie Bail, NP    Family History Family History  Problem Relation Age of Onset  . Colon cancer Maternal Grandfather   . Hypertension Father   . Irritable bowel syndrome Father     Social History Social History   Tobacco Use  . Smoking status: Former Smoker    Quit date: 12/20/2015    Years since quitting: 3.1  . Smokeless tobacco: Never Used  Substance Use Topics  . Alcohol use: No    Alcohol/week: 0.0 standard drinks  . Drug use: No     Allergies   Bupropion   Review of Systems Review of Systems  Constitutional: Negative for chills and fever.  HENT: Negative for ear pain and sore throat.    Eyes: Positive for redness. Negative for pain, discharge and visual disturbance.  Respiratory: Negative for cough and shortness of breath.   Cardiovascular: Negative for chest pain and palpitations.  Gastrointestinal: Negative for abdominal pain and vomiting.  Genitourinary: Negative for dysuria and hematuria.  Musculoskeletal: Negative for arthralgias and back pain.  Skin: Negative for color change and rash.  Neurological: Negative for seizures and syncope.  All other systems reviewed and are negative.    Physical Exam Triage Vital Signs ED Triage Vitals [02/10/19 1952]  Enc Vitals Group     BP      Pulse      Resp      Temp      Temp src      SpO2      Weight      Height      Head Circumference      Peak Flow      Pain Score 0     Pain Loc      Pain Edu?      Excl. in GC?    No data found.  Updated Vital Signs BP 116/60 (BP Location: Right Arm)   Pulse 81   Temp 98.2 F (36.8 C) (Oral)   Resp 16   SpO2 100%   Visual Acuity Right Eye Distance: 20/20 Left Eye Distance: 20/20 Bilateral Distance: 20/20  Right Eye Near:   Left Eye Near:    Bilateral Near:     Physical Exam Vitals signs and nursing note reviewed.  Constitutional:      General: She is not in acute distress.    Appearance: She is well-developed.  HENT:     Head: Normocephalic and atraumatic.     Mouth/Throat:     Mouth: Mucous membranes are moist.  Eyes:     General: Lids are normal. Vision grossly intact.        Right eye: No discharge.        Left eye: No discharge.     Extraocular Movements: Extraocular movements intact.     Conjunctiva/sclera:     Left eye: Left conjunctiva is injected. Hemorrhage present.     Pupils: Pupils are equal, round, and reactive to light.   Neck:     Musculoskeletal: Neck supple.  Cardiovascular:     Rate and Rhythm: Normal rate and regular rhythm.     Heart sounds: No murmur.  Pulmonary:     Effort: Pulmonary effort is normal. No respiratory  distress.  Breath sounds: Normal breath sounds.  Abdominal:     Palpations: Abdomen is soft.     Tenderness: There is no abdominal tenderness.  Skin:    General: Skin is warm and dry.  Neurological:     General: No focal deficit present.     Mental Status: She is alert and oriented to person, place, and time.     Sensory: No sensory deficit.     Motor: No weakness.      UC Treatments / Results  Labs (all labs ordered are listed, but only abnormal results are displayed) Labs Reviewed - No data to display  EKG   Radiology No results found.  Procedures Procedures (including critical care time)  Medications Ordered in UC Medications - No data to display  Initial Impression / Assessment and Plan / UC Course  I have reviewed the triage vital signs and the nursing notes.  Pertinent labs & imaging results that were available during my care of the patient were reviewed by me and considered in my medical decision making (see chart for details).    Subconjunctival hemorrhage of the left eye.  Treating with Polytrim eyedrops.  Instructed patient to go to the emergency department if she has acute pain or changes in her vision.  Instructed her to follow-up with her eye care provider or the one suggested on Monday.  Patient agrees to plan of care.     Final Clinical Impressions(s) / UC Diagnoses   Final diagnoses:  Subconjunctival hemorrhage of left eye     Discharge Instructions     Go to the emergency department if you have acute eye pain or change in your vision.    Use the antibiotic eyedrops as directed.    Follow-up with your eye care provider or the one suggested below on Monday.        ED Prescriptions    Medication Sig Dispense Auth. Provider   trimethoprim-polymyxin b (POLYTRIM) ophthalmic solution Place 1 drop into both eyes 4 (four) times daily for 7 days. 10 mL Sharion Balloon, NP     PDMP not reviewed this encounter.   Sharion Balloon, NP 02/10/19  2014

## 2019-02-10 NOTE — Telephone Encounter (Signed)
Patient called stating that she is out of town and while walking a dog the retractable leash broke and came back hitting her eye.  She states that there is a triangular shaped mark on the white of her eye.  She states her vision is fine.  Her eye is not tearing but there is an irritation when she blinks. Per protocol patient was advised to seek medical care at nearest urgent care or ER. She should not wait. Patient verbalized understanding and will seek care. Note will be routed to office.  Reason for Disposition . Scratch on white of the eye (sclera)  Answer Assessment - Initial Assessment Questions 1. MECHANISM: "How did the injury happen?"      Dog leash snapped 2. ONSET: "When did the injury happen?" (Minutes or hours ago)     1.5 hours ago 3. LOCATION: "What part of the eye is injured?" (cornea, sclera, eyelid, or periorbital tissue)    cornea 4. APPEARANCE: "What does the eye look like?"      tranglar shape scratch 5. VISION: "Is the vision blurred?"      Seems fine 6. PAIN: "Is it painful?" If so, ask: "How bad is the pain?"   (e.g., Scale 1-10; or mild, moderate, severe)    3-4 7. SIZE: For cuts, bruises, or swelling, ask: "How large is it?" (e.g., inches or centimeters)     3/4 centimeter 8. TETANUS: For any breaks in the skin, ask: "When was the last tetanus booster?"    Up to date 9. CONTACTS: "Do you wear contacts?"     no 10. OTHER SYMPTOMS: "Do you have any other symptoms?" (e.g., headache, neck pain, vomiting)    Headache  Nausea right after but has gone away 11. PREGNANCY: "Is there any chance you are pregnant?" "When was your last menstrual period?"      IUD removed last week  Protocols used: EYE INJURY-A-AH

## 2019-02-10 NOTE — Discharge Instructions (Addendum)
Go to the emergency department if you have acute eye pain or change in your vision.    Use the antibiotic eyedrops as directed.    Follow-up with your eye care provider or the one suggested below on Monday.

## 2019-02-10 NOTE — ED Triage Notes (Signed)
Pt states she her left eye started getting red this morning. Pt states she is having discomfort in her left eye.

## 2019-02-24 ENCOUNTER — Other Ambulatory Visit: Payer: Self-pay

## 2019-02-24 DIAGNOSIS — Z20822 Contact with and (suspected) exposure to covid-19: Secondary | ICD-10-CM

## 2019-02-25 LAB — NOVEL CORONAVIRUS, NAA: SARS-CoV-2, NAA: NOT DETECTED

## 2019-03-02 ENCOUNTER — Ambulatory Visit (INDEPENDENT_AMBULATORY_CARE_PROVIDER_SITE_OTHER): Payer: BC Managed Care – PPO | Admitting: Family Medicine

## 2019-03-02 ENCOUNTER — Encounter: Payer: Self-pay | Admitting: Family Medicine

## 2019-03-02 ENCOUNTER — Other Ambulatory Visit: Payer: Self-pay

## 2019-03-02 VITALS — BP 110/84 | HR 89 | Ht 63.0 in | Wt 173.0 lb

## 2019-03-02 DIAGNOSIS — M999 Biomechanical lesion, unspecified: Secondary | ICD-10-CM

## 2019-03-02 DIAGNOSIS — M546 Pain in thoracic spine: Secondary | ICD-10-CM | POA: Diagnosis not present

## 2019-03-02 NOTE — Progress Notes (Signed)
[name] [address] [place], [place] [address] Phone: [phone] Subjective:   Fontaine No, am serving as a scribe for Dr. Hulan Saas.   CC: Low back and neck pain follow-up  WEX:[id number]  Sheri [name] is a 28 y.o. female coming in with complaint of back pain. Last seen on 01/10/2019 for OMT. Patient states that she has been doing well since last visit.  Mild tightness overall but nothing severe.    Past Medical History:  Diagnosis Date  . Alcohol addiction ([place])   . Anxiety   . Bipolar depression ([place])    under psych rx   . Depression   . Drug addiction in remission [facility])    heroin  . Enteritis due to Norovirus 05/18/2016  . Hyperlipidemia   . Mild mitral regurgitation   . Mild tricuspid regurgitation   . Near syncope 05/16/2016   a. felt due to norovirus.  . Obesity   . PCOS (polycystic ovarian syndrome) 02/13/2015  . Pulmonary embolism ([place]) 2011   neg heme evaluation  felt to be from ocps   . Smoker 12/30/2016   Past Surgical History:  Procedure Laterality Date  . NO PAST SURGERIES     Social History   Socioeconomic History  . Marital status: Married    Spouse name: Not on file  . Number of children: Not on file  . Years of education: Not on file  . Highest education level: Not on file  Occupational History  . Occupation: nanny  Social Needs  . Financial resource strain: Not on file  . Food insecurity    Worry: Not on file    Inability: Not on file  . Transportation needs    Medical: Not on file    Non-medical: Not on file  Tobacco Use  . Smoking status: Former Smoker    Quit date: 12/20/2015    Years since quitting: 3.2  . Smokeless tobacco: Never Used  Substance and Sexual Activity  . Alcohol use: No    Alcohol/week: 0.0 standard drinks  . Drug use: No  . Sexual activity: Not on file  Lifestyle  . Physical activity    Days per week: Not on file    Minutes per session: Not on file  . Stress:  Not on file  Relationships  . Social Herbalist on phone: Not on file    Gets together: Not on file    Attends religious service: Not on file    Active member of club or organization: Not on file    Attends meetings of clubs or organizations: Not on file    Relationship status: Not on file  Other Topics Concern  . 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 Bupropion Other (See Comments)   Family History  Problem Relation Age of Onset  . Colon cancer Maternal Grandfather   . Hypertension Father   . Irritable bowel syndrome Father     Current Outpatient Medications (Endocrine & Metabolic):  .  metFORMIN (GLUCOPHAGE-XR) 500 MG 24 hr tablet, TAKE 1 TABLET BY MOUTH  EVERY DAY FOR 1 WEEK THEN 1 TABLET TWICE A DAY .  PARAGARD INTRAUTERINE COPPER IUD IUD, 1 each by Intrauterine route once.           Current Outpatient Medications (Other):  .  ondansetron (ZOFRAN) 4 MG tablet, Take 1 tablet (4 mg total) by mouth every 8 (eight) hours as needed for nausea or vomiting. .  sertraline (ZOLOFT) 50 MG tablet, Take 75 mg by mouth at bedtime.  .  hydrOXYzine (ATARAX/VISTARIL) 25 MG tablet, 25-50 mg Po As needed with flying q 8 hours  Current Facility-Administered Medications (Other):  .  0.9 %  sodium chloride infusion    Past medical history, social, surgical and family history all reviewed in electronic medical record.  No pertanent information unless stated regarding to the chief complaint.   Review of Systems:  No headache, visual changes, nausea, vomiting, diarrhea, constipation, dizziness, abdominal pain, skin rash, fevers, chills, night sweats, weight loss, swollen lymph nodes, body aches,  joint swelling, chest pain, shortness of breath, mood changes.  Positive muscle aches  Objective  Blood pressure 110/84, pulse 89, height 5\' 3"  (1.6 m), weight 173 lb (78.5 kg), SpO2 98 %.    General: No apparent distress alert and oriented x3 mood and affect normal, dressed appropriately.  HEENT: Pupils equal, extraocular movements intact  Respiratory: Patient's speak in full sentences and does not appear short of breath  Cardiovascular: No lower extremity edema, non tender, no erythema  Skin: Warm dry intact with no signs of infection or rash on extremities or on axial skeleton.  Abdomen: Soft nontender  Neuro: Cranial nerves II through XII are intact, neurovascularly intact in all extremities with 2+ DTRs and 2+ pulses.  Lymph: No lymphadenopathy of posterior or anterior cervical chain or axillae bilaterally.  Gait normal with good balance and coordination.  MSK:  Non tender with full range of motion and good stability and symmetric strength and tone of shoulders, elbows, wrist, hip, knee and ankles bilaterally.  Neck: Inspection loss of lordosis. No palpable stepoffs. Negative Spurling's maneuver. Limited range of motion maneuvers to increase with sidebending bilaterally. Grip strength and sensation normal in bilateral hands Strength good C4 to T1 distribution No sensory change to C4 to T1 Negative Hoffman sign bilaterally Reflexes normal Tightness in the trapezius right greater than left  Back Exam:  Inspection: Unremarkable  Motion: Flexion 45 deg, Extension 25 deg, Side Bending to 45 deg bilaterally,  Rotation to 45 deg bilaterally  SLR laying: Negative  XSLR laying: Negative  Palpable tenderness: Very mild discomfort over the left sacroiliac joint. FABER: negative. Sensory change: Gross sensation intact to all lumbar and sacral dermatomes.  Reflexes: 2+ at both patellar tendons, 2+ at achilles tendons, Babinski's downgoing.  Strength at foot  Plantar-flexion: 5/5  Dorsi-flexion: 5/5 Eversion: 5/5 Inversion: 5/5  Leg strength  Quad: 5/5 Hamstring: 5/5 Hip flexor: 5/5 Hip abductors: 5/5  Gait unremarkable.  Osteopathic findings  C4 flexed rotated and side bent left C6 flexed rotated and side bent left T5 extended rotated and side bent right inhaled third rib Sacrum left on left    Impression and Recommendations:     This case required medical decision making of moderate complexity. The above documentation has been reviewed and is accurate and complete , DO       Note: This dictation was prepared with Dragon dictation along with smaller phrase technology. Any transcriptional errors that result from this process are unintentional.

## 2019-03-02 NOTE — Assessment & Plan Note (Signed)
Decision today to treat with OMT was based on Physical Exam  After verbal consent patient was treated with HVLA, ME, FPR techniques in cervical, thoracic,  rib movement areas  Patient tolerated the procedure well with improvement in symptoms  Patient given exercises, stretches and lifestyle modifications  See medications in patient instructions if given  Patient will follow up in 8 weeks

## 2019-03-02 NOTE — Patient Instructions (Signed)
Good to see you  Ice is your friend You are doing amazing  Change nothing  Happy holidays!  See me again in 7-8 weeks

## 2019-03-02 NOTE — Assessment & Plan Note (Signed)
Patient has been doing miraculously well at this time.  Discussed with patient by posture and anomalies, discussed which activities to do which wants to avoid.  Patient is doing very well in school as well.  Working on her masters.  Follow-up with me in 2 months

## 2019-03-10 ENCOUNTER — Other Ambulatory Visit: Payer: Self-pay

## 2019-03-10 DIAGNOSIS — Z20822 Contact with and (suspected) exposure to covid-19: Secondary | ICD-10-CM

## 2019-03-13 LAB — NOVEL CORONAVIRUS, NAA: SARS-CoV-2, NAA: NOT DETECTED

## 2019-04-03 ENCOUNTER — Other Ambulatory Visit: Payer: Self-pay

## 2019-04-03 DIAGNOSIS — Z20822 Contact with and (suspected) exposure to covid-19: Secondary | ICD-10-CM

## 2019-04-04 LAB — NOVEL CORONAVIRUS, NAA: SARS-CoV-2, NAA: NOT DETECTED

## 2019-04-06 LAB — OB RESULTS CONSOLE ANTIBODY SCREEN: Antibody Screen: NEGATIVE

## 2019-04-06 LAB — OB RESULTS CONSOLE ABO/RH: RH Type: POSITIVE

## 2019-04-21 NOTE — L&D Delivery Note (Signed)
Delivery Note At 10:14 AM a viable female was delivered via Vaginal, Spontaneous (Presentation:   LOA   ) loose nuchal x1 reduced.  APGAR: 8, 9; weight  pending.   Placenta status: Spontaneous, Intact.  Cord: 3 vessels with the following complications: None.    Anesthesia: Epidural Episiotomy: None Lacerations: 2nd degree;Perineal Suture Repair: vicryl rapide 3-0 Est. Blood Loss (mL):  250  Mom to postpartum.  Baby to Couplet care / Skin to Skin.  Sheri [name] 12/11/2019, 10:49 AM

## 2019-04-25 ENCOUNTER — Other Ambulatory Visit: Payer: Self-pay

## 2019-04-25 ENCOUNTER — Ambulatory Visit: Payer: BC Managed Care – PPO | Admitting: Family Medicine

## 2019-04-25 NOTE — Progress Notes (Signed)
Patient not seen.

## 2019-05-02 ENCOUNTER — Encounter: Payer: Self-pay | Admitting: Family Medicine

## 2019-05-23 ENCOUNTER — Ambulatory Visit (INDEPENDENT_AMBULATORY_CARE_PROVIDER_SITE_OTHER): Payer: BC Managed Care – PPO | Admitting: Internal Medicine

## 2019-05-23 ENCOUNTER — Other Ambulatory Visit: Payer: Self-pay

## 2019-05-23 ENCOUNTER — Encounter: Payer: Self-pay | Admitting: Internal Medicine

## 2019-05-23 VITALS — BP 116/62 | HR 83 | Temp 97.9°F | Ht 63.0 in | Wt 178.0 lb

## 2019-05-23 DIAGNOSIS — Z331 Pregnant state, incidental: Secondary | ICD-10-CM | POA: Diagnosis not present

## 2019-05-23 DIAGNOSIS — E785 Hyperlipidemia, unspecified: Secondary | ICD-10-CM | POA: Diagnosis not present

## 2019-05-23 DIAGNOSIS — Z0000 Encounter for general adult medical examination without abnormal findings: Secondary | ICD-10-CM

## 2019-05-23 DIAGNOSIS — Z86711 Personal history of pulmonary embolism: Secondary | ICD-10-CM | POA: Diagnosis not present

## 2019-05-23 LAB — CBC WITH DIFFERENTIAL/PLATELET
Basophils Absolute: 0.1 10*3/uL (ref 0.0–0.1)
Basophils Relative: 0.7 % (ref 0.0–3.0)
Eosinophils Absolute: 0.2 10*3/uL (ref 0.0–0.7)
Eosinophils Relative: 2.1 % (ref 0.0–5.0)
HCT: 37.7 % (ref 36.0–46.0)
Hemoglobin: 12.5 g/dL (ref 12.0–15.0)
Lymphocytes Relative: 18.3 % (ref 12.0–46.0)
Lymphs Abs: 1.6 10*3/uL (ref 0.7–4.0)
MCHC: 33.2 g/dL (ref 30.0–36.0)
MCV: 90.4 fl (ref 78.0–100.0)
Monocytes Absolute: 0.7 10*3/uL (ref 0.1–1.0)
Monocytes Relative: 7.5 % (ref 3.0–12.0)
Neutro Abs: 6.3 10*3/uL (ref 1.4–7.7)
Neutrophils Relative %: 71.4 % (ref 43.0–77.0)
Platelets: 296 10*3/uL (ref 150.0–400.0)
RBC: 4.17 Mil/uL (ref 3.87–5.11)
RDW: 13.2 % (ref 11.5–15.5)
WBC: 8.8 10*3/uL (ref 4.0–10.5)

## 2019-05-23 LAB — HEPATIC FUNCTION PANEL
ALT: 10 U/L (ref 0–35)
AST: 11 U/L (ref 0–37)
Albumin: 4 g/dL (ref 3.5–5.2)
Alkaline Phosphatase: 41 U/L (ref 39–117)
Bilirubin, Direct: 0.1 mg/dL (ref 0.0–0.3)
Total Bilirubin: 0.4 mg/dL (ref 0.2–1.2)
Total Protein: 7 g/dL (ref 6.0–8.3)

## 2019-05-23 LAB — HEMOGLOBIN A1C: Hgb A1c MFr Bld: 5.4 % (ref 4.6–6.5)

## 2019-05-23 LAB — LIPID PANEL
Cholesterol: 223 mg/dL — ABNORMAL HIGH (ref 0–200)
HDL: 82.9 mg/dL (ref >39.00)
LDL Cholesterol: 116 mg/dL — ABNORMAL HIGH (ref 0–99)
NonHDL: 140.37
Total CHOL/HDL Ratio: 3
Triglycerides: 122 mg/dL (ref 0.0–149.0)
VLDL: 24.4 mg/dL (ref 0.0–40.0)

## 2019-05-23 LAB — BASIC METABOLIC PANEL
BUN: 8 mg/dL (ref 6–23)
CO2: 26 mEq/L (ref 19–32)
Calcium: 9.5 mg/dL (ref 8.4–10.5)
Chloride: 103 mEq/L (ref 96–112)
Creatinine, Ser: 0.55 mg/dL (ref 0.40–1.20)
GFR: 129.78 mL/min (ref >60.00)
Glucose, Bld: 73 mg/dL (ref 70–99)
Potassium: 4.1 mEq/L (ref 3.5–5.1)
Sodium: 136 mEq/L (ref 135–145)

## 2019-05-23 LAB — TSH: TSH: 2.45 u[IU]/mL (ref 0.35–4.50)

## 2019-05-23 NOTE — Progress Notes (Signed)
This visit occurred during the SARS-CoV-2 public health emergency.  Safety protocols were in place, including screening questions prior to the visit, additional usage of staff PPE, and extensive cleaning of exam room while observing appropriate contact time as indicated for disinfecting solutions.    Chief Complaint  Patient presents with  . Annual Exam    Pt has no other concerns today    HPI: Patient  Sheri [name]  29 y.o. comes in today for Preventive Health Care visit    [redacted] weeks pregnant   So far . Doing well finishing up Public health degree and interested in grad school  On prenatals  Same other meds  Ate last night     Now on lovenox ( past hx of PE)   Health Maintenance  Topic Date Due  . HIV Screening  05/21/2004  . PAP SMEAR-Modifier  05/23/2019 (Originally 12/03/2017)  . TETANUS/TDAP  07/27/2027  . 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 GI /GU: No adominal pain, vomiting, change in bowel habits. No blood in the stool. No significant GU symptoms. SKIN/HEME: ,no acute skin rashes suspicious lesions or bleeding. No lymphadenopathy, nodules, masses.  NEURO/ PSYCH:  No neurologic signs such as weakness numbness. No depression anxiety. IMM/ Allergy: No unusual infections.  Allergy .   REST of 12 system review negative except as per HPI   Past Medical History:  Diagnosis Date  . Alcohol addiction ([place])   . Anxiety   . Bipolar depression ([place])    under psych rx   . Depression   . Drug addiction in remission [facility])    heroin  . Enteritis due to Norovirus 05/18/2016  . Hyperlipidemia   . Mild mitral regurgitation   . Mild tricuspid regurgitation   . Near syncope 05/16/2016   a. felt due to norovirus.  . Obesity   . PCOS (polycystic ovarian syndrome) 02/13/2015  . Pulmonary embolism ([place]) 2011   neg heme evaluation  felt to be from ocps   . Smoker 12/30/2016    Past Surgical History:  Procedure Laterality Date  . NO PAST SURGERIES      Family History  Problem Relation Age of Onset  . Colon cancer Maternal Grandfather   . Hypertension Father   . Irritable bowel syndrome Father       Outpatient Medications Prior to Visit  Medication Sig Dispense Refill  . enoxaparin (LOVENOX) 40 MG/0.4ML injection     . hydrOXYzine (ATARAX/VISTARIL) 25 MG tablet 25-50 mg Po As needed with flying q 8 hours 15 tablet 0  . metFORMIN (GLUCOPHAGE-XR) 500 MG 24 hr tablet TAKE 1 TABLET BY MOUTH EVERY DAY FOR 1 WEEK THEN 1 TABLET TWICE A DAY  6  . ondansetron (ZOFRAN) 4 MG tablet Take 1 tablet (4 mg total) by mouth every 8 (eight) hours as needed for nausea or vomiting. 20 tablet 0  . sertraline (ZOLOFT) 50 MG tablet Take 75 mg by mouth at bedtime.     Marland Kitchen PARAGARD INTRAUTERINE COPPER IUD IUD 1 each  by Intrauterine route once.     Facility-Administered Medications Prior to Visit  Medication Dose Route Frequency Provider Last Rate Last Admin  . 0.9 %  sodium chloride infusion  500 mL Intravenous Continuous Gatha Mayer, MD         EXAM:  BP 116/62 (BP Location: Right Arm, Patient Position: Sitting, Cuff Size: Normal)   Pulse 83   Temp 97.9 F (36.6 C) (Temporal)   Ht '5\' 3"'  (1.6 m)   Wt 178 lb (80.7 kg)   SpO2 98%   BMI 31.53 kg/m   Body mass index is 31.53 kg/m. Wt Readings from Last 3 Encounters:  05/23/19 178 lb (80.7 kg)  04/25/19 173 lb (78.5 kg)  03/02/19 173 lb (78.5 kg)    Physical Exam: Vital signs reviewed VBT:YOMA is a well-developed well-nourished alert cooperative    who appearsr  stated age in no acute distress.  HEENT: normocephalic atraumatic , Eyes: PERRL EOM's full, conjunctiva clear, ., Ears: no deformity EAC's clear TMs with normal landmarks. Mouth:masked NECK: supple without masses, thyromegaly or bruits. CHEST/PULM:  Clear to auscultation and percussion breath sounds equal no wheeze , rales or rhonchi. No chest wall deformities or tenderness. . CV: PMI is nondisplaced, S1 S2 no gallops, murmurs, rubs. Peripheral pulses are full without delay.No JVD .  ABDOMEN: Bowel sounds normal nontender  No guard or rebound, no hepato splenomegal no CVA tenderness.   Extremtities:  No clubbing cyanosis or edema, no acute joint swelling or redness no focal atrophy NEURO:  Oriented x3, cranial nerves 3-12 appear to be intact, no obvious focal weakness,gait within normal limits no abnormal reflexes or asymmetrical SKIN: No acute rashes normal turgor, color, no bruising or petechiae. Mole left chest looks benign PSYCH: Oriented, good eye contact, no obvious depression anxiety, cognition and judgment appear normal. LN: no cervical axillary inguinal adenopathy  Lab Results  Component Value Date   WBC 7.1 10/20/2018   HGB 12.7 10/20/2018   HCT 37.8 10/20/2018   PLT 317.0 10/20/2018   GLUCOSE 83 10/20/2018   CHOL 186 07/26/2017   TRIG 102.0 07/26/2017   HDL 52.00 07/26/2017   LDLDIRECT 150.0 02/13/2015   LDLCALC 113 (H) 07/26/2017   ALT 9 10/20/2018   AST 10 10/20/2018   NA 140 10/20/2018   K 3.7 10/20/2018   CL 104 10/20/2018   CREATININE 0.81 10/20/2018   BUN 8 10/20/2018   CO2 28 10/20/2018   TSH 2.16 10/20/2018   INR 1.03 05/16/2016   HGBA1C 5.5 10/20/2018    BP Readings from Last 3 Encounters:  05/23/19 116/62  04/25/19 126/82  03/02/19 110/84    Lab plan  reviewed with patient   ASSESSMENT AND PLAN:  Discussed the following assessment and plan:    ICD-10-CM   1. Visit for preventive health examination  Y04.59 Basic metabolic panel    CBC with  Differential/Platelet    Hemoglobin A1c    Hepatic function panel    Lipid panel    TSH  2. Hyperlipidemia, unspecified hyperlipidemia type  X77.4 Basic metabolic panel    CBC with Differential/Platelet    Hemoglobin A1c    Hepatic function panel    Lipid panel    TSH  3. Hx of pulmonary embolus  F42.395 Basic metabolic panel    CBC with Differential/Platelet    Hemoglobin A1c    Hepatic function panel    Lipid panel    TSH  4. Pregnancy, incidental  Z33.1   Counseled. Lifestyle  Patient Care Team: Richad Ramsay, Standley Brooking, MD as PCP - General (Internal Medicine) Dorothy Spark, MD as PCP - Cardiology (Cardiology) Aloha Gell, MD as Consulting Physician (Obstetrics and Gynecology) Dorothy Spark, MD as Consulting Physician (Cardiology) Patient Instructions  Clotilde Dieter are doing well  Will notify you  of labs when available.  Will forward to Dr Meda Coffee.    Preventive Care 28-60 Years Old, Female Preventive care refers to visits with your health care provider and lifestyle choices that can promote health and wellness. This includes:  A yearly physical exam. This may also be called an annual well check.  Regular dental visits and eye exams.  Immunizations.  Screening for certain conditions.  Healthy lifestyle choices, such as eating a healthy diet, getting regular exercise, not using drugs or products that contain nicotine and tobacco, and limiting alcohol use. What can I expect for my preventive care visit? Physical exam Your health care provider will check your:  Height and weight. This may be used to calculate body mass index (BMI), which tells if you are at a healthy weight.  Heart rate and blood pressure.  Skin for abnormal spots. Counseling Your health care provider may ask you questions about your:  Alcohol, tobacco, and drug use.  Emotional well-being.  Home and relationship well-being.  Sexual activity.  Eating habits.  Work and work  Statistician.  Method of birth control.  Menstrual cycle.  Pregnancy history. What immunizations do I need?  Influenza (flu) vaccine  This is recommended every year. Tetanus, diphtheria, and pertussis (Tdap) vaccine  You may need a Td booster every 10 years. Varicella (chickenpox) vaccine  You may need this if you have not been vaccinated. Human papillomavirus (HPV) vaccine  If recommended by your health care provider, you may need three doses over 6 months. Measles, mumps, and rubella (MMR) vaccine  You may need at least one dose of MMR. You may also need a second dose. Meningococcal conjugate (MenACWY) vaccine  One dose is recommended if you are age 9-21 years and a first-year college student living in a residence hall, or if you have one of several medical conditions. You may also need additional booster doses. Pneumococcal conjugate (PCV13) vaccine  You may need this if you have certain conditions and were not previously vaccinated. Pneumococcal polysaccharide (PPSV23) vaccine  You may need one or two doses if you smoke cigarettes or if you have certain conditions. Hepatitis A vaccine  You may need this if you have certain conditions or if you travel or work in places where you may be exposed to hepatitis A. Hepatitis B vaccine  You may need this if you have certain conditions or if you travel or work in places where you may be exposed to hepatitis B. Haemophilus influenzae type b (Hib) vaccine  You may need this if you have certain conditions. You may receive vaccines as individual doses or as more than one vaccine together in one shot (combination vaccines). Talk with your health care provider about the risks and benefits of combination vaccines. What tests do I need?  Blood tests  Lipid and cholesterol levels. These may be checked every 5 years starting at age 57.  Hepatitis C test.  Hepatitis B test. Screening  Diabetes screening. This is done by checking  your blood sugar (glucose) after you have not eaten for a while (fasting).  Sexually transmitted disease (STD) testing.  BRCA-related cancer screening. This may be done if you have a family history  of breast, ovarian, tubal, or peritoneal cancers.  Pelvic exam and Pap test. This may be done every 3 years starting at age 27. Starting at age 15, this may be done every 5 years if you have a Pap test in combination with an HPV test. Talk with your health care provider about your test results, treatment options, and if necessary, the need for more tests. Follow these instructions at home: Eating and drinking   Eat a diet that includes fresh fruits and vegetables, whole grains, lean protein, and low-fat dairy.  Take vitamin and mineral supplements as recommended by your health care provider.  Do not drink alcohol if: ? Your health care provider tells you not to drink. ? You are pregnant, may be pregnant, or are planning to become pregnant.  If you drink alcohol: ? Limit how much you have to 0-1 drink a day. ? Be aware of how much alcohol is in your drink. In the [place] one drink equals one 12 oz bottle of beer (355 mL), one 5 oz glass of wine (148 mL), or one 1 oz glass of hard liquor (44 mL). Lifestyle  Take daily care of your teeth and gums.  Stay active. Exercise for at least 30 minutes on 5 or more days each week.  Do not use any products that contain nicotine or tobacco, such as cigarettes, e-cigarettes, and chewing tobacco. If you need help quitting, ask your health care provider.  If you are sexually active, practice safe sex. Use a condom or other form of birth control (contraception) in order to prevent pregnancy and STIs (sexually transmitted infections). If you plan to become pregnant, see your health care provider for a preconception visit. What's next?  Visit your health care provider once a year for a well check visit.  Ask your health care provider how often you should  have your eyes and teeth checked.  Stay up to date on all vaccines. This information is not intended to replace advice given to you by your health care provider. Make sure you discuss any questions you have with your health care provider. Document Revised: 12/16/2017 Document Reviewed: 12/16/2017 Elsevier Patient Education  2020 [employer] Athanasius Kesling M.D.

## 2019-05-23 NOTE — Patient Instructions (Signed)
Glad  You are doing well  Will notify you  of labs when available.  Will forward to Dr Meda Coffee.    Preventive Care 18-30 Years Old, Female Preventive care refers to visits with your health care provider and lifestyle choices that can promote health and wellness. This includes:  A yearly physical exam. This may also be called an annual well check.  Regular dental visits and eye exams.  Immunizations.  Screening for certain conditions.  Healthy lifestyle choices, such as eating a healthy diet, getting regular exercise, not using drugs or products that contain nicotine and tobacco, and limiting alcohol use. What can I expect for my preventive care visit? Physical exam Your health care provider will check your:  Height and weight. This may be used to calculate body mass index (BMI), which tells if you are at a healthy weight.  Heart rate and blood pressure.  Skin for abnormal spots. Counseling Your health care provider may ask you questions about your:  Alcohol, tobacco, and drug use.  Emotional well-being.  Home and relationship well-being.  Sexual activity.  Eating habits.  Work and work Statistician.  Method of birth control.  Menstrual cycle.  Pregnancy history. What immunizations do I need?  Influenza (flu) vaccine  This is recommended every year. Tetanus, diphtheria, and pertussis (Tdap) vaccine  You may need a Td booster every 10 years. Varicella (chickenpox) vaccine  You may need this if you have not been vaccinated. Human papillomavirus (HPV) vaccine  If recommended by your health care provider, you may need three doses over 6 months. Measles, mumps, and rubella (MMR) vaccine  You may need at least one dose of MMR. You may also need a second dose. Meningococcal conjugate (MenACWY) vaccine  One dose is recommended if you are age 89-21 years and a first-year college student living in a residence hall, or if you have one of several medical conditions.  You may also need additional booster doses. Pneumococcal conjugate (PCV13) vaccine  You may need this if you have certain conditions and were not previously vaccinated. Pneumococcal polysaccharide (PPSV23) vaccine  You may need one or two doses if you smoke cigarettes or if you have certain conditions. Hepatitis A vaccine  You may need this if you have certain conditions or if you travel or work in places where you may be exposed to hepatitis A. Hepatitis B vaccine  You may need this if you have certain conditions or if you travel or work in places where you may be exposed to hepatitis B. Haemophilus influenzae type b (Hib) vaccine  You may need this if you have certain conditions. You may receive vaccines as individual doses or as more than one vaccine together in one shot (combination vaccines). Talk with your health care provider about the risks and benefits of combination vaccines. What tests do I need?  Blood tests  Lipid and cholesterol levels. These may be checked every 5 years starting at age 48.  Hepatitis C test.  Hepatitis B test. Screening  Diabetes screening. This is done by checking your blood sugar (glucose) after you have not eaten for a while (fasting).  Sexually transmitted disease (STD) testing.  BRCA-related cancer screening. This may be done if you have a family history of breast, ovarian, tubal, or peritoneal cancers.  Pelvic exam and Pap test. This may be done every 3 years starting at age 49. Starting at age 89, this may be done every 5 years if you have a Pap test in combination  with an HPV test. Talk with your health care provider about your test results, treatment options, and if necessary, the need for more tests. Follow these instructions at home: Eating and drinking   Eat a diet that includes fresh fruits and vegetables, whole grains, lean protein, and low-fat dairy.  Take vitamin and mineral supplements as recommended by your health care  provider.  Do not drink alcohol if: ? Your health care provider tells you not to drink. ? You are pregnant, may be pregnant, or are planning to become pregnant.  If you drink alcohol: ? Limit how much you have to 0-1 drink a day. ? Be aware of how much alcohol is in your drink. In the [place] one drink equals one 12 oz bottle of beer (355 mL), one 5 oz glass of wine (148 mL), or one 1 oz glass of hard liquor (44 mL). Lifestyle  Take daily care of your teeth and gums.  Stay active. Exercise for at least 30 minutes on 5 or more days each week.  Do not use any products that contain nicotine or tobacco, such as cigarettes, e-cigarettes, and chewing tobacco. If you need help quitting, ask your health care provider.  If you are sexually active, practice safe sex. Use a condom or other form of birth control (contraception) in order to prevent pregnancy and STIs (sexually transmitted infections). If you plan to become pregnant, see your health care provider for a preconception visit. What's next?  Visit your health care provider once a year for a well check visit.  Ask your health care provider how often you should have your eyes and teeth checked.  Stay up to date on all vaccines. This information is not intended to replace advice given to you by your health care provider. Make sure you discuss any questions you have with your health care provider. Document Revised: 12/16/2017 Document Reviewed: 12/16/2017 Elsevier Patient Education  2020 [employer].

## 2019-05-25 LAB — OB RESULTS CONSOLE RUBELLA ANTIBODY, IGM: Rubella: IMMUNE

## 2019-05-25 LAB — OB RESULTS CONSOLE HEPATITIS B SURFACE ANTIGEN: Hepatitis B Surface Ag: NEGATIVE

## 2019-05-25 LAB — OB RESULTS CONSOLE HIV ANTIBODY (ROUTINE TESTING): HIV: NONREACTIVE

## 2019-05-25 LAB — OB RESULTS CONSOLE RPR: RPR: NONREACTIVE

## 2019-05-25 LAB — OB RESULTS CONSOLE GC/CHLAMYDIA
Chlamydia: NEGATIVE
Gonorrhea: NEGATIVE

## 2019-06-06 ENCOUNTER — Ambulatory Visit: Payer: BC Managed Care – PPO | Admitting: Physician Assistant

## 2019-06-07 ENCOUNTER — Ambulatory Visit: Payer: BC Managed Care – PPO | Attending: Internal Medicine

## 2019-06-07 DIAGNOSIS — Z20822 Contact with and (suspected) exposure to covid-19: Secondary | ICD-10-CM

## 2019-06-07 NOTE — Progress Notes (Deleted)
Cardiology Office Note    Date:  06/07/2019   ID:  Sheri [name], DOB 10/22/1990, MRN [id number]  PCP:  Burnis Medin, MD  Cardiologist: Ena Dawley, MD EPS: None  No chief complaint on file.   History of Present Illness:  Sheri [name] is a 29 y.o. female with history of pulmonary embolus in 2011 associated with oral contraceptives, history of syncope in 2018 in the setting of norovirus felt secondary to dehydration and hypotension.  CT angiogram 05/16/2016- for PE, 2D echo at that time normal LVEF 60 to 65% with mild MR/TR.  Also has history of bipolar disorder, mild HLD, depression, prior heroin addiction.   I last saw the patient 06/14/2018 at which time she was having occasional palpitations that were self-limiting.  She was exercising regularly without any problem.    Past Medical History:  Diagnosis Date  . Alcohol addiction ([place])   . Anxiety   . Bipolar depression ([place])    under psych rx   . Depression   . Drug addiction in remission [facility])    heroin  . Enteritis due to Norovirus 05/18/2016  . Hyperlipidemia   . Mild mitral regurgitation   . Mild tricuspid regurgitation   . Near syncope 05/16/2016   a. felt due to norovirus.  . Obesity   . PCOS (polycystic ovarian syndrome) 02/13/2015  . Pulmonary embolism ([place]) 2011   neg heme evaluation  felt to be from ocps   . Smoker 12/30/2016    Past Surgical History:  Procedure Laterality Date  . NO PAST SURGERIES      Current Medications: No outpatient medications have been marked as taking for the 06/20/19 encounter (Appointment) with Imogene Burn, PA-C.     Allergies:   Bupropion   Social History   Socioeconomic History  . Marital status: Married    Spouse name: Not on file  . Number of children: Not on file  . Years of education: Not on file  . Highest education level: Not on file  Occupational History  . Occupation: nanny  Tobacco Use  . Smoking status: Former Smoker    Quit date:  12/20/2015    Years since quitting: 3.4  . Smokeless tobacco: Never Used  Substance and Sexual Activity  . Alcohol use: No    Alcohol/week: 0.0 standard drinks  . Drug use: No  . Sexual activity: Not on file  Other Topics Concern  . 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 Difficulty of Paying Living Expenses: Not on file  Food Insecurity:   . Worried About Charity fundraiser in the Last Year: Not on file  . Ran Out of Food in the Last Year: Not on file  Transportation Needs:   . Lack of Transportation (Medical): Not on file  . Lack of Transportation (Non-Medical): Not on file  Physical Activity:   . Days of Exercise per Week: Not on file  . Minutes of Exercise per Session: Not on file  Stress:   . Feeling of Stress : Not on file  Social  Connections:   . Frequency of Communication with Friends and Family: Not on file  . Frequency of Social Gatherings with Friends and Family: Not on file  . Attends Religious Services: Not on file  . Active Member of Clubs or Organizations: Not on file  . Attends Banker Meetings: Not on file  . Marital Status: Not on file     Family History:  The patient's ***family history includes Colon cancer in her maternal grandfather; Hypertension in her father; Irritable bowel syndrome in her father.   ROS:   Please see the history of present illness.    ROS All other systems reviewed and are negative.   PHYSICAL EXAM:   VS:  There were no vitals taken for this visit.  Physical Exam  GEN: Well nourished, well developed, in no acute distress  HEENT: normal  Neck: no JVD, carotid bruits, or masses Cardiac:RRR; no murmurs, rubs,  or gallops  Respiratory:  clear to auscultation bilaterally, normal work of breathing GI: soft, nontender, nondistended, + BS Ext: without cyanosis, clubbing, or edema, Good distal pulses bilaterally MS: no deformity or atrophy  Skin: warm and dry, no rash Neuro:  Alert and Oriented x 3, Strength and sensation are intact Psych: euthymic mood, full affect  Wt Readings from Last 3 Encounters:  05/23/19 178 lb (80.7 kg)  04/25/19 173 lb (78.5 kg)  03/02/19 173 lb (78.5 kg)      Studies/Labs Reviewed:   EKG:  EKG is*** ordered today.  The ekg ordered today demonstrates ***  Recent Labs: 05/23/2019: ALT 10; BUN 8; Creatinine, Ser 0.55; Hemoglobin 12.5; Platelets 296.0; Potassium 4.1; Sodium 136; TSH 2.45   Lipid Panel    Component Value Date/Time   CHOL 223 (H) 05/23/2019 0940   TRIG 122.0 05/23/2019 0940   HDL 82.90 05/23/2019 0940   CHOLHDL 3 05/23/2019 0940   VLDL 24.4 05/23/2019 0940   LDLCALC 116 (H) 05/23/2019 0940   LDLDIRECT 150.0 02/13/2015 1449    Additional studies/ records that were reviewed today include:  2D echo 04/2016 Study Conclusions   - Left ventricle: The cavity size was normal. Wall thickness was   increased in a pattern of mild LVH. Systolic function was normal.   The estimated ejection fraction was in the range of 60% to 65%.   Wall motion was normal; there were no regional wall motion   abnormalities. Left ventricular diastolic function parameters   were normal. - Mitral valve: There was mild regurgitation. - Atrial septum: No defect or patent foramen ovale was identified. - Tricuspid valve: There was mild regurgitation. - Pulmonary arteries: PA peak pressure: 36 mm Hg (S).           ASSESSMENT:    No diagnosis found.   PLAN:  In order of problems listed above:  History of pulmonary embolus in 2011 felt secondary to oral contraceptives  History of syncope felt secondary to dehydration from norovirus 2018 no recurrence   Mild MR on  echo in 2018 minimal murmur on exam.     Palpitations self-limiting.           Medication Adjustments/Labs and Tests Ordered: Current medicines are reviewed at length with the patient today.  Concerns regarding medicines are outlined above.  Medication changes, Labs and Tests ordered today are listed in the Patient Instructions below. There are no Patient Instructions on file for this visit.   Elson Clan, PA-C  06/07/2019 12:47 PM     Medical Group  [address], [place], [place]  [address] Phone: [phone]; Fax: [phone]

## 2019-06-08 LAB — NOVEL CORONAVIRUS, NAA: SARS-CoV-2, NAA: NOT DETECTED

## 2019-06-12 ENCOUNTER — Ambulatory Visit: Payer: BC Managed Care – PPO | Attending: Internal Medicine

## 2019-06-12 DIAGNOSIS — Z20822 Contact with and (suspected) exposure to covid-19: Secondary | ICD-10-CM

## 2019-06-13 LAB — NOVEL CORONAVIRUS, NAA: SARS-CoV-2, NAA: NOT DETECTED

## 2019-06-16 ENCOUNTER — Telehealth: Payer: Self-pay | Admitting: Internal Medicine

## 2019-06-16 NOTE — Telephone Encounter (Signed)
Pt has been notified and no further questions

## 2019-06-16 NOTE — Telephone Encounter (Signed)
Pt would like advice on what she should do with covid exposure. Pt stepson tested positive and pt and husband tested negative. Doreene Adas was in the home with pt he is no longer. Pt is pregnant and wants to know if she should still isolate. Also she goes for covid vaccine tomorrow should she still go or reschedule. Pt also wants to know if she should isolate or wear mask with husband. Pt would like a call back.   Patient Phone: [phone]

## 2019-06-16 NOTE — Telephone Encounter (Signed)
So basically I advise get vaccine unless   Having symptomatic active covid infection .  If gets sx  From vaccine they usually last 1-3 days    So isolate in case  .  Usually take 2 weeks to get immunity   Not sure about the isolation depending on  When exposure how intense and when covid screen was done    If neg testing  Days? 3 or more?  After  Exposure and NO SYMPTOMS    10 days( maybe 7)  is plenty isolation /quarantine.   In abundance of caution would mask and isolate until after  Sure no infection  Has developed  And no sx  .

## 2019-06-16 NOTE — Telephone Encounter (Signed)
Please advise 

## 2019-06-20 ENCOUNTER — Ambulatory Visit: Payer: BC Managed Care – PPO | Admitting: Physician Assistant

## 2019-06-21 ENCOUNTER — Ambulatory Visit: Payer: BC Managed Care – PPO | Attending: Internal Medicine

## 2019-06-21 DIAGNOSIS — Z20822 Contact with and (suspected) exposure to covid-19: Secondary | ICD-10-CM

## 2019-06-22 LAB — NOVEL CORONAVIRUS, NAA: SARS-CoV-2, NAA: NOT DETECTED

## 2019-07-05 NOTE — Progress Notes (Addendum)
Cardiology Office Note    Date:  07/11/2019   ID:  Sheri [name], DOB 16-Dec-1990, MRN [id number]  PCP:  Burnis Medin, MD  Cardiologist: Ena Dawley, MD EPS: None  Chief Complaint  Patient presents with  . Follow-up    History of Present Illness:  Sheri [name] is a 30 y.o. female with history of pulmonary embolus in 2011 associated with oral contraceptives, history of syncope in 2018 in the setting of norovirus felt secondary to dehydration and hypotension. CT angiogram 05/16/2016- for PE, 2D echo at that time normal LVEF 60 to 65% with mild MR/TR. Also has history of bipolar disorder, mild HLD, depression, prior heroin addiction.   I saw the patient 06/14/2018 at which time she was having a occasional palpitations at rest but overall doing well.  Patient comes in for f/u. She's [redacted] weeks pregnant and has had a lot of morning sickness. Has had more shortness of breath. Her ASA was stopped and she is on Lovenox. Wants to know if she can push with her delivery.   Past Medical History:  Diagnosis Date  . Alcohol addiction ([place])   . Anxiety   . Bipolar depression ([place])    under psych rx   . Depression   . Drug addiction in remission [facility])    heroin  . Enteritis due to Norovirus 05/18/2016  . Hx of pulmonary embolus 02/13/2015  . Hyperlipidemia   . Hypokalemia 05/17/2016  . IUD (intrauterine device) in place 02/13/2015  . Mild mitral regurgitation   . Mild tricuspid regurgitation   . Mitral regurgitation 06/13/2018  . Near syncope 05/16/2016   a. felt due to norovirus.  . Nonallopathic lesion of cervical region 09/01/2018  . Nonallopathic lesion of rib cage 09/01/2018  . Obesity   . Orthostatic hypotension 05/18/2016  . PCOS (polycystic ovarian syndrome) 02/13/2015  . Pulmonary embolism ([place]) 2011   neg heme evaluation  felt to be from ocps   . Scapular dyskinesis 09/01/2018  . Smoker 12/30/2016  . Syncope and collapse 05/16/2016  . Thoracic outlet syndrome of  left thoracic outlet 02/15/2018  . Thoracic radiculopathy due to degenerative joint disease of spine 10/13/2016   CT and XR show at least 3 levels of DDD of thoracic spine  I suspect this is related to both the scoliosis and the history of dance    Past Surgical History:  Procedure Laterality Date  . NO PAST SURGERIES      Current Medications: Current Meds  Medication Sig  . amoxicillin (AMOXIL) 500 MG capsule Take 500 mg by mouth 3 (three) times daily.  . clotrimazole (LOTRIMIN AF) 1 % cream Lotrimin AF (clotrimazole) 1 % topical cream  APPLY TO THE AFFECTED AND SURROUNDING AREAS OF SKIN BY TOPICAL ROUTE 2 TIMES PER DAY IN THE MORNING AND EVENING  . enoxaparin (LOVENOX) 40 MG/0.4ML injection Inject 40 mg into the skin daily.   . hydrOXYzine (ATARAX/VISTARIL) 25 MG tablet 25-50 mg Po As needed with flying q 8 hours  . metFORMIN (GLUCOPHAGE-XR) 500 MG 24 hr tablet Take 500 mg by mouth in the morning and at bedtime.  Marland Kitchen nystatin-triamcinolone ointment (MYCOLOG) Apply 1 application topically 2 (two) times daily.  . sertraline (ZOLOFT) 50 MG tablet Take 75 mg by mouth at bedtime.   Marland Kitchen terconazole (TERAZOL 7) 0.4 % vaginal cream Place 1 applicator vaginally at bedtime.     Allergies:   Bupropion   Social History   Socioeconomic History  . Marital status:  Married    Spouse name: Not on file  . Number of children: Not on file  . Years of education: Not on file  . Highest education level: Not on file  Occupational History  . Occupation: nanny  Tobacco Use  . Smoking status: Former Smoker    Quit date: 12/20/2015    Years since quitting: 3.5  . Smokeless tobacco: Never Used  Substance and Sexual Activity  . Alcohol use: No    Alcohol/week: 0.0 standard drinks  . Drug use: No  . Sexual activity: Not on file  Other Topics Concern  . 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 Difficulty of Paying Living Expenses:   Food Insecurity:   . Worried About Programme researcher, broadcasting/film/video in the Last Year:   . Barista in the Last Year:   Transportation Needs:   . Freight forwarder (Medical):   Marland Kitchen Lack of Transportation (Non-Medical):   Physical Activity:   . Days of Exercise per Week:   . Minutes of Exercise per Session:   Stress:   . Feeling of Stress :   Social Connections:   . Frequency of Communication with Friends and Family:   . Frequency of Social Gatherings with Friends and Family:   . Attends Religious Services:   . Active Member of Clubs or Organizations:   . Attends Banker Meetings:   Marland Kitchen Marital Status:      Family History:  The patient's  family history includes Colon cancer in her maternal grandfather; Hypertension in her father; Irritable bowel syndrome in her father.   ROS:   Please see the history of present illness.    ROS All other systems reviewed and are negative.   PHYSICAL EXAM:   VS:  BP 116/70   Pulse 100   Ht 5\' 3"  (1.6 m)   Wt 189 lb 3.2 oz (85.8 kg)   SpO2 98%   BMI 33.52 kg/m   Physical Exam  GEN: Well nourished, well developed, in no acute distress  Neck: no JVD, carotid bruits, or masses Cardiac:RRR; no murmurs, rubs, or gallops  Respiratory:  clear to auscultation bilaterally, normal work of breathing GI: soft, nontender, nondistended, + BS Ext: without cyanosis, clubbing, or edema, Good distal pulses bilaterally Neuro:  Alert and Oriented x 3 Psych: euthymic mood, full affect  Wt Readings from Last 3 Encounters:  07/11/19 189 lb 3.2 oz (85.8 kg)  05/23/19 178 lb (80.7 kg)  04/25/19 173 lb (78.5 kg)      Studies/Labs Reviewed:   EKG:  EKG is  ordered today.  The ekg ordered today demonstrates  NSR no change  Recent Labs: 05/23/2019: ALT 10; BUN 8; Creatinine, Ser 0.55; Hemoglobin 12.5; Platelets 296.0; Potassium 4.1; Sodium 136; TSH 2.45   Lipid Panel    Component Value Date/Time   CHOL 223 (H) 05/23/2019 0940   TRIG 122.0 05/23/2019 0940   HDL 82.90 05/23/2019 0940   CHOLHDL 3 05/23/2019 0940   VLDL 24.4 05/23/2019 0940   LDLCALC 116 (H) 05/23/2019 0940  LDLDIRECT 150.0 02/13/2015 1449    Additional studies/ records that were reviewed today include:  2D echo 04/2016 Study Conclusions   - Left ventricle: The cavity size was normal. Wall thickness was   increased in a pattern of mild LVH. Systolic function was normal.   The estimated ejection fraction was in the range of 60% to 65%.   Wall motion was normal; there were no regional wall motion   abnormalities. Left ventricular diastolic function parameters   were normal. - Mitral valve: There was mild regurgitation. - Atrial septum: No defect or patent foramen ovale was identified. - Tricuspid valve: There was mild regurgitation. - Pulmonary arteries: PA peak pressure: 36 mm Hg (S).           ASSESSMENT:    1. Hx of pulmonary embolus   2. History of syncope   3. Mitral valve insufficiency, unspecified etiology   4. Palpitations      PLAN:  In order of problems listed above:  History of pulmonary embolus in 2011 felt secondary to oral contraceptives.   History of syncope felt secondary to dehydration from norovirus 2018 no recurrence   Mild MR on echo in 2018 minimal murmur on exam.  Will not repeat 2D echo at this time.  Follow-up with Dr. Delton See in 1 year.   Palpitations self-limiting and no increase with pregnancy.   Pregnancy-on lovenox since had a PE secondary to oral contraceptives. She had a normal LVEF in 2018. Reviewed with Dr. Delton See who recommends she a vaginal delivery. Also check echo sometime between the 2nd and 3rd trimester. Will schedule.    Medication Adjustments/Labs and Tests  Ordered: Current medicines are reviewed at length with the patient today.  Concerns regarding medicines are outlined above.  Medication changes, Labs and Tests ordered today are listed in the Patient Instructions below. Patient Instructions  Medication Instructions:  Your physician recommends that you continue on your current medications as directed. Please refer to the Current Medication list given to you today.  *If you need a refill on your cardiac medications before your next appointment, please call your pharmacy*   Lab Work: None ordered  If you have labs (blood work) drawn today and your tests are completely normal, you will receive your results only by: Marland Kitchen MyChart Message (if you have MyChart) OR . A paper copy in the mail If you have any lab test that is abnormal or we need to change your treatment, we will call you to review the results.   Testing/Procedures: None ordered   Follow-Up: At [facility], you and your health needs are our priority.  As part of our continuing mission to provide you with exceptional heart care, we have created designated Provider Care Teams.  These Care Teams include your primary Cardiologist (physician) and Advanced Practice Providers (APPs -  Physician Assistants and Nurse Practitioners) who all work together to provide you with the care you need, when you need it.  We recommend signing up for the patient portal called "MyChart".  Sign up information is provided on this After Visit Summary.  MyChart is used to connect with patients for Virtual Visits (Telemedicine).  Patients are able to view lab/test results, encounter notes, upcoming appointments, etc.  Non-urgent messages can be sent to your provider as well.   To learn more about what you can do with MyChart, go to [email or web address].    Your next appointment:   12 month(s)  The format for your next appointment:   In  Person  Provider:   You may see Tobias Alexander, MD or one of the  following Advanced Practice Providers on your designated Care Team:    Ronie Spies, PA-C  Jacolyn Reedy, PA-C    Other Instructions      Signed, Jacolyn Reedy, PA-C  07/11/2019 8:45 AM    [facility] [address] [place], [place], [place]  [address] Phone: [phone]; Fax: [phone]

## 2019-07-10 NOTE — Progress Notes (Signed)
[name] [address] [place] [address] Phone: [phone] Subjective:   I Sheri [name] am serving as a Neurosurgeon for Dr. Antoine Primas.  This visit occurred during the SARS-CoV-2 public health emergency.  Safety protocols were in place, including screening questions prior to the visit, additional usage of staff PPE, and extensive cleaning of exam room while observing appropriate contact time as indicated for disinfecting solutions.   I'm seeing this patient by the request  of:  Panosh, Neta Mends, MD  CC: Upper and mid back pain.  QVZ:[id number]  Sheri [name] is a 29 y.o. female coming in with complaint of back pain. Last seen on 03/02/2019 for OMT. Patient states she is doing well today.  Patient is now [redacted] weeks pregnant.  States that things have been changing.  Describes the pain as a dull, throbbing aching sensation.  A little more severe in certain areas and may be a little better than others.  Not able to work out as regularly secondary to endurance.  Walking more frequently    Past Medical History:  Diagnosis Date  . Alcohol addiction (HCC)   . Anxiety   . Bipolar depression (HCC)    under psych rx   . Depression   . Drug addiction in remission [facility])    heroin  . Enteritis due to Norovirus 05/18/2016  . Hx of pulmonary embolus 02/13/2015  . Hyperlipidemia   . Hypokalemia 05/17/2016  . IUD (intrauterine device) in place 02/13/2015  . Mild mitral regurgitation   . Mild tricuspid regurgitation   . Mitral regurgitation 06/13/2018  . Near syncope 05/16/2016   a. felt due to norovirus.  . Nonallopathic lesion of cervical region 09/01/2018  . Nonallopathic lesion of rib cage 09/01/2018  . Obesity   . Orthostatic hypotension 05/18/2016  . PCOS (polycystic ovarian syndrome) 02/13/2015  . Pulmonary embolism (HCC) 2011   neg heme evaluation  felt to be from ocps   . Scapular dyskinesis 09/01/2018  . Smoker 12/30/2016  . Syncope and  collapse 05/16/2016  . Thoracic outlet syndrome of left thoracic outlet 02/15/2018  . Thoracic radiculopathy due to degenerative joint disease of spine 10/13/2016   CT and XR show at least 3 levels of DDD of thoracic spine  I suspect this is related to both the scoliosis and the history of dance   Past Surgical History:  Procedure Laterality Date  . NO PAST SURGERIES     Social History   Socioeconomic History  . Marital status: Married    Spouse name: Not on file  . Number of children: Not on file  . Years of education: Not on file  . Highest education level: Not on file  Occupational History  . Occupation: nanny  Tobacco Use  . Smoking status: Former Smoker    Quit date: 12/20/2015    Years since quitting: 3.5  . Smokeless tobacco: Never Used  Substance and Sexual Activity  . Alcohol use: No    Alcohol/week: 0.0 standard drinks  . Drug use: No  . Sexual activity: Not on file  Other Topics Concern  . 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 Difficulty of Paying Living Expenses:   Food Insecurity:   . Worried About Charity fundraiser in the Last Year:   . Arboriculturist in the Last Year:   Transportation Needs:   . Film/video editor (Medical):   Marland Kitchen Lack of Transportation (Non-Medical):   Physical Activity:   . Days of Exercise per Week:   . Minutes of Exercise per Session:   Stress:   . Feeling of Stress :   Social Connections:   . Frequency of Communication with Friends and Family:   . Frequency of Social Gatherings with Friends and Family:   . Attends Religious Services:   . Active Member of Clubs or Organizations:   . Attends Archivist  Meetings:   Marland Kitchen Marital Status:    Allergies  Allergen Reactions  . Bupropion Other (See Comments)   Family History  Problem Relation Age of Onset  . Colon cancer Maternal Grandfather   . Hypertension Father   . Irritable bowel syndrome Father     Current Outpatient Medications (Endocrine & Metabolic):  .  metFORMIN (GLUCOPHAGE-XR) 500 MG 24 hr tablet, Take 500 mg by mouth in the morning and at bedtime.     Current Outpatient Medications (Hematological):  .  enoxaparin (LOVENOX) 40 MG/0.4ML injection, Inject 40 mg into the skin daily.   Current Outpatient Medications (Other):  .  amoxicillin (AMOXIL) 500 MG capsule, Take 500 mg by mouth 3 (three) times daily. .  clotrimazole (LOTRIMIN AF) 1 % cream, Lotrimin AF (clotrimazole) 1 % topical cream  APPLY TO THE AFFECTED AND SURROUNDING AREAS OF SKIN BY TOPICAL ROUTE 2 TIMES PER DAY IN THE MORNING AND EVENING .  hydrOXYzine (ATARAX/VISTARIL) 25 MG tablet, 25-50 mg Po As needed with flying q 8 hours .  nystatin-triamcinolone ointment (MYCOLOG), Apply 1 application topically 2 (two) times daily. .  sertraline (ZOLOFT) 50 MG tablet, Take 75 mg by mouth at bedtime.  Marland Kitchen  terconazole (TERAZOL 7) 0.4 % vaginal cream, Place 1 applicator vaginally at bedtime.   Reviewed prior external information including notes and imaging from  primary care provider As well as notes that were available from care everywhere and other healthcare systems.  Past medical history, social, surgical and family history all reviewed in electronic medical record.  No pertanent information unless stated regarding to the chief complaint.   Review of Systems:  No headache, visual changes, nausea, vomiting, diarrhea, constipation, dizziness, abdominal pain, skin rash, fevers, chills, night sweats, weight loss, swollen lymph nodes, body aches, joint swelling, chest pain, shortness of breath, mood changes. POSITIVE muscle aches  Objective  Blood pressure 104/64, pulse 82,  height 5\' 3"  (1.6 m), weight 189 lb (85.7 kg), SpO2 98 %.   General: No apparent distress alert and oriented x3 mood and affect normal, dressed appropriately.  HEENT: Pupils equal, extraocular movements intact abdominal is gravid Respiratory: Patient's speak in full sentences and does not appear short of breath  Cardiovascular: No lower extremity edema, non tender, no erythema  Neuro: Cranial nerves II through XII are intact, neurovascularly intact in all extremities with 2+ DTRs and 2+ pulses.  Gait normal with good balance and coordination.  MSK:  Non tender with full range of motion and good stability and symmetric strength and tone of shoulders, elbows, wrist, hip, knee and ankles bilaterally.  Mild scapular dyskinesis noted bilaterally.  Tightness noted in  the parascapular region.  Tightness of the neck noted with flexion lacking the last 2 degrees of flexion in the last 5 degrees of extension.  Negative Spurling's.  Osteopathic findings C4 flexed rotated and side bent left C6 flexed rotated and side bent left T3 extended rotated and side bent right inhaled third rib T6 extended rotated and side bent left L2 flexed rotated and side bent right Sacrum right on right    Impression and Recommendations:     This case required medical decision making of moderate complexity. The above documentation has been reviewed and is accurate and complete Judi Saa, DO       Note: This dictation was prepared with Dragon dictation along with smaller phrase technology. Any transcriptional errors that result from this process are unintentional.

## 2019-07-11 ENCOUNTER — Other Ambulatory Visit: Payer: Self-pay

## 2019-07-11 ENCOUNTER — Ambulatory Visit: Payer: BC Managed Care – PPO | Admitting: Family Medicine

## 2019-07-11 ENCOUNTER — Encounter: Payer: Self-pay | Admitting: Physician Assistant

## 2019-07-11 ENCOUNTER — Ambulatory Visit: Payer: BC Managed Care – PPO | Admitting: Physician Assistant

## 2019-07-11 ENCOUNTER — Encounter: Payer: Self-pay | Admitting: Family Medicine

## 2019-07-11 VITALS — BP 116/70 | HR 100 | Ht 63.0 in | Wt 189.2 lb

## 2019-07-11 VITALS — BP 104/64 | HR 82 | Ht 63.0 in | Wt 189.0 lb

## 2019-07-11 DIAGNOSIS — I340 Nonrheumatic mitral (valve) insufficiency: Secondary | ICD-10-CM

## 2019-07-11 DIAGNOSIS — Z87898 Personal history of other specified conditions: Secondary | ICD-10-CM

## 2019-07-11 DIAGNOSIS — R002 Palpitations: Secondary | ICD-10-CM | POA: Diagnosis not present

## 2019-07-11 DIAGNOSIS — M999 Biomechanical lesion, unspecified: Secondary | ICD-10-CM | POA: Diagnosis not present

## 2019-07-11 DIAGNOSIS — Z86711 Personal history of pulmonary embolism: Secondary | ICD-10-CM

## 2019-07-11 DIAGNOSIS — G2589 Other specified extrapyramidal and movement disorders: Secondary | ICD-10-CM

## 2019-07-11 NOTE — Progress Notes (Signed)
Patient scheduled for 5/25

## 2019-07-11 NOTE — Patient Instructions (Signed)
Good to see you Avoid repetitive extension Increase vitamin D to 2,000 IUs daily See me again in 4 weeks

## 2019-07-11 NOTE — Assessment & Plan Note (Signed)

## 2019-07-11 NOTE — Patient Instructions (Signed)
Medication Instructions:  Your physician recommends that you continue on your current medications as directed. Please refer to the Current Medication list given to you today.  *If you need a refill on your cardiac medications before your next appointment, please call your pharmacy*   Lab Work: None ordered  If you have labs (blood work) drawn today and your tests are completely normal, you will receive your results only by: . MyChart Message (if you have MyChart) OR . A paper copy in the mail If you have any lab test that is abnormal or we need to change your treatment, we will call you to review the results.   Testing/Procedures: None ordered   Follow-Up: At [facility], you and your health needs are our priority.  As part of our continuing mission to provide you with exceptional heart care, we have created designated Provider Care Teams.  These Care Teams include your primary Cardiologist (physician) and Advanced Practice Providers (APPs -  Physician Assistants and Nurse Practitioners) who all work together to provide you with the care you need, when you need it.  We recommend signing up for the patient portal called "MyChart".  Sign up information is provided on this After Visit Summary.  MyChart is used to connect with patients for Virtual Visits (Telemedicine).  Patients are able to view lab/test results, encounter notes, upcoming appointments, etc.  Non-urgent messages can be sent to your provider as well.   To learn more about what you can do with MyChart, go to [email or web address].    Your next appointment:   12 month(s)  The format for your next appointment:   In Person  Provider:   You may see Katarina Nelson, MD or one of the following Advanced Practice Providers on your designated Care Team:    Dayna Dunn, PA-C  Michele Lenze, PA-C    Other Instructions  

## 2019-07-11 NOTE — Assessment & Plan Note (Signed)
Chronic problem : Mild exacerbation with now history pregnancy  interventions previously, including medication management: Patient is doing over-the-counter medications mostly only vitamins "   Interventions this visit: OMT and encourage patient to avoid repetitive extension  We discussed with patient the importance ergonomics, home exercises, icing regimen, and over-the-counter natural products.   Future considerations but will be based on evaluation and next visit: omt again     Return to clinic: 4-8 weeks

## 2019-07-23 ENCOUNTER — Encounter (HOSPITAL_COMMUNITY): Payer: Self-pay | Admitting: Obstetrics

## 2019-07-23 ENCOUNTER — Other Ambulatory Visit: Payer: Self-pay

## 2019-07-23 ENCOUNTER — Inpatient Hospital Stay (HOSPITAL_COMMUNITY)
Admission: AD | Admit: 2019-07-23 | Discharge: 2019-07-23 | Disposition: A | Discharge status: Home / Self Care | Payer: BC Managed Care – PPO | Attending: Obstetrics | Admitting: Obstetrics

## 2019-07-23 DIAGNOSIS — O9A212 Injury, poisoning and certain other consequences of external causes complicating pregnancy, second trimester: Secondary | ICD-10-CM | POA: Diagnosis not present

## 2019-07-23 DIAGNOSIS — Z7901 Long term (current) use of anticoagulants: Secondary | ICD-10-CM | POA: Insufficient documentation

## 2019-07-23 DIAGNOSIS — Z87891 Personal history of nicotine dependence: Secondary | ICD-10-CM | POA: Insufficient documentation

## 2019-07-23 DIAGNOSIS — O26892 Other specified pregnancy related conditions, second trimester: Secondary | ICD-10-CM

## 2019-07-23 DIAGNOSIS — W500XXA Accidental hit or strike by another person, initial encounter: Secondary | ICD-10-CM | POA: Diagnosis not present

## 2019-07-23 DIAGNOSIS — R109 Unspecified abdominal pain: Secondary | ICD-10-CM

## 2019-07-23 DIAGNOSIS — Z3A19 19 weeks gestation of pregnancy: Secondary | ICD-10-CM | POA: Insufficient documentation

## 2019-07-23 DIAGNOSIS — Z86711 Personal history of pulmonary embolism: Secondary | ICD-10-CM | POA: Diagnosis not present

## 2019-07-23 LAB — URINALYSIS, ROUTINE W REFLEX MICROSCOPIC
Bilirubin Urine: NEGATIVE
Glucose, UA: NEGATIVE mg/dL
Hgb urine dipstick: NEGATIVE
Ketones, ur: NEGATIVE mg/dL
Leukocytes,Ua: NEGATIVE
Nitrite: NEGATIVE
Protein, ur: NEGATIVE mg/dL
Specific Gravity, Urine: 1.008 (ref 1.005–1.030)
pH: 7 (ref 5.0–8.0)
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
## 2019-07-23 NOTE — Discharge Instructions (Signed)
Preventing Injuries During Pregnancy  Injuries can happen during pregnancy. Minor falls and accidents usually do not harm you or your baby. But some injuries can harm you and your baby. Tell your doctor about any injury you suffer. What can I do to avoid injuries? Safety  Remove rugs and loose objects on the floor.  Wear comfortable shoes that have a good grip. Do not wear shoes that have high heels.  Always wear your seat belt in the car. The lap belt should be below your belly. Always drive safely.  Do not ride on a motorcycle. Activity  Do not take part in rough and violent activities or sports.  Avoid: ? Walking on wet or slippery floors. ? Lifting heavy pots of boiling or hot liquids. ? Fixing electrical problems. ? Being near fires. General instructions  Take over-the-counter and prescription medicines only as told by your doctor.  Know your blood type and the blood type of the baby's father.  If you are a victim of domestic violence: ? Call your local emergency services (911 in the [place] ? Contact the National Domestic Violence Hotline for help and support. Get help right away if:  You fall on your belly or receive any serious blow to your belly.  You have a stiff neck or neck pain after a fall or an injury.  You get a headache or have problems with vision after an injury.  You do not feel the baby move or the baby is not moving as much as normal.  You have been a victim of domestic violence or any other kind of attack.  You have been in a car accident.  You have bleeding from your vagina.  Fluid is leaking from your vagina.  You start to have cramping or pain in your belly (contractions).  You have very bad pain in your lower back.  You feel weak or pass out (faint).  You start to throw up (vomit) after an injury.  You have been burned. Summary  Some injuries that happen during pregnancy can do harm to the baby.  Tell your doctor about any  injury.  Take steps to avoid injury. This includes removing rugs and loose objects on the floor. Always wear your seat belt in the car.  Do not take part in rough and violent activities or sports.  Get help right away if you have any serious accident or injury. This information is not intended to replace advice given to you by your health care provider. Make sure you discuss any questions you have with your health care provider. Document Revised: 12/30/2018 Document Reviewed: 04/15/2016 Elsevier Patient Education  2020 Elsevier Inc.  
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
## 2019-08-08 ENCOUNTER — Other Ambulatory Visit: Payer: Self-pay

## 2019-08-08 ENCOUNTER — Encounter: Payer: Self-pay | Admitting: Family Medicine

## 2019-08-08 ENCOUNTER — Ambulatory Visit: Payer: BC Managed Care – PPO | Admitting: Family Medicine

## 2019-08-08 VITALS — BP 110/70 | HR 93 | Ht 63.0 in | Wt 195.0 lb

## 2019-08-08 DIAGNOSIS — M999 Biomechanical lesion, unspecified: Secondary | ICD-10-CM | POA: Diagnosis not present

## 2019-08-08 DIAGNOSIS — G2589 Other specified extrapyramidal and movement disorders: Secondary | ICD-10-CM

## 2019-08-08 NOTE — Patient Instructions (Signed)
Hip flexor stretches See me again in 3-4 weeks

## 2019-08-08 NOTE — Progress Notes (Signed)
Sheri [name] [address] [place] [address] Phone: [phone] Subjective:   Sheri [name], am serving as a scribe for Dr. Antoine Primas. This visit occurred during the SARS-CoV-2 public health emergency.  Safety protocols were in place, including screening questions prior to the visit, additional usage of staff PPE, and extensive cleaning of exam room while observing appropriate contact time as indicated for disinfecting solutions.   I'm seeing this patient by the request  of:  Panosh, Neta Mends, MD  CC: Low back pain follow-up  HLK:[id number]  Sheri [name] is a 29 y.o. female coming in with complaint of back pain. Last seen on 07/11/2019 for OMT. Patient states she has not had any issues since last visit.  Patient is gravid.  More discomfort with repetitive activity.      Past Medical History:  Diagnosis Date  . Alcohol addiction (HCC)   . Anxiety   . Bipolar depression (HCC)    under psych rx   . Depression   . Drug addiction in remission [facility])    heroin  . Enteritis due to Norovirus 05/18/2016  . Hx of pulmonary embolus 02/13/2015  . Hyperlipidemia   . Hypokalemia 05/17/2016  . IUD (intrauterine device) in place 02/13/2015  . Mild mitral regurgitation   . Mild tricuspid regurgitation   . Mitral regurgitation 06/13/2018  . Near syncope 05/16/2016   a. felt due to norovirus.  . Nonallopathic lesion of cervical region 09/01/2018  . Nonallopathic lesion of rib cage 09/01/2018  . Obesity   . Orthostatic hypotension 05/18/2016  . PCOS (polycystic ovarian syndrome) 02/13/2015  . Pulmonary embolism (HCC) 2011   neg heme evaluation  felt to be from ocps   . Scapular dyskinesis 09/01/2018  . Smoker 12/30/2016  . Syncope and collapse 05/16/2016  . Thoracic outlet syndrome of left thoracic outlet 02/15/2018  . Thoracic radiculopathy due to degenerative joint disease of spine 10/13/2016   CT and XR show at least 3 levels of DDD of thoracic  spine  I suspect this is related to both the scoliosis and the history of dance   Past Surgical History:  Procedure Laterality Date  . NO PAST SURGERIES     Social History   Socioeconomic History  . Marital status: Married    Spouse name: Not on file  . Number of children: Not on file  . Years of education: Not on file  . Highest education level: Not on file  Occupational History  . Occupation: nanny  Tobacco Use  . Smoking status: Former Smoker    Quit date: 12/20/2015    Years since quitting: 3.6  . Smokeless tobacco: Never Used  Substance and Sexual Activity  . Alcohol use: No    Alcohol/week: 0.0 standard drinks  . Drug use: No  . Sexual activity: Not on file  Other Topics Concern  . 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 Difficulty of Paying Living Expenses:   Food Insecurity:   . Worried About Programme researcher, broadcasting/film/video in the Last Year:   .  Ran Out of Food in the Last Year:   Transportation Needs:   . Film/video editor (Medical):   Marland Kitchen Lack of Transportation (Non-Medical):   Physical Activity:   . Days of Exercise per Week:   . Minutes of Exercise per Session:   Stress:   . Feeling of Stress :   Social Connections:   . Frequency of Communication with Friends and Family:   . Frequency of Social Gatherings with Friends and Family:   . Attends Religious Services:   . Active Member of Clubs or Organizations:   . Attends Archivist Meetings:   Marland Kitchen Marital Status:    Allergies  Allergen Reactions  . Bupropion Other (See Comments)   Family History  Problem Relation Age of Onset  . Colon cancer Maternal Grandfather   . Hypertension Father   .  Irritable bowel syndrome Father     Current Outpatient Medications (Endocrine & Metabolic):  .  metFORMIN (GLUCOPHAGE-XR) 500 MG 24 hr tablet, Take 500 mg by mouth in the morning and at bedtime.     Current Outpatient Medications (Hematological):  .  enoxaparin (LOVENOX) 40 MG/0.4ML injection, Inject 40 mg into the skin daily.   Current Outpatient Medications (Other):  .  amoxicillin (AMOXIL) 500 MG capsule, Take 500 mg by mouth 3 (three) times daily. .  clotrimazole (LOTRIMIN AF) 1 % cream, Lotrimin AF (clotrimazole) 1 % topical cream  APPLY TO THE AFFECTED AND SURROUNDING AREAS OF SKIN BY TOPICAL ROUTE 2 TIMES PER DAY IN THE MORNING AND EVENING .  hydrOXYzine (ATARAX/VISTARIL) 25 MG tablet, 25-50 mg Po As needed with flying q 8 hours .  nystatin-triamcinolone ointment (MYCOLOG), Apply 1 application topically 2 (two) times daily. .  sertraline (ZOLOFT) 50 MG tablet, Take 75 mg by mouth at bedtime.  Marland Kitchen  terconazole (TERAZOL 7) 0.4 % vaginal cream, Place 1 applicator vaginally at bedtime.   Reviewed prior external information including notes and imaging from  primary care provider As well as notes that were available from care everywhere and other healthcare systems.  Past medical history, social, surgical and family history all reviewed in electronic medical record.  No pertanent information unless stated regarding to the chief complaint.   Review of Systems:  No headache, visual changes, nausea, vomiting, diarrhea, constipation, dizziness, abdominal pain, skin rash, fevers, chills, night sweats, weight loss, swollen lymph nodes,  joint swelling, chest pain, shortness of breath, mood changes. POSITIVE muscle aches, body aches  Objective  Blood pressure 110/70, pulse 93, height 5\' 3"  (1.6 m), weight 195 lb (88.5 kg).   General: No apparent distress alert and oriented x3 mood and affect normal, dressed appropriately.  Patient is gravid HEENT: Pupils equal, extraocular movements intact    Respiratory: Patient's speak in full sentences and does not appear short of breath  Cardiovascular: No lower extremity edema, non tender, no erythema  Neuro: Cranial nerves II through XII are intact, neurovascularly intact in all extremities with 2+ DTRs and 2+ pulses.  Gait normal with good balance and coordination.  MSK:  Non tender with full range of motion and good stability and symmetric strength and tone of shoulders, elbows, wrist, hip, knee and ankles bilaterally.  Low back exam tenderness increasing lordosis noted.  Tender to palpation of the paraspinal musculature lumbar spine right greater than left.  Patient does have more pain over the sacroiliac joint but also over the paraspinal musculature throughout the lumbar spine.  5-5 strength lower extremities still noted.  Scapular dyskinesis  noted higher.  Osteopathic findings C6 flexed rotated and side bent left T8 extended rotated and side bent right inhaled rib T11 extended rotated and side bent left L4 flexed rotated and side bent left Sacrum right on right    Impression and Recommendations:     This case required medical decision making of moderate complexity. The above documentation has been reviewed and is accurate and complete Sheri Saa, DO       Note: This dictation was prepared with Dragon dictation along with smaller phrase technology. Any transcriptional errors that result from this process are unintentional.

## 2019-08-08 NOTE — Assessment & Plan Note (Signed)
Chronic problem with exacerbation.  Patient was treated in the second trimester of this pregnancy.  Increasing in progesterone is causing some instability exacerbation of the underlying problem.  Discussed which activities to do which wants to avoid.  Increase activity slowly.  Follow-up again in 4 to 8 weeks.

## 2019-08-08 NOTE — Assessment & Plan Note (Signed)

## 2019-08-20 ENCOUNTER — Other Ambulatory Visit: Payer: Self-pay

## 2019-08-20 ENCOUNTER — Inpatient Hospital Stay (HOSPITAL_COMMUNITY)
Admission: AD | Admit: 2019-08-20 | Discharge: 2019-08-20 | Disposition: A | Discharge status: Home / Self Care | Payer: BC Managed Care – PPO | Attending: Obstetrics | Admitting: Obstetrics

## 2019-08-20 ENCOUNTER — Encounter (HOSPITAL_COMMUNITY): Payer: Self-pay | Admitting: Obstetrics

## 2019-08-20 DIAGNOSIS — Z888 Allergy status to other drugs, medicaments and biological substances status: Secondary | ICD-10-CM | POA: Insufficient documentation

## 2019-08-20 DIAGNOSIS — Z87891 Personal history of nicotine dependence: Secondary | ICD-10-CM | POA: Diagnosis not present

## 2019-08-20 DIAGNOSIS — N898 Other specified noninflammatory disorders of vagina: Secondary | ICD-10-CM

## 2019-08-20 DIAGNOSIS — Z3A23 23 weeks gestation of pregnancy: Secondary | ICD-10-CM

## 2019-08-20 DIAGNOSIS — Z3689 Encounter for other specified antenatal screening: Secondary | ICD-10-CM | POA: Diagnosis not present

## 2019-08-20 DIAGNOSIS — Z7901 Long term (current) use of anticoagulants: Secondary | ICD-10-CM | POA: Insufficient documentation

## 2019-08-20 DIAGNOSIS — O99891 Other specified diseases and conditions complicating pregnancy: Secondary | ICD-10-CM

## 2019-08-20 DIAGNOSIS — Z8379 Family history of other diseases of the digestive system: Secondary | ICD-10-CM | POA: Diagnosis not present

## 2019-08-20 DIAGNOSIS — O26892 Other specified pregnancy related conditions, second trimester: Secondary | ICD-10-CM

## 2019-08-20 DIAGNOSIS — O368120 Decreased fetal movements, second trimester, not applicable or unspecified: Secondary | ICD-10-CM | POA: Diagnosis not present

## 2019-08-20 DIAGNOSIS — Z86711 Personal history of pulmonary embolism: Secondary | ICD-10-CM | POA: Insufficient documentation

## 2019-08-20 DIAGNOSIS — Z8249 Family history of ischemic heart disease and other diseases of the circulatory system: Secondary | ICD-10-CM | POA: Diagnosis not present

## 2019-08-20 LAB — URINALYSIS, ROUTINE W REFLEX MICROSCOPIC
Bilirubin Urine: NEGATIVE
Glucose, UA: NEGATIVE mg/dL
Hgb urine dipstick: NEGATIVE
Ketones, ur: NEGATIVE mg/dL
Leukocytes,Ua: NEGATIVE
Nitrite: NEGATIVE
Protein, ur: NEGATIVE mg/dL
Specific Gravity, Urine: 1.019 (ref 1.005–1.030)
pH: 6 (ref 5.0–8.0)

## 2019-08-20 LAB — WET PREP, GENITAL
Clue Cells Wet Prep HPF POC: NONE SEEN
Sperm: NONE SEEN
Trich, Wet Prep: NONE SEEN
Yeast Wet Prep HPF POC: NONE SEEN

## 2019-08-20 NOTE — Discharge Instructions (Signed)
Fetal Movement Counts Patient Name: ________________________________________________ Patient Due Date: ____________________ What is a fetal movement count?  A fetal movement count is the number of times that you feel your baby move during a certain amount of time. This may also be called a fetal kick count. A fetal movement count is recommended for every pregnant woman. You may be asked to start counting fetal movements as early as week 28 of your pregnancy. Pay attention to when your baby is most active. You may notice your baby's sleep and wake cycles. You may also notice things that make your baby move more. You should do a fetal movement count:  When your baby is normally most active.  At the same time each day. A good time to count movements is while you are resting, after having something to eat and drink. How do I count fetal movements? 1. Find a quiet, comfortable area. Sit, or lie down on your side. 2. Write down the date, the start time and stop time, and the number of movements that you felt between those two times. Take this information with you to your health care visits. 3. Write down your start time when you feel the first movement. 4. Count kicks, flutters, swishes, rolls, and jabs. You should feel at least 10 movements. 5. You may stop counting after you have felt 10 movements, or if you have been counting for 2 hours. Write down the stop time. 6. If you do not feel 10 movements in 2 hours, contact your health care provider for further instructions. Your health care provider may want to do additional tests to assess your baby's well-being. Contact a health care provider if:  You feel fewer than 10 movements in 2 hours.  Your baby is not moving like he or she usually does. Date: ____________ Start time: ____________ Stop time: ____________ Movements: ____________ Date: ____________ Start time: ____________ Stop time: ____________ Movements: ____________ Date: ____________  Start time: ____________ Stop time: ____________ Movements: ____________ Date: ____________ Start time: ____________ Stop time: ____________ Movements: ____________ Date: ____________ Start time: ____________ Stop time: ____________ Movements: ____________ Date: ____________ Start time: ____________ Stop time: ____________ Movements: ____________ Date: ____________ Start time: ____________ Stop time: ____________ Movements: ____________ Date: ____________ Start time: ____________ Stop time: ____________ Movements: ____________ Date: ____________ Start time: ____________ Stop time: ____________ Movements: ____________ This information is not intended to replace advice given to you by your health care provider. Make sure you discuss any questions you have with your health care provider. Document Revised: 11/24/2018 Document Reviewed: 11/24/2018 Elsevier Patient Education  2020 Elsevier Inc.  

## 2019-08-20 NOTE — MAU Provider Note (Signed)
History     CSN: [id number]  Arrival date and time: 08/20/19 1116   First Provider Initiated Contact with Patient 08/20/19 1157      Chief Complaint  Patient presents with  . Decreased Fetal Movement  . Vaginal Discharge   HPI   Sheri [name] is a 29 y.o. female G1P0000 @ [redacted]w[redacted]d here in MAU with complaints of decreased fetal movement since Thursday. Says she has not felt the baby move. States prior to Thursday she was feeling movement daily; inconsistently..  As of now since her arrival to MAU she has felt her baby move several times. The patient was told she has an anterior placenta. No bleeding or leaking of fluid. Does attest to a foul smelling discharge. No fishy odor, just out of the ordinary from her normal scent. Some mild irritation. No itching. No leaking of fluid, just an increase in white discharge. States she has had yeast in the past snd wonders if it may be that. Denies pain.   Hx of PE in 2019 from OCP's; negative hematology workup. Currently on Lovenox.   OB History    Gravida  1   Para  0   Term  0   Preterm  0   AB  0   Living  0     SAB  0   TAB  0   Ectopic  0   Multiple  0   Live Births              Past Medical History:  Diagnosis Date  . Alcohol addiction ([place])   . Anxiety   . Bipolar depression ([place])    under psych rx   . Depression   . Drug addiction in remission [facility])    heroin  . Enteritis due to Norovirus 05/18/2016  . Hx of pulmonary embolus 02/13/2015  . Hyperlipidemia   . Hypokalemia 05/17/2016  . IUD (intrauterine device) in place 02/13/2015  . Mild mitral regurgitation   . Mild tricuspid regurgitation   . Mitral regurgitation 06/13/2018  . Near syncope 05/16/2016   a. felt due to norovirus.  . Nonallopathic lesion of cervical region 09/01/2018  . Nonallopathic lesion of rib cage 09/01/2018  . Obesity   . Orthostatic hypotension 05/18/2016  . PCOS (polycystic ovarian syndrome) 02/13/2015  . Pulmonary embolism  ([place]) 2011   neg heme evaluation  felt to be from ocps   . Scapular dyskinesis 09/01/2018  . Smoker 12/30/2016  . Syncope and collapse 05/16/2016  . Thoracic outlet syndrome of left thoracic outlet 02/15/2018  . Thoracic radiculopathy due to degenerative joint disease of spine 10/13/2016   CT and XR show at least 3 levels of DDD of thoracic spine  I suspect this is related to both the scoliosis and the history of dance    Past Surgical History:  Procedure Laterality Date  . NO PAST SURGERIES      Family History  Problem Relation Age of Onset  . Colon cancer Maternal Grandfather   . Hypertension Father   . Irritable bowel syndrome Father     Social History   Tobacco Use  . Smoking status: Former Smoker    Quit date: 12/20/2015    Years since quitting: 3.6  . Smokeless tobacco: Never Used  Substance Use Topics  . Alcohol use: No    Alcohol/week: 0.0 standard drinks  . Drug use: No    Allergies:  Allergies  Allergen Reactions  . Bupropion Other (See Comments)  No medications prior to admission.   Results for orders placed or performed during the hospital encounter of 08/20/19 (from the past 48 hour(s))  Wet prep, genital     Status: Abnormal   Collection Time: 08/20/19 12:06 PM   Specimen: Vaginal  Result Value Ref Range   Yeast Wet Prep HPF POC NONE SEEN NONE SEEN   Trich, Wet Prep NONE SEEN NONE SEEN   Clue Cells Wet Prep HPF POC NONE SEEN NONE SEEN   WBC, Wet Prep HPF POC MANY (A) NONE SEEN   Sperm NONE SEEN     Comment: Performed at [facility], [address], [place], [place] [address]  Urinalysis, Routine w reflex microscopic     Status: None   Collection Time: 08/20/19 12:15 PM  Result Value Ref Range   Color, Urine YELLOW YELLOW   APPearance CLEAR CLEAR   Specific Gravity, Urine 1.019 1.005 - 1.030   pH 6.0 5.0 - 8.0   Glucose, UA NEGATIVE NEGATIVE mg/dL   Hgb urine dipstick NEGATIVE NEGATIVE   Bilirubin Urine NEGATIVE NEGATIVE   Ketones, ur  NEGATIVE NEGATIVE mg/dL   Protein, ur NEGATIVE NEGATIVE mg/dL   Nitrite NEGATIVE NEGATIVE   Leukocytes,Ua NEGATIVE NEGATIVE    Comment: Performed at [facility], [address], [place], [place] [address]   Review of Systems  Gastrointestinal: Negative for abdominal pain.  Genitourinary: Positive for vaginal discharge. Negative for vaginal bleeding.   Physical Exam   Blood pressure 136/68, pulse 89, temperature 98.5 F (36.9 C), temperature source Oral, resp. rate 17, height 5\' 3"  (1.6 m), weight 89.5 kg, SpO2 99 %.  Physical Exam  Constitutional: She is oriented to person, place, and time. She appears well-developed and well-nourished. No distress.  HENT:  Head: Normocephalic.  Eyes: Pupils are equal, round, and reactive to light.  Genitourinary:    Genitourinary Comments: Wet prep and GC collected by NP.     Musculoskeletal:        General: Normal range of motion.  Neurological: She is alert and oriented to person, place, and time.  Skin: Skin is warm. She is not diaphoretic.  Psychiatric: Her behavior is normal.   Fetal Tracing: Baseline: 145 bpm Variability: Moderate  Accelerations: 10x10 Decelerations: 3,  30 second variables  Toco: Quiet.   MAU Course  Procedures  None  MDM  Patient description of discharge not consistent with concern for ROM; after further discussion of discharge, discharge likely early yeast however wet prep negative. If worsens patient can pick up over the counter 7 day cream; she is agreeable.  Wet prep and GC collected and negative for BV and yeast F/u with office on Tuesday as scheduled.  Discussed patient with Dr. Tuesday: discussed fetal tracing, + fetal movement in MAU Discussed with patient 23 weeks and normalcy of inconsistent fetal movements at this gestation. No indication for [place]ah Loop or BPP: No pain, no bleeding. Reactive/approrpiate NST for 23 weeks.   Assessment and Plan   A:  1. Decreased fetal movements in second trimester,  single or unspecified fetus   2. [redacted] weeks gestation of pregnancy   3. NST (non-stress test) reactive   4. Vaginal discharge during pregnancy in second trimester     P:  Discharge home in stable condition Strict return precautions Keep your appointment in the office on Tuesday Fetal kicks may continue to be inconsistent until 3rd trimester   Luciano Cinquemani, Tuesday, NP 08/20/2019 7:12 PM

## 2019-08-20 NOTE — MAU Note (Signed)
Sheri [name] is a 29 y.o. at [redacted]w[redacted]d here in MAU reporting: states no FM since Thursday. Had some back pain last night but it has gone away. No bleeding. Has been having clear and thin discharge, unsure if SROM or abnormal discharge. Also having some mucus.   Onset of complaint: Thursday  Pain score: 0/10  Vitals:   08/20/19 1127  BP: 124/67  Pulse: 98  Resp: 16  Temp: 98.5 F (36.9 C)  SpO2: 99%     FHT: 160  Lab orders placed from triage: UA

## 2019-08-21 LAB — GC/CHLAMYDIA PROBE AMP (~~LOC~~) NOT AT ARMC
Chlamydia: NEGATIVE
Comment: NEGATIVE
Comment: NORMAL
Neisseria Gonorrhea: NEGATIVE

## 2019-09-07 ENCOUNTER — Ambulatory Visit: Payer: BC Managed Care – PPO | Admitting: Family Medicine

## 2019-09-07 ENCOUNTER — Other Ambulatory Visit: Payer: Self-pay

## 2019-09-07 ENCOUNTER — Encounter: Payer: Self-pay | Admitting: Family Medicine

## 2019-09-07 VITALS — BP 106/60 | HR 94 | Ht 63.0 in | Wt 208.0 lb

## 2019-09-07 DIAGNOSIS — G2589 Other specified extrapyramidal and movement disorders: Secondary | ICD-10-CM | POA: Diagnosis not present

## 2019-09-07 DIAGNOSIS — M999 Biomechanical lesion, unspecified: Secondary | ICD-10-CM | POA: Diagnosis not present

## 2019-09-07 NOTE — Assessment & Plan Note (Signed)
Chronic problem with exacerbation.  Encourage patient to continue the home exercises and icing regimen, discussed topical anti-inflammatories, discussed the potential change from Zoloft to Effexor or Cymbalta but patient does not want to make a change at the moment.  We discussed continuing the exercises and follow-up with me again in 4 to 8 weeks

## 2019-09-07 NOTE — Progress Notes (Signed)
[name] [address] [place] [address] Phone: [phone] Subjective:   Bruce Donath, am serving as a scribe for Dr. Antoine Primas. This visit occurred during the SARS-CoV-2 public health emergency.  Safety protocols were in place, including screening questions prior to the visit, additional usage of staff PPE, and extensive cleaning of exam room while observing appropriate contact time as indicated for disinfecting solutions.   I'm seeing this patient by the request  of:  Panosh, Neta Mends, MD  CC: back pain   [id number]  Sheri [name] is a 29 y.o. female coming in with complaint of back pain. Last seen on 08/08/2019 for OMT. Patient states that her pain is localized to lower back and hips.  Gravid patient is now [redacted] weeks pregnant.      Past Medical History:  Diagnosis Date  . Alcohol addiction (HCC)   . Anxiety   . Bipolar depression (HCC)    under psych rx   . Depression   . Drug addiction in remission [facility])    heroin  . Enteritis due to Norovirus 05/18/2016  . Hx of pulmonary embolus 02/13/2015  . Hyperlipidemia   . Hypokalemia 05/17/2016  . IUD (intrauterine device) in place 02/13/2015  . Mild mitral regurgitation   . Mild tricuspid regurgitation   . Mitral regurgitation 06/13/2018  . Near syncope 05/16/2016   a. felt due to norovirus.  . Nonallopathic lesion of cervical region 09/01/2018  . Nonallopathic lesion of rib cage 09/01/2018  . Obesity   . Orthostatic hypotension 05/18/2016  . PCOS (polycystic ovarian syndrome) 02/13/2015  . Pulmonary embolism (HCC) 2011   neg heme evaluation  felt to be from ocps   . Scapular dyskinesis 09/01/2018  . Smoker 12/30/2016  . Syncope and collapse 05/16/2016  . Thoracic outlet syndrome of left thoracic outlet 02/15/2018  . Thoracic radiculopathy due to degenerative joint disease of spine 10/13/2016   CT and XR show at least 3 levels of DDD of thoracic spine  I suspect this is  related to both the scoliosis and the history of dance   Past Surgical History:  Procedure Laterality Date  . NO PAST SURGERIES     Social History   Socioeconomic History  . Marital status: Married    Spouse name: Not on file  . Number of children: Not on file  . Years of education: Not on file  . Highest education level: Not on file  Occupational History  . Occupation: nanny  Tobacco Use  . Smoking status: Former Smoker    Quit date: 12/20/2015    Years since quitting: 3.7  . Smokeless tobacco: Never Used  Substance and Sexual Activity  . Alcohol use: No    Alcohol/week: 0.0 standard drinks  . Drug use: No  . Sexual activity: Not on file    Comment: previous IUD  Other Topics Concern  . 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 Difficulty of Paying Living Expenses:   Food Insecurity:   . Worried About Programme researcher, broadcasting/film/video in the  Last Year:   . [place] in the Last Year:   Transportation Needs:   . Film/video editor (Medical):   Marland Kitchen Lack of Transportation (Non-Medical):   Physical Activity:   . Days of Exercise per Week:   . Minutes of Exercise per Session:   Stress:   . Feeling of Stress :   Social Connections:   . Frequency of Communication with Friends and Family:   . Frequency of Social Gatherings with Friends and Family:   . Attends Religious Services:   . Active Member of Clubs or Organizations:   . Attends Archivist Meetings:   Marland Kitchen Marital Status:    Allergies  Allergen Reactions  . Bupropion Other (See Comments)   Family History  Problem Relation Age of Onset  . Colon cancer Maternal Grandfather   . Hypertension Father   .  Irritable bowel syndrome Father     Current Outpatient Medications (Endocrine & Metabolic):  .  metFORMIN (GLUCOPHAGE-XR) 500 MG 24 hr tablet, Take 500 mg by mouth in the morning and at bedtime.     Current Outpatient Medications (Hematological):  .  enoxaparin (LOVENOX) 40 MG/0.4ML injection, Inject 40 mg into the skin daily.   Current Outpatient Medications (Other):  .  clotrimazole (LOTRIMIN AF) 1 % cream, Lotrimin AF (clotrimazole) 1 % topical cream  APPLY TO THE AFFECTED AND SURROUNDING AREAS OF SKIN BY TOPICAL ROUTE 2 TIMES PER DAY IN THE MORNING AND EVENING .  hydrOXYzine (ATARAX/VISTARIL) 25 MG tablet, 25-50 mg Po As needed with flying q 8 hours .  nystatin-triamcinolone ointment (MYCOLOG), Apply 1 application topically 2 (two) times daily. .  sertraline (ZOLOFT) 50 MG tablet, Take 75 mg by mouth at bedtime.  Marland Kitchen  terconazole (TERAZOL 7) 0.4 % vaginal cream, Place 1 applicator vaginally at bedtime.   Reviewed prior external information including notes and imaging from  primary care provider As well as notes that were available from care everywhere and other healthcare systems.  Past medical history, social, surgical and family history all reviewed in electronic medical record.  No pertanent information unless stated regarding to the chief complaint.   Review of Systems:  No headache, visual changes, nausea, vomiting, diarrhea, constipation, dizziness, abdominal pain, skin rash, fevers, chills, night sweats, weight loss, swollen lymph nodes, body aches, joint swelling, chest pain, shortness of breath, mood changes. POSITIVE muscle aches  Objective  Blood pressure 106/60, pulse 94, height 5\' 3"  (1.6 m), weight 208 lb (94.3 kg), SpO2 98 %.   General: No apparent distress alert and oriented x3 mood and affect normal, dressed appropriately.  Patient is gravid HEENT: Pupils equal, extraocular movements intact  Respiratory: Patient's speak in full sentences and does not appear short  of breath  Cardiovascular: No lower extremity edema, non tender, no erythema  Neuro: Cranial nerves II through XII are intact, neurovascularly intact in all extremities with 2+ DTRs and 2+ pulses.  Gait n antalgic MSK: Mild increase in laxity of the joints noted from patient's baseline  Back exam mild increase in lordosis.  Patient does have tenderness to palpation more of the L5-S1 area right greater than left.  Some mild tightness at the thoracolumbar juncture more than usual as well.  Tightness in the parascapular region.  No spinous process tenderness.  Full range of motion of all the joints otherwise.  Osteopathic findings C2 flexed rotated and side bent right C7 flexed rotated and side bent left T5 extended rotated and side bent right  inhaled rib T7 extended rotated and side bent left L2 flexed rotated and side bent right Sacrum right on right     Impression and Recommendations:     This case required medical decision making of moderate complexity. The above documentation has been reviewed and is accurate and complete Sheri Saa, DO       Note: This dictation was prepared with Dragon dictation along with smaller phrase technology. Any transcriptional errors that result from this process are unintentional.

## 2019-09-07 NOTE — Assessment & Plan Note (Signed)

## 2019-09-07 NOTE — Patient Instructions (Signed)
Good to see you Keep up the exercises  See me again in 4 weeks

## 2019-09-12 ENCOUNTER — Other Ambulatory Visit (HOSPITAL_COMMUNITY): Payer: BC Managed Care – PPO

## 2019-09-19 ENCOUNTER — Ambulatory Visit (HOSPITAL_COMMUNITY): Payer: BC Managed Care – PPO | Attending: Cardiology

## 2019-09-19 ENCOUNTER — Other Ambulatory Visit: Payer: Self-pay

## 2019-09-19 DIAGNOSIS — R002 Palpitations: Secondary | ICD-10-CM

## 2019-09-19 DIAGNOSIS — I340 Nonrheumatic mitral (valve) insufficiency: Secondary | ICD-10-CM | POA: Diagnosis present

## 2019-09-19 DIAGNOSIS — Z86711 Personal history of pulmonary embolism: Secondary | ICD-10-CM

## 2019-09-19 DIAGNOSIS — Z87898 Personal history of other specified conditions: Secondary | ICD-10-CM | POA: Diagnosis not present

## 2019-10-05 ENCOUNTER — Ambulatory Visit: Payer: BC Managed Care – PPO | Admitting: Family Medicine

## 2019-10-05 ENCOUNTER — Other Ambulatory Visit: Payer: Self-pay

## 2019-10-05 ENCOUNTER — Encounter: Payer: Self-pay | Admitting: Family Medicine

## 2019-10-05 VITALS — BP 126/70 | HR 98 | Ht 63.0 in | Wt 208.0 lb

## 2019-10-05 DIAGNOSIS — M999 Biomechanical lesion, unspecified: Secondary | ICD-10-CM

## 2019-10-05 DIAGNOSIS — M546 Pain in thoracic spine: Secondary | ICD-10-CM | POA: Diagnosis not present

## 2019-10-05 NOTE — Assessment & Plan Note (Signed)
History of the pain in the thoracic spine.  Due to patient's pregnancy I think this is contributing.  Patient is having increasing laxity as well in the lumbar line.  No medication changes.  Follow-up again in 3 to 4 weeks

## 2019-10-05 NOTE — Progress Notes (Signed)
[name] [address] [place] [address] Phone: [phone] Subjective:   I Sheri [name] am serving as a Neurosurgeon for Dr. Antoine Primas.  This visit occurred during the SARS-CoV-2 public health emergency.  Safety protocols were in place, including screening questions prior to the visit, additional usage of staff PPE, and extensive cleaning of exam room while observing appropriate contact time as indicated for disinfecting solutions.   I'm seeing this patient by the request  of:  Panosh, Neta Mends, MD  CC: Back pain follow-up  [id number]  Sheri [name] is a 29 y.o. female coming in with complaint of back and neck pain. OMT 09/07/2019. Patient states she is not terrible but feels "different".  Patient notices the pain is more on the left side than usual right side.  Patient's pregnancy otherwise has been doing very well.  Medications patient has been prescribed: Nothing          Reviewed prior external information including notes and imaging from previsou exam, outside providers and external EMR if available.   As well as notes that were available from care everywhere and other healthcare systems.  Past medical history, social, surgical and family history all reviewed in electronic medical record.  No pertanent information unless stated regarding to the chief complaint.   Past Medical History:  Diagnosis Date  . Alcohol addiction (HCC)   . Anxiety   . Bipolar depression (HCC)    under psych rx   . Depression   . Drug addiction in remission [facility])    heroin  . Enteritis due to Norovirus 05/18/2016  . Hx of pulmonary embolus 02/13/2015  . Hyperlipidemia   . Hypokalemia 05/17/2016  . IUD (intrauterine device) in place 02/13/2015  . Mild mitral regurgitation   . Mild tricuspid regurgitation   . Mitral regurgitation 06/13/2018  . Near syncope 05/16/2016   a. felt due to norovirus.  . Nonallopathic lesion of cervical region  09/01/2018  . Nonallopathic lesion of rib cage 09/01/2018  . Obesity   . Orthostatic hypotension 05/18/2016  . PCOS (polycystic ovarian syndrome) 02/13/2015  . Pulmonary embolism (HCC) 2011   neg heme evaluation  felt to be from ocps   . Scapular dyskinesis 09/01/2018  . Smoker 12/30/2016  . Syncope and collapse 05/16/2016  . Thoracic outlet syndrome of left thoracic outlet 02/15/2018  . Thoracic radiculopathy due to degenerative joint disease of spine 10/13/2016   CT and XR show at least 3 levels of DDD of thoracic spine  I suspect this is related to both the scoliosis and the history of dance    Allergies  Allergen Reactions  . Bupropion Other (See Comments)     Review of Systems:  No headache, visual changes, nausea, vomiting, diarrhea, constipation, dizziness, abdominal pain, skin rash, fevers, chills, night sweats, weight loss, swollen lymph nodes,  joint swelling, chest pain, shortness of breath, mood changes. POSITIVE muscle aches, body aches  Objective  Blood pressure 126/70, pulse 98, height 5\' 3"  (1.6 m), weight 208 lb (94.3 kg), SpO2 98 %.   General: No apparent distress alert and oriented x3 mood and affect normal, dressed appropriately.  Gravid Back exam d.  Actually increase in lordosis more tenderness over the left sacroiliac joint  Osteopathic findings  C2 flexed rotated and side bent right C7 flexed rotated and side bent left T3 extended rotated and side bent right inhaled rib T9 extended rotated and side bent left L5 flexed rotated and side bent  right Sacrum r left on left       Assessment and Plan: Pain in thoracic spine History of the pain in the thoracic spine.  Due to patient's pregnancy I think this is contributing.  Patient is having increasing laxity as well in the lumbar line.  No medication changes.  Follow-up again in 3 to 4 weeks     Nonallopathic problems  Decision today to treat with OMT was based on Physical Exam  After verbal consent  patient was treated with HVLA, ME, FPR techniques in cervical, rib, thoracic, lumbar, and sacral  areas  Patient tolerated the procedure well with improvement in symptoms  Patient given exercises, stretches and lifestyle modifications  See medications in patient instructions if given  Patient will follow up in 4-8 weeks      The above documentation has been reviewed and is accurate and complete Lyndal Pulley, DO       Note: This dictation was prepared with Dragon dictation along with smaller phrase technology. Any transcriptional errors that result from this process are unintentional.

## 2019-10-05 NOTE — Patient Instructions (Addendum)
Good to see you Carpel tunnel brace at night Try to stretch a little See me again in 3 weeks ok to double book

## 2019-10-19 ENCOUNTER — Ambulatory Visit (INDEPENDENT_AMBULATORY_CARE_PROVIDER_SITE_OTHER): Payer: BC Managed Care – PPO | Admitting: Family Medicine

## 2019-10-19 ENCOUNTER — Other Ambulatory Visit: Payer: Self-pay

## 2019-10-19 ENCOUNTER — Encounter: Payer: Self-pay | Admitting: Family Medicine

## 2019-10-19 VITALS — BP 124/82 | HR 102 | Ht 63.0 in

## 2019-10-19 DIAGNOSIS — M546 Pain in thoracic spine: Secondary | ICD-10-CM

## 2019-10-19 DIAGNOSIS — M999 Biomechanical lesion, unspecified: Secondary | ICD-10-CM | POA: Diagnosis not present

## 2019-10-19 NOTE — Progress Notes (Signed)
[name] [address] [place] [address] Phone: [phone] Subjective:   Bruce Donath, am serving as a scribe for Dr. Antoine Primas. This visit occurred during the SARS-CoV-2 public health emergency.  Safety protocols were in place, including screening questions prior to the visit, additional usage of staff PPE, and extensive cleaning of exam room while observing appropriate contact time as indicated for disinfecting solutions.   I'm seeing this patient by the request  of:  Panosh, Neta Mends, MD  CC: Low back pain thoracic back pain follow-up  [id number]  Cambreigh Buffi Ewton is a 29 y.o. female coming in with complaint of back and neck pain. OMT 10/05/2019. Patient states that her hips are bothering her over greater trochanter and hip flexor.           Reviewed prior external information including notes and imaging from previsou exam, outside providers and external EMR if available.   As well as notes that were available from care everywhere and other healthcare systems.  Past medical history, social, surgical and family history all reviewed in electronic medical record.  No pertanent information unless stated regarding to the chief complaint.   Past Medical History:  Diagnosis Date  . Alcohol addiction (HCC)   . Anxiety   . Bipolar depression (HCC)    under psych rx   . Depression   . Drug addiction in remission [facility])    heroin  . Enteritis due to Norovirus 05/18/2016  . Hx of pulmonary embolus 02/13/2015  . Hyperlipidemia   . Hypokalemia 05/17/2016  . IUD (intrauterine device) in place 02/13/2015  . Mild mitral regurgitation   . Mild tricuspid regurgitation   . Mitral regurgitation 06/13/2018  . Near syncope 05/16/2016   a. felt due to norovirus.  . Nonallopathic lesion of cervical region 09/01/2018  . Nonallopathic lesion of rib cage 09/01/2018  . Obesity   . Orthostatic hypotension 05/18/2016  . PCOS (polycystic ovarian  syndrome) 02/13/2015  . Pulmonary embolism (HCC) 2011   neg heme evaluation  felt to be from ocps   . Scapular dyskinesis 09/01/2018  . Smoker 12/30/2016  . Syncope and collapse 05/16/2016  . Thoracic outlet syndrome of left thoracic outlet 02/15/2018  . Thoracic radiculopathy due to degenerative joint disease of spine 10/13/2016   CT and XR show at least 3 levels of DDD of thoracic spine  I suspect this is related to both the scoliosis and the history of dance    Allergies  Allergen Reactions  . Bupropion Other (See Comments)     Review of Systems:  No headache, visual changes, nausea, vomiting, diarrhea, constipation, dizziness, abdominal pain, skin rash, fevers, chills, night sweats, weight loss, swollen lymph nodes, body aches, joint swelling, chest pain, shortness of breath, mood changes. POSITIVE muscle aches  Objective  There were no vitals taken for this visit.   General: No apparent distress alert and oriented x3 mood and affect normal, dressed appropriately.  HEENT: Pupils equal, extraocular movements intact  Respiratory: Patient's speak in full sentences and does not appear short of breath  Cardiovascular: No lower extremity edema, non tender, no erythema  Neuro: Cranial nerves II through XII are intact, neurovascularly intact in all extremities with 2+ DTRs and 2+ pulses.  Gait normal with good balance and coordination.  MSK:  Non tender with full range of motion and good stability and symmetric strength and tone of shoulders, elbows, wrist, hip, knee and ankles bilaterally.  Back - Normal  skin, Spine with normal alignment and no deformity.  No tenderness to vertebral process palpation.  Paraspinous muscles are not tender and without spasm.   Range of motion is full at neck and lumbar sacral regions  Osteopathic findings C6 flexed rotated and side bent left T5 extended rotated and side bent right inhaled rib T8 extended rotated and side bent left L5 flexed rotated and side  bent left Sacrum left on left     Assessment and Plan: Degenerative disc thoracic, scapular dyskinesis pain in the thoracic spine.  Exacerbation.  Worsening secondary to the increase in progesterone as well as patient being gravid.  Encourage patient to continue to stay as active as possible.  Discussed the range of motion exercises and positioning.  Improvement will happen 6 weeks postpartum.  Continue with manipulation in 3 to 4-week intervals.   Nonallopathic problems  Decision today to treat with OMT was based on Physical Exam  After verbal consent patient was treated with HVLA, ME, FPR techniques in cervical, rib, thoracic, lumbar, and sacral  areas  Patient tolerated the procedure well with improvement in symptoms  Patient given exercises, stretches and lifestyle modifications  See medications in patient instructions if given  Patient will follow up in 4-8 weeks      The above documentation has been reviewed and is accurate and complete Judi Saa, DO       Note: This dictation was prepared with Dragon dictation along with smaller phrase technology. Any transcriptional errors that result from this process are unintentional.

## 2019-10-19 NOTE — Patient Instructions (Signed)
Have a relaxing 4th! Do whatever you want See me in 3 weeks-ok to double book

## 2019-11-08 NOTE — Telephone Encounter (Signed)
If every one has had varicella the disease  or the immunizations  And  All lesions are covered  And no skin to skin contact or physical contact  Every one should be good

## 2019-11-13 ENCOUNTER — Ambulatory Visit: Payer: BC Managed Care – PPO | Admitting: Family Medicine

## 2019-11-24 LAB — OB RESULTS CONSOLE GBS: GBS: POSITIVE

## 2019-11-27 ENCOUNTER — Telehealth (HOSPITAL_COMMUNITY): Payer: Self-pay | Admitting: *Deleted

## 2019-11-27 NOTE — Telephone Encounter (Signed)
Preadmission screen  

## 2019-11-28 ENCOUNTER — Telehealth (HOSPITAL_COMMUNITY): Payer: Self-pay | Admitting: *Deleted

## 2019-11-28 ENCOUNTER — Encounter (HOSPITAL_COMMUNITY): Payer: Self-pay | Admitting: *Deleted

## 2019-11-28 NOTE — Telephone Encounter (Signed)
Preadmission screen  

## 2019-12-08 ENCOUNTER — Other Ambulatory Visit: Payer: Self-pay | Admitting: Obstetrics

## 2019-12-09 ENCOUNTER — Other Ambulatory Visit (HOSPITAL_COMMUNITY)
Admission: RE | Admit: 2019-12-09 | Discharge: 2019-12-09 | Disposition: A | Discharge status: Home / Self Care | Payer: BC Managed Care – PPO | Source: Ambulatory Visit | Attending: Obstetrics | Admitting: Obstetrics

## 2019-12-09 DIAGNOSIS — Z20822 Contact with and (suspected) exposure to covid-19: Secondary | ICD-10-CM | POA: Insufficient documentation

## 2019-12-09 LAB — SARS CORONAVIRUS 2 (TAT 6-24 HRS): SARS Coronavirus 2: NEGATIVE

## 2019-12-10 NOTE — H&P (Signed)
Sheri [name] is a 29 y.o. G1P0000 at [redacted]w[redacted]d presenting for IOL in a heparin window. Pt notes occasional contractions. Good fetal movement, No vaginal bleeding, not leaking fluid.  [facility] at [facility] since 6 wks - Dated by 6 wk u/s, unsure LMP - h/o PCOS, on metformin through pregnancy, 500mg  bid, passed DS (early off metformin- 113; 28 wk screening on metformin 129) - partner with HSV, pt w/o prior lesions, partner on prophylactic Valtrex throughout preg, pt on prophylactic Valtrex from 36 wks - h/o PE, while on COC, negative thrombophilia w/u, on Lovenox 40mg  daily through pregnancy, switched to heparin 10,000 units bid at 36 wks, last dose am 8/22, hold for delivery. Plan to resume Lovenox x 12 wks PP - h/o narcotic abuse. In long term recovery.  Pt has been following with NA, has sponsor, has plan for pain management. Great support. - h/o anxiety/ depression, on Buspar 7.5 bid and Zoloft 50mg  daily - normal maternal echo in 3rd trimester due to concerns over heart damage at the time of PE - GERD, Pepcid - fetal growth, 34 wks, 5'7/ 45%   Prenatal Transfer Tool  Maternal Diabetes: No Genetic Screening: Normal Maternal Ultrasounds/Referrals: Normal Fetal Ultrasounds or other Referrals:  None Maternal Substance Abuse:  No Significant Maternal Medications:  None Significant Maternal Lab Results: Group B Strep positive     OB History    Gravida  1   Para  0   Term  0   Preterm  0   AB  0   Living  0     SAB  0   TAB  0   Ectopic  0   Multiple  0   Live Births             Past Medical History:  Diagnosis Date  . Alcohol addiction (HCC)   . Anxiety   . Bipolar depression (HCC)    under psych rx   . Depression   . Drug addiction in remission [facility])    heroin  . Enteritis due to Norovirus 05/18/2016  . Hx of pulmonary embolus 02/13/2015  . Hyperlipidemia   . Hypokalemia 05/17/2016  . IUD (intrauterine device) in place 02/13/2015  . Mild mitral  regurgitation   . Mild tricuspid regurgitation   . Mitral regurgitation 06/13/2018  . Near syncope 05/16/2016   a. felt due to norovirus.  . Nonallopathic lesion of cervical region 09/01/2018  . Nonallopathic lesion of rib cage 09/01/2018  . Obesity   . Orthostatic hypotension 05/18/2016  . PCOS (polycystic ovarian syndrome) 02/13/2015  . Pulmonary embolism (HCC) 2011   neg heme evaluation  felt to be from ocps   . Scapular dyskinesis 09/01/2018  . Smoker 12/30/2016  . Syncope and collapse 05/16/2016  . Thoracic outlet syndrome of left thoracic outlet 02/15/2018  . Thoracic radiculopathy due to degenerative joint disease of spine 10/13/2016   CT and XR show at least 3 levels of DDD of thoracic spine  I suspect this is related to both the scoliosis and the history of dance   Past Surgical History:  Procedure Laterality Date  . NO PAST SURGERIES     Family History: family history includes Colon cancer in her maternal grandfather; Hypertension in her father; Irritable bowel syndrome in her father. Social History:  reports that she quit smoking about 3 years ago. She has never used smokeless tobacco. She reports that she does not drink alcohol and does not use drugs.  Review of Systems -  Negative except discomfort of pregnancy    Physical Exam  Prenatal labs: ABO, Rh: A/Positive/-- (12/17 0000) Antibody: Negative (12/17 0000) Rubella: Immune (02/04 0000) RPR: Nonreactive (02/04 0000)  HBsAg: Negative (02/04 0000)  HIV: Non-reactive (02/04 0000)  GBS: Positive/-- (08/06 0000)  1 hr Glucola 129 (on metformin)  Genetic screening nl NT, nl AFP Anatomy US normal   Assessment/Plan: 29 y.o. G1P0000 at [redacted]w[redacted]d - IOL, term, h/o prior PE, on Lovenox then heparin through preg, IOL in heparin window, last dose 10,000 units am 8/22 - IOL, plan cytotec o/n, pit in am, AROM when in active labor, possible cervical foley bulb -h/o narcotic abuse, in long term recovery. Pt with excellent support  system, wants to be treated appropriately if in pain and aware of risks of relapse. See pt's self plan for further details. - GBS pos. PCN.  - PCOS, cont metformin PP   Lendon Colonel 12/10/2019, 10:13 PM  Pt received single dose cytotec on admission, entered active labor, received epidural. PCN not started upon enterin active labor, but started around 530 am.   Vitals:   12/11/19 0500 12/11/19 0530 12/11/19 0609 12/11/19 0630  BP: 124/63 131/80 118/63 (!) 94/42  Pulse: 95 (!) 116 83 85  Resp:      Temp:      TempSrc:      SpO2:      Weight:      Height:       CBC    Component Value Date/Time   WBC 13.4 (H) 12/11/2019 0049   RBC 4.01 12/11/2019 0049   HGB 11.5 (L) 12/11/2019 0049   HCT 35.2 (L) 12/11/2019 0049   PLT 261 12/11/2019 0049   MCV 87.8 12/11/2019 0049   MCH 28.7 12/11/2019 0049   MCHC 32.7 12/11/2019 0049   RDW 14.6 12/11/2019 0049   LYMPHSABS 1.6 05/23/2019 0940   MONOABS 0.7 05/23/2019 0940   EOSABS 0.2 05/23/2019 0940   BASOSABS 0.1 05/23/2019 0940    A/P; IOL, quick progress on cytotec x 1. PCN given. Expectant management, AROM to expedite delivery after 3 hrs PCN or if needed sooner for maternal or fetal status.  Lendon Colonel 12/11/2019 8:03 AM

## 2019-12-11 ENCOUNTER — Inpatient Hospital Stay (HOSPITAL_COMMUNITY)
Admission: AD | Admit: 2019-12-11 | Discharge: 2019-12-13 | DRG: 806 | Disposition: A | Discharge status: Home / Self Care | Payer: BC Managed Care – PPO | Attending: Obstetrics | Admitting: Obstetrics

## 2019-12-11 ENCOUNTER — Inpatient Hospital Stay (HOSPITAL_COMMUNITY): Payer: BC Managed Care – PPO | Admitting: Anesthesiology

## 2019-12-11 ENCOUNTER — Other Ambulatory Visit: Payer: Self-pay

## 2019-12-11 ENCOUNTER — Inpatient Hospital Stay (HOSPITAL_COMMUNITY): Payer: BC Managed Care – PPO

## 2019-12-11 ENCOUNTER — Encounter (HOSPITAL_COMMUNITY): Payer: Self-pay | Admitting: Obstetrics

## 2019-12-11 DIAGNOSIS — O26893 Other specified pregnancy related conditions, third trimester: Secondary | ICD-10-CM | POA: Diagnosis present

## 2019-12-11 DIAGNOSIS — F1111 Opioid abuse, in remission: Secondary | ICD-10-CM | POA: Diagnosis present

## 2019-12-11 DIAGNOSIS — F419 Anxiety disorder, unspecified: Secondary | ICD-10-CM | POA: Diagnosis present

## 2019-12-11 DIAGNOSIS — O99344 Other mental disorders complicating childbirth: Secondary | ICD-10-CM | POA: Diagnosis present

## 2019-12-11 DIAGNOSIS — Z7901 Long term (current) use of anticoagulants: Secondary | ICD-10-CM

## 2019-12-11 DIAGNOSIS — Z20822 Contact with and (suspected) exposure to covid-19: Secondary | ICD-10-CM | POA: Diagnosis present

## 2019-12-11 DIAGNOSIS — O99824 Streptococcus B carrier state complicating childbirth: Secondary | ICD-10-CM | POA: Diagnosis present

## 2019-12-11 DIAGNOSIS — R338 Other retention of urine: Secondary | ICD-10-CM | POA: Diagnosis not present

## 2019-12-11 DIAGNOSIS — E282 Polycystic ovarian syndrome: Secondary | ICD-10-CM | POA: Diagnosis present

## 2019-12-11 DIAGNOSIS — Z3490 Encounter for supervision of normal pregnancy, unspecified, unspecified trimester: Secondary | ICD-10-CM | POA: Diagnosis present

## 2019-12-11 DIAGNOSIS — R339 Retention of urine, unspecified: Secondary | ICD-10-CM | POA: Diagnosis present

## 2019-12-11 DIAGNOSIS — Z86711 Personal history of pulmonary embolism: Secondary | ICD-10-CM | POA: Diagnosis present

## 2019-12-11 DIAGNOSIS — O99893 Other specified diseases and conditions complicating puerperium: Secondary | ICD-10-CM | POA: Diagnosis present

## 2019-12-11 DIAGNOSIS — O99284 Endocrine, nutritional and metabolic diseases complicating childbirth: Secondary | ICD-10-CM | POA: Diagnosis present

## 2019-12-11 DIAGNOSIS — O9962 Diseases of the digestive system complicating childbirth: Secondary | ICD-10-CM | POA: Diagnosis present

## 2019-12-11 DIAGNOSIS — K219 Gastro-esophageal reflux disease without esophagitis: Secondary | ICD-10-CM | POA: Diagnosis present

## 2019-12-11 DIAGNOSIS — F39 Unspecified mood [affective] disorder: Secondary | ICD-10-CM | POA: Diagnosis present

## 2019-12-11 DIAGNOSIS — Z87891 Personal history of nicotine dependence: Secondary | ICD-10-CM | POA: Diagnosis not present

## 2019-12-11 DIAGNOSIS — F3130 Bipolar disorder, current episode depressed, mild or moderate severity, unspecified: Secondary | ICD-10-CM | POA: Diagnosis present

## 2019-12-11 DIAGNOSIS — O9081 Anemia of the puerperium: Secondary | ICD-10-CM | POA: Diagnosis not present

## 2019-12-11 DIAGNOSIS — D62 Acute posthemorrhagic anemia: Secondary | ICD-10-CM | POA: Diagnosis not present

## 2019-12-11 DIAGNOSIS — Z3A39 39 weeks gestation of pregnancy: Secondary | ICD-10-CM

## 2019-12-11 DIAGNOSIS — Z8742 Personal history of other diseases of the female genital tract: Secondary | ICD-10-CM

## 2019-12-11 DIAGNOSIS — O9902 Anemia complicating childbirth: Secondary | ICD-10-CM | POA: Diagnosis present

## 2019-12-11 LAB — CBC
HCT: 35.2 % — ABNORMAL LOW (ref 36.0–46.0)
Hemoglobin: 11.5 g/dL — ABNORMAL LOW (ref 12.0–15.0)
MCH: 28.7 pg (ref 26.0–34.0)
MCHC: 32.7 g/dL (ref 30.0–36.0)
MCV: 87.8 fL (ref 80.0–100.0)
Platelets: 261 10*3/uL (ref 150–400)
RBC: 4.01 MIL/uL (ref 3.87–5.11)
RDW: 14.6 % (ref 11.5–15.5)
WBC: 13.4 10*3/uL — ABNORMAL HIGH (ref 4.0–10.5)
nRBC: 0 % (ref 0.0–0.2)

## 2019-12-11 LAB — TYPE AND SCREEN
ABO/RH(D): A POS
Antibody Screen: NEGATIVE

## 2019-12-11 LAB — RPR: RPR Ser Ql: NONREACTIVE

## 2019-12-11 LAB — APTT: aPTT: 29 seconds (ref 24–36)

## 2019-12-11 LAB — PROTIME-INR
INR: 1 (ref 0.8–1.2)
Prothrombin Time: 12.8 seconds (ref 11.4–15.2)

## 2019-12-11 MED ORDER — WITCH HAZEL-GLYCERIN EX PADS: 1.0000 "application " | MEDICATED_PAD | CUTANEOUS | Status: DC | PRN

## 2019-12-11 MED ORDER — BUSPIRONE HCL 15 MG PO TABS
7.5000 mg | ORAL_TABLET | Freq: Two times a day (BID) | ORAL | Status: DC
  Administered 2019-12-11 – 2019-12-13 (×5): 7.5 mg via ORAL
  Filled 2019-12-11 (×7): qty 1

## 2019-12-11 MED ORDER — ENOXAPARIN SODIUM 40 MG/0.4ML ~~LOC~~ SOLN: 40.0000 mg | SUBCUTANEOUS | Status: DC

## 2019-12-11 MED ORDER — PENICILLIN G POT IN DEXTROSE 60000 UNIT/ML IV SOLN: 3.0000 10*6.[IU] | INTRAVENOUS | Status: DC

## 2019-12-11 MED ORDER — EPHEDRINE 5 MG/ML INJ: 10.0000 mg | INTRAVENOUS | Status: DC | PRN

## 2019-12-11 MED ORDER — OXYTOCIN BOLUS FROM INFUSION
333.0000 mL | Freq: Once | INTRAVENOUS | Status: AC
  Administered 2019-12-11: 333 mL via INTRAVENOUS

## 2019-12-11 MED ORDER — DIPHENHYDRAMINE HCL 50 MG/ML IJ SOLN: 12.5000 mg | INTRAMUSCULAR | Status: DC | PRN

## 2019-12-11 MED ORDER — COCONUT OIL OIL
1.0000 "application " | TOPICAL_OIL | Status: DC | PRN
  Administered 2019-12-13: 1 via TOPICAL

## 2019-12-11 MED ORDER — LIDOCAINE HCL (PF) 1 % IJ SOLN: 30.0000 mL | INTRAMUSCULAR | Status: DC | PRN

## 2019-12-11 MED ORDER — ACETAMINOPHEN 325 MG PO TABS
650.0000 mg | ORAL_TABLET | ORAL | Status: DC | PRN
  Administered 2019-12-11: 650 mg via ORAL
  Filled 2019-12-11: qty 2

## 2019-12-11 MED ORDER — TERBUTALINE SULFATE 1 MG/ML IJ SOLN: 0.2500 mg | Freq: Once | INTRAMUSCULAR | Status: DC | PRN

## 2019-12-11 MED ORDER — MISOPROSTOL 25 MCG QUARTER TABLET
25.0000 ug | ORAL_TABLET | ORAL | Status: DC | PRN
  Administered 2019-12-11: 25 ug via VAGINAL
  Filled 2019-12-11: qty 1

## 2019-12-11 MED ORDER — FENTANYL CITRATE (PF) 100 MCG/2ML IJ SOLN: 50.0000 ug | Freq: Once | INTRAMUSCULAR | Status: DC | PRN

## 2019-12-11 MED ORDER — BENZOCAINE-MENTHOL 20-0.5 % EX AERO
1.0000 "application " | INHALATION_SPRAY | CUTANEOUS | Status: DC | PRN
  Administered 2019-12-11: 1 via TOPICAL
  Filled 2019-12-11: qty 56

## 2019-12-11 MED ORDER — FENTANYL-BUPIVACAINE-NACL 0.5-0.125-0.9 MG/250ML-% EP SOLN
12.0000 mL/h | EPIDURAL | Status: DC | PRN
  Filled 2019-12-11: qty 250

## 2019-12-11 MED ORDER — ENOXAPARIN SODIUM 60 MG/0.6ML ~~LOC~~ SOLN
50.0000 mg | SUBCUTANEOUS | Status: DC
  Administered 2019-12-12 – 2019-12-13 (×2): 50 mg via SUBCUTANEOUS
  Filled 2019-12-11 (×2): qty 0.6

## 2019-12-11 MED ORDER — PENICILLIN G POT IN DEXTROSE 60000 UNIT/ML IV SOLN
3.0000 10*6.[IU] | INTRAVENOUS | Status: DC
  Administered 2019-12-11: 3 10*6.[IU] via INTRAVENOUS
  Filled 2019-12-11: qty 50

## 2019-12-11 MED ORDER — SERTRALINE HCL 50 MG PO TABS
75.0000 mg | ORAL_TABLET | Freq: Every day | ORAL | Status: DC
  Administered 2019-12-11 – 2019-12-12 (×2): 75 mg via ORAL
  Filled 2019-12-11 (×2): qty 1

## 2019-12-11 MED ORDER — FENTANYL CITRATE (PF) 2500 MCG/50ML IJ SOLN
INTRAMUSCULAR | Status: DC | PRN
Start: 2019-12-11 — End: 2019-12-11
  Administered 2019-12-11: 12 mL/h via EPIDURAL

## 2019-12-11 MED ORDER — OXYTOCIN-SODIUM CHLORIDE 30-0.9 UT/500ML-% IV SOLN
1.0000 m[IU]/min | INTRAVENOUS | Status: DC
  Filled 2019-12-11: qty 500

## 2019-12-11 MED ORDER — DIPHENHYDRAMINE HCL 25 MG PO CAPS: 25.0000 mg | ORAL_CAPSULE | Freq: Four times a day (QID) | ORAL | Status: DC | PRN

## 2019-12-11 MED ORDER — SODIUM CHLORIDE 0.9 % IV SOLN
5.0000 10*6.[IU] | Freq: Once | INTRAVENOUS | Status: DC
  Filled 2019-12-11: qty 5

## 2019-12-11 MED ORDER — SODIUM CHLORIDE 0.9 % IV SOLN
5.0000 10*6.[IU] | Freq: Once | INTRAVENOUS | Status: AC
  Administered 2019-12-11: 5 10*6.[IU] via INTRAVENOUS

## 2019-12-11 MED ORDER — ACETAMINOPHEN 325 MG PO TABS
650.0000 mg | ORAL_TABLET | ORAL | Status: DC | PRN
  Administered 2019-12-11 – 2019-12-12 (×3): 650 mg via ORAL
  Filled 2019-12-11 (×4): qty 2

## 2019-12-11 MED ORDER — PRENATAL MULTIVITAMIN CH
1.0000 | ORAL_TABLET | Freq: Every day | ORAL | Status: DC
  Administered 2019-12-12 – 2019-12-13 (×2): 1 via ORAL
  Filled 2019-12-11 (×2): qty 1

## 2019-12-11 MED ORDER — SIMETHICONE 80 MG PO CHEW: 80.0000 mg | CHEWABLE_TABLET | ORAL | Status: DC | PRN

## 2019-12-11 MED ORDER — LACTATED RINGERS IV SOLN: INTRAVENOUS | Status: DC

## 2019-12-11 MED ORDER — PHENYLEPHRINE 40 MCG/ML (10ML) SYRINGE FOR IV PUSH (FOR BLOOD PRESSURE SUPPORT): 80.0000 ug | PREFILLED_SYRINGE | INTRAVENOUS | Status: DC | PRN

## 2019-12-11 MED ORDER — LIDOCAINE 5 % EX OINT
TOPICAL_OINTMENT | Freq: Four times a day (QID) | CUTANEOUS | Status: DC | PRN
  Administered 2019-12-11: 1 via TOPICAL
  Filled 2019-12-11: qty 35.44

## 2019-12-11 MED ORDER — SOD CITRATE-CITRIC ACID 500-334 MG/5ML PO SOLN: 30.0000 mL | ORAL | Status: DC | PRN

## 2019-12-11 MED ORDER — SENNOSIDES-DOCUSATE SODIUM 8.6-50 MG PO TABS
2.0000 | ORAL_TABLET | ORAL | Status: DC
  Administered 2019-12-12 (×2): 2 via ORAL
  Filled 2019-12-11 (×2): qty 2

## 2019-12-11 MED ORDER — ZOLPIDEM TARTRATE 5 MG PO TABS: 5.0000 mg | ORAL_TABLET | Freq: Every evening | ORAL | Status: DC | PRN

## 2019-12-11 MED ORDER — IBUPROFEN 600 MG PO TABS
600.0000 mg | ORAL_TABLET | Freq: Four times a day (QID) | ORAL | Status: DC
  Administered 2019-12-11 – 2019-12-13 (×8): 600 mg via ORAL
  Filled 2019-12-11 (×8): qty 1

## 2019-12-11 MED ORDER — ONDANSETRON HCL 4 MG/2ML IJ SOLN
4.0000 mg | Freq: Four times a day (QID) | INTRAMUSCULAR | Status: DC | PRN
  Administered 2019-12-11: 4 mg via INTRAVENOUS
  Filled 2019-12-11: qty 2

## 2019-12-11 MED ORDER — LACTATED RINGERS IV SOLN: 500.0000 mL | INTRAVENOUS | Status: DC | PRN

## 2019-12-11 MED ORDER — DIBUCAINE (PERIANAL) 1 % EX OINT: 1.0000 "application " | TOPICAL_OINTMENT | CUTANEOUS | Status: DC | PRN

## 2019-12-11 MED ORDER — LACTATED RINGERS IV SOLN: 500.0000 mL | Freq: Once | INTRAVENOUS | Status: DC

## 2019-12-11 MED ORDER — ONDANSETRON HCL 4 MG PO TABS: 4.0000 mg | ORAL_TABLET | ORAL | Status: DC | PRN

## 2019-12-11 MED ORDER — OXYTOCIN-SODIUM CHLORIDE 30-0.9 UT/500ML-% IV SOLN: 2.5000 [IU]/h | INTRAVENOUS | Status: DC

## 2019-12-11 MED ORDER — LIDOCAINE HCL (PF) 1 % IJ SOLN
INTRAMUSCULAR | Status: DC | PRN
  Administered 2019-12-11: 10 mL via EPIDURAL

## 2019-12-11 MED ORDER — FENTANYL CITRATE (PF) 100 MCG/2ML IJ SOLN
50.0000 ug | Freq: Once | INTRAMUSCULAR | Status: AC
  Administered 2019-12-11: 50 ug via INTRAVENOUS
  Filled 2019-12-11: qty 2

## 2019-12-11 MED ORDER — ONDANSETRON HCL 4 MG/2ML IJ SOLN: 4.0000 mg | INTRAMUSCULAR | Status: DC | PRN

## 2019-12-11 NOTE — Progress Notes (Signed)
Assisted patient to bathroom via steady. She complains of pelvic pain, uterus deviated to the right, lochia WNL. Urinary catheter was removed at 1005 and patient has not voided. Patient feeling the urge to void but unable, she became very emotional, panicking and feeling like she is going to faint. Help called. Patient remained stable and was able to calm her and get her to bed. Patient states she did not take her Buspar this am and thinks she needs to have it. Family requesting pain medication for patient. Ibuprofen was given. Informed patient that I would call Dr. Ernestina Penna for Buspar and give her an update. Patient reports having a good rapport with Dr. Ernestina Penna and states she has been very helpful with managing her care including her mental health during this pregnancy. Dr. Ernestina Penna gave verbal order for Buspar and on her way to assess patient.

## 2019-12-11 NOTE — Progress Notes (Signed)
Sheri [name] is a 29 y.o. G1P0000 at [redacted]w[redacted]d elective IOL for hx of PE on prophylactic anticoagulation in the pregnancy  Subjective: Patient feeling a lot of pressure around 0930 with desire to push Patient now pushing with excellent effort. Comfortable in between ctxs with epidural in place  Objective: BP 138/81   Pulse 99   Temp 98.4 F (36.9 C) (Axillary)   Resp 18   Ht 5\' 3"  (1.6 m)   Wt 98.4 kg   SpO2 100%   BMI 38.44 kg/m  No intake/output data recorded. No intake/output data recorded.  FHT:  FHR: 120 bpm, variability: moderate,  accelerations:  Present,  decelerations:  Present early with contractions UC:   irregular, every 2 minutes SVE:   Dilation: 10 Effacement (%): 100 Station: -1 Exam by:: m wilkins rnc AROM light mec   Labs: Lab Results  Component Value Date   WBC 13.4 (H) 12/11/2019   HGB 11.5 (L) 12/11/2019   HCT 35.2 (L) 12/11/2019   MCV 87.8 12/11/2019   PLT 261 12/11/2019    Assessment / Plan: Induction of labor due to [facility] medical conditions,  progressing well on pitocin  Hx of PE, on Lovenox 40mg  Sheri [name] daily, transitioned to UFH at 36w 10,000U BID, last dose of Heparin 10AM 8/22  Labor: Progressing normally on Pitocin, now s/p AROM  Fetal Wellbeing:  Category I Pain Control:  Epidural I/D:  n/a Anticipated MOD:  NSVD  Sheri [name] 12/11/2019, 10:03 AM

## 2019-12-11 NOTE — Anesthesia Postprocedure Evaluation (Signed)
Anesthesia Post Note  Patient: Sheri [name]  Procedure(s) Performed: AN AD HOC LABOR EPIDURAL     Patient location during evaluation: Mother Baby Anesthesia Type: Epidural Level of consciousness: awake and alert and oriented Pain management: satisfactory to patient Vital Signs Assessment: post-procedure vital signs reviewed and stable Respiratory status: respiratory function stable Cardiovascular status: stable Postop Assessment: no headache, no backache, epidural receding, patient able to bend at knees, no signs of nausea or vomiting, adequate PO intake and able to ambulate Anesthetic complications: no   No complications documented.  Last Vitals:  Vitals:   12/11/19 1147 12/11/19 1202  BP: (!) 156/96 (!) 159/95  Pulse: (!) 104 (!) 102  Resp:    Temp:    SpO2:      Last Pain:  Vitals:   12/11/19 0045  TempSrc:   PainSc: 0-No pain   Pain Goal:                   [name]

## 2019-12-11 NOTE — Progress Notes (Signed)
Patient ate lunch, calm and talking with family. Buspar 7.5 mg given as ordered. Patient requesting to go the bathroom. Bladder scan indicates greater than 999 ml. Patient tol OOB well, voided with relief, returned to bed in good spirits.

## 2019-12-11 NOTE — Anesthesia Preprocedure Evaluation (Signed)
Anesthesia Evaluation  Patient identified by MRN, date of birth, ID band Patient awake    Reviewed: Allergy & Precautions, H&P , NPO status , Patient's Chart, lab work & pertinent test results  History of Anesthesia Complications Negative for: history of anesthetic complications  Airway Mallampati: II  TM Distance: >3 FB Neck ROM: full    Dental no notable dental hx.    Pulmonary former smoker, PE   Pulmonary exam normal        Cardiovascular + DVT  Normal cardiovascular exam Rhythm:regular Rate:Normal     Neuro/Psych Anxiety Depression Bipolar Disorder negative neurological ROS     GI/Hepatic negative GI ROS, (+)     substance abuse  alcohol use and IV drug use,   Endo/Other  negative endocrine ROS  Renal/GU negative Renal ROS  negative genitourinary   Musculoskeletal   Abdominal   Peds  Hematology negative hematology ROS (+)   Anesthesia Other Findings  On SQ heparin for h/o DVT, last dose at 10am on 8/22  Reproductive/Obstetrics (+) Pregnancy                            Anesthesia Physical Anesthesia Plan  ASA: III  Anesthesia Plan: Epidural   Post-op Pain Management:    Induction:   PONV Risk Score and Plan:   Airway Management Planned:   Additional Equipment:   Intra-op Plan:   Post-operative Plan:   Informed Consent: I have reviewed the patients History and Physical, chart, labs and discussed the procedure including the risks, benefits and alternatives for the proposed anesthesia with the patient or authorized representative who has indicated his/her understanding and acceptance.       Plan Discussed with:   Anesthesia Plan Comments:         Anesthesia Quick Evaluation

## 2019-12-11 NOTE — Progress Notes (Signed)
2200 ml emptied from foley catheter. Patient smiling and in good spirits. Denies pain and tolerating sitting up inh chair.

## 2019-12-11 NOTE — Anesthesia Procedure Notes (Signed)
Epidural Patient location during procedure: OB Start time: 12/11/2019 3:42 AM End time: 12/11/2019 3:52 AM  Staffing Anesthesiologist: Lucretia Kern, MD Performed: anesthesiologist   Preanesthetic Checklist Completed: patient identified, IV checked, risks and benefits discussed, monitors and equipment checked, pre-op evaluation and timeout performed  Epidural Patient position: sitting Prep: DuraPrep Patient monitoring: heart rate, continuous pulse ox and blood pressure Approach: midline Location: L3-L4 Injection technique: LOR air  Needle:  Needle type: Tuohy  Needle gauge: 17 G Needle length: 9 cm Needle insertion depth: 6 cm Catheter type: closed end flexible Catheter size: 19 Gauge Catheter at skin depth: 11 cm Test dose: negative  Assessment Events: blood not aspirated, injection not painful, no injection resistance, no paresthesia and negative IV test  Additional Notes Reason for block:procedure for pain

## 2019-12-11 NOTE — Progress Notes (Signed)
Patient bladder scanned again once Dr. Ernestina Penna arrived and bladder scan still indicating >999 ml. Dr. Ernestina Penna discussed the risk of an overdistended bladder and pt agreed to an indwelling catheter. 700 ml of urine drained in the first 5 minutes and patient feeling better relief and pelvic pressure. Will leave catheter in overnight and do bladder trial in the am per order.

## 2019-12-11 NOTE — Progress Notes (Signed)
CTSP for increased abdominal pain  Pt notes increased abdominal pain, noted that pt had not voided in the 4 hrs since delivery. Pt anxiety spiked with inability to void, pain and baby with low BS. Trying to avoid narcotics due to h/o narcotic abuse, in long term recovery.   A/P: bladder scan and re-eval   Nurse reports bladder scan >999, pt then able to void, though amount not measured. Pt ate, feeling less anxious and no longer with pain.  A/P: repeat bladder scan   I arrived in room, pt comfortable. Bladder scan now >999, just 30 min after the reported void.  On exam uterus at umbilicus, slight tenderness present. Decision made to place catheter.  Sterile technique, betadine x 3, foley placed uneventfully. Steady drain through small catheter over 30 min- 1600 cc. Will plan to leave foley til am then plan trial of void. Pt agrees. Nurse instructed on TOV  Lendon Colonel 12/11/2019 4:25 PM

## 2019-12-12 DIAGNOSIS — O9902 Anemia complicating childbirth: Secondary | ICD-10-CM | POA: Diagnosis present

## 2019-12-12 DIAGNOSIS — F39 Unspecified mood [affective] disorder: Secondary | ICD-10-CM | POA: Diagnosis present

## 2019-12-12 DIAGNOSIS — R338 Other retention of urine: Secondary | ICD-10-CM | POA: Diagnosis not present

## 2019-12-12 DIAGNOSIS — Z8742 Personal history of other diseases of the female genital tract: Secondary | ICD-10-CM

## 2019-12-12 LAB — CBC
HCT: 28.3 % — ABNORMAL LOW (ref 36.0–46.0)
Hemoglobin: 9.3 g/dL — ABNORMAL LOW (ref 12.0–15.0)
MCH: 29.6 pg (ref 26.0–34.0)
MCHC: 32.9 g/dL (ref 30.0–36.0)
MCV: 90.1 fL (ref 80.0–100.0)
Platelets: 243 10*3/uL (ref 150–400)
RBC: 3.14 MIL/uL — ABNORMAL LOW (ref 3.87–5.11)
RDW: 15.1 % (ref 11.5–15.5)
WBC: 14.1 10*3/uL — ABNORMAL HIGH (ref 4.0–10.5)
nRBC: 0 % (ref 0.0–0.2)

## 2019-12-12 MED ORDER — MAGNESIUM OXIDE 400 (241.3 MG) MG PO TABS
400.0000 mg | ORAL_TABLET | Freq: Every day | ORAL | Status: DC
  Administered 2019-12-13: 400 mg via ORAL
  Filled 2019-12-12: qty 1

## 2019-12-12 MED ORDER — METFORMIN HCL 500 MG PO TABS
500.0000 mg | ORAL_TABLET | Freq: Two times a day (BID) | ORAL | Status: DC
  Administered 2019-12-12 – 2019-12-13 (×2): 500 mg via ORAL
  Filled 2019-12-12 (×2): qty 1

## 2019-12-12 MED ORDER — POLYSACCHARIDE IRON COMPLEX 150 MG PO CAPS
150.0000 mg | ORAL_CAPSULE | Freq: Every day | ORAL | Status: DC
  Administered 2019-12-13: 150 mg via ORAL
  Filled 2019-12-12: qty 1

## 2019-12-12 NOTE — Progress Notes (Signed)
CSW received consult for hx of Anxiety and Depression.  CSW met with MOB to offer support and complete assessment.    CSW congratulated MOB and FOB on the birth of infant. CSW advised MOB of CSW's role and the reason for CSW coming to speak with her. MOB reported that she was diagnosed with anxiety and depression around the age of 46-16. MOB reported a hx of medication use with current use of medication to help manage depression and anxiety. MOB expressed that she also has a therapist and psychiatrist that she sees. MOB informed CSW that she already has follow up care scheduled  for her psychiatrist. MOB reported that she feels that with medication and therapy use her anxiety and depression is well controlled. MOB reported no other mental health hx although chart notes that MOB has a hx of Bipolar. When asked MOB denied having this as a diagnosis. MOB also denies SI and HI and reported feeling well since giving birth.   CSW was advised that MOB has support from spouse at this time. MOB reports that she has all needed items to care for infant with no other needs at this time.   CSW provided education regarding the baby blues period vs. perinatal mood disorders.  CSW recommends self-evaluation during the postpartum time period using the New Mom Checklist from Postpartum Progress and encouraged MOB to contact a medical professional if symptoms are noted at any time.   CSW provided review of Sudden Infant Death Syndrome (SIDS) precautions.  MOB expressed that infant would sleep in mini crib/basinet once arrived home.    CSW identifies no further need for intervention and no barriers to discharge at this time.   Virgie Dad Chassie Pennix, MSW, LCSW [facility] [place] [phone]

## 2019-12-12 NOTE — Progress Notes (Signed)
PPD # 1 S/P NSVD  Live born female  Birth Weight: 7 lb 0.7 oz (3195 g) APGAR: 8, 9  Newborn Delivery   Birth date/time: 12/11/2019 10:14:00 Delivery type: Vaginal, Spontaneous     Baby name: Sheri [name] Delivering provider: Clance Boll A  Episiotomy:None   Lacerations:2nd degree;Perineal   Feeding: breast  Pain control at delivery: Epidural   S:  Reports feeling well, no further pain, foley cath in place overnight for urinary retention after delivery yesterday.              Tolerating po/ No nausea or vomiting             Bleeding is light             Pain controlled with PO meds             Up ad lib / ambulatory   O:  A & O x 3, in no apparent distress              VS:  Vitals:   12/11/19 1737 12/11/19 2144 12/12/19 0042 12/12/19 0500  BP: 119/66 106/73 128/72 107/68  Pulse: 100 (!) 108 100 96  Resp: 20 18 18 16   Temp: 98.6 F (37 C) 98.7 F (37.1 C) 98.2 F (36.8 C) 98.1 F (36.7 C)  TempSrc: Oral Oral Oral Oral  SpO2: 97% 100% 100% 100%  Weight:      Height:        LABS:  Recent Labs    12/11/19 0049 12/12/19 0600  WBC 13.4* 14.1*  HGB 11.5* 9.3*  HCT 35.2* 28.3*  PLT 261 243    Blood type: --/--/A POS (08/23 0049)  Rubella: Immune (02/04 0000)   I&O: I/O last 3 completed shifts: In: -  Out: 4250 [Urine:4000; Blood:250]          No intake/output data recorded.  Vaccines: TDaP UTD         Flu    UTD   Gen: AAO x 3, NAD  Abdomen: soft, non-tender, non-distended             Fundus: firm, non-tender, U-1  Perineum: repair intact  Lochia: small  Extremities: no edema, no calf pain or tenderness    A/P: PPD # 1 29 y.o., G1P0000   Principal Problem:   Postpartum care following vaginal delivery 8/23 Active Problems:   History of pulmonary embolus (PE)  - resume Lovenox x 12 wks PP   Hx of opioid abuse (HCC)  -  In long term recovery.  Pt has been following with NA, has sponsor, has plan for pain management. Great support.   Encounter for  induction of labor   SVD (spontaneous vaginal delivery) 8/23   Maternal anemia, with delivery - IDA w/ superimposed ABL  - asymptomatic, started oral Fe and Mag ox   Mood disorder (HCC)  - h/o anxiety/ depression, on Buspar 7.5 bid and Zoloft 50mg  daily   Perineal laceration, second degree   Acute urinary retention  - DC foley cath now, will defer bladder trial since great urinary output, will encourage voids q 1-2 hors while awake, patient instructed to attempt voiding even if not feeling urge.    History of PCOS  - resume metformin 500 mg PO BID   Anticipate discharge tomorrow    9/23, MSN, CNM 12/12/2019, 10:22 AM

## 2019-12-12 NOTE — Lactation Note (Addendum)
This note was copied from a baby's chart. Lactation Consultation Note  Patient Name: Sheri [name] ACZYS'[id number] Date: 12/12/2019 Reason for consult: Initial assessment;1st time breastfeeding   P1, Baby 22 hours old.  Baby had low blood sugars after birth and was supplementing w/ formula. Mother has history of PCOS.  Encouraged her to latch before offering formula. Assisted with latching in both cross cradle and football hold. Baby latches easily, occasionally comes off and reviewed with mother how to re-latch. Parents state baby has been lethargic and not been showing feeding cues. Unwrapped baby for feeding and baby was interested in breastfeeding. Reviewed hand expression with drops. Encouraged breastfeeding before offering formula.  Feed on demand with cues.  Goal 8-12+ times per day after first 24 hrs.  Place baby STS if not cueing.  Mom made aware of O/P services, breastfeeding support groups, community resources, and our phone # for post-discharge questions.  Mother has DEBP at home.  Suggest mother post pump a few times a day to increase her supply.   Returned to room to discuss volume guidelines, encouraging mother to breastfeed before offering formula and post pumping with manual pump for 10 min for stimulation each time mother gives formula.  Mtoher has personal DEBP and will call when she is ready to pump w/ DEBP.     Maternal Data Has patient been taught Hand Expression?: Yes Does the patient have breastfeeding experience prior to this delivery?: No  Feeding Feeding Type: Breast Fed Nipple Type: Slow - flow  LATCH Score Latch: Grasps breast easily, tongue down, lips flanged, rhythmical sucking.  Audible Swallowing: A few with stimulation  Type of Nipple: Everted at rest and after stimulation  Comfort (Breast/Nipple): Soft / non-tender  Hold (Positioning): Assistance needed to correctly position infant at breast and maintain latch.  LATCH Score:  8  Interventions Interventions: Breast feeding basics reviewed;Assisted with latch;Hand express;Hand pump  Lactation Tools Discussed/Used     Consult Status Consult Status: Follow-up Date: 12/13/19 Follow-up type: In-patient    Dahlia Byes [facility] 12/12/2019, 9:22 AM

## 2019-12-13 MED ORDER — ENOXAPARIN SODIUM 60 MG/0.6ML ~~LOC~~ SOLN: 40.0000 mg | SUBCUTANEOUS | 0 refills | Status: DC

## 2019-12-13 MED ORDER — POLYSACCHARIDE IRON COMPLEX 150 MG PO CAPS: 150.0000 mg | ORAL_CAPSULE | Freq: Every day | ORAL | Status: DC

## 2019-12-13 MED ORDER — COCONUT OIL OIL: 1.0000 "application " | TOPICAL_OIL | 0 refills | Status: DC | PRN

## 2019-12-13 MED ORDER — IBUPROFEN 600 MG PO TABS
600.0000 mg | ORAL_TABLET | Freq: Four times a day (QID) | ORAL | 0 refills | Status: DC
Start: 2019-12-13 — End: 2020-01-29

## 2019-12-13 MED ORDER — BENZOCAINE-MENTHOL 20-0.5 % EX AERO: 1.0000 "application " | INHALATION_SPRAY | CUTANEOUS | Status: DC | PRN

## 2019-12-13 NOTE — Plan of Care (Signed)
Patient appropriate for discharge.

## 2019-12-13 NOTE — Lactation Note (Signed)
This note was copied from a baby's chart. Lactation Consultation Note  Patient Name: Sheri [name] CWCBJ'S Date: 12/13/2019 Reason for consult: Follow-up assessment   Baby 49 hours old.  Discussed short anterior frenulum with family and provided resource sheet. Suggest mother post pump with DEBP at home a few times a day and give volume back to baby. Reviewed engorgement care and monitoring voids/stools. Suggest OP appointment with either LC in MD office or [facility] OP.    Maternal Data    Feeding Feeding Type: Breast Fed  LATCH Score                   Interventions Interventions: Breast feeding basics reviewed;DEBP  Lactation Tools Discussed/Used Tools: 29F feeding tube / Syringe   Consult Status Consult Status: Complete Date: 12/13/19    Dahlia Byes [facility] 12/13/2019, 11:21 AM

## 2019-12-13 NOTE — Discharge Summary (Signed)
OB Discharge Summary  Patient Name: Sheri [name] DOB: 1990-08-16 MRN: [id number]  Date of admission: 12/11/2019 Delivering provider: Clance Boll A   Admitting diagnosis: Encounter for induction of labor [Z34.90] Intrauterine pregnancy: 100w3d     Secondary diagnosis: Patient Active Problem List   Diagnosis Date Noted  . Maternal anemia, with delivery - IDA w/ superimposed ABL 12/12/2019  . Mood disorder (HCC) 12/12/2019  . Perineal laceration, second degree 12/12/2019  . Acute urinary retention 12/12/2019  . History of PCOS 12/12/2019  . Encounter for induction of labor 12/11/2019  . SVD (spontaneous vaginal delivery) 8/23 12/11/2019  . Postpartum care following vaginal delivery 8/23 12/11/2019  . Nonallopathic lesion of lumbosacral region 07/11/2019  . Nonallopathic lesion of sacral region 07/11/2019  . Scapular dyskinesis 09/01/2018  . Nonallopathic lesion of thoracic region 09/01/2018  . Nonallopathic lesion of rib cage 09/01/2018  . Nonallopathic lesion of cervical region 09/01/2018  . Mitral regurgitation 06/13/2018  . Thoracic outlet syndrome of left thoracic outlet 02/15/2018  . Left shoulder pain 01/11/2018  . Pain in thoracic spine 08/16/2017  . Hx of opioid abuse (HCC) 12/30/2016  . Thoracic radiculopathy due to degenerative joint disease of spine 10/13/2016  . Orthostatic hypotension 05/18/2016  . Hypokalemia 05/17/2016  . Syncope and collapse 05/16/2016  . PCOS (polycystic ovarian syndrome) 02/13/2015  . IUD (intrauterine device) in place 02/13/2015  . History of pulmonary embolus (PE) 02/13/2015  . Bipolar depression (HCC)    Additional problems:none   Date of discharge: 12/13/2019   Discharge diagnosis: Principal Problem:   Postpartum care following vaginal delivery 8/23 Active Problems:   History of pulmonary embolus (PE)   Hx of opioid abuse (HCC)   Encounter for induction of labor   SVD (spontaneous vaginal delivery) 8/23   Maternal anemia,  with delivery - IDA w/ superimposed ABL   Mood disorder (HCC)   Perineal laceration, second degree   Acute urinary retention   History of PCOS                                                              Post partum procedures:none  Augmentation: AROM and Cytotec Pain control: Epidural  Laceration:2nd degree;Perineal  Episiotomy:None  Complications: None  Hospital course:  Induction of Labor With Vaginal Delivery   29 y.o. yo G1P0000 at [redacted]w[redacted]d was admitted to the hospital 12/11/2019 for induction of labor.  Indication for induction: hx PE on anticoagulation.  Patient had an uncomplicated labor course as follows: Membrane Rupture Time/Date: 9:48 AM ,12/11/2019   Delivery Method:Vaginal, Spontaneous  Episiotomy: None  Lacerations:  2nd degree;Perineal  Details of delivery can be found in separate delivery note.  Patient had a routine postpartum course. Patient is discharged home 12/13/19.  Newborn Data: Birth date:12/11/2019  Birth time:10:14 AM  Gender:Female  Living status:Living  Apgars:8 ,9  Weight:3195 g   Physical exam  Vitals:   12/12/19 0500 12/12/19 1500 12/12/19 2307 12/13/19 0541  BP: 107/68 109/70 119/63 123/72  Pulse: 96 96 98 92  Resp: 16 18 17 17   Temp: 98.1 F (36.7 C) 98.1 F (36.7 C) 97.6 F (36.4 C) 97.8 F (36.6 C)  TempSrc: Oral Oral Axillary Oral  SpO2: 100% 99% 100% 99%  Weight:      Height:  General: alert, cooperative and no distress Lochia: appropriate Uterine Fundus: firm Incision: N/A Perineum: repair intact, no edema DVT Evaluation: No cords or calf tenderness. No significant calf/ankle edema. Labs: Lab Results  Component Value Date   WBC 14.1 (H) 12/12/2019   HGB 9.3 (L) 12/12/2019   HCT 28.3 (L) 12/12/2019   MCV 90.1 12/12/2019   PLT 243 12/12/2019   CMP Latest Ref Rng & Units 05/23/2019  Glucose 70 - 99 mg/dL 73  BUN 6 - 23 mg/dL 8  Creatinine 2.35 - 5.73 mg/dL 2.20  Sodium 254 - 270 mEq/L 136  Potassium 3.5 - 5.1 mEq/L  4.1  Chloride 96 - 112 mEq/L 103  CO2 19 - 32 mEq/L 26  Calcium 8.4 - 10.5 mg/dL 9.5  Total Protein 6.0 - 8.3 g/dL 7.0  Total Bilirubin 0.2 - 1.2 mg/dL 0.4  Alkaline Phos 39 - 117 U/L 41  AST 0 - 37 U/L 11  ALT 0 - 35 U/L 10   Edinburgh Postnatal Depression Scale Screening Tool 12/12/2019 12/11/2019  I have been able to laugh and see the funny side of things. 0 (No Data)  I have looked forward with enjoyment to things. 0 -  I have blamed myself unnecessarily when things went wrong. 1 -  I have been anxious or worried for no good reason. 1 -  I have felt scared or panicky for no good reason. 0 -  Things have been getting on top of me. 1 -  I have been so unhappy that I have had difficulty sleeping. 0 -  I have felt sad or miserable. 1 -  I have been so unhappy that I have been crying. 0 -  The thought of harming myself has occurred to me. 0 -  Edinburgh Postnatal Depression Scale Total 4 -   Vaccines: TDaP UTD         Flu    UTD  Discharge instruction:  per After Visit Summary,  [facility] OB booklet and  "Understanding Mother & Baby Care" hospital booklet  After Visit Meds:  Allergies as of 12/13/2019      Reactions   Bupropion Other (See Comments)      Medication List    STOP taking these medications   heparin 62376 UNIT/ML injection   IRON PO   valACYclovir 500 MG tablet Commonly known as: VALTREX     TAKE these medications   acetaminophen 325 MG tablet Commonly known as: TYLENOL Take 650 mg by mouth every 6 (six) hours as needed for mild pain or headache.   benzocaine-Menthol 20-0.5 % Aero Commonly known as: DERMOPLAST Apply 1 application topically as needed for irritation (perineal discomfort).   busPIRone 7.5 MG tablet Commonly known as: BUSPAR Take 7.5 mg by mouth 2 (two) times daily.   coconut oil Oil Apply 1 application topically as needed.   enoxaparin 60 MG/0.6ML injection Commonly known as: LOVENOX Inject 0.4 mLs (40 mg total) into the skin  daily. Start taking on: December 14, 2019   guaifenesin 100 MG/5ML syrup Commonly known as: ROBITUSSIN Take 200 mg by mouth 3 (three) times daily as needed for cough.   hydrOXYzine 25 MG tablet Commonly known as: ATARAX/VISTARIL 25-50 mg Po As needed with flying q 8 hours   ibuprofen 600 MG tablet Commonly known as: ADVIL Take 1 tablet (600 mg total) by mouth every 6 (six) hours.   iron polysaccharides 150 MG capsule Commonly known as: Ferrex 150 Take 1 capsule (150 mg total) by mouth  daily.   MAGNESIUM PO Take 1 tablet by mouth daily.   metFORMIN 500 MG tablet Commonly known as: GLUCOPHAGE Take 500 mg by mouth in the morning and at bedtime.   PRENATAL PO Take 1 tablet by mouth daily.   sertraline 50 MG tablet Commonly known as: ZOLOFT Take 75 mg by mouth at bedtime.            Discharge Care Instructions  (From admission, onward)         Start     Ordered   12/13/19 0000  Discharge wound care:       Comments: Sitz baths 2 times /day with warm water x 1 week. May add herbals: 1 ounce dried comfrey leaf* 1 ounce calendula flowers 1 ounce lavender flowers  Supplies can be found online at [employer] sources at [employer], [employer]  1/2 ounce dried uva ursi leaves 1/2 ounce witch hazel blossoms (if you can find them) 1/2 ounce dried sage leaf 1/2 cup sea salt Directions: Bring 2 quarts of water to a boil. Turn off heat, and place 1 ounce (approximately 1 large handful) of the above mixed herbs (not the salt) into the pot. Steep, covered, for 30 minutes.  Strain the liquid well with a fine mesh strainer, and discard the herb material. Add 2 quarts of liquid to the tub, along with the 1/2 cup of salt. This medicinal liquid can also be made into compresses and peri-rinses.   12/13/19 1005          Diet: iron rich diet  Activity: Advance as tolerated. Pelvic rest for 6 weeks.   Postpartum contraception: Not Discussed  Newborn Data: Live  born female  Birth Weight: 7 lb 0.7 oz (3195 g) APGAR: 8, 9  Newborn Delivery   Birth date/time: 12/11/2019 10:14:00 Delivery type: Vaginal, Spontaneous      named Fabian Sharp Baby Feeding: Breast Disposition:home with mother   Delivery Report:  Review the Delivery Report for details.    Follow up:  Follow-up Information    Noland Fordyce, MD. Schedule an appointment as soon as possible for a visit in 6 week(s).   Specialty: Obstetrics and Gynecology Contact information: [address] [place] [address] [phone]                 Signed: Cipriano Mile, MSN 12/13/2019, 8:48 AM

## 2019-12-16 ENCOUNTER — Other Ambulatory Visit: Payer: Self-pay

## 2019-12-16 ENCOUNTER — Encounter (HOSPITAL_COMMUNITY): Payer: Self-pay | Admitting: Obstetrics and Gynecology

## 2019-12-16 ENCOUNTER — Inpatient Hospital Stay (HOSPITAL_COMMUNITY)
Admission: AD | Admit: 2019-12-16 | Discharge: 2019-12-16 | Disposition: A | Discharge status: Home / Self Care | Payer: BC Managed Care – PPO | Attending: Obstetrics and Gynecology | Admitting: Obstetrics and Gynecology

## 2019-12-16 DIAGNOSIS — E282 Polycystic ovarian syndrome: Secondary | ICD-10-CM | POA: Insufficient documentation

## 2019-12-16 DIAGNOSIS — Z7984 Long term (current) use of oral hypoglycemic drugs: Secondary | ICD-10-CM | POA: Insufficient documentation

## 2019-12-16 DIAGNOSIS — O99345 Other mental disorders complicating the puerperium: Secondary | ICD-10-CM | POA: Diagnosis not present

## 2019-12-16 DIAGNOSIS — O9089 Other complications of the puerperium, not elsewhere classified: Secondary | ICD-10-CM | POA: Insufficient documentation

## 2019-12-16 DIAGNOSIS — O99893 Other specified diseases and conditions complicating puerperium: Secondary | ICD-10-CM

## 2019-12-16 DIAGNOSIS — R339 Retention of urine, unspecified: Secondary | ICD-10-CM | POA: Diagnosis present

## 2019-12-16 DIAGNOSIS — F419 Anxiety disorder, unspecified: Secondary | ICD-10-CM | POA: Diagnosis not present

## 2019-12-16 DIAGNOSIS — E785 Hyperlipidemia, unspecified: Secondary | ICD-10-CM | POA: Diagnosis not present

## 2019-12-16 DIAGNOSIS — F319 Bipolar disorder, unspecified: Secondary | ICD-10-CM | POA: Insufficient documentation

## 2019-12-16 DIAGNOSIS — N3289 Other specified disorders of bladder: Secondary | ICD-10-CM

## 2019-12-16 DIAGNOSIS — Z86711 Personal history of pulmonary embolism: Secondary | ICD-10-CM | POA: Insufficient documentation

## 2019-12-16 DIAGNOSIS — Z79899 Other long term (current) drug therapy: Secondary | ICD-10-CM | POA: Diagnosis not present

## 2019-12-16 DIAGNOSIS — Z87891 Personal history of nicotine dependence: Secondary | ICD-10-CM | POA: Insufficient documentation

## 2019-12-16 DIAGNOSIS — Z7901 Long term (current) use of anticoagulants: Secondary | ICD-10-CM | POA: Diagnosis not present

## 2019-12-16 NOTE — MAU Note (Signed)
Sheri [name] is a 29 y.o. PP 5 days here in MAU reporting:  +urinary retention States that she only "dribbles" when she goes to the bathroom. Feels pressure lower mid abd.  Pain score: 8/10 Endorses that this occurred following delivery as well. But was able to void before being discharged. Vitals:   12/16/19 1058  BP: 132/77  Pulse: 95  Resp: 17  Temp: 98.1 F (36.7 C)  SpO2: 99%     Lab orders placed from triage: none

## 2019-12-16 NOTE — MAU Provider Note (Signed)
History     CSN: [id number]  Arrival date and time: 12/16/19 1039   First Provider Initiated Contact with Patient 12/16/19 1114      Chief Complaint  Patient presents with  . URINARY RETENTION   HPI Sheri [name] is a 29 y.o. G1P0000 postpartum from a vaginal delivery on 8/23 who presents with difficulty urinating. She initially reported inability to urinate but states she was able to go to the bathroom before she left the house and upon arrival to MAU. She states she feels pressure before she urinates and feels like it is only small amounts at a time.   OB History    Gravida  1   Para  0   Term  0   Preterm  0   AB  0   Living  0     SAB  0   TAB  0   Ectopic  0   Multiple  0   Live Births              Past Medical History:  Diagnosis Date  . Alcohol addiction (HCC)   . Anxiety   . Bipolar depression (HCC)    under psych rx   . Depression   . Drug addiction in remission [facility])    heroin  . Enteritis due to Norovirus 05/18/2016  . Hx of pulmonary embolus 02/13/2015  . Hyperlipidemia   . Hypokalemia 05/17/2016  . IUD (intrauterine device) in place 02/13/2015  . Mild mitral regurgitation   . Mild tricuspid regurgitation   . Mitral regurgitation 06/13/2018  . Near syncope 05/16/2016   a. felt due to norovirus.  . Nonallopathic lesion of cervical region 09/01/2018  . Nonallopathic lesion of rib cage 09/01/2018  . Obesity   . Orthostatic hypotension 05/18/2016  . PCOS (polycystic ovarian syndrome) 02/13/2015  . Pulmonary embolism (HCC) 2011   neg heme evaluation  felt to be from ocps   . Scapular dyskinesis 09/01/2018  . Smoker 12/30/2016  . Syncope and collapse 05/16/2016  . Thoracic outlet syndrome of left thoracic outlet 02/15/2018  . Thoracic radiculopathy due to degenerative joint disease of spine 10/13/2016   CT and XR show at least 3 levels of DDD of thoracic spine  I suspect this is related to both the scoliosis and the history of dance     Past Surgical History:  Procedure Laterality Date  . NO PAST SURGERIES      Family History  Problem Relation Age of Onset  . Colon cancer Maternal Grandfather   . Hypertension Father   . Irritable bowel syndrome Father     Social History   Tobacco Use  . Smoking status: Former Smoker    Quit date: 12/20/2015    Years since quitting: 3.9  . Smokeless tobacco: Never Used  Vaping Use  . Vaping Use: Never used  Substance Use Topics  . Alcohol use: No    Alcohol/week: 0.0 standard drinks  . Drug use: No    Allergies:  Allergies  Allergen Reactions  . Bupropion Other (See Comments)    Medications Prior to Admission  Medication Sig Dispense Refill Last Dose  . acetaminophen (TYLENOL) 325 MG tablet Take 650 mg by mouth every 6 (six) hours as needed for mild pain or headache.     . benzocaine-Menthol (DERMOPLAST) 20-0.5 % AERO Apply 1 application topically as needed for irritation (perineal discomfort).     . busPIRone (BUSPAR) 7.5 MG tablet Take 7.5 mg by mouth 2 (  two) times daily.     . coconut oil OIL Apply 1 application topically as needed.  0   . enoxaparin (LOVENOX) 60 MG/0.6ML injection Inject 0.4 mLs (40 mg total) into the skin daily. 36 mL 0   . guaifenesin (ROBITUSSIN) 100 MG/5ML syrup Take 200 mg by mouth 3 (three) times daily as needed for cough.     . hydrOXYzine (ATARAX/VISTARIL) 25 MG tablet 25-50 mg Po As needed with flying q 8 hours (Patient not taking: Reported on 12/12/2019) 15 tablet 0   . ibuprofen (ADVIL) 600 MG tablet Take 1 tablet (600 mg total) by mouth every 6 (six) hours. 30 tablet 0   . iron polysaccharides (FERREX 150) 150 MG capsule Take 1 capsule (150 mg total) by mouth daily.     Marland Kitchen MAGNESIUM PO Take 1 tablet by mouth daily.     . metFORMIN (GLUCOPHAGE) 500 MG tablet Take 500 mg by mouth in the morning and at bedtime.     . Prenatal Vit-Fe Fumarate-FA (PRENATAL PO) Take 1 tablet by mouth daily.     . sertraline (ZOLOFT) 50 MG tablet Take 75 mg  by mouth at bedtime.        Review of Systems  Constitutional: Negative.  Negative for fatigue and fever.  HENT: Negative.   Respiratory: Negative.  Negative for shortness of breath.   Cardiovascular: Negative.  Negative for chest pain.  Gastrointestinal: Negative.  Negative for abdominal pain, constipation, diarrhea, nausea and vomiting.  Genitourinary: Positive for difficulty urinating. Negative for dysuria.  Neurological: Negative.  Negative for dizziness and headaches.   Physical Exam   Blood pressure 132/77, pulse 95, temperature 98.1 F (36.7 C), temperature source Oral, resp. rate 17, SpO2 99 %, unknown if currently breastfeeding.  Physical Exam Vitals and nursing note reviewed.  Constitutional:      General: She is not in acute distress.    Appearance: She is well-developed.  HENT:     Head: Normocephalic.  Eyes:     Pupils: Pupils are equal, round, and reactive to light.  Cardiovascular:     Rate and Rhythm: Normal rate and regular rhythm.     Heart sounds: Normal heart sounds.  Pulmonary:     Effort: Pulmonary effort is normal. No respiratory distress.     Breath sounds: Normal breath sounds.  Abdominal:     General: Bowel sounds are normal. There is no distension.     Palpations: Abdomen is soft.     Tenderness: There is no abdominal tenderness.  Skin:    General: Skin is warm and dry.  Neurological:     Mental Status: She is alert and oriented to person, place, and time.  Psychiatric:        Behavior: Behavior normal.        Thought Content: Thought content normal.        Judgment: Judgment normal.     MAU Course  Procedures  MDM Bladder scan upon arrival to MAU- 55ml urine Patient able to urinate after bladder scan and minimal residual urine seen on repeat bladder scan.   Provided reassurance to patient that she is emptying bladder adequately despite feeling like she isn't going much. Reviewed that most cases in postpartum resolve after approximately  a week.  Offered to bladder trial patient again while in MAU, patient declined stating she feels better knowing her bladder isn't still full. Patient desires d/c and will follow up in the office this week if the pain continues.  Reviewed warning signs at length and when to return to MAU. Encouraged increasing PO hydration and frequent trips to bathroom to encourage elimination  Assessment and Plan   1. Bladder spasm   2. Postpartum state    -Discharge home in stable condition -Urinary retention precautions discussed -Patient advised to follow-up with OB as needed in the postpartum period -Patient may return to MAU as needed or if her condition were to change or worsen  Rolm Bookbinder CNM 12/16/2019, 11:14 AM

## 2019-12-29 NOTE — Progress Notes (Signed)
[name] [address] [place] [address] Phone: [phone] Subjective:   Bruce Donath, am serving as a scribe for Dr. Antoine Primas. This visit occurred during the SARS-CoV-2 public health emergency.  Safety protocols were in place, including screening questions prior to the visit, additional usage of staff PPE, and extensive cleaning of exam room while observing appropriate contact time as indicated for disinfecting solutions.   I'm seeing this patient by the request  of:  Panosh, Neta Mends, MD  CC: Back pain and neck pain follow-up  MHD:[id number]  Sheri [name] is a 29 y.o. female coming in with complaint of back and neck pain. OMT 10/19/2019. Patient states that she is doing relatively well but does have significant tightness.  Patient did have a baby recently 3 weeks ago.  Is breast-feeding.  Medications patient has been prescribed: None  Taking:         Reviewed prior external information including notes and imaging from previsou exam, outside providers and external EMR if available.   As well as notes that were available from care everywhere and other healthcare systems.  Past medical history, social, surgical and family history all reviewed in electronic medical record.  No pertanent information unless stated regarding to the chief complaint.   Past Medical History:  Diagnosis Date  . Alcohol addiction (HCC)   . Anxiety   . Bipolar depression (HCC)    under psych rx   . Depression   . Drug addiction in remission [facility])    heroin  . Enteritis due to Norovirus 05/18/2016  . Hx of pulmonary embolus 02/13/2015  . Hyperlipidemia   . Hypokalemia 05/17/2016  . IUD (intrauterine device) in place 02/13/2015  . Mild mitral regurgitation   . Mild tricuspid regurgitation   . Mitral regurgitation 06/13/2018  . Near syncope 05/16/2016   a. felt due to norovirus.  . Nonallopathic lesion of cervical region 09/01/2018  .  Nonallopathic lesion of rib cage 09/01/2018  . Obesity   . Orthostatic hypotension 05/18/2016  . PCOS (polycystic ovarian syndrome) 02/13/2015  . Pulmonary embolism (HCC) 2011   neg heme evaluation  felt to be from ocps   . Scapular dyskinesis 09/01/2018  . Smoker 12/30/2016  . Syncope and collapse 05/16/2016  . Thoracic outlet syndrome of left thoracic outlet 02/15/2018  . Thoracic radiculopathy due to degenerative joint disease of spine 10/13/2016   CT and XR show at least 3 levels of DDD of thoracic spine  I suspect this is related to both the scoliosis and the history of dance    Allergies  Allergen Reactions  . Bupropion Other (See Comments)     Review of Systems:  No headache, visual changes, nausea, vomiting, diarrhea, constipation, dizziness, abdominal pain, skin rash, fevers, chills, night sweats, weight loss, swollen lymph nodes, body aches, joint swelling, chest pain, shortness of breath, mood changes. POSITIVE muscle aches  Objective  Blood pressure 128/76, pulse (!) 105, height 5\' 3"  (1.6 m), weight 217 lb (98.4 kg), SpO2 97 %, unknown if currently breastfeeding.   General: No apparent distress alert and oriented x3 mood and affect normal, dressed appropriately.  HEENT: Pupils equal, extraocular movements intact  Respiratory: Patient's speak in full sentences and does not appear short of breath  Cardiovascular: No lower extremity edema, non tender, no erythema  Neuro: Cranial nerves II through XII are intact, neurovascularly intact in all extremities with 2+ DTRs and 2+ pulses.  Gait normal with good balance  and coordination.  MSK:  Non tender with full range of motion and good stability and symmetric strength and tone of shoulders, elbows, wrist, hip, knee and ankles bilaterally.  Back -neck exam still has some mild increase in lordosis with tenderness to palpation over the right sacroiliac joint.  Patient does have some pain in the parascapular region.  Seems to be more left  greater than right.  Near full range of motion no otherwise.  Osteopathic findings  C2 flexed rotated and side bent right C6 flexed rotated and side bent left T6 extended rotated and side bent left inhaled rib L2 flexed rotated and side bent right Sacrum right on right       Assessment and Plan:  Scapular dyskinesis Continue problem.  Discussed icing regimen and home exercises, which activities to doing which wants to avoid.  Increase activity slowly.  With patient breast-feeding we discussed ergonomics electrical be beneficial and using a pillow as well as likely different chairs that could be helpful.  Discussed positioning.  Patient will continue to avoid significant number of medications.  Follow-up with me again 4 to 6 weeks    Nonallopathic problems  Decision today to treat with OMT was based on Physical Exam  After verbal consent patient was treated with HVLA, ME, FPR techniques in cervical, rib, thoracic, lumbar, and sacral  areas  Patient tolerated the procedure well with improvement in symptoms  Patient given exercises, stretches and lifestyle modifications  See medications in patient instructions if given  Patient will follow up in 4-6 weeks      The above documentation has been reviewed and is accurate and complete Judi Saa, DO       Note: This dictation was prepared with Dragon dictation along with smaller phrase technology. Any transcriptional errors that result from this process are unintentional.

## 2020-01-02 ENCOUNTER — Ambulatory Visit (INDEPENDENT_AMBULATORY_CARE_PROVIDER_SITE_OTHER): Payer: BC Managed Care – PPO | Admitting: Family Medicine

## 2020-01-02 ENCOUNTER — Encounter: Payer: Self-pay | Admitting: Family Medicine

## 2020-01-02 ENCOUNTER — Other Ambulatory Visit: Payer: Self-pay

## 2020-01-02 VITALS — BP 128/76 | HR 105 | Ht 63.0 in | Wt 217.0 lb

## 2020-01-02 DIAGNOSIS — M999 Biomechanical lesion, unspecified: Secondary | ICD-10-CM

## 2020-01-02 DIAGNOSIS — G2589 Other specified extrapyramidal and movement disorders: Secondary | ICD-10-CM

## 2020-01-02 NOTE — Patient Instructions (Signed)
Better than anticipated See me in 4 weeks

## 2020-01-02 NOTE — Assessment & Plan Note (Signed)
Continue problem.  Discussed icing regimen and home exercises, which activities to doing which wants to avoid.  Increase activity slowly.  With patient breast-feeding we discussed ergonomics electrical be beneficial and using a pillow as well as likely different chairs that could be helpful.  Discussed positioning.  Patient will continue to avoid significant number of medications.  Follow-up with me again 4 to 6 weeks

## 2020-01-08 NOTE — Telephone Encounter (Signed)
Please also contact  Your [facility]   Since you had a c section surgery   It appears your last Hg was low . And anemia can be contributing  To fogginess and weakness  And make sure your  bp andpulse  are ok and you are hydrated    Aundra Millet can make appt for slots on Wednesday   In person or virtual if in person will not work

## 2020-01-10 NOTE — Progress Notes (Signed)
Virtual Visit via Video Note  I connected with@ on 9 23 21  at  9:00 AM EDT by a video enabled telemedicine application and verified that I am speaking with the correct person using two identifiers. Location patient: home Location provider: home office Persons participating in the virtual visit: patient, provider  [place] national recommendations  regarding COVID 19 pandemic   video visit is advised over in office visit for this patient.  Patient aware  of the limitations of evaluation and management by telemedicine and  availability of in person appointments. and agreed to proceed.   HPI: Sheri [name] presents for video visit  Had vaginal delivery  4 weeks ago  rcx for uti suspected but neg cx  Had had ongoing  persistent or progressive post prandial  Upper abd radiating between shoulder blades after eating mostly fatty  Type foods   No vomiting  But has diarreha mostly loose watery  No blood  No fever chills  Is nursing  Every one well in [facility] but baby has reflux problem  And to see doctor but not sick.  Took pepto last night  Sometimes diarrhea at night.   ROS: See pertinent positives and negatives per HPI. No fever chills    Past Medical History:  Diagnosis Date  . Alcohol addiction (HCC)   . Anxiety   . Bipolar depression (HCC)    under psych rx   . Depression   . Drug addiction in remission [facility])    heroin  . Enteritis due to Norovirus 05/18/2016  . Hx of pulmonary embolus 02/13/2015  . Hyperlipidemia   . Hypokalemia 05/17/2016  . IUD (intrauterine device) in place 02/13/2015  . Mild mitral regurgitation   . Mild tricuspid regurgitation   . Mitral regurgitation 06/13/2018  . Near syncope 05/16/2016   a. felt due to norovirus.  . Nonallopathic lesion of cervical region 09/01/2018  . Nonallopathic lesion of rib cage 09/01/2018  . Obesity   . Orthostatic hypotension 05/18/2016  . PCOS (polycystic ovarian syndrome) 02/13/2015  . Pulmonary embolism (HCC) 2011   neg heme  evaluation  felt to be from ocps   . Scapular dyskinesis 09/01/2018  . Smoker 12/30/2016  . Syncope and collapse 05/16/2016  . Thoracic outlet syndrome of left thoracic outlet 02/15/2018  . Thoracic radiculopathy due to degenerative joint disease of spine 10/13/2016   CT and XR show at least 3 levels of DDD of thoracic spine  I suspect this is related to both the scoliosis and the history of dance    Past Surgical History:  Procedure Laterality Date  . NO PAST SURGERIES      Family History  Problem Relation Age of Onset  . Colon cancer Maternal Grandfather   . Hypertension Father   . Irritable bowel syndrome Father     Social History   Tobacco Use  . Smoking status: Former Smoker    Quit date: 12/20/2015    Years since quitting: 4.0  . Smokeless tobacco: Never Used  Vaping Use  . Vaping Use: Never used  Substance Use Topics  . Alcohol use: No    Alcohol/week: 0.0 standard drinks  . Drug use: No      Current Outpatient Medications:  .  acetaminophen (TYLENOL) 325 MG tablet, Take 650 mg by mouth every 6 (six) hours as needed for mild pain or headache., Disp: , Rfl:  .  busPIRone (BUSPAR) 7.5 MG tablet, Take 7.5 mg by mouth 2 (two) times daily., Disp: , Rfl:  .  enoxaparin (LOVENOX) 60 MG/0.6ML injection, Inject 0.4 mLs (40 mg total) into the skin daily., Disp: 36 mL, Rfl: 0 .  hydrOXYzine (ATARAX/VISTARIL) 25 MG tablet, 25-50 mg Po As needed with flying q 8 hours, Disp: 15 tablet, Rfl: 0 .  metFORMIN (GLUCOPHAGE) 500 MG tablet, Take 500 mg by mouth in the morning and at bedtime., Disp: , Rfl:  .  Prenatal Vit-Fe Fumarate-FA (PRENATAL PO), Take 1 tablet by mouth daily., Disp: , Rfl:  .  sertraline (ZOLOFT) 50 MG tablet, Take 75 mg by mouth at bedtime. , Disp: , Rfl:  .  benzocaine-Menthol (DERMOPLAST) 20-0.5 % AERO, Apply 1 application topically as needed for irritation (perineal discomfort)., Disp: , Rfl:  .  coconut oil OIL, Apply 1 application topically as needed., Disp: ,  Rfl: 0 .  guaifenesin (ROBITUSSIN) 100 MG/5ML syrup, Take 200 mg by mouth 3 (three) times daily as needed for cough., Disp: , Rfl:  .  ibuprofen (ADVIL) 600 MG tablet, Take 1 tablet (600 mg total) by mouth every 6 (six) hours., Disp: 30 tablet, Rfl: 0 .  iron polysaccharides (FERREX 150) 150 MG capsule, Take 1 capsule (150 mg total) by mouth daily., Disp: , Rfl:  .  MAGNESIUM PO, Take 1 tablet by mouth daily., Disp: , Rfl:   EXAM: BP Readings from Last 3 Encounters:  01/02/20 128/76  12/16/19 (!) 126/59  12/13/19 123/72    VITALS per patient if applicable:  GENERAL: alert, oriented, appears well and in no acute distress  HEENT: atraumatic, conjunttiva clear, no obvious abnormalities on inspection of external nose and ears  NECK: normal movements of the head and neck  LUNGS: on inspection no signs of respiratory distress, breathing rate appears normal, no obvious gross SOB, gasping or wheezing  CV: no obvious cyanosis  MS: moves all visible extremities without noticeable abnormality  PSYCH/NEURO: pleasant and cooperative, no obvious depression or anxiety, speech and thought processing grossly intact Lab Results  Component Value Date   WBC 14.1 (H) 12/12/2019   HGB 9.3 (L) 12/12/2019   HCT 28.3 (L) 12/12/2019   PLT 243 12/12/2019   GLUCOSE 73 05/23/2019   CHOL 223 (H) 05/23/2019   TRIG 122.0 05/23/2019   HDL 82.90 05/23/2019   LDLDIRECT 150.0 02/13/2015   LDLCALC 116 (H) 05/23/2019   ALT 10 05/23/2019   AST 11 05/23/2019   NA 136 05/23/2019   K 4.1 05/23/2019   CL 103 05/23/2019   CREATININE 0.55 05/23/2019   BUN 8 05/23/2019   CO2 26 05/23/2019   TSH 2.45 05/23/2019   INR 1.0 12/11/2019   HGBA1C 5.4 05/23/2019    ASSESSMENT AND PLAN:  Discussed the following assessment and plan:    ICD-10-CM   1. Pain of upper abdomen radiating to back  post prandial  R10.10 US Abdomen Complete    Basic metabolic panel    CBC with Differential/Platelet    Hepatic function  panel    C-reactive protein    C. difficile GDH and Toxin A/B  2. Diarrhea, unspecified type  R19.7 US Abdomen Complete    Basic metabolic panel    CBC with Differential/Platelet    Hepatic function panel    C-reactive protein    C. difficile GDH and Toxin A/B   x 3+ weeks   4 weeks post partum Hx of antibiotic  Post prandial atypical sx  Plan [name] upper abd and biliary  Blood  testsing ( can delay if needed)   Stool for c diff testing  screen  Order   placed for [facility]  Can try famotidine   Plan fu after evaluation consider Gi eval Counseled.   Expectant management and discussion of plan and treatment with opportunity to ask questions and all were answered. The patient agreed with the plan and demonstrated an understanding of the instructions.   Advised to call back or seek an in-person evaluation if worsening  or having  further concerns . Return if symptoms worsen or fail to improve in interim.    Berniece Andreas, MD

## 2020-01-11 ENCOUNTER — Other Ambulatory Visit: Payer: Self-pay

## 2020-01-11 ENCOUNTER — Telehealth (INDEPENDENT_AMBULATORY_CARE_PROVIDER_SITE_OTHER): Payer: BC Managed Care – PPO | Admitting: Internal Medicine

## 2020-01-11 ENCOUNTER — Encounter: Payer: Self-pay | Admitting: Internal Medicine

## 2020-01-11 VITALS — Ht 63.0 in | Wt 200.0 lb

## 2020-01-11 DIAGNOSIS — R197 Diarrhea, unspecified: Secondary | ICD-10-CM | POA: Diagnosis not present

## 2020-01-11 DIAGNOSIS — R1010 Upper abdominal pain, unspecified: Secondary | ICD-10-CM | POA: Diagnosis not present

## 2020-01-18 ENCOUNTER — Ambulatory Visit
Admission: RE | Admit: 2020-01-18 | Discharge: 2020-01-18 | Disposition: A | Discharge status: Home / Self Care | Payer: BC Managed Care – PPO | Source: Ambulatory Visit | Attending: Internal Medicine | Admitting: Internal Medicine

## 2020-01-18 DIAGNOSIS — R197 Diarrhea, unspecified: Secondary | ICD-10-CM

## 2020-01-18 DIAGNOSIS — R1010 Upper abdominal pain, unspecified: Secondary | ICD-10-CM

## 2020-01-21 NOTE — Progress Notes (Signed)
Abdominal ultrasound of gall bladder is normal  no explanation of your  pain .  Reassuring  But  dosneet not totally rule out  gall bladder dysfunction ....liver may have  early fatty  infiltration(  intervention is eating healthy  and healthy weight as possible.  Magnesium can contribute to diarrhea  and not sure you are supplementing with this.  Fu after  stool tests and labs as possible  about how doing .

## 2020-01-29 ENCOUNTER — Encounter: Payer: Self-pay | Admitting: Family Medicine

## 2020-01-29 ENCOUNTER — Other Ambulatory Visit: Payer: Self-pay

## 2020-01-29 ENCOUNTER — Ambulatory Visit (INDEPENDENT_AMBULATORY_CARE_PROVIDER_SITE_OTHER): Payer: BC Managed Care – PPO | Admitting: Family Medicine

## 2020-01-29 VITALS — BP 126/84 | HR 95 | Ht 63.0 in | Wt 200.0 lb

## 2020-01-29 DIAGNOSIS — M4724 Other spondylosis with radiculopathy, thoracic region: Secondary | ICD-10-CM | POA: Diagnosis not present

## 2020-01-29 DIAGNOSIS — M999 Biomechanical lesion, unspecified: Secondary | ICD-10-CM

## 2020-01-29 DIAGNOSIS — G2589 Other specified extrapyramidal and movement disorders: Secondary | ICD-10-CM | POA: Diagnosis not present

## 2020-01-29 NOTE — Patient Instructions (Signed)
Good to see you Good luck with work tomorrow See me again in 4-6 weeks

## 2020-01-29 NOTE — Progress Notes (Signed)
Sheri [name] [address] [place] [address] Phone: [phone] Subjective:   I Sheri [name] am serving as a Neurosurgeon for Dr. Antoine [name].  This visit occurred during the SARS-CoV-2 public health emergency.  Safety protocols were in place, including screening questions prior to the visit, additional usage of staff PPE, and extensive cleaning of exam room while observing appropriate contact time as indicated for disinfecting solutions.   I'm seeing this patient by the request  of:  Panosh, Neta Mends, MD  CC: Low back pain follow-up  [id number]  Sheri [name] is a 29 y.o. female coming in with complaint of back and neck pain. OMT 01/01/2020. Patient states her body is feeling like "I carry a new born all the time).  Medications patient has been prescribed: Nothing significantly for pain except Tylenol  Taking: Intermittently         Reviewed prior external information including notes and imaging from previsou exam, outside providers and external EMR if available.   As well as notes that were available from care everywhere and other healthcare systems.  Past medical history, social, surgical and family history all reviewed in electronic medical record.  No pertanent information unless stated regarding to the chief complaint.   Past Medical History:  Diagnosis Date  . Alcohol addiction (HCC)   . Anxiety   . Bipolar depression (HCC)    under psych rx   . Depression   . Drug addiction in remission [facility])    heroin  . Enteritis due to Norovirus 05/18/2016  . Hx of pulmonary embolus 02/13/2015  . Hyperlipidemia   . Hypokalemia 05/17/2016  . IUD (intrauterine device) in place 02/13/2015  . Mild mitral regurgitation   . Mild tricuspid regurgitation   . Mitral regurgitation 06/13/2018  . Near syncope 05/16/2016   a. felt due to norovirus.  . Nonallopathic lesion of cervical region 09/01/2018  . Nonallopathic lesion of rib cage  09/01/2018  . Obesity   . Orthostatic hypotension 05/18/2016  . PCOS (polycystic ovarian syndrome) 02/13/2015  . Pulmonary embolism (HCC) 2011   neg heme evaluation  felt to be from ocps   . Scapular dyskinesis 09/01/2018  . Smoker 12/30/2016  . Syncope and collapse 05/16/2016  . Thoracic outlet syndrome of left thoracic outlet 02/15/2018  . Thoracic radiculopathy due to degenerative joint disease of spine 10/13/2016   CT and XR show at least 3 levels of DDD of thoracic spine  I suspect this is related to both the scoliosis and the history of dance    Allergies  Allergen Reactions  . Bupropion Other (See Comments)     Review of Systems:  No headache, visual changes, nausea, vomiting, diarrhea, constipation, dizziness, abdominal pain, skin rash, fevers, chills, night sweats, weight loss, swollen lymph nodes, body aches, joint swelling, chest pain, shortness of breath, mood changes. POSITIVE muscle aches  Objective  unknown if currently breastfeeding.   General: No apparent distress alert and oriented x3 mood and affect normal, dressed appropriately.  HEENT: Pupils equal, extraocular movements intact  Respiratory: Patient's speak in full sentences and does not appear short of breath  Cardiovascular: No lower extremity edema, non tender, no erythema  Neuro: Cranial nerves II through XII are intact, neurovascularly intact in all extremities with 2+ DTRs and 2+ pulses.  Gait normal with good balance and coordination.  MSK:  Non tender with full range of motion and good stability and symmetric strength and tone of shoulders, elbows, wrist, hip,  knee and ankles bilaterally.  Back -mild loss of lordosis of the lumbar spine.  Tender to palpation of the paraspinal musculature of the lumbar spine right greater than left.  Patient does have pain in the thoracolumbar junction noted.  Right greater than left as well.  Osteopathic findings  C6 flexed rotated and side bent left T3 extended rotated and  side bent right inhaled rib  L1 flexed rotated and side bent right Sacrum right on right       Assessment and Plan:   Thoracic radiculopathy due to degenerative joint disease of spine Mild worsening with radicular symptoms.  Discussed icing regimen and home exercise, discussed posture, increase activity slowly.  Follow-up with me again in 4 to 6 weeks.    Nonallopathic problems  Decision today to treat with OMT was based on Physical Exam  After verbal consent patient was treated with HVLA, ME, FPR techniques in cervical, rib, thoracic, lumbar, and sacral  areas  Patient tolerated the procedure well with improvement in symptoms  Patient given exercises, stretches and lifestyle modifications  See medications in patient instructions if given  Patient will follow up in 4-8 weeks      The above documentation has been reviewed and is accurate and complete Judi Saa, DO       Note: This dictation was prepared with Dragon dictation along with smaller phrase technology. Any transcriptional errors that result from this process are unintentional.

## 2020-01-29 NOTE — Assessment & Plan Note (Signed)
Mild worsening with radicular symptoms.  Discussed icing regimen and home exercise, discussed posture, increase activity slowly.  Follow-up with me again in 4 to 6 weeks.

## 2020-02-28 NOTE — Progress Notes (Deleted)
[name] [address] [place] [address] Phone: [phone] Subjective:    I'm seeing this patient by the request  of:  Panosh, Neta Mends, MD  CC:   [id number]  Sheri [name] is a 29 y.o. female coming in with complaint of back and neck pain. OMT 01/29/2020. Patient states   Medications patient has been prescribed: None Taking:         Reviewed prior external information including notes and imaging from previsou exam, outside providers and external EMR if available.   As well as notes that were available from care everywhere and other healthcare systems.  Past medical history, social, surgical and family history all reviewed in electronic medical record.  No pertanent information unless stated regarding to the chief complaint.   Past Medical History:  Diagnosis Date  . Alcohol addiction (HCC)   . Anxiety   . Bipolar depression (HCC)    under psych rx   . Depression   . Drug addiction in remission [facility])    heroin  . Enteritis due to Norovirus 05/18/2016  . Hx of pulmonary embolus 02/13/2015  . Hyperlipidemia   . Hypokalemia 05/17/2016  . IUD (intrauterine device) in place 02/13/2015  . Mild mitral regurgitation   . Mild tricuspid regurgitation   . Mitral regurgitation 06/13/2018  . Near syncope 05/16/2016   a. felt due to norovirus.  . Nonallopathic lesion of cervical region 09/01/2018  . Nonallopathic lesion of rib cage 09/01/2018  . Obesity   . Orthostatic hypotension 05/18/2016  . PCOS (polycystic ovarian syndrome) 02/13/2015  . Pulmonary embolism (HCC) 2011   neg heme evaluation  felt to be from ocps   . Scapular dyskinesis 09/01/2018  . Smoker 12/30/2016  . Syncope and collapse 05/16/2016  . Thoracic outlet syndrome of left thoracic outlet 02/15/2018  . Thoracic radiculopathy due to degenerative joint disease of spine 10/13/2016   CT and XR show at least 3 levels of DDD of thoracic spine  I suspect this is related  to both the scoliosis and the history of dance    Allergies  Allergen Reactions  . Bupropion Other (See Comments)     Review of Systems:  No headache, visual changes, nausea, vomiting, diarrhea, constipation, dizziness, abdominal pain, skin rash, fevers, chills, night sweats, weight loss, swollen lymph nodes, body aches, joint swelling, chest pain, shortness of breath, mood changes. POSITIVE muscle aches  Objective  unknown if currently breastfeeding.   General: No apparent distress alert and oriented x3 mood and affect normal, dressed appropriately.  HEENT: Pupils equal, extraocular movements intact  Respiratory: Patient's speak in full sentences and does not appear short of breath  Cardiovascular: No lower extremity edema, non tender, no erythema  Neuro: Cranial nerves II through XII are intact, neurovascularly intact in all extremities with 2+ DTRs and 2+ pulses.  Gait normal with good balance and coordination.  MSK:  Non tender with full range of motion and good stability and symmetric strength and tone of shoulders, elbows, wrist, hip, knee and ankles bilaterally.  Back - Normal skin, Spine with normal alignment and no deformity.  No tenderness to vertebral process palpation.  Paraspinous muscles are not tender and without spasm.   Range of motion is full at neck and lumbar sacral regions  Osteopathic findings  C2 flexed rotated and side bent right C6 flexed rotated and side bent left T3 extended rotated and side bent right inhaled rib T9 extended rotated and side bent left  L2 flexed rotated and side bent right Sacrum right on right       Assessment and Plan:    Nonallopathic problems  Decision today to treat with OMT was based on Physical Exam  After verbal consent patient was treated with HVLA, ME, FPR techniques in cervical, rib, thoracic, lumbar, and sacral  areas  Patient tolerated the procedure well with improvement in symptoms  Patient given exercises,  stretches and lifestyle modifications  See medications in patient instructions if given  Patient will follow up in 4-8 weeks      The above documentation has been reviewed and is accurate and complete Wilford Grist       Note: This dictation was prepared with Dragon dictation along with smaller phrase technology. Any transcriptional errors that result from this process are unintentional.

## 2020-03-01 ENCOUNTER — Ambulatory Visit: Payer: BC Managed Care – PPO | Admitting: Family Medicine

## 2020-03-28 ENCOUNTER — Encounter: Payer: Self-pay | Admitting: Family Medicine

## 2020-03-28 ENCOUNTER — Telehealth (INDEPENDENT_AMBULATORY_CARE_PROVIDER_SITE_OTHER): Payer: BC Managed Care – PPO | Admitting: Family Medicine

## 2020-03-28 DIAGNOSIS — R0981 Nasal congestion: Secondary | ICD-10-CM | POA: Diagnosis not present

## 2020-03-28 DIAGNOSIS — R52 Pain, unspecified: Secondary | ICD-10-CM

## 2020-03-28 DIAGNOSIS — R110 Nausea: Secondary | ICD-10-CM | POA: Diagnosis not present

## 2020-03-28 MED ORDER — ONDANSETRON HCL 4 MG PO TABS: 4.0000 mg | ORAL_TABLET | Freq: Three times a day (TID) | ORAL | 0 refills | Status: DC | PRN

## 2020-03-28 NOTE — Patient Instructions (Signed)
-  I sent the medication(s) we discussed to your pharmacy: Meds ordered this encounter  Medications  . ondansetron (ZOFRAN) 4 MG tablet    Sig: Take 1 tablet (4 mg total) by mouth every 8 (eight) hours as needed for nausea or vomiting.    Dispense:  20 tablet    Refill:  0   Sty hydrated with plenty of fluids  Follow up with your obstetrician if the breast issues do not continue to improve and resolve.  Consider a COVID test. Stay home while sick.   I hope you are feeling better soon!  Seek in person care promptly if your symptoms worsen, new concerns arise or you are not improving with treatment.  It was nice to meet you today. I help Sheri [name] out with telemedicine visits on Tuesdays and Thursdays and am available for visits on those days. If you have any concerns or questions following this visit please schedule a follow up visit with your Primary Care doctor or seek care at a local urgent care clinic to avoid delays in care.

## 2020-03-28 NOTE — Progress Notes (Signed)
Virtual Visit via Telephone Note  I connected with Sheri [name] on 03/28/20 at  3:00 PM EST by telephone and verified that I am speaking with the correct person using two identifiers.   I discussed the limitations, risks, security and privacy concerns of performing Sheri evaluation and management service by telephone and the availability of in person appointments. I also discussed with the patient that there may be a patient responsible charge related to this service. The patient expressed understanding and agreed to proceed.  Location patient: home, [place] Location provider: work or home office Participants present for the call: patient, provider Patient did not have a visit with me in the prior 7 days to address this/these issue(s).   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 I do not appreciate any SOB. Speech and thought processing are grossly intact. Patient reported vitals:  Assessment and Plan:  Nausea  Body aches  Nasal congestion  -we discussed possible serious and likely etiologies, options for evaluation and workup, limitations of telemedicine visit vs in person visit, treatment, treatment risks and precautions. Pt prefers to treat via telemedicine empirically rather than in person at this moment.   Query viral illness, influenza versus other.  Discussed options for testing for influenza and Covid, treatment options and risk.  She is currently lactating and has opted against Tamiflu, though it can be taken during lactation.  She most likely has the same illness as the family she is currently helping with, she is at their house currently and both she and several of the family members are sick in bed with similar symptoms.  She is fully vaccinated for Covid, so that is less likely, but still possible.  She agreed to pursue testing.  We also discussed the mastitis, she feels like this is improved significantly on the antibiotic, so doubt systemic symptoms from that.  However, advised if not fully resolving or worsening that she contact her OB/GYN specialist.  She initially thought the nausea was maybe from the medication, however it sounds more likely that she is having a viral illness.  She opted for oral hydration, symptomatic care and a prescription for Zofran.  Recommended low threshold for in person evaluation if worsening or not improving over the next 1 to 2 days.. Work/School slipped offered:  declined Scheduled follow up with PCP offered: Agrees to follow-up if needed Advised to seek prompt in person care if worsening, new symptoms arise, or if is not improving with treatment. Advised of options for inperson care in case PCP office not available. Did let the patient know that I only do telemedicine shifts for [place] on Tuesdays and Thursdays and advised a follow up visit with PCP or at [facility] if has further questions or concerns.   Follow Up Instructions:  I did not refer this patient for Sheri OV with me in the next 24 hours for this/these issue(s).  I discussed the assessment and treatment plan with the patient. The patient was provided Sheri opportunity to ask questions and all were answered. The patient agreed with the plan and demonstrated Sheri understanding of the instructions.   I spent 18  minutes on this encounter.   Terressa Koyanagi, DO

## 2020-03-29 ENCOUNTER — Ambulatory Visit: Payer: BC Managed Care – PPO | Admitting: Family Medicine

## 2020-04-09 ENCOUNTER — Telehealth (INDEPENDENT_AMBULATORY_CARE_PROVIDER_SITE_OTHER): Payer: BC Managed Care – PPO | Admitting: Family Medicine

## 2020-04-09 DIAGNOSIS — R062 Wheezing: Secondary | ICD-10-CM | POA: Diagnosis not present

## 2020-04-09 DIAGNOSIS — R059 Cough, unspecified: Secondary | ICD-10-CM

## 2020-04-09 MED ORDER — ALBUTEROL SULFATE HFA 108 (90 BASE) MCG/ACT IN AERS: 2.0000 | INHALATION_SPRAY | Freq: Four times a day (QID) | RESPIRATORY_TRACT | 0 refills | Status: DC | PRN

## 2020-04-09 NOTE — Patient Instructions (Signed)
-  I sent the medication(s) we discussed to your pharmacy: Meds ordered this encounter  Medications  . albuterol (PROAIR HFA) 108 (90 Base) MCG/ACT inhaler    Sig: Inhale 2 puffs into the lungs every 6 (six) hours as needed for wheezing or shortness of breath.    Dispense:  1 each    Refill:  0     I hope you are feeling better soon!  Seek in person care promptly if your symptoms worsen, new concerns arise or you are not improving with treatment.  It was nice to meet you today. I help [place] out with telemedicine visits on Tuesdays and Thursdays and am available for visits on those days. If you have any concerns or questions following this visit please schedule a follow up visit with your Primary Care doctor or seek care at a local urgent care clinic to avoid delays in care.

## 2020-04-09 NOTE — Progress Notes (Signed)
Virtual Visit via Video Note  I connected with Sheri [name]  on 04/09/20 at 10:00 AM EST by a video enabled telemedicine application and verified that I am speaking with the correct person using two identifiers.  Location patient: home, [place] Location provider:work or home office Persons participating in the virtual visit: patient, provider  I discussed the limitations of evaluation and management by telemedicine and the availability of in person appointments. The patient expressed understanding and agreed to proceed.   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  Past Medical History:  Diagnosis Date  . Alcohol addiction (HCC)   . Anxiety   . Bipolar depression (HCC)    under psych rx   . Depression   . Drug addiction in remission [facility])    heroin  . Enteritis due to Norovirus 05/18/2016  . Hx of pulmonary embolus 02/13/2015  . Hyperlipidemia   . Hypokalemia 05/17/2016  . IUD (intrauterine device) in place 02/13/2015  . Mild mitral regurgitation   . Mild tricuspid regurgitation   . Mitral regurgitation 06/13/2018  . Near syncope 05/16/2016   a. felt due to norovirus.  . Nonallopathic lesion of cervical region 09/01/2018  . Nonallopathic lesion of rib cage 09/01/2018  . Obesity   . Orthostatic  hypotension 05/18/2016  . PCOS (polycystic ovarian syndrome) 02/13/2015  . Pulmonary embolism (HCC) 2011   neg heme evaluation  felt to be from ocps   . Scapular dyskinesis 09/01/2018  . Smoker 12/30/2016  . Syncope and collapse 05/16/2016  . Thoracic outlet syndrome of left thoracic outlet 02/15/2018  . Thoracic radiculopathy due to degenerative joint disease of spine 10/13/2016   CT and XR show at least 3 levels of DDD of thoracic spine  I suspect this is related to both the scoliosis and the history of dance    Past Surgical History:  Procedure Laterality Date  . NO PAST SURGERIES       Current Outpatient Medications:  .  acetaminophen (TYLENOL) 325 MG tablet, Take 650 mg by mouth every 6 (six) hours as needed for mild pain or headache., Disp: , Rfl:  .  albuterol (PROAIR HFA) 108 (90 Base) MCG/ACT inhaler, Inhale 2 puffs into the lungs every 6 (six) hours as needed for wheezing or shortness of breath., Disp: 1 each, Rfl: 0 .  busPIRone (BUSPAR) 7.5 MG tablet, Take 7.5 mg by mouth 2 (two) times daily., Disp: , Rfl:  .  enoxaparin (LOVENOX) 60 MG/0.6ML injection, Inject 0.4 mLs (40 mg total) into the skin daily., Disp: 36 mL, Rfl: 0 .  hydrOXYzine (ATARAX/VISTARIL) 25 MG tablet, 25-50 mg Po As needed with flying q 8 hours, Disp: 15 tablet, Rfl: 0 .  metFORMIN (GLUCOPHAGE) 500 MG tablet, Take 500 mg by mouth in the morning and at bedtime., Disp: , Rfl:  .  ondansetron (ZOFRAN) 4 MG tablet, Take 1 tablet (4 mg total)  by mouth every 8 (eight) hours as needed for nausea or vomiting., Disp: 20 tablet, Rfl: 0 .  Prenatal Vit-Fe Fumarate-FA (PRENATAL PO), Take 1 tablet by mouth daily., Disp: , Rfl:  .  sertraline (ZOLOFT) 50 MG tablet, Take 75 mg by mouth at bedtime. , Disp: , Rfl:   EXAM:  VITALS per patient if applicable:  GENERAL: alert, oriented, appears well and in no acute distress  HEENT: atraumatic, conjunttiva clear, no obvious abnormalities on inspection of external nose and  ears  NECK: normal movements of the head and neck  LUNGS: on inspection no signs of respiratory distress, breathing rate appears normal, no obvious gross SOB, gasping or wheezing, no cough during visit  CV: no obvious cyanosis  MS: moves all visible extremities without noticeable abnormality  PSYCH/NEURO: pleasant and cooperative, no obvious depression or anxiety, speech and thought processing grossly intact  ASSESSMENT AND PLAN:  Discussed the following assessment and plan:  Cough  -we discussed possible serious and likely etiologies, options for evaluation and workup, limitations of telemedicine visit vs in person visit, treatment, treatment risks and precautions. Pt prefers to treat via telemedicine empirically rather than in person at this moment. Query postviral cough or bronchitis as most likely vs other. Seems less likely 2ndary bacterial illness given has improved rather than worsened, no fever, no sinus pain or mucus production. She reports home O2 98%. She opted for continuation of delsum as this is working for the cough, alb inh if needed and close follow up with PCP or  [facility] if worsening, new symptoms arise, or if is not improving over the next several days. Discussed options for inperson care if PCP office not available. Did let this patient know that I only do telemedicine on Tuesdays and Thursdays for [place]. Advised to schedule follow up visit with PCP or [facility] if any further questions or concerns to avoid delays in care.   I discussed the assessment and treatment plan with the patient. The patient was provided an opportunity to ask questions and all were answered. The patient agreed with the plan and demonstrated an understanding of the instructions.     Terressa Koyanagi, DO

## 2020-05-15 ENCOUNTER — Encounter: Payer: Self-pay | Admitting: Family Medicine

## 2020-05-15 NOTE — Progress Notes (Unsigned)
[name] [address] [place] [address] Phone: [phone] Subjective:   I Sheri [name] am serving as a Neurosurgeon for Dr. Antoine Primas.  This visit occurred during the SARS-CoV-2 public health emergency.  Safety protocols were in place, including screening questions prior to the visit, additional usage of staff PPE, and extensive cleaning of exam room while observing appropriate contact time as indicated for disinfecting solutions.   I'm seeing this patient by the request  of:  Panosh, Neta Mends, MD  CC: Back pain follow-up and new onset wrist pain  ASN:[id number]  Sheri [name] is a 30 y.o. female coming in with complaint of bilateral hand pain. Patient states she is experiencing numbness and tingling in her hands. Wrist will get sore and it will hurt to move.   Patient states that the back is giving him some discomfort as well.  Nothing as severe as when she was pregnant but still not being able to do the exercises regularly.  Having some increase in discomfort and pain.  Onset- Chronic  Location - bilateral hands  Duration-  Character- sharp  Aggravating factors- holding baby Reliving factors-  Therapies tried- ice, heat, ibuprofen  Severity- 7-8/10 at its worse      Past Medical History:  Diagnosis Date  . Alcohol addiction (HCC)   . Anxiety   . Bipolar depression (HCC)    under psych rx   . Depression   . Drug addiction in remission [facility])    heroin  . Enteritis due to Norovirus 05/18/2016  . Hx of pulmonary embolus 02/13/2015  . Hyperlipidemia   . Hypokalemia 05/17/2016  . IUD (intrauterine device) in place 02/13/2015  . Mild mitral regurgitation   . Mild tricuspid regurgitation   . Mitral regurgitation 06/13/2018  . Near syncope 05/16/2016   a. felt due to norovirus.  . Nonallopathic lesion of cervical region 09/01/2018  . Nonallopathic lesion of rib cage 09/01/2018  . Obesity   . Orthostatic hypotension 05/18/2016  .  PCOS (polycystic ovarian syndrome) 02/13/2015  . Pulmonary embolism (HCC) 2011   neg heme evaluation  felt to be from ocps   . Scapular dyskinesis 09/01/2018  . Smoker 12/30/2016  . Syncope and collapse 05/16/2016  . Thoracic outlet syndrome of left thoracic outlet 02/15/2018  . Thoracic radiculopathy due to degenerative joint disease of spine 10/13/2016   CT and XR show at least 3 levels of DDD of thoracic spine  I suspect this is related to both the scoliosis and the history of dance   Past Surgical History:  Procedure Laterality Date  . NO PAST SURGERIES     Social History   Socioeconomic History  . Marital status: Married    Spouse name: Not on file  . Number of children: Not on file  . Years of education: Not on file  . Highest education level: Not on file  Occupational History  . Occupation: nanny  Tobacco Use  . Smoking status: Former Smoker    Quit date: 12/20/2015    Years since quitting: 4.4  . Smokeless tobacco: Never Used  Vaping Use  . Vaping Use: Never used  Substance and Sexual Activity  . Alcohol use: No    Alcohol/week: 0.0 standard drinks  . Drug use: No  . Sexual activity: Not on file    Comment: previous IUD  Other Topics Concern  . 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 Bupropion Other (See Comments)   Family History  Problem Relation Age of Onset  . Colon cancer Maternal Grandfather   . Hypertension Father   . Irritable bowel syndrome Father      Current Outpatient Medications (Endocrine & Metabolic):  .  metFORMIN (GLUCOPHAGE) 500 MG tablet, Take 500 mg by mouth in the morning and at bedtime.      Current Outpatient Medications (Other):  .  busPIRone (BUSPAR) 7.5 MG tablet, Take 7.5 mg by mouth 2 (two) times daily. .  Prenatal Vit-Fe Fumarate-FA (PRENATAL PO), Take 1 tablet by mouth daily. .  sertraline (ZOLOFT) 50 MG tablet, Take 75 mg by mouth at bedtime.    Reviewed prior external information including notes and imaging from  primary care provider As well as notes that were available from care everywhere and other healthcare systems.  Past medical history, social, surgical and family history all reviewed in electronic medical record.  No pertanent information unless stated regarding to the chief complaint.   Review of Systems:  No headache, visual changes, nausea, vomiting, diarrhea, constipation, dizziness, abdominal pain, skin rash, fevers, chills, night sweats, weight loss, swollen lymph nodes, body aches, joint swelling, chest pain, shortness of breath, mood changes. POSITIVE muscle aches  Objective  Blood pressure 140/90, pulse 82, height 5\' 3"  (1.6 m), weight 205 lb (93 kg), SpO2 98 %, unknown if currently breastfeeding.   General: No apparent distress alert and oriented x3 mood and affect normal, dressed appropriately.  HEENT: Pupils equal, extraocular movements intact  Respiratory: Patient's speak in full sentences and does not appear short of breath  Cardiovascular: No lower extremity edema, non tender, no erythema  Gait normal with good balance and coordination.  MSK:  Patient bilateral hands have negative Tinel's but positive Phalen's.  Patient does have good grip strength.  No thenar eminence wasting noted.  Low back does have tightness more in the thoracolumbar junction.  Still some scapular dyskinesis with tightness of the right parascapular region.  Osteopathic findings C4 flexed rotated and side  bent left T3 extended rotated and side bent right inhaled third rib T5 extended rotated and side bent left L3 flexed rotated and side bent right Sacrum right on right  Limited musculoskeletal ultrasound was performed and interpreted by  Limited ultrasound of patient's right median nerve shows the patient does have a bifid median nerve.  There is no significant hypoechoic changes noted.   Impression: Bifid carpal tunnel right side   Impression and Recommendations:     The above documentation has been reviewed and is accurate and complete Judi Saa, DO

## 2020-05-16 ENCOUNTER — Ambulatory Visit: Payer: Self-pay

## 2020-05-16 ENCOUNTER — Other Ambulatory Visit: Payer: Self-pay

## 2020-05-16 ENCOUNTER — Encounter: Payer: Self-pay | Admitting: Family Medicine

## 2020-05-16 ENCOUNTER — Ambulatory Visit (INDEPENDENT_AMBULATORY_CARE_PROVIDER_SITE_OTHER): Payer: BC Managed Care – PPO | Admitting: Family Medicine

## 2020-05-16 VITALS — BP 140/90 | HR 82 | Ht 63.0 in | Wt 205.0 lb

## 2020-05-16 DIAGNOSIS — M79641 Pain in right hand: Secondary | ICD-10-CM

## 2020-05-16 DIAGNOSIS — M999 Biomechanical lesion, unspecified: Secondary | ICD-10-CM | POA: Diagnosis not present

## 2020-05-16 DIAGNOSIS — G5603 Carpal tunnel syndrome, bilateral upper limbs: Secondary | ICD-10-CM | POA: Diagnosis not present

## 2020-05-16 DIAGNOSIS — M79642 Pain in left hand: Secondary | ICD-10-CM | POA: Diagnosis not present

## 2020-05-16 DIAGNOSIS — G5600 Carpal tunnel syndrome, unspecified upper limb: Secondary | ICD-10-CM | POA: Insufficient documentation

## 2020-05-16 DIAGNOSIS — G2589 Other specified extrapyramidal and movement disorders: Secondary | ICD-10-CM | POA: Diagnosis not present

## 2020-05-16 NOTE — Assessment & Plan Note (Signed)

## 2020-05-16 NOTE — Assessment & Plan Note (Signed)
Signs and symptoms consistent with carpal tunnel.  Timing with patient also being nearly 6 months postpartum likely also increases the risk of this.  We discussed avoiding certain repetitive motion, bracing at night, home exercises given.  Follow-up again 6 to 8 weeks if worsening pain will consider formal physical therapy

## 2020-05-16 NOTE — Assessment & Plan Note (Signed)
More tightness of the lower back.  Patient is still working on losing weight since her pregnancy.  Patient has not had quite time for herself.  Encouraged her to continue to work on that.  Patient will increase activity slowly.  Does respond well to manipulation.  Follow-up again 6 to 8 weeks

## 2020-05-16 NOTE — Patient Instructions (Signed)
Good to see you Exercises for carpal tunnel Right wrist brace Wear braces at night Arnica lotion  See me again in 6-8 weeks

## 2020-06-05 ENCOUNTER — Telehealth (INDEPENDENT_AMBULATORY_CARE_PROVIDER_SITE_OTHER): Payer: BC Managed Care – PPO | Admitting: Family Medicine

## 2020-06-05 ENCOUNTER — Encounter: Payer: Self-pay | Admitting: Family Medicine

## 2020-06-05 VITALS — Ht 62.99 in | Wt 200.0 lb

## 2020-06-05 DIAGNOSIS — R5383 Other fatigue: Secondary | ICD-10-CM | POA: Diagnosis not present

## 2020-06-05 NOTE — Progress Notes (Signed)
Subjective:    Patient ID: Sheri [name], female    DOB: [birth year], 30 y.o.   MRN: [id number]  HPI Virtual Visit via Video Note  I connected with the patient on 06/05/20 at  1:45 PM EST by a video enabled telemedicine application and verified that I am speaking with the correct person using two identifiers.  Location patient: home Location provider:work or home office Persons participating in the virtual visit: patient, provider  I discussed the limitations of evaluation and management by telemedicine and the availability of in person appointments. The patient expressed understanding and agreed to proceed.   HPI: Here for 3 weeks of generalized fatigue and mild lightheadedness. She tends to fall asleep easily during the day. She gave birth to her baby last August and she is still breast feeding. No other symptoms. No fever or headache or ST or cough or SOB or body aches. No NVD. Her medications are the same as usual with no recent changes. She still takes a prenatal vitamin.    ROS: See pertinent positives and negatives per HPI.  Past Medical History:  Diagnosis Date  . Alcohol addiction (HCC)   . Anxiety   . Bipolar depression (HCC)    under psych rx   . Depression   . Drug addiction in remission [facility])    heroin  . Enteritis due to Norovirus 05/18/2016  . Hx of pulmonary embolus 02/13/2015  . Hyperlipidemia   . Hypokalemia 05/17/2016  . IUD (intrauterine device) in place 02/13/2015  . Mild mitral regurgitation   . Mild tricuspid regurgitation   . Mitral regurgitation 06/13/2018  . Near syncope 05/16/2016   a. felt due to norovirus.  . Nonallopathic lesion of cervical region 09/01/2018  . Nonallopathic lesion of rib cage 09/01/2018  . Obesity   . Orthostatic hypotension 05/18/2016  . PCOS (polycystic ovarian syndrome) 02/13/2015  . Pulmonary embolism (HCC) 2011   neg heme evaluation  felt to be from ocps   . Scapular dyskinesis 09/01/2018  . Smoker 12/30/2016  .  Syncope and collapse 05/16/2016  . Thoracic outlet syndrome of left thoracic outlet 02/15/2018  . Thoracic radiculopathy due to degenerative joint disease of spine 10/13/2016   CT and XR show at least 3 levels of DDD of thoracic spine  I suspect this is related to both the scoliosis and the history of dance    Past Surgical History:  Procedure Laterality Date  . NO PAST SURGERIES      Family History  Problem Relation Age of Onset  . Colon cancer Maternal Grandfather   . Hypertension Father   . Irritable bowel syndrome Father      Current Outpatient Medications:  .  busPIRone (BUSPAR) 7.5 MG tablet, Take 7.5 mg by mouth 2 (two) times daily., Disp: , Rfl:  .  metFORMIN (GLUCOPHAGE) 500 MG tablet, Take 500 mg by mouth in the morning and at bedtime., Disp: , Rfl:  .  Prenatal Vit-Fe Fumarate-FA (PRENATAL PO), Take 1 tablet by mouth daily., Disp: , Rfl:  .  sertraline (ZOLOFT) 50 MG tablet, Take 75 mg by mouth at bedtime. , Disp: , Rfl:   EXAM:  VITALS per patient if applicable:  GENERAL: alert, oriented, appears well and in no acute distress  HEENT: atraumatic, conjunttiva clear, no obvious abnormalities on inspection of external nose and ears  NECK: normal movements of the head and neck  LUNGS: on inspection no signs of respiratory distress, breathing rate appears normal, no obvious gross SOB,  gasping or wheezing  CV: no obvious cyanosis  MS: moves all visible extremities without noticeable abnormality  PSYCH/NEURO: pleasant and cooperative, no obvious depression or anxiety, speech and thought processing grossly intact  ASSESSMENT AND PLAN: Fatigue of uncertain etiology. First I asked her get tested for the Covid virus and she agreed. More likely she could be anemic or have a thyroid problem. She is scheduled for a complete exam next week with Dr. Fabian Sharp, and I advised her to have complete labs done that day to evaluate for this.  Gershon Crane, MD  Discussed the following  assessment and plan:  No diagnosis found.     I discussed the assessment and treatment plan with the patient. The patient was provided an opportunity to ask questions and all were answered. The patient agreed with the plan and demonstrated an understanding of the instructions.   The patient was advised to call back or seek an in-person evaluation if the symptoms worsen or if the condition fails to improve as anticipated.     Review of Systems     Objective:   Physical Exam        Assessment & Plan:

## 2020-06-11 NOTE — Progress Notes (Signed)
Chief Complaint  Patient presents with  . Annual Exam    HPI: Patient  Sheri [name]  30 y.o. comes in today for Preventive Health Care visit and assessment for fatigue lightheadedness for a while.  She is 6 months postpartum back to work 40 hours a week sleep 7 hours a night but interrupted for feed breasts feeding once a night Is taking prenatal vitamins but was anemic at childbirth No syncope has an IUD had 1 recent menses so far. No change in behavioral health medicines and says her depression is controlled. No history or symptoms of obstructive sleep apnea. She saw Dr. Clent Ridges on a televisit a week or so ago.  Health Maintenance  Topic Date Due  . Hepatitis C Screening  Never done  . PAP SMEAR-Modifier  05/21/2022  . TETANUS/TDAP  07/27/2027  . INFLUENZA VACCINE  Completed  . COVID-19 Vaccine  Completed  . HIV Screening  Completed   Health Maintenance Review LIFESTYLE:  Exercise:  Steps a t work  Not sedentary  Tobacco/ETS: Alcohol:  Sugar beverages: not too often  Sleep: 7  Drug use: no HH of  4  None pet  Kitty passed  Work:40 in person  67 mos old   At home  Menses ;lat week.  iud    ROS: To lose weight states that she has sweats drenching at times ever since pregnancy and still occurring. GEN/ HEENT: No fever, significant weight changesheadaches vision problems hearing changes, CV/ PULM; No chest pain shortness of breath cough, syncope,edema  change in exercise tolerance. GI /GU: No adominal pain, vomiting, change in bowel habits. No blood in the stool. No significant GU symptoms. SKIN/HEME: ,no acute skin rashes suspicious lesions or bleeding. No lymphadenopathy, nodules, masses.  NEURO/ PSYCH:  No neurologic signs such as weakness numbness. No depression anxiety. IMM/ Allergy: No unusual infections.  Allergy .   REST of 12 system review negative except as per HPI   Past Medical History:  Diagnosis Date  . Alcohol addiction (HCC)   . Anxiety   .  Bipolar depression (HCC)    under psych rx   . Depression   . Drug addiction in remission [facility])    heroin  . Enteritis due to Norovirus 05/18/2016  . Hx of pulmonary embolus 02/13/2015  . Hyperlipidemia   . Hypokalemia 05/17/2016  . IUD (intrauterine device) in place 02/13/2015  . Mild mitral regurgitation   . Mild tricuspid regurgitation   . Mitral regurgitation 06/13/2018  . Near syncope 05/16/2016   a. felt due to norovirus.  . Nonallopathic lesion of cervical region 09/01/2018  . Nonallopathic lesion of rib cage 09/01/2018  . Obesity   . Orthostatic hypotension 05/18/2016  . PCOS (polycystic ovarian syndrome) 02/13/2015  . Pulmonary embolism (HCC) 2011   neg heme evaluation  felt to be from ocps   . Scapular dyskinesis 09/01/2018  . Smoker 12/30/2016  . Syncope and collapse 05/16/2016  . Thoracic outlet syndrome of left thoracic outlet 02/15/2018  . Thoracic radiculopathy due to degenerative joint disease of spine 10/13/2016   CT and XR show at least 3 levels of DDD of thoracic spine  I suspect this is related to both the scoliosis and the history of dance    Past Surgical History:  Procedure Laterality Date  . NO PAST SURGERIES      Family History  Problem Relation Age of Onset  . Colon cancer Maternal Grandfather   . Hypertension Father   . Irritable  bowel syndrome Father     Social History   Socioeconomic History  . Marital status: Married    Spouse name: Not on file  . Number of children: Not on file  . Years of education: Not on file  . Highest education level: Not on file  Occupational History  . Occupation: nanny  Tobacco Use  . Smoking status: Former Smoker    Quit date: 12/20/2015    Years since quitting: 4.4  . Smokeless tobacco: Never Used  Vaping Use  . Vaping Use: Never used  Substance and Sexual Activity  . Alcohol use: No    Alcohol/week: 0.0 standard drinks  . Drug use: No  . Sexual activity: Not on file    Comment: previous IUD  Other Topics  Concern  . 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 busPIRone (BUSPAR) 7.5 MG tablet Take 7.5 mg by mouth 2 (two) times daily.    . metFORMIN (GLUCOPHAGE) 500 MG tablet Take 500 mg by mouth in the morning and at bedtime.    . Prenatal Vit-Fe Fumarate-FA (PRENATAL PO) Take 1 tablet by mouth daily.    . sertraline (ZOLOFT) 50 MG tablet Take 75 mg by mouth at bedtime.      No facility-administered medications prior to visit.     EXAM:  BP 128/86   Pulse 78   Temp 98.3 F (36.8 C)   Resp 16   Wt 202 lb 4.8 oz (91.8 kg)   SpO2 98%   BMI 35.84 kg/m   Body mass index is 35.84 kg/m. Wt Readings from Last 3 Encounters:  06/12/20 202 lb 4.8 oz (91.8 kg)  06/05/20 200 lb (90.7 kg)  05/16/20 205 lb (93 kg)    Physical Exam: Vital signs reviewed [id number] is a well-developed well-nourished alert cooperative    who appearsr stated age in no acute distress.  Looks tired sleep deprived type HEENT: normocephalic atraumatic , Eyes: PERRL EOM's full, conjunctiva clear, Nares: paten,t no deformity discharge or tenderness., Ears: no deformity EAC's clear TMs with normal landmarks. Mouth: Masked NECK: supple without masses, thyromegaly or bruits. CHEST/PULM:  Clear to auscultation and percussion breath sounds equal no wheeze , rales or rhonchi.  Breast: Deferred CV: PMI is nondisplaced, S1 S2 no gallops, murmurs, rubs. Peripheral pulses are full  without delay.No JVD .  ABDOMEN: Bowel sounds normal nontender  No guard or rebound, no hepato splenomegal no CVA tenderness.  Extremtities:  No clubbing cyanosis or edema, no acute joint swelling or redness no focal atrophy NEURO:  Oriented x3, cranial nerves 3-12 appear to be intact, no obvious focal weakness,gait within normal limits no abnormal reflexes or asymmetrical SKIN: No acute rashes normal turgor, color, no bruising or petechiae. PSYCH: Oriented, good eye contact, no obvious depression anxiety, cognition and judgment appear normal. LN: no  cervical axillary adenopathy  Lab Results  Component Value Date   WBC 14.1 (H) 12/12/2019   HGB 9.3 (L) 12/12/2019   HCT 28.3 (L) 12/12/2019   PLT 243 12/12/2019   GLUCOSE 73 05/23/2019   CHOL 223 (H) 05/23/2019   TRIG 122.0 05/23/2019   HDL 82.90 05/23/2019   LDLDIRECT 150.0 02/13/2015   LDLCALC 116 (H) 05/23/2019   ALT 10 05/23/2019   AST 11 05/23/2019   NA 136 05/23/2019   K 4.1 05/23/2019   CL 103 05/23/2019   CREATININE 0.55 05/23/2019   BUN 8 05/23/2019   CO2 26 05/23/2019   TSH 2.45 05/23/2019   INR 1.0 12/11/2019   HGBA1C 5.4 05/23/2019    BP Readings from Last 3 Encounters:  06/12/20 128/86  05/16/20 140/90  01/29/20 126/84    Lab results reviewed with patient   ASSESSMENT AND PLAN:  Discussed the following assessment and plan:    ICD-10-CM   1. Visit for preventive health examination  Z00.00 Basic metabolic panel    CBC with Differential/Platelet    Hemoglobin A1c    Hepatic function panel    Lipid panel    TSH    T4, free    IBC + Ferritin    IBC + Ferritin    T4, free    TSH    Lipid panel    Hepatic function panel    Hemoglobin A1c    CBC with Differential/Platelet    Basic metabolic panel    CANCELED: IBC + Ferritin  2. Other fatigue  R53.83 Basic metabolic panel    CBC with Differential/Platelet    Hemoglobin A1c    Hepatic function panel    Lipid panel    TSH    T4, free    IBC +  Ferritin    IBC + Ferritin    T4, free    TSH    Lipid panel    Hepatic function panel    Hemoglobin A1c    CBC with Differential/Platelet    Basic metabolic panel    CANCELED: IBC + Ferritin  3. Anemia, unspecified type  D64.9 Basic metabolic panel    CBC with Differential/Platelet    Hemoglobin A1c    Hepatic function panel    Lipid panel    TSH    T4, free    IBC + Ferritin    IBC + Ferritin    T4, free    TSH    Lipid panel    Hepatic function panel    Hemoglobin A1c    CBC with Differential/Platelet    Basic metabolic panel    CANCELED: IBC + Ferritin  4. Need for hepatitis C screening test  Z11.59 Hepatitis C antibody    Hepatitis C antibody  5. Lactating mother  Z39.1   Plan updated labs continue her prenatals consider adding extra iron and then go from there. Discussed continued hydration perhaps increased sleep but follow-up if progressive in someway Exam is reassuring today. Could consider medication side effect but no change in medicines.  Reported Return for depending on results.  Patient Care Team: Shalane Florendo, Neta MendsWanda K, MD as PCP - General (Internal Medicine) Lars MassonNelson, Katarina H, MD as PCP - Cardiology (Cardiology) Noland FordyceFogleman, Kelly, MD as Consulting Physician (Obstetrics and Gynecology) Lars MassonNelson, Katarina H, MD as Consulting Physician (Cardiology) Patient Instructions   Suspect cocmbo of sleep deprivation and anemia  But checking thryoid etc.      Health Maintenance, Female Adopting a healthy lifestyle and  getting preventive care are important in promoting health and wellness. Ask your health care provider about:  The right schedule for you to have regular tests and exams.  Things you can do on your own to prevent diseases and keep yourself healthy. What should I know about diet, weight, and exercise? Eat a healthy diet  Eat a diet that includes plenty of vegetables, fruits, low-fat dairy products, and lean protein.  Do not eat a lot of foods that are  high in solid fats, added sugars, or sodium.   Maintain a healthy weight Body mass index (BMI) is used to identify weight problems. It estimates body fat based on height and weight. Your health care provider can help determine your BMI and help you achieve or maintain a healthy weight. Get regular exercise Get regular exercise. This is one of the most important things you can do for your health. Most adults should:  Exercise for at least 150 minutes each week. The exercise should increase your heart rate and make you sweat (moderate-intensity exercise).  Do strengthening exercises at least twice a week. This is in addition to the moderate-intensity exercise.  Spend less time sitting. Even light physical activity can be beneficial. Watch cholesterol and blood lipids Have your blood tested for lipids and cholesterol at 31 years of age, then have this test every 5 years. Have your cholesterol levels checked more often if:  Your lipid or cholesterol levels are high.  You are older than 31 years of age.  You are at high risk for heart disease. What should I know about cancer screening? Depending on your health history and family history, you may need to have cancer screening at various ages. This may include screening for:  Breast cancer.  Cervical cancer.  Colorectal cancer.  Skin cancer.  Lung cancer. What should I know about heart disease, diabetes, and high blood pressure? Blood pressure and heart disease  High blood pressure causes heart disease and increases the risk of stroke. This is more likely to develop in people who have high blood pressure readings, are of African descent, or are overweight.  Have your blood pressure checked: ? Every 3-5 years if you are 66-78 years of age. ? Every year if you are 40 years old or older. Diabetes Have regular diabetes screenings. This checks your fasting blood sugar level. Have the screening done:  Once every three years after age 56  if you are at a normal weight and have a low risk for diabetes.  More often and at a younger age if you are overweight or have a high risk for diabetes. What should I know about preventing infection? Hepatitis B If you have a higher risk for hepatitis B, you should be screened for this virus. Talk with your health care provider to find out if you are at risk for hepatitis B infection. Hepatitis C Testing is recommended for:  Everyone born from [birth year] through 1966.  Anyone with known risk factors for hepatitis C. Sexually transmitted infections (STIs)  Get screened for STIs, including gonorrhea and chlamydia, if: ? You are sexually active and are younger than 31 years of age. ? You are older than 31 years of age and your health care provider tells you that you are at risk for this type of infection. ? Your sexual activity has changed since you were last screened, and you are at increased risk for chlamydia or gonorrhea. Ask your health care provider if you are at risk.  Ask  your health care provider about whether you are at high risk for HIV. Your health care provider may recommend a prescription medicine to help prevent HIV infection. If you choose to take medicine to prevent HIV, you should first get tested for HIV. You should then be tested every 3 months for as long as you are taking the medicine. Pregnancy  If you are about to stop having your period (premenopausal) and you may become pregnant, seek counseling before you get pregnant.  Take 400 to 800 micrograms (mcg) of folic acid every day if you become pregnant.  Ask for birth control (contraception) if you want to prevent pregnancy. Osteoporosis and menopause Osteoporosis is a disease in which the bones lose minerals and strength with aging. This can result in bone fractures. If you are 71 years old or older, or if you are at risk for osteoporosis and fractures, ask your health care provider if you should:  Be screened for bone  loss.  Take a calcium or vitamin D supplement to lower your risk of fractures.  Be given hormone replacement therapy (HRT) to treat symptoms of menopause. Follow these instructions at home: Lifestyle  Do not use any products that contain nicotine or tobacco, such as cigarettes, e-cigarettes, and chewing tobacco. If you need help quitting, ask your health care provider.  Do not use street drugs.  Do not share needles.  Ask your health care provider for help if you need support or information about quitting drugs. Alcohol use  Do not drink alcohol if: ? Your health care provider tells you not to drink. ? You are pregnant, may be pregnant, or are planning to become pregnant.  If you drink alcohol: ? Limit how much you use to 0-1 drink a day. ? Limit intake if you are breastfeeding.  Be aware of how much alcohol is in your drink. In the [place] one drink equals one 12 oz bottle of beer (355 mL), one 5 oz glass of wine (148 mL), or one 1 oz glass of hard liquor (44 mL). General instructions  Schedule regular health, dental, and eye exams.  Stay current with your vaccines.  Tell your health care provider if: ? You often feel depressed. ? You have ever been abused or do not feel safe at home. Summary  Adopting a healthy lifestyle and getting preventive care are important in promoting health and wellness.  Follow your health care provider's instructions about healthy diet, exercising, and getting tested or screened for diseases.  Follow your health care provider's instructions on monitoring your cholesterol and blood pressure. This information is not intended to replace advice given to you by your health care provider. Make sure you discuss any questions you have with your health care provider. Document Revised: 03/30/2018 Document Reviewed: 03/30/2018 Elsevier Patient Education  2021 [employer].     Manuel Garcia. Tajae Maiolo M.D.

## 2020-06-12 ENCOUNTER — Encounter: Payer: Self-pay | Admitting: Internal Medicine

## 2020-06-12 ENCOUNTER — Ambulatory Visit (INDEPENDENT_AMBULATORY_CARE_PROVIDER_SITE_OTHER): Payer: BC Managed Care – PPO | Admitting: Internal Medicine

## 2020-06-12 ENCOUNTER — Other Ambulatory Visit: Payer: Self-pay

## 2020-06-12 VITALS — BP 128/86 | HR 78 | Temp 98.3°F | Resp 16 | Wt 202.3 lb

## 2020-06-12 DIAGNOSIS — Z0000 Encounter for general adult medical examination without abnormal findings: Secondary | ICD-10-CM | POA: Diagnosis not present

## 2020-06-12 DIAGNOSIS — R5383 Other fatigue: Secondary | ICD-10-CM

## 2020-06-12 DIAGNOSIS — D649 Anemia, unspecified: Secondary | ICD-10-CM

## 2020-06-12 DIAGNOSIS — Z1159 Encounter for screening for other viral diseases: Secondary | ICD-10-CM | POA: Diagnosis not present

## 2020-06-12 LAB — CBC WITH DIFFERENTIAL/PLATELET
Basophils Absolute: 0.1 10*3/uL (ref 0.0–0.1)
Basophils Relative: 0.7 % (ref 0.0–3.0)
Eosinophils Absolute: 0.4 10*3/uL (ref 0.0–0.7)
Eosinophils Relative: 3.9 % (ref 0.0–5.0)
HCT: 37.5 % (ref 36.0–46.0)
Hemoglobin: 12.5 g/dL (ref 12.0–15.0)
Lymphocytes Relative: 24.7 % (ref 12.0–46.0)
Lymphs Abs: 2.3 10*3/uL (ref 0.7–4.0)
MCHC: 33.3 g/dL (ref 30.0–36.0)
MCV: 79.2 fl (ref 78.0–100.0)
Monocytes Absolute: 0.8 10*3/uL (ref 0.1–1.0)
Monocytes Relative: 8.1 % (ref 3.0–12.0)
Neutro Abs: 5.8 10*3/uL (ref 1.4–7.7)
Neutrophils Relative %: 62.6 % (ref 43.0–77.0)
Platelets: 386 10*3/uL (ref 150.0–400.0)
RBC: 4.73 Mil/uL (ref 3.87–5.11)
RDW: 16.7 % — ABNORMAL HIGH (ref 11.5–15.5)
WBC: 9.3 10*3/uL (ref 4.0–10.5)

## 2020-06-12 LAB — HEMOGLOBIN A1C: Hgb A1c MFr Bld: 5.8 % (ref 4.6–6.5)

## 2020-06-12 LAB — HEPATIC FUNCTION PANEL
ALT: 13 U/L (ref 0–35)
AST: 12 U/L (ref 0–37)
Albumin: 4.5 g/dL (ref 3.5–5.2)
Alkaline Phosphatase: 72 U/L (ref 39–117)
Bilirubin, Direct: 0 mg/dL (ref 0.0–0.3)
Total Bilirubin: 0.4 mg/dL (ref 0.2–1.2)
Total Protein: 7.9 g/dL (ref 6.0–8.3)

## 2020-06-12 LAB — LIPID PANEL
Cholesterol: 235 mg/dL — ABNORMAL HIGH (ref 0–200)
HDL: 66.4 mg/dL (ref >39.00)
LDL Cholesterol: 141 mg/dL — ABNORMAL HIGH (ref 0–99)
NonHDL: 169.03
Total CHOL/HDL Ratio: 4
Triglycerides: 139 mg/dL (ref 0.0–149.0)
VLDL: 27.8 mg/dL (ref 0.0–40.0)

## 2020-06-12 LAB — BASIC METABOLIC PANEL
BUN: 12 mg/dL (ref 6–23)
CO2: 27 mEq/L (ref 19–32)
Calcium: 10.9 mg/dL — ABNORMAL HIGH (ref 8.4–10.5)
Chloride: 101 mEq/L (ref 96–112)
Creatinine, Ser: 0.7 mg/dL (ref 0.40–1.20)
GFR: 115.53 mL/min (ref >60.00)
Glucose, Bld: 82 mg/dL (ref 70–99)
Potassium: 4.3 mEq/L (ref 3.5–5.1)
Sodium: 136 mEq/L (ref 135–145)

## 2020-06-12 LAB — IBC + FERRITIN
Ferritin: 12.1 ng/mL (ref 10.0–291.0)
Iron: 54 ug/dL (ref 42–145)
Saturation Ratios: 11.7 % — ABNORMAL LOW (ref 20.0–50.0)
Transferrin: 329 mg/dL (ref 212.0–360.0)

## 2020-06-12 LAB — TSH: TSH: 3.13 u[IU]/mL (ref 0.35–4.50)

## 2020-06-12 LAB — T4, FREE: Free T4: 0.8 ng/dL (ref 0.60–1.60)

## 2020-06-12 NOTE — Patient Instructions (Addendum)
Suspect cocmbo of sleep deprivation and anemia  But checking thryoid etc.      Health Maintenance, Female Adopting a healthy lifestyle and getting preventive care are important in promoting health and wellness. Ask your health care provider about:  The right schedule for you to have regular tests and exams.  Things you can do on your own to prevent diseases and keep yourself healthy. What should I know about diet, weight, and exercise? Eat a healthy diet  Eat a diet that includes plenty of vegetables, fruits, low-fat dairy products, and lean protein.  Do not eat a lot of foods that are high in solid fats, added sugars, or sodium.   Maintain a healthy weight Body mass index (BMI) is used to identify weight problems. It estimates body fat based on height and weight. Your health care provider can help determine your BMI and help you achieve or maintain a healthy weight. Get regular exercise Get regular exercise. This is one of the most important things you can do for your health. Most adults should:  Exercise for at least 150 minutes each week. The exercise should increase your heart rate and make you sweat (moderate-intensity exercise).  Do strengthening exercises at least twice a week. This is in addition to the moderate-intensity exercise.  Spend less time sitting. Even light physical activity can be beneficial. Watch cholesterol and blood lipids Have your blood tested for lipids and cholesterol at 31 years of age, then have this test every 5 years. Have your cholesterol levels checked more often if:  Your lipid or cholesterol levels are high.  You are older than 31 years of age.  You are at high risk for heart disease. What should I know about cancer screening? Depending on your health history and family history, you may need to have cancer screening at various ages. This may include screening for:  Breast cancer.  Cervical cancer.  Colorectal cancer.  Skin  cancer.  Lung cancer. What should I know about heart disease, diabetes, and high blood pressure? Blood pressure and heart disease  High blood pressure causes heart disease and increases the risk of stroke. This is more likely to develop in people who have high blood pressure readings, are of African descent, or are overweight.  Have your blood pressure checked: ? Every 3-5 years if you are 5-10 years of age. ? Every year if you are 69 years old or older. Diabetes Have regular diabetes screenings. This checks your fasting blood sugar level. Have the screening done:  Once every three years after age 78 if you are at a normal weight and have a low risk for diabetes.  More often and at a younger age if you are overweight or have a high risk for diabetes. What should I know about preventing infection? Hepatitis B If you have a higher risk for hepatitis B, you should be screened for this virus. Talk with your health care provider to find out if you are at risk for hepatitis B infection. Hepatitis C Testing is recommended for:  Everyone born from [birth year] through 1966.  Anyone with known risk factors for hepatitis C. Sexually transmitted infections (STIs)  Get screened for STIs, including gonorrhea and chlamydia, if: ? You are sexually active and are younger than 31 years of age. ? You are older than 31 years of age and your health care provider tells you that you are at risk for this type of infection. ? Your sexual activity has changed since you were  last screened, and you are at increased risk for chlamydia or gonorrhea. Ask your health care provider if you are at risk.  Ask your health care provider about whether you are at high risk for HIV. Your health care provider may recommend a prescription medicine to help prevent HIV infection. If you choose to take medicine to prevent HIV, you should first get tested for HIV. You should then be tested every 3 months for as long as you are taking  the medicine. Pregnancy  If you are about to stop having your period (premenopausal) and you may become pregnant, seek counseling before you get pregnant.  Take 400 to 800 micrograms (mcg) of folic acid every day if you become pregnant.  Ask for birth control (contraception) if you want to prevent pregnancy. Osteoporosis and menopause Osteoporosis is a disease in which the bones lose minerals and strength with aging. This can result in bone fractures. If you are 70 years old or older, or if you are at risk for osteoporosis and fractures, ask your health care provider if you should:  Be screened for bone loss.  Take a calcium or vitamin D supplement to lower your risk of fractures.  Be given hormone replacement therapy (HRT) to treat symptoms of menopause. Follow these instructions at home: Lifestyle  Do not use any products that contain nicotine or tobacco, such as cigarettes, e-cigarettes, and chewing tobacco. If you need help quitting, ask your health care provider.  Do not use street drugs.  Do not share needles.  Ask your health care provider for help if you need support or information about quitting drugs. Alcohol use  Do not drink alcohol if: ? Your health care provider tells you not to drink. ? You are pregnant, may be pregnant, or are planning to become pregnant.  If you drink alcohol: ? Limit how much you use to 0-1 drink a day. ? Limit intake if you are breastfeeding.  Be aware of how much alcohol is in your drink. In the [place] one drink equals one 12 oz bottle of beer (355 mL), one 5 oz glass of wine (148 mL), or one 1 oz glass of hard liquor (44 mL). General instructions  Schedule regular health, dental, and eye exams.  Stay current with your vaccines.  Tell your health care provider if: ? You often feel depressed. ? You have ever been abused or do not feel safe at home. Summary  Adopting a healthy lifestyle and getting preventive care are important in  promoting health and wellness.  Follow your health care provider's instructions about healthy diet, exercising, and getting tested or screened for diseases.  Follow your health care provider's instructions on monitoring your cholesterol and blood pressure. This information is not intended to replace advice given to you by your health care provider. Make sure you discuss any questions you have with your health care provider. Document Revised: 03/30/2018 Document Reviewed: 03/30/2018 Elsevier Patient Education  2021 [employer].

## 2020-06-13 LAB — HEPATITIS C ANTIBODY
Hepatitis C Ab: NONREACTIVE
SIGNAL TO CUT-OFF: 0.04 (ref <1.00)

## 2020-06-13 NOTE — Progress Notes (Signed)
Anemia is better from the last check but you are still iron deficient.  Proceed with taking an extra iron pill a day in addition to your prenatals. If you are not improving in the next 1 to 2 months we should recheck your levels.  Make a follow-up visit at that time.    Cholesterol is somewhat higher than previous

## 2020-06-15 ENCOUNTER — Telehealth (INDEPENDENT_AMBULATORY_CARE_PROVIDER_SITE_OTHER): Payer: BC Managed Care – PPO | Admitting: Family Medicine

## 2020-06-15 VITALS — HR 93 | Temp 97.8°F

## 2020-06-15 DIAGNOSIS — L299 Pruritus, unspecified: Secondary | ICD-10-CM

## 2020-06-15 MED ORDER — PREDNISONE 20 MG PO TABS: ORAL_TABLET | ORAL | 0 refills | Status: DC

## 2020-06-15 NOTE — Progress Notes (Signed)
[facility] [address] [place], [place] [address] [phone]  Date:  06/15/2020   Name:  Sheri [name]   DOB:  September 23, 1990   MRN:  [id number]  PCP:  Madelin Headings, MD    Chief Complaint: Rash   History of Present Illness:  Sheri [name] is a 30 y.o. very pleasant female patient who presents with the following:  Virtual visit today for concern of rash.  Primary patient of Dr. Karn Cassis by her last week for concern of fatigue.  Patient gave birth about 6 months ago-her daughter is healthy  Patient location is home, provider location is home.  Patient identity confirmed with 2 factors, she gives consent for virtual visit today.  The patient myself are present on the call today  Today pt notes a rash "all over her body" which began yesterday She has gotten this in the past- tends to occur around this time of year normally she will take prednisone for this rash- however, she is nursing right now and was not sure what to take She took some hydroxyzine that she had on hand-this does help with itching but this makes her really sleepy  She notes dermatographia and "welts" on her back from where tight clothing/bra was touching No angioedema or SOB  She otherwise feels well, no cough or fever, no other symptoms of illness  Patient Active Problem List   Diagnosis Date Noted  . Carpal tunnel syndrome 05/16/2020  . Maternal anemia, with delivery - IDA w/ superimposed ABL 12/12/2019  . Mood disorder (HCC) 12/12/2019  . Perineal laceration, second degree 12/12/2019  . Acute urinary retention 12/12/2019  . History of PCOS 12/12/2019  . Encounter for induction of labor 12/11/2019  . SVD (spontaneous vaginal delivery) 8/23 12/11/2019  . Postpartum care following vaginal delivery 8/23 12/11/2019  . Nonallopathic lesion of lumbosacral region 07/11/2019  . Nonallopathic lesion of sacral region 07/11/2019  .  Scapular dyskinesis 09/01/2018  . Nonallopathic lesion of thoracic region 09/01/2018  . Nonallopathic lesion of rib cage 09/01/2018  . Nonallopathic lesion of cervical region 09/01/2018  . Mitral regurgitation 06/13/2018  . Thoracic outlet syndrome of left thoracic outlet 02/15/2018  . Left shoulder pain 01/11/2018  . Pain in thoracic spine 08/16/2017  . Hx of opioid abuse (HCC) 12/30/2016  . Thoracic radiculopathy due to degenerative joint disease of spine 10/13/2016  . Orthostatic hypotension 05/18/2016  . Hypokalemia 05/17/2016  . Syncope and collapse 05/16/2016  . PCOS (polycystic ovarian syndrome) 02/13/2015  . IUD (intrauterine device) in place 02/13/2015  . History of pulmonary embolus (PE) 02/13/2015  . Bipolar depression (HCC)     Past Medical History:  Diagnosis Date  . Alcohol addiction (HCC)   . Anxiety   . Bipolar depression (HCC)    under psych rx   . Depression   . Drug addiction in remission [facility])    heroin  . Enteritis due to Norovirus 05/18/2016  . Hx of pulmonary embolus 02/13/2015  . Hyperlipidemia   . Hypokalemia 05/17/2016  . IUD (intrauterine device) in place 02/13/2015  . Mild mitral regurgitation   . Mild tricuspid regurgitation   . Mitral regurgitation 06/13/2018  . Near syncope 05/16/2016   a. felt due to norovirus.  . Nonallopathic lesion of cervical region 09/01/2018  . Nonallopathic lesion of rib cage 09/01/2018  . Obesity   . Orthostatic hypotension 05/18/2016  . PCOS (polycystic ovarian syndrome) 02/13/2015  .  Pulmonary embolism (HCC) 2011   neg heme evaluation  felt to be from ocps   . Scapular dyskinesis 09/01/2018  . Smoker 12/30/2016  . Syncope and collapse 05/16/2016  . Thoracic outlet syndrome of left thoracic outlet 02/15/2018  . Thoracic radiculopathy due to degenerative joint disease of spine 10/13/2016   CT and XR show at least 3 levels of DDD of thoracic spine  I suspect this is related to both the scoliosis and the history of dance     Past Surgical History:  Procedure Laterality Date  . NO PAST SURGERIES      Social History   Tobacco Use  . Smoking status: Former Smoker    Quit date: 12/20/2015    Years since quitting: 4.4  . Smokeless tobacco: Never Used  Vaping Use  . Vaping Use: Never used  Substance Use Topics  . Alcohol use: No    Alcohol/week: 0.0 standard drinks  . Drug use: No    Family History  Problem Relation Age of Onset  . Colon cancer Maternal Grandfather   . Hypertension Father   . Irritable bowel syndrome Father     Allergies  Allergen Reactions  . Bupropion Other (See Comments)    Medication list has been reviewed and updated.  Current Outpatient Medications on File Prior to Visit  Medication Sig Dispense Refill  . busPIRone (BUSPAR) 7.5 MG tablet Take 7.5 mg by mouth 2 (two) times daily.    . metFORMIN (GLUCOPHAGE) 500 MG tablet Take 500 mg by mouth in the morning and at bedtime.    . Prenatal Vit-Fe Fumarate-FA (PRENATAL PO) Take 1 tablet by mouth daily.    . sertraline (ZOLOFT) 50 MG tablet Take 75 mg by mouth at bedtime.      No current facility-administered medications on file prior to visit.    Review of Systems:  As per HPI- otherwise negative.   Physical Examination: Vitals:   06/15/20 0933  Pulse: 93  Temp: 97.8 F (36.6 C)   There were no vitals filed for this visit. There is no height or weight on file to calculate BMI. Ideal Body Weight:    Pt observed via video monitor. She looks well, no cough, wheezing, shortness of breath or distress is noted. She tends to show me her rash over video. It is somewhat difficult to appreciate over video, it does appear that she may have some urticaria/dermatographia She checked her HR and temp at home- all ok per pt   Assessment and Plan: Itching - Plan: predniSONE (DELTASONE) 20 MG tablet  Virtual visit today for recurrent itching and rash. Patient has noticed this periodically over the years, tends to occur early  spring. She otherwise feels well  We discussed treatment options. Patient is breast-feeding which needs to be considered. I suggested that she try over-the-counter Claritin or Zyrtec as needed for itching, this may be less sedating than hydroxyzine. We went over breast-feeding radians for these two medications-caution advised, may use short-term while breast-feeding. Watch for any infant sedation or decrease in breastmilk production  We also discussed breast refeeding rating for prednisone. This can be used in nursing, recommend separating dose from breast-feeding session by least 4 hours if possible. We decided to start her on a conservative dose, 20 mg daily for up to 7 days. She may stop this earlier if desired  I asked her to please follow-up if her rash is not improving as it typically does, or if she is any other concerns or  problems Video used for duration of visit today  Signed Abbe Amsterdam, MD

## 2020-06-17 ENCOUNTER — Encounter: Payer: Self-pay | Admitting: Family Medicine

## 2020-06-17 ENCOUNTER — Ambulatory Visit (INDEPENDENT_AMBULATORY_CARE_PROVIDER_SITE_OTHER): Payer: BC Managed Care – PPO | Admitting: Family Medicine

## 2020-06-17 ENCOUNTER — Other Ambulatory Visit: Payer: Self-pay

## 2020-06-17 VITALS — BP 110/80 | HR 80 | Temp 98.4°F | Wt 203.8 lb

## 2020-06-17 DIAGNOSIS — L509 Urticaria, unspecified: Secondary | ICD-10-CM | POA: Diagnosis not present

## 2020-06-17 NOTE — Progress Notes (Signed)
   Subjective:    Patient ID: Sheri [name], female    DOB: Nov 13, 1990, 30 y.o.   MRN: [id number]  HPI Here for 4 days of an itchy rash all over her body. She also complains of some generalized fatigue, but no fever or ST or cough or NVD. No recent changes in soaps, detergents, etc. She has had something like this several times in the past but never to this degree. She took Hydroxyzine and this helped a lot, but it is too sedating for her. of note she is currently breastfeeding her baby. She had a virtual visit over the weekend and she was prescribed 7 days of Prednisone 20 mg daily. She took this one time, and her baby developed diarrhea so she stopped it.    Review of Systems  Constitutional: Negative.   Respiratory: Negative.   Cardiovascular: Negative.   Gastrointestinal: Negative.   Skin: Positive for rash.       Objective:   Physical Exam Constitutional:      Appearance: Normal appearance. She is not ill-appearing.  Cardiovascular:     Rate and Rhythm: Normal rate and regular rhythm.     Pulses: Normal pulses.     Heart sounds: Normal heart sounds.  Pulmonary:     Effort: Pulmonary effort is normal.     Breath sounds: Normal breath sounds.  Skin:    Comments: She has patches of red slightly raised wheals over the arms, legs and trunk. There are many linear red marks from her scratching.   Neurological:     Mental Status: She is alert.           Assessment & Plan:  Hives. She will take Zyrtec 10 mg BID and Pepcid 20 mg BID until this has resolved. She may use Aveeno in the bathtub. Recheck prn. Gershon Crane, MD

## 2020-06-19 ENCOUNTER — Encounter: Payer: Self-pay | Admitting: Family Medicine

## 2020-06-19 NOTE — Telephone Encounter (Signed)
I think this will all resolve with time. It is still likely to be from Covid (with a false negative test)

## 2020-06-20 NOTE — Telephone Encounter (Signed)
Noted  

## 2020-07-02 NOTE — Progress Notes (Signed)
Sheri [name] [address] [place] [address] Phone: [phone] Subjective:   I Sheri [name] am serving as a Neurosurgeon for Dr. Antoine [name].  This visit occurred during the SARS-CoV-2 public health emergency.  Safety protocols were in place, including screening questions prior to the visit, additional usage of staff PPE, and extensive cleaning of exam room while observing appropriate contact time as indicated for disinfecting solutions.   I'm seeing this patient by the request  of:  Panosh, Neta Mends, MD  CC: Back and neck pain follow-up as well as bilateral carpal tunnel follow-up  [id number]  Sheri [name] is a 30 y.o. female coming in with complaint of back and neck pain. OMT 05/16/2020. Also f/u for B carpal tunnel. Patient states she is doing alright. Her hands are much better just flare up when she goes through her menstrual cycle. States her back is painful some days.  Nothing too severe but just seems to be more tightness.          Reviewed prior external information including notes and imaging from previsou exam, outside providers and external EMR if available.   As well as notes that were available from care everywhere and other healthcare systems.  Past medical history, social, surgical and family history all reviewed in electronic medical record.  No pertanent information unless stated regarding to the chief complaint.   Past Medical History:  Diagnosis Date  . Alcohol addiction (HCC)   . Anxiety   . Bipolar depression (HCC)    under psych rx   . Depression   . Drug addiction in remission [facility])    heroin  . Enteritis due to Norovirus 05/18/2016  . Hx of pulmonary embolus 02/13/2015  . Hyperlipidemia   . Hypokalemia 05/17/2016  . IUD (intrauterine device) in place 02/13/2015  . Mild mitral regurgitation   . Mild tricuspid regurgitation   . Mitral regurgitation 06/13/2018  . Near syncope 05/16/2016   a. felt due to  norovirus.  . Nonallopathic lesion of cervical region 09/01/2018  . Nonallopathic lesion of rib cage 09/01/2018  . Obesity   . Orthostatic hypotension 05/18/2016  . PCOS (polycystic ovarian syndrome) 02/13/2015  . Pulmonary embolism (HCC) 2011   neg heme evaluation  felt to be from ocps   . Scapular dyskinesis 09/01/2018  . Smoker 12/30/2016  . Syncope and collapse 05/16/2016  . Thoracic outlet syndrome of left thoracic outlet 02/15/2018  . Thoracic radiculopathy due to degenerative joint disease of spine 10/13/2016   CT and XR show at least 3 levels of DDD of thoracic spine  I suspect this is related to both the scoliosis and the history of dance    Allergies  Allergen Reactions  . Bupropion Other (See Comments)     Review of Systems:  No headache, visual changes, nausea, vomiting, diarrhea, constipation, dizziness, abdominal pain, skin rash, fevers, chills, night sweats, weight loss, swollen lymph nodes, body aches, joint swelling, chest pain, shortness of breath, mood changes. POSITIVE muscle aches  Objective  Blood pressure 120/70, pulse 76, height 5' 2.4" (1.585 m), weight 203 lb (92.1 kg), SpO2 99 %, unknown if currently breastfeeding.   General: No apparent distress alert and oriented x3 mood and affect normal, dressed appropriately.  Overweight HEENT: Pupils equal, extraocular movements intact  Respiratory: Patient's speak in full sentences and does not appear short of breath  Cardiovascular: No lower extremity edema, non tender, no erythema  Gait normal with good balance and coordination.  MSK:  Non tender with full range of motion and good stability and symmetric strength and tone of shoulders, elbows, wrist, hip, knee and ankles bilaterally.  Back -low back exam still has significant tightness noted in the thoracolumbar juncture.  Tightness also noted in the parascapular region with some mild scapular dyskinesis right greater than left.  Osteopathic findings   C5 flexed  rotated and side bent left T3 extended rotated and side bent right inhaled rib L3 flexed rotated and side bent right Sacrum right on right       Assessment and Plan:  Pain in thoracic spine Mild increase in the thoracic spine.  Discussed potential rotation.  Patient is breast-feeding and caring for her younger child.  Likely contributing to some of the discomfort and pain.  Discussed with patient about posture and ergonomics.  Encourage patient continue to stay active.  Patient will monitor the weight.  Follow-up with me again in 6 to 8 weeks    Nonallopathic problems  Decision today to treat with OMT was based on Physical Exam  After verbal consent patient was treated with HVLA, ME, FPR techniques in cervical, rib, thoracic, lumbar, and sacral  areas  Patient tolerated the procedure well with improvement in symptoms  Patient given exercises, stretches and lifestyle modifications  See medications in patient instructions if given  Patient will follow up in 4-8 weeks      The above documentation has been reviewed and is accurate and complete Sheri Saa, DO       Note: This dictation was prepared with Dragon dictation along with smaller phrase technology. Any transcriptional errors that result from this process are unintentional.

## 2020-07-03 ENCOUNTER — Ambulatory Visit (INDEPENDENT_AMBULATORY_CARE_PROVIDER_SITE_OTHER): Payer: BC Managed Care – PPO | Admitting: Family Medicine

## 2020-07-03 ENCOUNTER — Ambulatory Visit: Payer: Self-pay

## 2020-07-03 ENCOUNTER — Other Ambulatory Visit: Payer: Self-pay

## 2020-07-03 ENCOUNTER — Encounter: Payer: Self-pay | Admitting: Family Medicine

## 2020-07-03 VITALS — BP 120/70 | HR 76 | Ht 62.4 in | Wt 203.0 lb

## 2020-07-03 DIAGNOSIS — M79641 Pain in right hand: Secondary | ICD-10-CM

## 2020-07-03 DIAGNOSIS — M999 Biomechanical lesion, unspecified: Secondary | ICD-10-CM | POA: Diagnosis not present

## 2020-07-03 DIAGNOSIS — M546 Pain in thoracic spine: Secondary | ICD-10-CM

## 2020-07-03 DIAGNOSIS — G5603 Carpal tunnel syndrome, bilateral upper limbs: Secondary | ICD-10-CM

## 2020-07-03 DIAGNOSIS — M79642 Pain in left hand: Secondary | ICD-10-CM

## 2020-07-03 NOTE — Patient Instructions (Addendum)
Good to see you Try to work a little more rotation in the mid back Shoulders down and back See me again in 6-8 weeks

## 2020-07-03 NOTE — Assessment & Plan Note (Signed)
Mild increase in the thoracic spine.  Discussed potential rotation.  Patient is breast-feeding and caring for her younger child.  Likely contributing to some of the discomfort and pain.  Discussed with patient about posture and ergonomics.  Encourage patient continue to stay active.  Patient will monitor the weight.  Follow-up with me again in 6 to 8 weeks

## 2020-07-03 NOTE — Assessment & Plan Note (Signed)
Patient is only having symptoms more on her menstruation.  We discussed likely more secondary to fluid retention at this time.  We discussed with patient about continuing to attempt to lose weight and continue the exercises.  Worsening pain seen again and we can consider possibly formal physical therapy or injection.

## 2020-07-08 NOTE — Progress Notes (Signed)
Cardiology Office Note    Date:  07/10/2020   ID:  Sheri [name], DOB 07-Jan-1991, MRN [id number]   PCP:  Sheri Headings, MD   [facility]  Cardiologist:  Sheri Alexander, MD  Advanced Practice Provider:  No care team member to display Electrophysiologist:  None   [phone]   No chief complaint on file.   History of Present Illness:  Sheri [name] is a 30 y.o. female with history of pulmonary embolus in 2011 associated with oral contraceptives, history of syncope in 2018 in the setting of norovirus felt secondary to dehydration and hypotension. CT angiogram 05/16/2016- for PE, 2D echo at that time normal LVEF 60 to 65% with mild MR/TR. Also has history of bipolar disorder, mild HLD, depression, prior heroin addiction.   I last saw the patient 07/11/2019 at which time she was [redacted] weeks pregnant and aspirin was stopped and she was on Lovenox. Had a successful delivery 12/13/19.  Patient comes in for f/u. Baby is 64 months old. Didn't have palpitations during pregnancy but having more since-about once or twice every 2 weeks lasting 30 secs. Drinks 2 cups  Coffee/tea daily.Walking  20-30 min daily. Works as a Social worker. Labs reviewed from PCP last month and LDL high 141 but just had a baby and breast feeding. Mildly anemic on iron.   Past Medical History:  Diagnosis Date  . Alcohol addiction (HCC)   . Anxiety   . Bipolar depression (HCC)    under psych rx   . Depression   . Drug addiction in remission [facility])    heroin  . Enteritis due to Norovirus 05/18/2016  . Hx of pulmonary embolus 02/13/2015  . Hyperlipidemia   . Hypokalemia 05/17/2016  . IUD (intrauterine device) in place 02/13/2015  . Mild mitral regurgitation   . Mild tricuspid regurgitation   . Mitral regurgitation 06/13/2018  . Near syncope 05/16/2016   a. felt due to norovirus.  . Nonallopathic lesion of cervical region 09/01/2018  . Nonallopathic lesion of rib cage 09/01/2018  . Obesity    . Orthostatic hypotension 05/18/2016  . PCOS (polycystic ovarian syndrome) 02/13/2015  . Pulmonary embolism (HCC) 2011   neg heme evaluation  felt to be from ocps   . Scapular dyskinesis 09/01/2018  . Smoker 12/30/2016  . Syncope and collapse 05/16/2016  . Thoracic outlet syndrome of left thoracic outlet 02/15/2018  . Thoracic radiculopathy due to degenerative joint disease of spine 10/13/2016   CT and XR show at least 3 levels of DDD of thoracic spine  I suspect this is related to both the scoliosis and the history of dance    Past Surgical History:  Procedure Laterality Date  . NO PAST SURGERIES      Current Medications: Current Meds  Medication Sig  . aspirin EC 81 MG tablet Take 81 mg by mouth daily. Swallow whole.  . busPIRone (BUSPAR) 7.5 MG tablet Take 7.5 mg by mouth 2 (two) times daily.  . ferrous sulfate 325 (65 FE) MG tablet Take 325 mg by mouth daily with breakfast.  . metFORMIN (GLUCOPHAGE) 500 MG tablet Take 500 mg by mouth in the morning and at bedtime.  . Prenatal Vit-Fe Fumarate-FA (PRENATAL PO) Take 1 tablet by mouth daily.  . sertraline (ZOLOFT) 50 MG tablet Take 75 mg by mouth at bedtime.      Allergies:   Bupropion   Social History   Socioeconomic History  . Marital status: Married  Spouse name: Not on file  . Number of children: Not on file  . Years of education: Not on file  . Highest education level: Not on file  Occupational History  . Occupation: nanny  Tobacco Use  . Smoking status: Former Smoker    Quit date: 12/20/2015    Years since quitting: 4.5  . Smokeless tobacco: Never Used  Vaping Use  . Vaping Use: Never used  Substance and Sexual Activity  . Alcohol use: No    Alcohol/week: 0.0 standard drinks  . Drug use: No  . Sexual activity: Not on file    Comment: previous IUD  Other Topics Concern  . 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   ROS:   Please see the history of present illness.    ROS All other systems reviewed and are negative.   PHYSICAL EXAM:   VS:  BP 112/82   Pulse 75   Ht 5' 2.4" (1.585 m)   Wt 204 lb (92.5 kg)   SpO2 97%   BMI 36.84 kg/m   Physical Exam  GEN: Well nourished, well developed, in no acute distress  Neck: no JVD, carotid bruits, or masses Cardiac:RRR; no murmurs, rubs, or gallops  Respiratory:  clear to auscultation bilaterally, normal work of breathing GI: soft, nontender, nondistended, + BS Ext: without cyanosis, clubbing, or edema, Good distal pulses bilaterally Neuro:  Alert and Oriented x 3 Psych: euthymic mood, full affect  Wt Readings from Last 3 Encounters:  07/10/20 204 lb (92.5 kg)  07/03/20 203 lb (92.1 kg)  06/17/20 203 lb 12.8 oz (92.4 kg)      Studies/Labs Reviewed:   EKG:  EKG is ordered today.  The ekg ordered today demonstrates NSR, normal EKG  Recent Labs: 06/12/2020: ALT 13; BUN 12; Creatinine, Ser 0.70; Hemoglobin 12.5; Platelets 386.0; Potassium 4.3; Sodium 136; TSH 3.13   Lipid Panel    Component Value Date/Time   CHOL 235 (H) 06/12/2020 1045   TRIG 139.0 06/12/2020 1045   HDL 66.40 06/12/2020 1045   CHOLHDL 4 06/12/2020 1045   VLDL 27.8 06/12/2020 1045   LDLCALC 141 (H) 06/12/2020 1045   LDLDIRECT 150.0 02/13/2015 1449    Additional studies/ records that were reviewed today include:    Echo  09/19/19 IMPRESSIONS     1. Left ventricular ejection fraction, by estimation, is 65 to 70%. The  left ventricle has normal function. The left ventricle has no regional  wall motion abnormalities. Left ventricular diastolic parameters were  normal.   2. Right ventricular systolic function is normal. The right ventricular  size is normal. There is normal pulmonary artery systolic pressure.   3. The mitral valve is normal in structure. Trivial mitral valve  regurgitation.  No evidence of mitral stenosis.   4. The aortic valve is normal in structure. Aortic valve regurgitation is  not visualized. No aortic stenosis is present.   5. The inferior vena cava is normal in size with greater than 50%  respiratory variability, suggesting right atrial pressure of 3 mmHg.     2D echo 04/2016 Study Conclusions   - Left ventricle: The cavity size was normal. Wall thickness was   increased in a pattern of mild LVH. Systolic function was normal.   The estimated ejection fraction was in the range of 60% to 65%.   Wall motion was normal; there were no regional wall motion   abnormalities. Left ventricular diastolic function parameters   were normal. - Mitral valve: There was mild regurgitation. - Atrial septum: No defect or patent foramen ovale was identified. - Tricuspid valve: There was mild regurgitation. - Pulmonary arteries: PA peak pressure: 36 mm Hg (S).          Risk Assessment/Calculations:         ASSESSMENT:    1. History of pulmonary embolus (PE)   2. History of syncope   3. Mitral valve insufficiency, unspecified etiology   4. Palpitations      PLAN:  In order of problems listed above:  History of pulmonary embolus in 2011 felt secondary to oral contraceptives. Now on ASA   History of syncope felt secondary to dehydration from norovirus 2018 no recurrence   Mild MR on echo in 2018 trivial on echo 09/19/19   Palpitations self-limiting- 30 secs about every 2 weeks.    HLD LDL 141 but recent pregnancy and breast feeding. She is getting back on track with diet and exercise.  Fatigue mostly likely from anemia and lack of sleep with 42 month old   Shared Decision Making/Informed Consent        Medication Adjustments/Labs and Tests Ordered: Current medicines are reviewed at length with the patient today.  Concerns regarding medicines are outlined above.  Medication changes, Labs and Tests ordered today are listed in the Patient Instructions below. There are no Patient Instructions on file for this visit.   Elson Clan, PA-C  07/10/2020 8:59 AM    [facility] [address] [place], [place], [place]  [address] Phone: [phone]; Fax: [phone]

## 2020-07-10 ENCOUNTER — Encounter: Payer: Self-pay | Admitting: Physician Assistant

## 2020-07-10 ENCOUNTER — Ambulatory Visit (INDEPENDENT_AMBULATORY_CARE_PROVIDER_SITE_OTHER): Payer: BC Managed Care – PPO | Admitting: Physician Assistant

## 2020-07-10 ENCOUNTER — Other Ambulatory Visit: Payer: Self-pay

## 2020-07-10 VITALS — BP 112/82 | HR 75 | Ht 62.4 in | Wt 204.0 lb

## 2020-07-10 DIAGNOSIS — Z86711 Personal history of pulmonary embolism: Secondary | ICD-10-CM

## 2020-07-10 DIAGNOSIS — R002 Palpitations: Secondary | ICD-10-CM | POA: Diagnosis not present

## 2020-07-10 DIAGNOSIS — I340 Nonrheumatic mitral (valve) insufficiency: Secondary | ICD-10-CM

## 2020-07-10 DIAGNOSIS — Z87898 Personal history of other specified conditions: Secondary | ICD-10-CM | POA: Diagnosis not present

## 2020-07-10 DIAGNOSIS — E782 Mixed hyperlipidemia: Secondary | ICD-10-CM

## 2020-07-10 DIAGNOSIS — R5383 Other fatigue: Secondary | ICD-10-CM

## 2020-07-10 NOTE — Patient Instructions (Signed)
Medication Instructions:  Your physician recommends that you continue on your current medications as directed. Please refer to the Current Medication list given to you today.  *If you need a refill on your cardiac medications before your next appointment, please call your pharmacy*   Lab Work: None today If you have labs (blood work) drawn today and your tests are completely normal, you will receive your results only by: Marland Kitchen MyChart Message (if you have MyChart) OR . A paper copy in the mail If you have any lab test that is abnormal or we need to change your treatment, we will call you to review the results.   Follow-Up: At [facility], you and your health needs are our priority.  As part of our continuing mission to provide you with exceptional heart care, we have created designated Provider Care Teams.  These Care Teams include your primary Cardiologist (physician) and Advanced Practice Providers (APPs -  Physician Assistants and Nurse Practitioners) who all work together to provide you with the care you need, when you need it.  Your next appointment:   1 year(s)  The format for your next appointment:   In Person  Provider:   Laurance Flatten, MD

## 2020-08-09 NOTE — Progress Notes (Signed)
Sheri [name] [address] [place] [address] Phone: [phone] Subjective:   Sheri [name], am serving as a scribe for Dr. Antoine Primas. This visit occurred during the SARS-CoV-2 public health emergency.  Safety protocols were in place, including screening questions prior to the visit, additional usage of staff PPE, and extensive cleaning of exam room while observing appropriate contact time as indicated for disinfecting solutions.   I'm seeing this patient by the request  of:  Panosh, Neta Mends, MD  CC: Low back, upper back pain  [facility]:[id number]  Sheri [name] is a 30 y.o. female coming in with complaint of back and neck pain. Also follow up for carpal tunnel. OMT 07/03/2020. Patient states that she wears a brace during menstruation due to wrist pain.  Still not taking the iron quite regularly.   Medications patient has been prescribed: None          Reviewed prior external information including notes and imaging from previsou exam, outside providers and external EMR if available.   As well as notes that were available from care everywhere and other healthcare systems.  Past medical history, social, surgical and family history all reviewed in electronic medical record.  No pertanent information unless stated regarding to the chief complaint.   Past Medical History:  Diagnosis Date  . Alcohol addiction (HCC)   . Anxiety   . Bipolar depression (HCC)    under psych rx   . Depression   . Drug addiction in remission [facility])    heroin  . Enteritis due to Norovirus 05/18/2016  . Hx of pulmonary embolus 02/13/2015  . Hyperlipidemia   . Hypokalemia 05/17/2016  . IUD (intrauterine device) in place 02/13/2015  . Mild mitral regurgitation   . Mild tricuspid regurgitation   . Mitral regurgitation 06/13/2018  . Near syncope 05/16/2016   a. felt due to norovirus.  . Nonallopathic lesion of cervical region 09/01/2018  . Nonallopathic lesion of  rib cage 09/01/2018  . Obesity   . Orthostatic hypotension 05/18/2016  . PCOS (polycystic ovarian syndrome) 02/13/2015  . Pulmonary embolism (HCC) 2011   neg heme evaluation  felt to be from ocps   . Scapular dyskinesis 09/01/2018  . Smoker 12/30/2016  . Syncope and collapse 05/16/2016  . Thoracic outlet syndrome of left thoracic outlet 02/15/2018  . Thoracic radiculopathy due to degenerative joint disease of spine 10/13/2016   CT and XR show at least 3 levels of DDD of thoracic spine  I suspect this is related to both the scoliosis and the history of dance    Allergies  Allergen Reactions  . Bupropion Other (See Comments)     Review of Systems:  No headache, visual changes, nausea, vomiting, diarrhea, constipation, dizziness, abdominal pain, skin rash, fevers, chills, night sweats, weight loss, swollen lymph nodes, joint swelling, chest pain, shortness of breath, mood changes. POSITIVE muscle aches, body aches  Objective  Blood pressure 106/76, pulse 82, height 5\' 2"  (1.575 m), weight 205 lb (93 kg), SpO2 97 %, unknown if currently breastfeeding.   General: No apparent distress alert and oriented x3 mood and affect normal, dressed appropriately.  HEENT: Pupils equal, extraocular movements intact  Respiratory: Patient's speak in full sentences and does not appear short of breath  Cardiovascular: No lower extremity edema, non tender, no erythema  Gait normal with good balance and coordination.  MSK:  Non tender with full range of motion and good stability and symmetric strength and tone of  shoulders, elbows, wrist, hip, knee and ankles bilaterally.  Back -low back exam does have tightness more in the thoracolumbar juncture.  Patient noted does also have tightness around the left L4 area.  This is new for patient.  Patient also does have tightness in the right parascapular region.  Osteopathic findings  C2 flexed rotated and side bent right C3 flexed rotated and side bent left C6 flexed  rotated and side bent left T3 extended rotated and side bent right inhaled rib L1 flexed rotated and side bent right L4 flexed rotated and side bent left Sacrum right on right       Assessment and Plan:  Scapular dyskinesis Chronic problem with mild exacerbation.  Patient is having more difficulty recently.  Patient states that it is more secondary to her not having time for herself. Is taken care of.  Infantile.  Discussed with patient about posture and ergonomics when possible.  Encouraged her to continue to keep weight.  Encouraged more lower impact exercises that could also be beneficial.  Follow-up again in 5 to 6 weeks    Nonallopathic problems  Decision today to treat with OMT was based on Physical Exam  After verbal consent patient was treated with HVLA, ME, FPR techniques in cervical, rib, thoracic, lumbar, and sacral  areas  Patient tolerated the procedure well with improvement in symptoms  Patient given exercises, stretches and lifestyle modifications  See medications in patient instructions if given  Patient will follow up in 4-8 weeks      The above documentation has been reviewed and is accurate and complete Sheri Saa, DO       Note: This dictation was prepared with Dragon dictation along with smaller phrase technology. Any transcriptional errors that result from this process are unintentional.

## 2020-08-12 ENCOUNTER — Other Ambulatory Visit: Payer: Self-pay

## 2020-08-12 ENCOUNTER — Ambulatory Visit (INDEPENDENT_AMBULATORY_CARE_PROVIDER_SITE_OTHER): Payer: BC Managed Care – PPO | Admitting: Family Medicine

## 2020-08-12 ENCOUNTER — Encounter: Payer: Self-pay | Admitting: Family Medicine

## 2020-08-12 VITALS — BP 106/76 | HR 82 | Ht 62.0 in | Wt 205.0 lb

## 2020-08-12 DIAGNOSIS — M9902 Segmental and somatic dysfunction of thoracic region: Secondary | ICD-10-CM

## 2020-08-12 DIAGNOSIS — M9908 Segmental and somatic dysfunction of rib cage: Secondary | ICD-10-CM

## 2020-08-12 DIAGNOSIS — G2589 Other specified extrapyramidal and movement disorders: Secondary | ICD-10-CM

## 2020-08-12 DIAGNOSIS — M9901 Segmental and somatic dysfunction of cervical region: Secondary | ICD-10-CM

## 2020-08-12 DIAGNOSIS — M9903 Segmental and somatic dysfunction of lumbar region: Secondary | ICD-10-CM | POA: Diagnosis not present

## 2020-08-12 DIAGNOSIS — M9904 Segmental and somatic dysfunction of sacral region: Secondary | ICD-10-CM | POA: Diagnosis not present

## 2020-08-12 NOTE — Patient Instructions (Signed)
Improvement will be slow and sweet Find time for yourself See me in 5-6 weeks

## 2020-08-12 NOTE — Assessment & Plan Note (Signed)
Chronic problem with mild exacerbation.  Patient is having more difficulty recently.  Patient states that it is more secondary to her not having time for herself. Is taken care of.  Infantile.  Discussed with patient about posture and ergonomics when possible.  Encouraged her to continue to keep weight.  Encouraged more lower impact exercises that could also be beneficial.  Follow-up again in 5 to 6 weeks

## 2020-08-16 NOTE — Telephone Encounter (Signed)
Noted  

## 2020-08-17 ENCOUNTER — Encounter: Payer: Self-pay | Admitting: Family Medicine

## 2020-08-17 ENCOUNTER — Telehealth (INDEPENDENT_AMBULATORY_CARE_PROVIDER_SITE_OTHER): Payer: BC Managed Care – PPO | Admitting: Family Medicine

## 2020-08-17 DIAGNOSIS — J019 Acute sinusitis, unspecified: Secondary | ICD-10-CM | POA: Insufficient documentation

## 2020-08-17 DIAGNOSIS — J0100 Acute maxillary sinusitis, unspecified: Secondary | ICD-10-CM

## 2020-08-17 MED ORDER — AMOXICILLIN 875 MG PO TABS: 875.0000 mg | ORAL_TABLET | Freq: Two times a day (BID) | ORAL | 0 refills | Status: DC

## 2020-08-17 NOTE — Assessment & Plan Note (Signed)
Treat as bacterial sinusitis given duration and progression of symptoms. Rx amoxicillin 7d course, discussed if feeling better after 5 days may stop regimen early. Reviewed amoxicillin can enter breast milk and to be on the lookout for self limited diarrhea, GI upset or thrush in child. Reviewed further supportive care including regular nasal saline irrigation, fluids, rest - rec limit afrin, will not start topical INS given ?presence in breast milk. Update if not improving with treatment.

## 2020-08-17 NOTE — Progress Notes (Signed)
Patient ID: Sheri [name], female    DOB: 05-01-1990, 30 y.o.   MRN: [id number]  Virtual visit completed through MyChart, a video enabled telemedicine application. Due to national recommendations of social distancing due to COVID-19, a virtual visit is felt to be most appropriate for this patient at this time. Reviewed limitations, risks, security and privacy concerns of performing a virtual visit and the availability of in person appointments. I also reviewed that there may be a patient responsible charge related to this service. The patient agreed to proceed.   Patient location: home Provider location: [facility], office Persons participating in this virtual visit: patient, provider   If any vitals were documented, they were collected by patient at home unless specified below.    Ht 5\' 3"  (1.6 m)   Wt 205 lb (93 kg)   BMI 36.31 kg/m    CC: sinus congestion  Subjective:   HPI: Sheri [name] is a 30 y.o. female presenting on 08/17/2020 for Sinus Problem (Congestion and pressure at both eyes. X 4 weeks Exclusively breast feeding.  )   4 wk h/o cold/congestion symptoms. Ongoing sinus pressure, head congestion, purulent sputum when blowing nose. This feels like prior sinus infection.  Treating with ibuprofen and delsym.  Known allergic rhinitis - but avoiding antihistamines as worried they lower breast milk supply. Tried afrin PRN.   No fevers/chills, ear or tooth pain, cough, abd pain, nausea, ST. No trismus.   She is breastfeeding her daughter 08/19/2020.      Relevant past medical, surgical, family and social history reviewed and updated as indicated. Interim medical history since our last visit reviewed. Allergies and medications reviewed and updated. Outpatient Medications Prior to Visit  Medication Sig Dispense Refill  . aspirin EC 81 MG tablet Take 81 mg by mouth daily. Swallow whole.    . busPIRone (BUSPAR) 7.5 MG tablet Take 7.5 mg by mouth 2 (two)  times daily.    . ferrous sulfate 325 (65 FE) MG tablet Take 325 mg by mouth daily with breakfast.    . metFORMIN (GLUCOPHAGE) 500 MG tablet Take 500 mg by mouth in the morning and at bedtime.    . Prenatal Vit-Fe Fumarate-FA (PRENATAL PO) Take 1 tablet by mouth daily.    . sertraline (ZOLOFT) 50 MG tablet Take 75 mg by mouth at bedtime.      No facility-administered medications prior to visit.     Per HPI unless specifically indicated in ROS section below Review of Systems Objective:  Ht 5\' 3"  (1.6 m)   Wt 205 lb (93 kg)   BMI 36.31 kg/m   Wt Readings from Last 3 Encounters:  08/17/20 205 lb (93 kg)  08/12/20 205 lb (93 kg)  07/10/20 204 lb (92.5 kg)       Physical exam: Gen: alert, NAD, not ill appearing Pulm: speaks in complete sentences without increased work of breathing Psych: normal mood, normal thought content      Assessment & Plan:   Problem List Items Addressed This Visit    Acute sinusitis    Treat as bacterial sinusitis given duration and progression of symptoms. Rx amoxicillin 7d course, discussed if feeling better after 5 days may stop regimen early. Reviewed amoxicillin can enter breast milk and to be on the lookout for self limited diarrhea, GI upset or thrush in child. Reviewed further supportive care including regular nasal saline irrigation, fluids, rest - rec limit afrin, will not start topical INS given ?  presence in breast milk. Update if not improving with treatment.       Relevant Medications   amoxicillin (AMOXIL) 875 MG tablet       Meds ordered this encounter  Medications  . amoxicillin (AMOXIL) 875 MG tablet    Sig: Take 1 tablet (875 mg total) by mouth 2 (two) times daily.    Dispense:  14 tablet    Refill:  0   No orders of the defined types were placed in this encounter.   I discussed the assessment and treatment plan with the patient. The patient was provided an opportunity to ask questions and all were answered. The patient agreed  with the plan and demonstrated an understanding of the instructions. The patient was advised to call back or seek an in-person evaluation if the symptoms worsen or if the condition fails to improve as anticipated.  Follow up plan: No follow-ups on file.  Eustaquio Boyden, MD

## 2020-08-19 NOTE — Telephone Encounter (Signed)
Was seen virtual over weekend

## 2020-08-29 ENCOUNTER — Other Ambulatory Visit: Payer: Self-pay

## 2020-08-30 ENCOUNTER — Ambulatory Visit (INDEPENDENT_AMBULATORY_CARE_PROVIDER_SITE_OTHER): Payer: BC Managed Care – PPO | Admitting: Family Medicine

## 2020-08-30 ENCOUNTER — Encounter: Payer: Self-pay | Admitting: Family Medicine

## 2020-08-30 VITALS — BP 110/80 | HR 77 | Temp 98.2°F | Ht 63.0 in | Wt 205.8 lb

## 2020-08-30 DIAGNOSIS — G43909 Migraine, unspecified, not intractable, without status migrainosus: Secondary | ICD-10-CM

## 2020-08-30 MED ORDER — SUMATRIPTAN SUCCINATE 25 MG PO TABS: ORAL_TABLET | ORAL | 0 refills | Status: DC

## 2020-08-30 NOTE — Patient Instructions (Addendum)
Migraine Headache A migraine headache is an intense, throbbing pain on one side or both sides of the head. Migraine headaches may also cause other symptoms, such as nausea, vomiting, and sensitivity to light and noise. A migraine headache can last from 4 hours to 3 days. Talk with your doctor about what things may bring on (trigger) your migraine headaches. What are the causes? The exact cause of this condition is not known. However, a migraine may be caused when nerves in the brain become irritated and release chemicals that cause inflammation of blood vessels. This inflammation causes pain. This condition may be triggered or caused by:  Drinking alcohol.  Smoking.  Taking medicines, such as: ? Medicine used to treat chest pain (nitroglycerin). ? Birth control pills. ? Estrogen. ? Certain blood pressure medicines.  Eating or drinking products that contain nitrates, glutamate, aspartame, or tyramine. Aged cheeses, chocolate, or caffeine may also be triggers.  Doing physical activity. Other things that may trigger a migraine headache include:  Menstruation.  Pregnancy.  Hunger.  Stress.  Lack of sleep or too much sleep.  Weather changes.  Fatigue. What increases the risk? The following factors may make you more likely to experience migraine headaches:  Being a certain age. This condition is more common in people who are 25-55 years old.  Being female.  Having a family history of migraine headaches.  Being Caucasian.  Having a mental health condition, such as depression or anxiety.  Being obese. What are the signs or symptoms? The main symptom of this condition is pulsating or throbbing pain. This pain may:  Happen in any area of the head, such as on one side or both sides.  Interfere with daily activities.  Get worse with physical activity.  Get worse with exposure to bright lights or loud noises. Other symptoms may  include:  Nausea.  Vomiting.  Dizziness.  General sensitivity to bright lights, loud noises, or smells. Before you get a migraine headache, you may get warning signs (an aura). An aura may include:  Seeing flashing lights or having blind spots.  Seeing bright spots, halos, or zigzag lines.  Having tunnel vision or blurred vision.  Having numbness or a tingling feeling.  Having trouble talking.  Having muscle weakness. Some people have symptoms after a migraine headache (postdromal phase), such as:  Feeling tired.  Difficulty concentrating. How is this diagnosed? A migraine headache can be diagnosed based on:  Your symptoms.  A physical exam.  Tests, such as: ? CT scan or an MRI of the head. These imaging tests can help rule out other causes of headaches. ? Taking fluid from the spine (lumbar puncture) and analyzing it (cerebrospinal fluid analysis, or CSF analysis). How is this treated? This condition may be treated with medicines that:  Relieve pain.  Relieve nausea.  Prevent migraine headaches. Treatment for this condition may also include:  Acupuncture.  Lifestyle changes like avoiding foods that trigger migraine headaches.  Biofeedback.  Cognitive behavioral therapy. Follow these instructions at home: Medicines  Take over-the-counter and prescription medicines only as told by your health care provider.  Ask your health care provider if the medicine prescribed to you: ? Requires you to avoid driving or using heavy machinery. ? Can cause constipation. You may need to take these actions to prevent or treat constipation:  Drink enough fluid to keep your urine pale yellow.  Take over-the-counter or prescription medicines.  Eat foods that are high in fiber, such as beans, whole grains, and   fresh fruits and vegetables.  Limit foods that are high in fat and processed sugars, such as fried or sweet foods. Lifestyle  Do not drink alcohol.  Do not  use any products that contain nicotine or tobacco, such as cigarettes, e-cigarettes, and chewing tobacco. If you need help quitting, ask your health care provider.  Get at least 8 hours of sleep every night.  Find ways to manage stress, such as meditation, deep breathing, or yoga. General instructions  Keep a journal to find out what may trigger your migraine headaches. For example, write down: ? What you eat and drink. ? How much sleep you get. ? Any change to your diet or medicines.  If you have a migraine headache: ? Avoid things that make your symptoms worse, such as bright lights. ? It may help to lie down in a dark, quiet room. ? Do not drive or use heavy machinery. ? Ask your health care provider what activities are safe for you while you are experiencing symptoms.  Keep all follow-up visits as told by your health care provider. This is important.      Contact a health care provider if:  You develop symptoms that are different or more severe than your usual migraine headache symptoms.  You have more than 15 headache days in one month. Get help right away if:  Your migraine headache becomes severe.  Your migraine headache lasts longer than 72 hours.  You have a fever.  You have a stiff neck.  You have vision loss.  Your muscles feel weak or like you cannot control them.  You start to lose your balance often.  You have trouble walking.  You faint.  You have a seizure. Summary  A migraine headache is an intense, throbbing pain on one side or both sides of the head. Migraines may also cause other symptoms, such as nausea, vomiting, and sensitivity to light and noise.  This condition may be treated with medicines and lifestyle changes. You may also need to avoid certain things that trigger a migraine headache.  Keep a journal to find out what may trigger your migraine headaches.  Contact your health care provider if you have more than 15 headache days in a  month or you develop symptoms that are different or more severe than your usual migraine headache symptoms. This information is not intended to replace advice given to you by your health care provider. Make sure you discuss any questions you have with your health care provider. Document Revised: 07/29/2018 Document Reviewed: 05/19/2018 Elsevier Patient Education  2021 Elsevier Inc.  Breastfeeding and Low Milk Supply Breastfeeding can be challenging, especially during the first few weeks after childbirth. It is normal to have some problems when you start to breastfeed your new baby, even if you have breastfed before. The amount of breast milk you produce is based on a supply-and-demand system. One common cause of low milk supply is your baby not effectively emptying your breast. There may be other reasons you have a low milk supply. Work with your health care provider or a breastfeeding specialist (Advertising copywriter) to help overcome breastfeeding challenges. How does low milk supply affect me? You may have other problems if your baby is not completely emptying your breast or is not able to attach to your nipple (poor latch). Problems include:  Engorgement. This is when your breasts overfill with breast milk.  A blocked or plugged milk duct.  Cracked nipples and nipple pain.  Mastitis. This is  inflammation of the breast tissue. How does low milk supply affect my baby? If you have a low milk supply, your baby may become dehydrated or have trouble gaining weight. What actions can I take to manage the problem of low milk supply? Breastfeed often  Breastfeed when you feel the need to reduce the fullness of your breasts or when your baby shows signs of hunger. This is called "breastfeeding on demand."  Do not delay feedings. Feed your baby frequently. Breastfeed correctly  Make sure your baby is latched on to your breast and positioned properly when breastfeeding.  Try different  breastfeeding positions to find one that helps your baby feed more effectively.  Try to empty your breasts of milk at each feeding. Emptying your breasts signals them to produce more milk. Offer both sides at each feeding. Let your baby finish the first side, then offer the second side.  If your breast does not empty completely during a feeding, use a pump or squeeze with your hand (hand express) to get any remaining milk out.  Do not give your baby extra formula or food (supplemental feedings) unless your health care provider or lactation consultant tells you to do that.   Follow these instructions at home: Medicines  Let your health care provider know what over-the-counter or prescription medicines you are taking. Some medicines may affect your milk supply.  Talk to your health care provider or lactation consultant before taking any herbal supplements or medicines that may help increase milk supply. These medicines are called galactagogues. General instructions Take care of yourself. You should:  Rest. Try to sleep when baby sleeps.  Eat a balanced diet including whole grains, fruits and vegetables, and lean proteins.  Drink enough fluid to keep your urine pale yellow. Where to find more information  La Leche League: llli.org  American Academy of Pediatrics: healthychildren.org  Office on [employer] [facility]: https://www.west-[place]za.com/ Contact a health care provider if:  Your baby does not gain weight or loses weight.  You continue to have a low milk supply after following advice.  Your baby is older than 5 days and: ? He or she does not seem satisfied after feeding at the breast. ? He or she is not producing at least 5 or 6 wet diapers every 24 hours. ? He or she is not producing at least 3 stools every 24 hours. Summary  One common cause of low milk supply is your baby not effectively emptying your breast. This may cause your baby to not gain enough weight. Contact your  health care provider if your baby does not gain weight or if you continue to have a low milk supply after taking steps to address it.  To help with low milk supply, breastfeed when your baby shows signs of hunger, and pump after feeding to completely empty your breasts. Do not give your baby extra formula (supplemental feedings) unless your health care provider or lactation consultant tells you to do that.  Always make sure your baby is latched and positioned properly when breastfeeding. Try different positions to find one that works best for you and your baby. This information is not intended to replace advice given to you by your health care provider. Make sure you discuss any questions you have with your health care provider. Document Revised: 11/22/2019 Document Reviewed: 11/22/2019 Elsevier Patient Education  2021 Elsevier Inc.  Breastfeeding and Medicine Use Most medicines are safe for you to take while breastfeeding because only a small amount of medicine  passes into your breast milk. However, it is important to talk with your health care provider about all vaccines and medicines that you are taking while breastfeeding. These include:  Prescription medicines.  Over-the-counter medicines.  Vitamins, herbs, and supplements.  Eye drops.  Creams. Medicines that are commonly taken after delivering a baby are safe to take while breastfeeding. These include ibuprofen, acetaminophen, and stool softeners. How does this affect me? Some medicines may lessen your milk supply. Most women can continue to take those medicines for a short period of time with no effect on breastfeeding overall. Work with your health care provider or breastfeeding specialist (Advertising copywriter) to find ways to maintain your milk supply. How does this affect my baby? When you are breastfeeding, small amounts of medicines that you take can pass to your baby through your breast milk. In most cases, these small amounts  are not harmful to your baby. Some medicines may be present in larger amounts in your breast milk. To keep your baby safe, your health care provider might recommend that you stop breastfeeding while taking a certain medicine. You may need to stop breastfeeding for a short time or permanently. This will depend on how long you need to take the medicine. What can I do to lower my risk? Do not take any new medicines or get any vaccines unless you have talked about it with your health care provider and your baby's health care provider. Always read medicine labels before using a medicine. Check for risks for women who are breastfeeding. Avoid taking:  Over-the-counter cold and allergy medicines that contain pseudoephedrine. This ingredient can lessen your milk supply.  Medicines that are not medically necessary for you. These may include herbal medicines, high-dose vitamins, and some supplements. Work with your health care provider to determine which medicines you truly need. What actions can I take to manage this?  Try to take your medicine right after you breastfeed. This can help to limit your baby's exposure to the medicine the next time you breastfeed.  If you know ahead of time that you will need to stop breastfeeding for a short time, plan ahead. ? Before you start taking the medicine, pump and store a supply of breast milk. Feed this milk to your baby while you are taking the medicine. ? While you take the medicine, pump and throw away breast milk until you are no longer taking the medicine. Continuing to pump can help to make sure your body will be ready to breastfeed after you no longer need the medicine.   Follow these instructions at home:  If you start a new medicine, watch your baby for unusual signs such as sleepiness or irritability.  Do not stop taking a prescription medicine unless your health care provider tells you to stop. Talk with your health care provider about whether you  really need to take the medicine.  Take over-the-counter and prescription medicines only as told by your health care provider. Where to find more information  U.S. Food & Drug Administration: [place]Maps.no  Infant Risk Center: ? Online at [employer].com ? Hotline: 401-174-4454  Mother to Baby: mothertobaby.org Contact a health care provider if:  You or your baby develop new symptoms after you start taking a medicine.  You have trouble producing milk or your supply decreases.  Your baby is not gaining weight after you start taking a new medicine.  You have breast pain that does not get better with over-the-counter pain medicines. Summary  Most medicines  are safe to take while breastfeeding because the amount of medicine in breast milk is too small to harm the baby.  Do not take any new medicines or get any vaccines unless you have talked about it with your health care provider and your baby's health care provider.  If you start a new medicine, watch your baby for unusual signs such as sleepiness or irritability. This information is not intended to replace advice given to you by your health care provider. Make sure you discuss any questions you have with your health care provider. Document Revised: 11/02/2019 Document Reviewed: 11/02/2019 Elsevier Patient Education  2021 [employer]

## 2020-08-30 NOTE — Progress Notes (Signed)
Subjective:    Patient ID: Sheri [name], female    DOB: March 19, 1991, 30 y.o.   MRN: [id number]  Chief Complaint  Patient presents with  . Headache    Patient complains of headaches, x1 month,     HPI Patient was seen today for ongoing concern.  Pt with increased HAs x 1 month starting to feel like migraines.  Patient notes pressure behind bilateral eyes, states feels like "rocks pushing against eyes ".  Endorses light sensitivity, nausea.  Headache may last the entire day.  At times pt wakes up with a headache.  Pt drinking a cup pf coffee, 1 cup of unsweetened tea in 5 to six 32 ounces bottles of water daily.  Breast-feeding her 72-month-old daughter decreased milk supply.  Had to supplement with formula this wk.  Pt mentions taking ASA 81 mg daily after h/o PE and DVT years ago thought 2/2 OCP use.  Past Medical History:  Diagnosis Date  . Alcohol addiction (HCC)   . Anxiety   . Bipolar depression (HCC)    under psych rx   . Depression   . Drug addiction in remission [facility])    heroin  . Enteritis due to Norovirus 05/18/2016  . Hx of pulmonary embolus 02/13/2015  . Hyperlipidemia   . Hypokalemia 05/17/2016  . IUD (intrauterine device) in place 02/13/2015  . Mild mitral regurgitation   . Mild tricuspid regurgitation   . Mitral regurgitation 06/13/2018  . Near syncope 05/16/2016   a. felt due to norovirus.  . Nonallopathic lesion of cervical region 09/01/2018  . Nonallopathic lesion of rib cage 09/01/2018  . Obesity   . Orthostatic hypotension 05/18/2016  . PCOS (polycystic ovarian syndrome) 02/13/2015  . Pulmonary embolism (HCC) 2011   neg heme evaluation  felt to be from ocps   . Scapular dyskinesis 09/01/2018  . Smoker 12/30/2016  . Syncope and collapse 05/16/2016  . Thoracic outlet syndrome of left thoracic outlet 02/15/2018  . Thoracic radiculopathy due to degenerative joint disease of spine 10/13/2016   CT and XR show at least 3 levels of DDD of thoracic spine  I suspect this  is related to both the scoliosis and the history of dance    Allergies  Allergen Reactions  . Bupropion Other (See Comments)    ROS General: Denies fever, chills, night sweats, changes in weight, changes in appetite HEENT: Denies  ear pain, changes in vision, rhinorrhea, sore throat +HAs/migraine CV: Denies CP, palpitations, SOB, orthopnea Pulm: Denies SOB, cough, wheezing GI: Denies abdominal pain, nausea, vomiting, diarrhea, constipation GU: Denies dysuria, hematuria, frequency, vaginal discharge Msk: Denies muscle cramps, joint pains Neuro: Denies weakness, numbness, tingling Skin: Denies rashes, bruising Psych: Denies depression, anxiety, hallucinations     Objective:    Blood pressure 110/80, pulse 77, temperature 98.2 F (36.8 C), temperature source Oral, height 5\' 3"  (1.6 m), weight 205 lb 12.8 oz (93.4 kg), SpO2 98 %, currently breastfeeding.   Gen. Pleasant, well-nourished, in no distress, normal affect   HEENT: [place]/AT, face symmetric, conjunctiva clear, no scleral icterus, PERRLA, EOMI, nares patent without drainage Lungs: no accessory muscle use Cardiovascular: RRR,  no peripheral edema Abdomen: BS present, soft, NT/ND, no hepatosplenomegaly. Musculoskeletal: No deformities, no cyanosis or clubbing, normal tone Neuro:  A&Ox3, CN II-XII intact, normal gait Skin:  Warm, no lesions/ rash   Wt Readings from Last 3 Encounters:  08/30/20 205 lb 12.8 oz (93.4 kg)  08/17/20 205 lb (93 kg)  08/12/20 205 lb (93 kg)  Lab Results  Component Value Date   WBC 9.3 06/12/2020   HGB 12.5 06/12/2020   HCT 37.5 06/12/2020   PLT 386.0 06/12/2020   GLUCOSE 82 06/12/2020   CHOL 235 (H) 06/12/2020   TRIG 139.0 06/12/2020   HDL 66.40 06/12/2020   LDLDIRECT 150.0 02/13/2015   LDLCALC 141 (H) 06/12/2020   ALT 13 06/12/2020   AST 12 06/12/2020   NA 136 06/12/2020   K 4.3 06/12/2020   CL 101 06/12/2020   CREATININE 0.70 06/12/2020   BUN 12 06/12/2020   CO2 27 06/12/2020    TSH 3.13 06/12/2020   INR 1.0 12/11/2019   HGBA1C 5.8 06/12/2020    Assessment/Plan:  Migraine without status migrainosus, not intractable, unspecified migraine type -Currently without headache. -Discussed possible causes -Discussed staying hydrated, limiting caffeine intake, and getting rest-though difficult as patient has an 1-month-old. -Avoid Excedrin 2/2 as contains aspirin.  Rebound headaches possible with frequent ibuprofen use. -Discussed  R/b/a of Imitrex.  Patient advised an acceptable level of this medication is expressed in breastmilk. -Wait-and-see Rx for Imitrex sent to patient's pharmacy. - Plan: SUMAtriptan (IMITREX) 25 MG tablet  F/u prn   Abbe Amsterdam, MD

## 2020-08-31 ENCOUNTER — Encounter: Payer: Self-pay | Admitting: Family Medicine

## 2020-09-12 NOTE — Progress Notes (Signed)
[name] [address] [place] [address] Phone: [phone] Subjective:   Sheri [name], am serving as a scribe for Dr. Antoine Primas. This visit occurred during the SARS-CoV-2 public health emergency.  Safety protocols were in place, including screening questions prior to the visit, additional usage of staff PPE, and extensive cleaning of exam room while observing appropriate contact time as indicated for disinfecting solutions.   I'm seeing this patient by the request  of:  Panosh, Neta Mends, MD  CC: Wrist pain and back pain  [id number]  Sheri [name] is a 30 y.o. female coming in with complaint of back and neck pain. OMT 08/12/2020. Also f/u for R carpal tunnel. Patient did send MyChart message about getting injection.  Worsening symptoms with the carpal tunnel.  Keeping her up at night given symptoms.  Patient states that her wrist pain has not improved. Back is stiff currently.  Patient states that it has been a little more painful than usual.          Reviewed prior external information including notes and imaging from previsou exam, outside providers and external EMR if available.   As well as notes that were available from care everywhere and other healthcare systems.  Past medical history, social, surgical and family history all reviewed in electronic medical record.  No pertanent information unless stated regarding to the chief complaint.   Past Medical History:  Diagnosis Date  . Alcohol addiction (HCC)   . Anxiety   . Bipolar depression (HCC)    under psych rx   . Depression   . Drug addiction in remission [facility])    heroin  . Enteritis due to Norovirus 05/18/2016  . Hx of pulmonary embolus 02/13/2015  . Hyperlipidemia   . Hypokalemia 05/17/2016  . IUD (intrauterine device) in place 02/13/2015  . Mild mitral regurgitation   . Mild tricuspid regurgitation   . Mitral regurgitation 06/13/2018  . Near syncope  05/16/2016   a. felt due to norovirus.  . Nonallopathic lesion of cervical region 09/01/2018  . Nonallopathic lesion of rib cage 09/01/2018  . Obesity   . Orthostatic hypotension 05/18/2016  . PCOS (polycystic ovarian syndrome) 02/13/2015  . Pulmonary embolism (HCC) 2011   neg heme evaluation  felt to be from ocps   . Scapular dyskinesis 09/01/2018  . Smoker 12/30/2016  . Syncope and collapse 05/16/2016  . Thoracic outlet syndrome of left thoracic outlet 02/15/2018  . Thoracic radiculopathy due to degenerative joint disease of spine 10/13/2016   CT and XR show at least 3 levels of DDD of thoracic spine  I suspect this is related to both the scoliosis and the history of dance    Allergies  Allergen Reactions  . Bupropion Other (See Comments)     Review of Systems:  No headache, visual changes, nausea, vomiting, diarrhea, constipation, dizziness, abdominal pain, skin rash, fevers, chills, night sweats, weight loss, swollen lymph nodes, joint swelling, chest pain, shortness of breath, mood changes. POSITIVE muscle aches, body aches  Objective  Blood pressure 130/70, pulse 88, height 5\' 3"  (1.6 m), weight 209 lb (94.8 kg), SpO2 99 %, currently breastfeeding.   General: No apparent distress alert and oriented x3 mood and affect normal, dressed appropriately.  HEENT: Pupils equal, extraocular movements intact  Respiratory: Patient's speak in full sentences and does not appear short of breath  Cardiovascular: No lower extremity edema, non tender, no erythema  Gait normal with good balance and coordination.  MSK:  \Right wrist exam**patient does have a positive Tinel's sign.  Patient's grip strength is doing relatively well though. Back -low back exam does have some loss of lordosis.  Some tenderness to palpation of the paraspinal musculature.  Patient does have tightness in the thoracolumbar juncture as well as in the parascapular region right greater than left.  Scapular dyskinesis still  noted.  Osteopathic findings  C2 flexed rotated and side bent right T3 extended rotated and side bent right inhaled rib T9 extended rotated and side bent left L2 flexed rotated and side bent right Sacrum right on right  Procedure: Real-time Ultrasound Guided Injection of right carpal tunnel Device: GE Logiq Q7  Ultrasound guided injection is preferred based studies that show increased duration, increased effect, greater accuracy, decreased procedural pain, increased response rate with ultrasound guided versus blind injection.  Verbal informed consent obtained.  Time-out conducted.  Noted no overlying erythema, induration, or other signs of local infection.  Skin prepped in a sterile fashion.  Local anesthesia: Topical Ethyl chloride.  With sterile technique and under real time ultrasound guidance:  median nerve visualized.  25g 5/8 inch needle inserted distal to proximal approach into nerve sheath. Pictures taken nfor needle placement. Patient did have injection of 0.5 cc of 0.5% Marcaine, and 1 cc of Kenalog 40 mg/dL. Completed without difficulty  Pain over minutes resolved suggesting accurate placement of the medication.  Advised to call if fevers/chills, erythema, induration, drainage, or persistent bleeding.  Impression: Technically successful ultrasound guided injection.     Assessment and Plan:  Carpal tunnel syndrome Right-sided injected today.  Tolerated the procedure well.  Discussed icing regimen and home exercises.  Increase activity slowly.  Patient knows about the brace.  We will be careful with driving for the next 6 hours.  Follow-up again 6 to 8 weeks  Pain in thoracic spine Multifactorial.  Likely secondary to.  The young child.  Patient also likely been compensating for some of the carpal tunnel.  Injection of the carpal tunnel given today.  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  Increase activity slowly.  Follow-up with me again in 6 to 8 weeks     Nonallopathic problems  Decision today to treat with OMT was based on Physical Exam  After verbal consent patient was treated with HVLA, ME, FPR techniques in cervical, rib, thoracic, lumbar, and sacral  areas  Patient tolerated the procedure well with improvement in symptoms  Patient given exercises, stretches and lifestyle modifications  See medications in patient instructions if given  Patient will follow up in 4-8 weeks      The above documentation has been reviewed and is accurate and complete Judi Saa, DO       Note: This dictation was prepared with Dragon dictation along with smaller phrase technology. Any transcriptional errors that result from this process are unintentional.

## 2020-09-13 ENCOUNTER — Other Ambulatory Visit: Payer: Self-pay

## 2020-09-13 ENCOUNTER — Encounter: Payer: Self-pay | Admitting: Family Medicine

## 2020-09-13 ENCOUNTER — Ambulatory Visit (INDEPENDENT_AMBULATORY_CARE_PROVIDER_SITE_OTHER): Payer: BC Managed Care – PPO | Admitting: Family Medicine

## 2020-09-13 ENCOUNTER — Ambulatory Visit: Payer: Self-pay

## 2020-09-13 VITALS — BP 130/70 | HR 88 | Ht 63.0 in | Wt 209.0 lb

## 2020-09-13 DIAGNOSIS — M9902 Segmental and somatic dysfunction of thoracic region: Secondary | ICD-10-CM

## 2020-09-13 DIAGNOSIS — M9904 Segmental and somatic dysfunction of sacral region: Secondary | ICD-10-CM | POA: Diagnosis not present

## 2020-09-13 DIAGNOSIS — G5601 Carpal tunnel syndrome, right upper limb: Secondary | ICD-10-CM

## 2020-09-13 DIAGNOSIS — M25531 Pain in right wrist: Secondary | ICD-10-CM | POA: Diagnosis not present

## 2020-09-13 DIAGNOSIS — M9901 Segmental and somatic dysfunction of cervical region: Secondary | ICD-10-CM

## 2020-09-13 DIAGNOSIS — M9908 Segmental and somatic dysfunction of rib cage: Secondary | ICD-10-CM

## 2020-09-13 DIAGNOSIS — M9903 Segmental and somatic dysfunction of lumbar region: Secondary | ICD-10-CM | POA: Diagnosis not present

## 2020-09-13 DIAGNOSIS — M546 Pain in thoracic spine: Secondary | ICD-10-CM

## 2020-09-13 NOTE — Assessment & Plan Note (Signed)
Right-sided injected today.  Tolerated the procedure well.  Discussed icing regimen and home exercises.  Increase activity slowly.  Patient knows about the brace.  We will be careful with driving for the next 6 hours.  Follow-up again 6 to 8 weeks

## 2020-09-13 NOTE — Assessment & Plan Note (Signed)
Multifactorial.  Likely secondary to.  The young child.  Patient also likely been compensating for some of the carpal tunnel.  Injection of the carpal tunnel given today.  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  Increase activity slowly.  Follow-up with me again in 6 to 8 weeks

## 2020-09-13 NOTE — Patient Instructions (Signed)
Injected wrist today Any redness over weekend seek medical attention See me in 6-8 weeks

## 2020-09-26 ENCOUNTER — Ambulatory Visit: Payer: BC Managed Care – PPO | Admitting: Family Medicine

## 2020-09-26 ENCOUNTER — Other Ambulatory Visit: Payer: Self-pay | Admitting: Family Medicine

## 2020-09-26 DIAGNOSIS — G43909 Migraine, unspecified, not intractable, without status migrainosus: Secondary | ICD-10-CM

## 2020-10-03 ENCOUNTER — Encounter: Payer: Self-pay | Admitting: Family Medicine

## 2020-10-16 NOTE — Progress Notes (Signed)
[name] [address] [place] [address] Phone: [phone] Subjective:   I Sheri [name] am serving as a Neurosurgeon for Dr. Antoine [name].  This visit occurred during the SARS-CoV-2 public health emergency.  Safety protocols were in place, including screening questions prior to the visit, additional usage of staff PPE, and extensive cleaning of exam room while observing appropriate contact time as indicated for disinfecting solutions.   I'm seeing this patient by the request  of:  Sheri [name], Sheri Mends, MD  CC: Bilateral carpal tunnel left greater than right, back and neck pain follow-up  [id number]  09/13/2020 Right-sided injected today.  Tolerated the procedure well.  Discussed icing regimen and home exercises.  Increase activity slowly.  Patient knows about the brace.  We will be careful with driving for the next 6 hours.  Follow-up again 6 to 8 weeks  Multifactorial.  Likely secondary to.  The young child.  Patient also likely been compensating for some of the carpal tunnel.  Injection of the carpal tunnel given today.  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  Increase activity slowly.  Follow-up with me again in 6 to 8 weeks  Update 10/17/2020 Sheri [name] is a 30 y.o. female coming in with complaint of B carpal tunnel and back pain. Patient here for OMT and L wrist injection. Patient states she would like carpal tunnel injection. Back is ok today.  Having some mild tightness but nothing severe.      Past Medical History:  Diagnosis Date   Alcohol addiction (HCC)    Anxiety    Bipolar depression (HCC)    under psych rx    Depression    Drug addiction in remission [facility])    heroin   Enteritis due to Norovirus 05/18/2016   Hx of pulmonary embolus 02/13/2015   Hyperlipidemia    Hypokalemia 05/17/2016   IUD (intrauterine device) in place 02/13/2015   Mild mitral regurgitation    Mild tricuspid regurgitation    Mitral regurgitation  06/13/2018   Near syncope 05/16/2016   a. felt due to norovirus.   Nonallopathic lesion of cervical region 09/01/2018   Nonallopathic lesion of rib cage 09/01/2018   Obesity    Orthostatic hypotension 05/18/2016   PCOS (polycystic ovarian syndrome) 02/13/2015   Pulmonary embolism (HCC) 2011   neg heme evaluation  felt to be from ocps    Scapular dyskinesis 09/01/2018   Smoker 12/30/2016   Syncope and collapse 05/16/2016   Thoracic outlet syndrome of left thoracic outlet 02/15/2018   Thoracic radiculopathy due to degenerative joint disease of spine 10/13/2016   CT and XR show at least 3 levels of DDD of thoracic spine  I suspect this is related to both the scoliosis and the history of dance   Past Surgical History:  Procedure Laterality Date   NO PAST SURGERIES     Social History   Socioeconomic History   Marital status: Married    Spouse name: Not on file   Number of children: Not on file   Years of education: Not on file   Highest education level: Not on file  Occupational History   Occupation: nanny  Tobacco Use   Smoking status: Former    Pack years: 0.00    Types: Cigarettes    Quit date: 12/20/2015    Years since quitting: 4.8   Smokeless tobacco: Never  Vaping Use   Vaping Use: Never used  Substance and Sexual Activity  Alcohol use: No    Alcohol/week: 0.0 standard drinks   Drug use: No   Sexual activity: Not on file    Comment: previous IUD  Other Topics Concern   Not on file  Social History Narrative   5-10 hours of sleep per night   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)   Recovering from drug and alcohol addiction   Joined [employer]   Lives with her parents   2 dogs in the home      [place] 3 years  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    Current Outpatient Medications (Analgesics):    aspirin EC 81 MG tablet, Take 81 mg by mouth daily. Swallow whole.   SUMAtriptan (IMITREX) 25 MG tablet, TAKE 1 TABLET BY MOUTH AT START OF MIGRAINE.  Current Outpatient Medications (Hematological):    ferrous sulfate 325 (65 FE) MG tablet, Take 325 mg by mouth daily with breakfast.  Current Outpatient Medications (Other):    busPIRone (BUSPAR) 7.5 MG tablet, Take 7.5 mg by mouth 2 (two) times daily.   Prenatal Vit-Fe Fumarate-FA (PRENATAL PO), Take 1 tablet by mouth daily.   sertraline (ZOLOFT) 50 MG tablet, Take 75 mg by mouth at bedtime.    Reviewed prior external information including notes and imaging from  primary care provider As well as notes that were available from care everywhere and other healthcare systems.  Past medical history, social, surgical and family history all reviewed in electronic medical record.  No pertanent information unless stated regarding to the chief complaint.   Review of Systems:  No headache, visual changes, nausea, vomiting, diarrhea, constipation, dizziness, abdominal pain, skin rash, fevers, chills, night sweats, weight loss, swollen lymph nodes, body aches, joint swelling, chest pain, shortness of breath, mood changes. POSITIVE muscle aches numbness and tingling in the left hand  Objective  Blood pressure 122/82, pulse 95, height 5\' 3"  (1.6 m), weight 209 lb (94.8 kg), SpO2 98 %, currently breastfeeding.   General: No apparent distress alert and oriented x3 mood and affect normal, dressed appropriately.  HEENT: Pupils equal,  extraocular movements intact  Respiratory: Patient's speak in full sentences and does not appear short of breath  Cardiovascular: No lower extremity edema, non tender, no erythema  Gait normal with good balance and coordination.  MSK: Patient's left hand does have a positive Tinel's noted.  No thenar eminence wasting.  No weakness noted.  Patient does have good range of motion otherwise. Upper back does have tightness noted in the parascapular region right greater than left.  Tenderness in the thoracolumbar juncture also noted.  Procedure: Real-time Ultrasound Guided Injection of right carpal tunnel Device: GE Logiq Q7 Ultrasound guided injection is preferred based studies that show increased duration, increased effect, greater accuracy, decreased procedural pain, increased response rate with ultrasound guided versus blind injection.  Verbal informed consent obtained.  Time-out conducted.  Noted no overlying erythema, induration, or other signs of local infection.  Skin prepped in a sterile fashion.  Local anesthesia: Topical Ethyl chloride.  With sterile technique and under real time ultrasound guidance:  median nerve visualized.  23g 5/8 inch needle inserted distal to proximal approach into nerve sheath. Pictures taken nfor needle placement. Patient did have injection of 0.5 cc of 0.5% Marcaine, and 0.5 cc of Kenalog 40 mg/dL. Completed without difficulty  Pain immediately resolved suggesting accurate placement of the medication.  Advised to call if fevers/chills, erythema, induration, drainage, or persistent bleeding.    Impression: Technically successful ultrasound guided injection.  Osteopathic findings C2 flexed rotated and side bent right C4 flexed rotated and side bent left C6 flexed rotated and side bent left T3 extended rotated and side bent right inhaled third rib T9 extended rotated and side bent left L2 flexed rotated and side bent right Sacrum right on right     Impression and Recommendations:     The above documentation has been reviewed and is accurate and complete Judi Saa, DO

## 2020-10-17 ENCOUNTER — Ambulatory Visit (INDEPENDENT_AMBULATORY_CARE_PROVIDER_SITE_OTHER): Payer: BC Managed Care – PPO | Admitting: Family Medicine

## 2020-10-17 ENCOUNTER — Ambulatory Visit: Payer: Self-pay

## 2020-10-17 ENCOUNTER — Encounter: Payer: Self-pay | Admitting: Family Medicine

## 2020-10-17 ENCOUNTER — Other Ambulatory Visit: Payer: Self-pay

## 2020-10-17 VITALS — BP 122/82 | HR 95 | Ht 63.0 in | Wt 209.0 lb

## 2020-10-17 DIAGNOSIS — M9903 Segmental and somatic dysfunction of lumbar region: Secondary | ICD-10-CM | POA: Diagnosis not present

## 2020-10-17 DIAGNOSIS — M9908 Segmental and somatic dysfunction of rib cage: Secondary | ICD-10-CM

## 2020-10-17 DIAGNOSIS — G5602 Carpal tunnel syndrome, left upper limb: Secondary | ICD-10-CM

## 2020-10-17 DIAGNOSIS — G2589 Other specified extrapyramidal and movement disorders: Secondary | ICD-10-CM | POA: Diagnosis not present

## 2020-10-17 DIAGNOSIS — M9904 Segmental and somatic dysfunction of sacral region: Secondary | ICD-10-CM

## 2020-10-17 DIAGNOSIS — M9901 Segmental and somatic dysfunction of cervical region: Secondary | ICD-10-CM

## 2020-10-17 DIAGNOSIS — M9902 Segmental and somatic dysfunction of thoracic region: Secondary | ICD-10-CM

## 2020-10-17 NOTE — Assessment & Plan Note (Signed)
Continue to work on the scapular dyskinesis.  Discussed which activities to do which wants to avoid.  Increase activity slowly.  Follow-up with me again in 6 to 8 weeks

## 2020-10-17 NOTE — Patient Instructions (Signed)
Good to see you You can always call and check about the insurance Continue exercises stay active See me again when you need me

## 2020-10-17 NOTE — Assessment & Plan Note (Signed)
Patient given injection and tolerated the procedure well, discussed icing regimen and home exercises, we discussed that we would like to avoid doing too many of these in the long-term but I do expect patient to do relatively well.  Continuing to have difficulty we may need to consider the possibility of nerve conduction study.  Follow-up with me again in 8 weeks but patient may be switching insurances and does not know if we will be covered.

## 2020-11-18 NOTE — Telephone Encounter (Signed)
So sounds viral and antibiotic drops may not help arrange a virtual visit to assess and discuss. If there is loss of vision or serious eye pain that becomes more of an emergent evaluation.

## 2020-11-20 ENCOUNTER — Other Ambulatory Visit: Payer: Self-pay

## 2020-11-20 ENCOUNTER — Telehealth (INDEPENDENT_AMBULATORY_CARE_PROVIDER_SITE_OTHER): Payer: BC Managed Care – PPO | Admitting: Family Medicine

## 2020-11-20 DIAGNOSIS — J069 Acute upper respiratory infection, unspecified: Secondary | ICD-10-CM

## 2020-11-20 DIAGNOSIS — H109 Unspecified conjunctivitis: Secondary | ICD-10-CM

## 2020-11-20 MED ORDER — BENZONATATE 100 MG PO CAPS: 100.0000 mg | ORAL_CAPSULE | Freq: Three times a day (TID) | ORAL | 0 refills | Status: DC | PRN

## 2020-11-20 MED ORDER — POLYMYXIN B-TRIMETHOPRIM 10000-0.1 UNIT/ML-% OP SOLN: 2.0000 [drp] | OPHTHALMIC | 0 refills | Status: DC

## 2020-11-20 NOTE — Progress Notes (Signed)
Patient ID: Sheri [name], female   DOB: February 04, 1991, 30 y.o.   MRN: [id number]   This visit type was conducted due to national recommendations for restrictions regarding the COVID-19 pandemic in an effort to limit this patient's exposure and mitigate transmission in our community.   Virtual Visit via Video Note  I connected with Sheri [name] on 11/20/20 at 10:00 AM EDT by a video enabled telemedicine application and verified that I am speaking with the correct person using two identifiers.  Location patient: home Location provider:work or home office Persons participating in the virtual visit: patient, provider  I discussed the limitations of evaluation and management by telemedicine and the availability of in person appointments. The patient expressed understanding and agreed to proceed.   HPI:  Sheri [name] relates onset about a week ago today of some headache, mild body aches, nasal congestion, sore throat.  She took 3 COVID test at home spaced over several days and all negative.  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  She has used albuterol inhaler with slight relief in her cough.  Cough has been severe at times especially at night and not relieved with over-the-counter medications.  Interfering with sleep.  No documented fever.  Also relates crusted drainage right eye past few days.  One of the children she takes care of has bacterial conjunctivitis currently being treated.  No vision changes.  No eye pain.  ROS: See pertinent positives and negatives per HPI.  Past Medical History:  Diagnosis Date   Alcohol addiction (HCC)    Anxiety    Bipolar depression (HCC)    under psych rx    Depression    Drug addiction in remission [facility])    heroin   Enteritis due to Norovirus 05/18/2016   Hx of pulmonary embolus 02/13/2015   Hyperlipidemia    Hypokalemia 05/17/2016   IUD (intrauterine device) in place  02/13/2015   Mild mitral regurgitation    Mild tricuspid regurgitation    Mitral regurgitation 06/13/2018   Near syncope 05/16/2016   a. felt due to norovirus.   Nonallopathic lesion of cervical region 09/01/2018   Nonallopathic lesion of rib cage 09/01/2018   Obesity    Orthostatic hypotension 05/18/2016   PCOS (polycystic ovarian syndrome) 02/13/2015   Pulmonary embolism (HCC) 2011   neg heme evaluation  felt to be from ocps    Scapular dyskinesis 09/01/2018   Smoker 12/30/2016   Syncope and collapse 05/16/2016   Thoracic outlet syndrome of left thoracic outlet 02/15/2018   Thoracic radiculopathy due to degenerative joint disease of spine 10/13/2016   CT and XR show at least 3 levels of DDD of thoracic spine  I suspect this is related to both the scoliosis and the history of dance    Past Surgical History:  Procedure Laterality Date   NO PAST SURGERIES      Family History  Problem Relation Age of Onset   Colon cancer Maternal Grandfather    Hypertension Father    Irritable bowel syndrome Father     SOCIAL HX: Non-smoker   Current Outpatient Medications:    aspirin EC 81 MG tablet, Take 81 mg by mouth daily. Swallow whole., Disp: , Rfl:    benzonatate (TESSALON PERLES) 100 MG capsule, Take 1 capsule (100 mg total) by mouth 3 (three) times daily as needed for cough., Disp: 30 capsule, Rfl: 0   busPIRone (BUSPAR) 7.5 MG tablet, Take  7.5 mg by mouth 2 (two) times daily., Disp: , Rfl:    ferrous sulfate 325 (65 FE) MG tablet, Take 325 mg by mouth daily with breakfast., Disp: , Rfl:    metFORMIN (GLUCOPHAGE) 500 MG tablet, Take 500 mg by mouth in the morning and at bedtime., Disp: , Rfl:    Prenatal Vit-Fe Fumarate-FA (PRENATAL PO), Take 1 tablet by mouth daily., Disp: , Rfl:    sertraline (ZOLOFT) 50 MG tablet, Take 75 mg by mouth at bedtime. , Disp: , Rfl:    SUMAtriptan (IMITREX) 25 MG tablet, TAKE 1 TABLET BY MOUTH AT START OF MIGRAINE., Disp: 10 tablet, Rfl: 1    trimethoprim-polymyxin b (POLYTRIM) ophthalmic solution, Place 2 drops into the right eye every 4 (four) hours., Disp: 10 mL, Rfl: 0  EXAM:  VITALS per patient if applicable:  GENERAL: alert, oriented, appears well and in no acute distress  HEENT: atraumatic, conjunttiva clear, no obvious abnormalities on inspection of external nose and ears  NECK: normal movements of the head and neck  LUNGS: on inspection no signs of respiratory distress, breathing rate appears normal, no obvious gross SOB, gasping or wheezing  CV: no obvious cyanosis  MS: moves all visible extremities without noticeable abnormality  PSYCH/NEURO: pleasant and cooperative, no obvious depression or anxiety, speech and thought processing grossly intact  ASSESSMENT AND PLAN:  Discussed the following assessment and plan:  Viral URI with cough  Bacterial conjunctivitis of right eye  -Recommend trial of Tessalon Perles 100 mg every 8 hours as needed for cough.  She is in recovery from addiction in the past and avoid codeine or hydrocodone because strips  -Follow-up promptly for any fever or any persistent or worsening symptoms  -Polytrim ophthalmic drops 2 drops right eye every 4 hours while awake and continue warm compresses and be in touch if eye symptoms not clearing in the next few days.   I discussed the assessment and treatment plan with the patient. The patient was provided an opportunity to ask questions and all were answered. The patient agreed with the plan and demonstrated an understanding of the instructions.   The patient was advised to call back or seek an in-person evaluation if the symptoms worsen or if the condition fails to improve as anticipated.     Evelena Peat, MD

## 2020-11-21 ENCOUNTER — Encounter: Payer: Self-pay | Admitting: Family Medicine

## 2020-11-26 MED ORDER — AMOXICILLIN 875 MG PO TABS: ORAL_TABLET | ORAL | 0 refills | Status: DC

## 2021-02-05 ENCOUNTER — Telehealth: Payer: Self-pay | Admitting: Cardiology

## 2021-02-05 NOTE — Telephone Encounter (Addendum)
Pt is calling in with complaints of tachycardia noted on apple watch, over the last 48 hrs.  Pt states her heart rates at rest are running anywhere from upper 90s to 120s, and highest 140s.  Pt states she tried doing an EKG through her watch, but this came back inconclusive.  Pt states over the last 48 hours her heart rate intermittently can run from 100 bpm and fastest 140 bpm.  Pt states this is non-sustained. Pt states when her rates are elevated, she does get lightheaded, anxious, and nauseated.   Pt denies sob, doe, diaphoresis, pre-syncopal or syncopal episodes.  She states she does NOT feel at all the way she felt when she had a PE in the past.  Pt states she tries utilizing relaxation techniques, takes her anxiety meds, practices deep breathing, and moves around to help with this.  She states this does help but she is still concerned, given history of a PE in the past.  Pt had a PE back in 2011 felt to be caused from oral contraceptives.  She is on ASA and confirms taking this daily.  Pt states she does consume caffeine in the morning and evening time.  She states she is being very mindful to stay plenty hydrated with water.  Pt would like to make an appt for sometime this week for further evaluation of this.  Pt was a former Dr. Delton See pt, and is recalled to establish with Dr. Shari Prows in early Jan 2023.  Scheduled the pt to come into the office and see DOD Dr. Anne Fu for tomorrow 10/20 at 3:30 pm.  Advised her to arrive 15 mins prior to this appt.   Advised the pt to limit her caffeine and chocolate intake.  Advised her to stay plenty hydrated and continue with relaxation techniques as needed.  Advised her to continue monitoring her rates and bring those recordings into the visit with Dr. Anne Fu tomorrow.  ED precautions provided to the pt if symptoms were to worsen or persist in the meantime between now and her appt tomorrow.   Pt education with s/s discussed that would warrant immediate  medical attention, especially given history of PE.  Informed the pt that I will route this message to Dr. Devin Going in-basket for covering to review as an FYI and to make aware of plan discussed with pt.  Pt verbalized understanding and agrees with this plan. Pt was more than gracious for all the assistance provided.

## 2021-02-05 NOTE — Telephone Encounter (Signed)
STAT if HR is under 50 or over 120 (normal HR is 60-100 beats per minute)  What is your heart rate? 93  Do you have a log of your heart rate readings (document readings)? yes  Do you have any other symptoms? A little bit of lightheadedness

## 2021-02-06 ENCOUNTER — Ambulatory Visit: Payer: BC Managed Care – PPO | Admitting: Cardiology

## 2021-02-06 ENCOUNTER — Ambulatory Visit (INDEPENDENT_AMBULATORY_CARE_PROVIDER_SITE_OTHER): Payer: BC Managed Care – PPO

## 2021-02-06 ENCOUNTER — Encounter: Payer: Self-pay | Admitting: Cardiology

## 2021-02-06 ENCOUNTER — Other Ambulatory Visit: Payer: Self-pay

## 2021-02-06 VITALS — BP 110/70 | HR 80 | Ht 63.0 in | Wt 218.0 lb

## 2021-02-06 DIAGNOSIS — R002 Palpitations: Secondary | ICD-10-CM | POA: Insufficient documentation

## 2021-02-06 NOTE — Progress Notes (Unsigned)
Patient enrolled for [employer] to mail a 14 day ZIO XT monitor to her address on file. 

## 2021-02-06 NOTE — Assessment & Plan Note (Addendum)
We will go ahead and check a Zio patch monitor to see her overall average heart rate.  Her heart rate greater than 120 for over 10 minutes on her apple watch could have been triggered by anxiety for instance.  This is not unusual.  If heart rates were markedly elevated for an extended period of time this could be indicative of arrhythmia.  Zio patch monitor will make sure that there are no adverse arrhythmias detected.  She is not drinking excessive caffeine.  It is challenging for her to exercise with a 84-year-old.  I suspicion at this time for recurrent PE is low given her overall normal heart rate recovery, no evidence of any extended chest discomfort cough.

## 2021-02-06 NOTE — Progress Notes (Signed)
Cardiology Office Note:    Date:  02/06/2021   ID:  Sheri [name], DOB Sep 12, 1990, MRN [id number]  PCP:  Madelin Headings, MD   [facility] Providers Cardiologist:  Tobias Alexander, MD (Inactive)     Referring MD: Madelin Headings, MD    History of Present Illness:    Sheri [name] is a 30 y.o. female noted some tachycardia on apple watch over the past 48 hours.  Heart rates can be anywhere from 90s to 120s and the highest are 140s.  She was trying to do the EKG through the watch but this was inconclusive.  At work at [employer] watch said >120 for 10 min.   She feels somewhat lightheaded anxious and nauseated when they are elevated.  She has had pulmonary embolism in the past but this is different.  She tries relaxation techniques anxiety meds deep breathing.  Pulmonary embolism in 2011 was caused from oral contraception.  She has been on aspirin.  Past Medical History:  Diagnosis Date   Alcohol addiction (HCC)    Anxiety    Bipolar depression (HCC)    under psych rx    Depression    Drug addiction in remission [facility])    heroin   Enteritis due to Norovirus 05/18/2016   Hx of pulmonary embolus 02/13/2015   Hyperlipidemia    Hypokalemia 05/17/2016   IUD (intrauterine device) in place 02/13/2015   Mild mitral regurgitation    Mild tricuspid regurgitation    Mitral regurgitation 06/13/2018   Near syncope 05/16/2016   a. felt due to norovirus.   Nonallopathic lesion of cervical region 09/01/2018   Nonallopathic lesion of rib cage 09/01/2018   Obesity    Orthostatic hypotension 05/18/2016   PCOS (polycystic ovarian syndrome) 02/13/2015   Pulmonary embolism (HCC) 2011   neg heme evaluation  felt to be from ocps    Scapular dyskinesis 09/01/2018   Smoker 12/30/2016   Syncope and collapse 05/16/2016   Thoracic outlet syndrome of left thoracic outlet 02/15/2018   Thoracic radiculopathy due to degenerative joint disease of spine 10/13/2016   CT and XR show at least 3  levels of DDD of thoracic spine  I suspect this is related to both the scoliosis and the history of dance    Past Surgical History:  Procedure Laterality Date   NO PAST SURGERIES      Current Medications: Current Meds  Medication Sig   aspirin EC 81 MG tablet Take 81 mg by mouth daily. Swallow whole.   busPIRone (BUSPAR) 7.5 MG tablet Take 7.5 mg by mouth 2 (two) times daily.   ferrous sulfate 325 (65 FE) MG tablet Take 325 mg by mouth daily with breakfast.   metFORMIN (GLUCOPHAGE) 500 MG tablet Take 500 mg by mouth in the morning and at bedtime.   sertraline (ZOLOFT) 100 MG tablet Take 100 mg by mouth daily.   sertraline (ZOLOFT) 50 MG tablet Take 75 mg by mouth at bedtime.    SUMAtriptan (IMITREX) 25 MG tablet TAKE 1 TABLET BY MOUTH AT START OF MIGRAINE.   trimethoprim-polymyxin b (POLYTRIM) ophthalmic solution Place 2 drops into the right eye every 4 (four) hours.     Allergies:   Bupropion   Social History   Socioeconomic History   Marital status: Married    Spouse name: Not on file   Number of children: Not on file   Years of education: Not on file   Highest education level: Not on file  Occupational History   Occupation: nanny  Tobacco Use   Smoking status: Former    Types: Cigarettes    Quit date: 12/20/2015    Years since quitting: 5.1   Smokeless tobacco: Never  Vaping Use   Vaping Use: Never used  Substance and Sexual Activity   Alcohol use: No    Alcohol/week: 0.0 standard drinks   Drug use: No   Sexual activity: Not on file    Comment: previous IUD  Other Topics Concern   Not on file  Social History Narrative   5-10 hours of sleep per night   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  ROS:   Please see the history of present illness.     All other systems reviewed and are negative.  EKGs/Labs/Other Studies Reviewed:    The following studies were reviewed today: ECHO 09/19/19:    1. Left ventricular ejection fraction, by estimation, is 65 to 70%. The  left ventricle has normal function. The left ventricle has no regional  wall motion abnormalities. Left ventricular diastolic parameters were  normal.   2. Right ventricular systolic function is normal. The right ventricular  size is normal. There is normal pulmonary artery systolic pressure.   3. The mitral valve is normal in structure. Trivial mitral valve  regurgitation. No evidence of mitral stenosis.   4. The aortic valve is normal in structure. Aortic valve regurgitation is  not visualized. No aortic stenosis is present.   5. The inferior vena cava is normal in size with greater than 50%  respiratory variability, suggesting right atrial pressure of 3 mmHg.   EKG:  EKG is  ordered today.  The ekg ordered today demonstrates SR 80  Recent Labs: 06/12/2020: ALT 13; BUN 12; Creatinine, Ser 0.70; Hemoglobin 12.5; Platelets 386.0; Potassium 4.3; Sodium 136; TSH 3.13  Recent Lipid Panel    Component Value Date/Time   CHOL 235 (H) 06/12/2020 1045   TRIG 139.0 06/12/2020 1045   HDL 66.40 06/12/2020 1045   CHOLHDL 4 06/12/2020 1045   VLDL 27.8 06/12/2020 1045   LDLCALC 141 (H) 06/12/2020 1045   LDLDIRECT 150.0 02/13/2015 1449     Risk Assessment/Calculations:          Physical Exam:    VS:  BP 110/70 (BP Location: Left Arm, Patient Position: Sitting, Cuff Size: Large)   Pulse 80   Ht 5\' 3"  (1.6 m)   Wt 218 lb  (98.9 kg)   BMI 38.62 kg/m     Wt Readings from Last 3 Encounters:  02/06/21 218 lb (98.9 kg)  10/17/20 209 lb (94.8 kg)  09/13/20 209 lb (94.8 kg)     GEN:  Well nourished, well developed in no acute distress HEENT: Normal NECK: No JVD; No carotid bruits LYMPHATICS: No lymphadenopathy CARDIAC: RRR, no murmurs, rubs, gallops RESPIRATORY:  Clear to auscultation without rales, wheezing or rhonchi  ABDOMEN: Soft, non-tender, non-distended MUSCULOSKELETAL:  No edema; No deformity  SKIN: Warm and dry NEUROLOGIC:  Alert and oriented x 3 PSYCHIATRIC:  Normal affect   ASSESSMENT:    1. Palpitations    PLAN:    In order of problems listed above:  Palpitations We will go ahead and check a Zio patch monitor to see her overall average heart rate.  Her heart rate greater than 120 for over 10 minutes on her apple watch could have been triggered by anxiety for instance.  This is not unusual.  If heart rates were markedly elevated for an extended period of time this could be indicative of arrhythmia.  Zio patch monitor will make sure that there are no adverse arrhythmias detected.  She is not drinking excessive caffeine.  It is challenging for her to exercise with a 19-year-old.  I suspicion at this time for recurrent PE is low given her overall normal heart rate recovery, no evidence of any extended chest discomfort cough.         Medication Adjustments/Labs and Tests Ordered: Current medicines are reviewed at length with the patient today.  Concerns regarding medicines are outlined above.  Orders Placed This Encounter  Procedures   LONG TERM MONITOR (3-14 DAYS)   EKG 12-Lead    No orders of the defined types were placed in this encounter.   Patient Instructions  Medication Instructions:  The current medical regimen is effective;  continue present plan and medications.  *If you need a refill on your cardiac medications before your next appointment, please call your  pharmacy*  Testing/Procedures: ZIO XT- Long Term Monitor Instructions  Your physician has requested you wear a ZIO patch monitor for 14 days.  This is a single patch monitor. [employer] supplies one patch monitor per enrollment. Additional stickers are not available. Please do not apply patch if you will be having a Nuclear Stress Test,  Echocardiogram, Cardiac CT, MRI, or Chest Xray during the period you would be wearing the  monitor. The patch cannot be worn during these tests. You cannot remove and re-apply the  ZIO XT patch monitor.  Your ZIO patch monitor will be mailed 3 day USPS to your address on file. It may take 3-5 days  to receive your monitor after you have been enrolled.  Once you have received your monitor, please review the enclosed instructions. Your monitor  has already been registered assigning a specific monitor serial # to you.  Billing and Patient Assistance Program Information  We have supplied [employer] with any of your insurance information on file for billing purposes. [employer] offers a sliding scale Patient Assistance Program for patients that do not have  insurance, or whose insurance does not completely cover the cost of the ZIO monitor.  You must apply for the Patient Assistance Program to qualify for this discounted rate.  To apply, please call [employer] at [phone], select option 4, select option 2, ask to apply for  Patient Assistance Program. Meredeth Ide will ask your household income, and how many people  are in your household. They will quote your out-of-pocket cost based on that information.  [employer] will also be able to set up a 25-month, interest-free payment plan if needed.  Applying the monitor   Shave hair from upper left chest.  Hold abrader disc by orange tab. Rub abrader in 40 strokes over the upper left chest as  indicated in your monitor instructions.  Clean area  with 4 enclosed alcohol pads. Let dry.  Apply patch as indicated in monitor  instructions. Patch will be placed under collarbone on left  side of chest with arrow pointing upward.  Rub patch adhesive wings for 2 minutes. Remove white label marked "1". Remove the white  label marked "2". Rub patch adhesive wings for 2 additional minutes.  While looking in a mirror, press and release button in center of patch. A small green light will  flash 3-4 times. This will be your only indicator that the monitor has been turned on.  Do not shower for the first 24 hours. You may shower after the first 24 hours.  Press the button if you feel a symptom. You will hear a small click. Record Date, Time and  Symptom in the Patient Logbook.  When you are ready to remove the patch, follow instructions on the last 2 pages of Patient  Logbook. Stick patch monitor onto the last page of Patient Logbook.  Place Patient Logbook in the blue and white box. Use locking tab on box and tape box closed  securely. The blue and white box has prepaid postage on it. Please place it in the mailbox as  soon as possible. Your physician should have your test results approximately 7 days after the  monitor has been mailed back to [facility].  Call [facility] Customer Care at [phone] if you have questions regarding  your ZIO XT patch monitor. Call them immediately if you see an orange light blinking on your  monitor.  If your monitor falls off in less than 4 days, contact our Monitor department at [phone].  If your monitor becomes loose or falls off after 4 days call [employer] at [phone] for  suggestions on securing your monitor  Follow-Up: At [facility], you and your health needs are our priority.  As part of our continuing mission to provide you with exceptional heart care, we have created designated Provider Care Teams.  These Care Teams include your primary Cardiologist (physician) and Advanced Practice Providers (APPs -  Physician Assistants and Nurse Practitioners) who all work  together to provide you with the care you need, when you need it.  We recommend signing up for the patient portal called "MyChart".  Sign up information is provided on this After Visit Summary.  MyChart is used to connect with patients for Virtual Visits (Telemedicine).  Patients are able to view lab/test results, encounter notes, upcoming appointments, etc.  Non-urgent messages can be sent to your provider as well.   To learn more about what you can do with MyChart, go to [email or web address].    Your next appointment:   Follow up as instructed by  Provider:   Laurance Flatten, MD   Thank you for choosing [facility]!!     Signed, Donato Schultz, MD  02/06/2021 4:17 PM    [facility]

## 2021-02-06 NOTE — Patient Instructions (Signed)
Medication Instructions:  The current medical regimen is effective;  continue present plan and medications.  *If you need a refill on your cardiac medications before your next appointment, please call your pharmacy*  Testing/Procedures: [place] Monitor Instructions  Your physician has requested you wear a ZIO patch monitor for 14 days.  This is a single patch monitor. [employer] supplies one patch monitor per enrollment. Additional stickers are not available. Please do not apply patch if you will be having a Nuclear Stress Test,  Echocardiogram, Cardiac CT, MRI, or Chest Xray during the period you would be wearing the  monitor. The patch cannot be worn during these tests. You cannot remove and re-apply the  ZIO XT patch monitor.  Your ZIO patch monitor will be mailed 3 day USPS to your address on file. It may take 3-5 days  to receive your monitor after you have been enrolled.  Once you have received your monitor, please review the enclosed instructions. Your monitor  has already been registered assigning a specific monitor serial # to you.  Billing and Patient Assistance Program Information  We have supplied [employer] with any of your insurance information on file for billing purposes. [employer] offers a sliding scale Patient Assistance Program for patients that do not have  insurance, or whose insurance does not completely cover the cost of the ZIO monitor.  You must apply for the Patient Assistance Program to qualify for this discounted rate.  To apply, please call [employer] at [phone], select option 4, select option 2, ask to apply for  Patient Assistance Program. Theodore Demark will ask your household income, and how many people  are in your household. They will quote your out-of-pocket cost based on that information.  [employer] will also be able to set up a 24-month interest-free payment plan if needed.  Applying the monitor   Shave hair from upper left chest.  Hold abrader disc  by orange tab. Rub abrader in 40 strokes over the upper left chest as  indicated in your monitor instructions.  Clean area with 4 enclosed alcohol pads. Let dry.  Apply patch as indicated in monitor instructions. Patch will be placed under collarbone on left  side of chest with arrow pointing upward.  Rub patch adhesive wings for 2 minutes. Remove white label marked "1". Remove the white  label marked "2". Rub patch adhesive wings for 2 additional minutes.  While looking in a mirror, press and release button in center of patch. A small green light will  flash 3-4 times. This will be your only indicator that the monitor has been turned on.  Do not shower for the first 24 hours. You may shower after the first 24 hours.  Press the button if you feel a symptom. You will hear a small click. Record Date, Time and  Symptom in the Patient Logbook.  When you are ready to remove the patch, follow instructions on the last 2 pages of Patient  Logbook. Stick patch monitor onto the last page of Patient Logbook.  Place Patient Logbook in the blue and white box. Use locking tab on box and tape box closed  securely. The blue and white box has prepaid postage on it. Please place it in the mailbox as  soon as possible. Your physician should have your test results approximately 7 days after the  monitor has been mailed back to [facility]  Call [phone] you have questions regarding  your ZIO XT patch  monitor. Call them immediately if you see an orange light blinking on your  monitor.  If your monitor falls off in less than 4 days, contact our Monitor department at [phone].  If your monitor becomes loose or falls off after 4 days call [employer] at [phone] for  suggestions on securing your monitor  Follow-Up: At [facility], you and your health needs are our priority.  As part of our continuing mission to provide you with exceptional heart care, we have  created designated Provider Care Teams.  These Care Teams include your primary Cardiologist (physician) and Advanced Practice Providers (APPs -  Physician Assistants and Nurse Practitioners) who all work together to provide you with the care you need, when you need it.  We recommend signing up for the patient portal called "MyChart".  Sign up information is provided on this After Visit Summary.  MyChart is used to connect with patients for Virtual Visits (Telemedicine).  Patients are able to view lab/test results, encounter notes, upcoming appointments, etc.  Non-urgent messages can be sent to your provider as well.   To learn more about what you can do with MyChart, go to [email or web address].    Your next appointment:   Follow up as instructed by  Provider:   Laurance Flatten, MD   Thank you for choosing [facility]!!

## 2021-02-06 NOTE — Telephone Encounter (Signed)
Pt saw Dr. Anne Fu in clinic today, for DOD add-on.  Please refer to OV note for further plan on this pt.

## 2021-02-08 DIAGNOSIS — R002 Palpitations: Secondary | ICD-10-CM | POA: Diagnosis not present

## 2021-05-19 ENCOUNTER — Encounter: Payer: Self-pay | Admitting: Internal Medicine

## 2021-05-19 ENCOUNTER — Telehealth: Payer: Self-pay | Admitting: Internal Medicine

## 2021-05-19 NOTE — Telephone Encounter (Signed)
Patient called in stating she was returning someone call ... patient states that there wasn't a message left so she in not sure who called however knew it was our office... I informed patient that I would definitely send message  So that she could get a return call

## 2021-05-19 NOTE — Telephone Encounter (Signed)
Left a message for the pt to return my call.  

## 2021-05-20 ENCOUNTER — Telehealth (INDEPENDENT_AMBULATORY_CARE_PROVIDER_SITE_OTHER): Payer: BC Managed Care – PPO | Admitting: Family Medicine

## 2021-05-20 ENCOUNTER — Encounter: Payer: Self-pay | Admitting: Family Medicine

## 2021-05-20 VITALS — Temp 97.0°F | Wt 215.0 lb

## 2021-05-20 DIAGNOSIS — R0981 Nasal congestion: Secondary | ICD-10-CM | POA: Diagnosis not present

## 2021-05-20 DIAGNOSIS — U071 COVID-19: Secondary | ICD-10-CM | POA: Diagnosis not present

## 2021-05-20 DIAGNOSIS — R519 Headache, unspecified: Secondary | ICD-10-CM | POA: Diagnosis not present

## 2021-05-20 MED ORDER — DOXYCYCLINE HYCLATE 100 MG PO TABS: 100.0000 mg | ORAL_TABLET | Freq: Two times a day (BID) | ORAL | 0 refills | Status: DC

## 2021-05-20 NOTE — Patient Instructions (Signed)
-  I sent the medication(s) we discussed to your pharmacy: Meds ordered this encounter  Medications   doxycycline (VIBRA-TABS) 100 MG tablet    Sig: Take 1 tablet (100 mg total) by mouth 2 (two) times daily.    Dispense:  20 tablet    Refill:  0   Nasal saline sinus rinse twice daily  Can try elimination of dairy products when sick as well  I hope you are feeling better soon!  Seek in person care promptly if your symptoms worsen, new concerns arise or you are not improving with treatment.  It was nice to meet you today. I help [place] out with telemedicine visits on Tuesdays and Thursdays and am happy to help if you need a virtual follow up visit on those days. Otherwise, if you have any concerns or questions following this visit please schedule a follow up visit with your Primary Care office or seek care at a local urgent care clinic to avoid delays in care

## 2021-05-20 NOTE — Progress Notes (Signed)
Virtual Visit via Video Note  I connected with Sheri [name]  on 05/20/21 at 11:20 AM EST by a video enabled telemedicine application and verified that I am speaking with the correct person using two identifiers.  Location patient: [place] Location provider:work or home office Persons participating in the virtual visit: patient, provider  I discussed the limitations and requested verbal permission for telemedicine visit. The patient expressed understanding and agreed to proceed.   HPI:  Acute telemedicine visit for a "sinus infection": -Onset: 3 weeks ago with Covid -Symptoms include:did not have bad symptoms initially, bow developing green sinus mucus, sinus discomfort, PND with sore throat and cough -Denies: fevers, CP, SOB, malaise -drinking lots of fluids -Pertinent past medical history: see below -denies liver disease or any chance of pregnancy -reports is not breastfeeding -Pertinent medication allergies: Allergies  Allergen Reactions   Bupropion Other (See Comments)  -COVID-19 vaccine status:  Immunization History  Administered Date(s) Administered   Influenza,inj,Quad PF,6+ Mos 02/13/2015, 04/27/2016, 02/15/2017   Influenza-Unspecified 01/18/2014, 01/18/2018, 02/01/2019, 01/14/2020   PFIZER(Purple Top)SARS-COV-2 Vaccination 06/17/2019, 07/08/2019, 01/14/2020, 02/18/2021   Tdap 07/26/2017    ROS: See pertinent positives and negatives per HPI.  Past Medical History:  Diagnosis Date   Alcohol addiction ([place])    Anxiety    Bipolar depression ([place])    under psych rx    Depression    Drug addiction in remission [facility])    heroin   Enteritis due to Norovirus 05/18/2016   Hx of pulmonary embolus 02/13/2015   Hyperlipidemia    Hypokalemia 05/17/2016   IUD (intrauterine device) in place 02/13/2015   Mild mitral regurgitation    Mild tricuspid regurgitation    Mitral regurgitation 06/13/2018   Near syncope 05/16/2016   a. felt due to norovirus.   Nonallopathic lesion of cervical  region 09/01/2018   Nonallopathic lesion of rib cage 09/01/2018   Obesity    Orthostatic hypotension 05/18/2016   PCOS (polycystic ovarian syndrome) 02/13/2015   Pulmonary embolism ([place]) 2011   neg heme evaluation  felt to be from ocps    Scapular dyskinesis 09/01/2018   Smoker 12/30/2016   Syncope and collapse 05/16/2016   Thoracic outlet syndrome of left thoracic outlet 02/15/2018   Thoracic radiculopathy due to degenerative joint disease of spine 10/13/2016   CT and XR show at least 3 levels of DDD of thoracic spine  I suspect this is related to both the scoliosis and the history of dance    Past Surgical History:  Procedure Laterality Date   NO PAST SURGERIES       Current Outpatient Medications:    aspirin EC 81 MG tablet, Take 81 mg by mouth daily. Swallow whole., Disp: , Rfl:    busPIRone (BUSPAR) 15 MG tablet, Take 15 mg by mouth 2 (two) times daily., Disp: , Rfl:    cetirizine (ZYRTEC) 10 MG tablet, Take 10 mg by mouth daily., Disp: , Rfl:    doxycycline (VIBRA-TABS) 100 MG tablet, Take 1 tablet (100 mg total) by mouth 2 (two) times daily., Disp: 20 tablet, Rfl: 0   sertraline (ZOLOFT) 100 MG tablet, Take 100 mg by mouth daily., Disp: , Rfl:    SUMAtriptan (IMITREX) 25 MG tablet, TAKE 1 TABLET BY MOUTH AT START OF MIGRAINE., Disp: 10 tablet, Rfl: 1  EXAM:  VITALS per patient if applicable:  GENERAL: alert, oriented, appears well and in no acute distress  HEENT: atraumatic, conjunttiva clear, no obvious abnormalities on inspection of external nose and ears  NECK:  normal movements of the head and neck  LUNGS: on inspection no signs of respiratory distress, breathing rate appears normal, no obvious gross SOB, gasping or wheezing  CV: no obvious cyanosis  MS: moves all visible extremities without noticeable abnormality  PSYCH/NEURO: pleasant and cooperative, no obvious depression or anxiety, speech and thought processing grossly intact  ASSESSMENT AND PLAN:  Discussed  the following assessment and plan:  Nasal congestion  Facial discomfort  COVID-19  -we discussed possible serious and likely etiologies, options for evaluation and workup, limitations of telemedicine visit vs in person visit, treatment, treatment risks and precautions. Pt is agreeable to treatment via telemedicine at this moment. Query prolonged symptoms with covid (not uncommon), developing sinusitis, vs other. She has opted to try nasal saline sinus rinses and if worsening or not improving initiation of doxy 100mg  bid x 7-10 days.  Advised to seek prompt virtual visit or in person care if worsening, new symptoms arise, or if is not improving with treatment as expected per our conversation of expected course.  I discussed the assessment and treatment plan with the patient. The patient was provided an opportunity to ask questions and all were answered. The patient agreed with the plan and demonstrated an understanding of the instructions.     Lucretia Kern, DO

## 2021-05-20 NOTE — Telephone Encounter (Signed)
I ATC pt yesterday in regards to previous message involving sinus infection. Pt has appt with Dr. Maudie Mercury today.

## 2021-05-29 ENCOUNTER — Encounter: Payer: Self-pay | Admitting: Family Medicine

## 2021-06-02 NOTE — Progress Notes (Signed)
[name] [address] [place] [address] Phone: [phone] Subjective:   Sheri [name], am serving as a scribe for Dr. Antoine Primas. This visit occurred during the SARS-CoV-2 public health emergency.  Safety protocols were in place, including screening questions prior to the visit, additional usage of staff PPE, and extensive cleaning of exam room while observing appropriate contact time as indicated for disinfecting solutions.   I'm seeing this patient by the request  of:  Panosh, Neta Mends, MD  CC: Arm and back pain  [id number]  10/17/2020 Patient given injection and tolerated the procedure well, discussed icing regimen and home exercises, we discussed that we would like to avoid doing too many of these in the long-term but I do expect patient to do relatively well.  Continuing to have difficulty we may need to consider the possibility of nerve conduction study.  Follow-up with me again in 8 weeks but patient may be switching insurances and does not know if we will be covered.  Continue to work on the scapular dyskinesis.  Discussed which activities to do which wants to avoid.  Increase activity slowly.  Follow-up with me again in 6 to 8 weeks  Updated 06/03/2021 Sheri [name] is a 31 y.o. female coming in with complaint of arm pain.  Patient was having more pain going down the right arm.  Previously did have a carpal tunnel but this seems to be different patient states.  This is also been associated with mild increase in headaches with some facial tingling and light sensitivity.  Patient has been trying stretching, muscle relaxers, pillows as well as massage and heat with no significant benefit.  Recently treated for a sinus infection with doxycycline at the end of January.  Possible OMT. Pain is radiating down arm and into hand. Getting numbness and tingling mostly at night. Changing positions helps..  Patient did have laboratory work-up 11  months ago that did show a hypercalcemia     Past Medical History:  Diagnosis Date   Alcohol addiction ([facility])    Anxiety    Bipolar depression ([facility])    under psych rx    Depression    Drug addiction in remission [facility])    heroin   Enteritis due to Norovirus 05/18/2016   Hx of pulmonary embolus 02/13/2015   Hyperlipidemia    Hypokalemia 05/17/2016   IUD (intrauterine device) in place 02/13/2015   Mild mitral regurgitation    Mild tricuspid regurgitation    Mitral regurgitation 06/13/2018   Near syncope 05/16/2016   a. felt due to norovirus.   Nonallopathic lesion of cervical region 09/01/2018   Nonallopathic lesion of rib cage 09/01/2018   Obesity    Orthostatic hypotension 05/18/2016   PCOS (polycystic ovarian syndrome) 02/13/2015   Pulmonary embolism ([facility]) 2011   neg heme evaluation  felt to be from ocps    Scapular dyskinesis 09/01/2018   Smoker 12/30/2016   Syncope and collapse 05/16/2016   Thoracic outlet syndrome of left thoracic outlet 02/15/2018   Thoracic radiculopathy due to degenerative joint disease of spine 10/13/2016   CT and XR show at least 3 levels of DDD of thoracic spine  I suspect this is related to both the scoliosis and the history of dance   Past Surgical History:  Procedure Laterality Date   NO PAST SURGERIES     Social History   Socioeconomic History   Marital status: Married    Spouse name: Not on file  Number of children: Not on file   Years of education: Not on file   Highest education level: Not on file  Occupational History   Occupation: nanny  Tobacco Use   Smoking status: Former    Types: Cigarettes    Quit date: 12/20/2015    Years since quitting: 5.4   Smokeless tobacco: Never  Vaping Use   Vaping Use: Never used  Substance and Sexual Activity   Alcohol use: No    Alcohol/week: 0.0 standard drinks   Drug use: No   Sexual activity: Not on file    Comment: previous IUD  Other Topics Concern   Not on file  Social History Narrative    5-10 hours of sleep per night   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)   Recovering from drug and alcohol addiction   Joined [employer]   Lives with her parents   2 dogs in the home      [place] 3 years  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  Current Outpatient Medications (Analgesics):    aspirin EC 81 MG tablet, Take 81 mg by mouth daily. Swallow whole.   SUMAtriptan (IMITREX) 25 MG tablet, TAKE 1 TABLET BY MOUTH AT START OF MIGRAINE.   Current Outpatient Medications (Other):    busPIRone (BUSPAR) 15 MG tablet, Take 15 mg by mouth 2 (two) times daily.   doxycycline (VIBRA-TABS) 100 MG tablet, Take 1 tablet (100 mg total) by mouth 2 (two) times daily.   sertraline (ZOLOFT) 100 MG tablet, Take 100 mg by mouth daily.   Reviewed prior external information including notes and imaging from  primary care provider As well as notes that were available from care everywhere and other healthcare systems.  Past medical history, social, surgical and family history all reviewed in electronic medical record.  No pertanent information unless stated regarding to the chief complaint.   Review of Systems:  No headache, visual changes, nausea, vomiting, diarrhea, constipation, dizziness, abdominal pain, skin rash, fevers, chills, night sweats, weight loss, swollen lymph nodes, body aches, joint swelling, chest pain, shortness of breath, mood  changes. POSITIVE muscle aches positive headache  Objective  Last menstrual period 05/06/2021, currently breastfeeding.   General: No apparent distress alert and oriented x3 mood and affect normal, dressed appropriately.  HEENT: Pupils equal, extraocular movements intact patient does have swelling in the maxillary sinus with positive pain with percussion. Respiratory: Patient's speak in full sentences and does not appear short of breath  Cardiovascular: No lower extremity edema, non tender, no erythema  Gait normal with good balance and coordination.  MSK: Significant tightness noted in the parascapular region right greater than left.  Patient does have some numbness noted as well.  No significant weakness of the hands noted.  Patient has mild scapular dyskinesis noted.  Patient's right shoulder does have some mild protraction noted.  Neck exam does have some loss of lordosis.   Osteopathic findings C2 flexed rotated and side bent right C4 flexed rotated and side bent left C6 flexed rotated and side bent left T3 extended rotated and side bent right inhaled third rib T9 extended rotated and side bent left L2 flexed rotated and side bent right Sacrum right on right    Impression and Recommendations:     The above documentation has been reviewed and is accurate and complete Sheri Saa, DO

## 2021-06-03 ENCOUNTER — Other Ambulatory Visit: Payer: Self-pay

## 2021-06-03 ENCOUNTER — Ambulatory Visit (INDEPENDENT_AMBULATORY_CARE_PROVIDER_SITE_OTHER): Payer: BC Managed Care – PPO | Admitting: Family Medicine

## 2021-06-03 VITALS — BP 118/86 | HR 100 | Ht 63.0 in | Wt 214.0 lb

## 2021-06-03 DIAGNOSIS — M9904 Segmental and somatic dysfunction of sacral region: Secondary | ICD-10-CM | POA: Diagnosis not present

## 2021-06-03 DIAGNOSIS — G2589 Other specified extrapyramidal and movement disorders: Secondary | ICD-10-CM

## 2021-06-03 DIAGNOSIS — M9903 Segmental and somatic dysfunction of lumbar region: Secondary | ICD-10-CM

## 2021-06-03 DIAGNOSIS — M9908 Segmental and somatic dysfunction of rib cage: Secondary | ICD-10-CM | POA: Diagnosis not present

## 2021-06-03 DIAGNOSIS — J0100 Acute maxillary sinusitis, unspecified: Secondary | ICD-10-CM

## 2021-06-03 DIAGNOSIS — M9901 Segmental and somatic dysfunction of cervical region: Secondary | ICD-10-CM

## 2021-06-03 DIAGNOSIS — M9902 Segmental and somatic dysfunction of thoracic region: Secondary | ICD-10-CM

## 2021-06-03 DIAGNOSIS — M4724 Other spondylosis with radiculopathy, thoracic region: Secondary | ICD-10-CM

## 2021-06-03 MED ORDER — PREDNISONE 20 MG PO TABS: 20.0000 mg | ORAL_TABLET | Freq: Every day | ORAL | 0 refills | Status: DC

## 2021-06-03 MED ORDER — AMOXICILLIN-POT CLAVULANATE 875-125 MG PO TABS: 1.0000 | ORAL_TABLET | Freq: Two times a day (BID) | ORAL | 0 refills | Status: DC

## 2021-06-03 NOTE — Patient Instructions (Addendum)
Prescriptions orders Do prescribed exercises at least 3x a week See you again in 4 weeks

## 2021-06-03 NOTE — Assessment & Plan Note (Signed)
Known arthritic changes.  Does have some scapular dyskinesis that I think is also contributing.  Has been sometime since we have seen patient.  I do believe that patient's neck pain and headache does seem to be more secondary to a sinus infection.  He was seen previously and had doxycycline but did not finish it secondary to GI discomfort.  Was given Augmentin and we will see how patient responds.  Follow-up with me again in 4 to 8 weeks

## 2021-06-03 NOTE — Assessment & Plan Note (Signed)
Augmentin given.  Has had recurrent previously.  Patient had difficulty with the doxycycline that was given by another provider recently.

## 2021-06-26 NOTE — Progress Notes (Unsigned)
Cardiology Office Note:    Date:  06/26/2021   ID:  Sheri [name], DOB Sep 01, 1990, MRN [id number]  PCP:  Burnis Medin, MD   [facility] Cardiologist:  Ena Dawley, MD { Click to update primary MD,subspecialty MD or APP then REFRESH:1}    Referring MD: Burnis Medin, MD   No chief complaint on file. ***  History of Present Illness:    Sheri [name] is a 31 y.o. female with a hx of ***  Past Medical History:  Diagnosis Date   Alcohol addiction ([place])    Anxiety    Bipolar depression ([place])    under psych rx    Depression    Drug addiction in remission [facility])    heroin   Enteritis due to Norovirus 05/18/2016   Hx of pulmonary embolus 02/13/2015   Hyperlipidemia    Hypokalemia 05/17/2016   IUD (intrauterine device) in place 02/13/2015   Mild mitral regurgitation    Mild tricuspid regurgitation    Mitral regurgitation 06/13/2018   Near syncope 05/16/2016   a. felt due to norovirus.   Nonallopathic lesion of cervical region 09/01/2018   Nonallopathic lesion of rib cage 09/01/2018   Obesity    Orthostatic hypotension 05/18/2016   PCOS (polycystic ovarian syndrome) 02/13/2015   Pulmonary embolism ([place]) 2011   neg heme evaluation  felt to be from ocps    Scapular dyskinesis 09/01/2018   Smoker 12/30/2016   Syncope and collapse 05/16/2016   Thoracic outlet syndrome of left thoracic outlet 02/15/2018   Thoracic radiculopathy due to degenerative joint disease of spine 10/13/2016   CT and XR show at least 3 levels of DDD of thoracic spine  I suspect this is related to both the scoliosis and the history of dance    Past Surgical History:  Procedure Laterality Date   NO PAST SURGERIES      Current Medications: No outpatient medications have been marked as taking for the 07/04/21 encounter (Appointment) with Freada Bergeron, MD.     Allergies:   Bupropion   Social History   Socioeconomic History   Marital status: Married    Spouse name:  Not on file   Number of children: Not on file   Years of education: Not on file   Highest education level: Not on file  Occupational History   Occupation: nanny  Tobacco Use   Smoking status: Former    Types: Cigarettes    Quit date: 12/20/2015    Years since quitting: 5.5   Smokeless tobacco: Never  Vaping Use   Vaping Use: Never used  Substance and Sexual Activity   Alcohol use: No    Alcohol/week: 0.0 standard drinks   Drug use: No   Sexual activity: Not on file    Comment: previous IUD  Other Topics Concern   Not on file  Social History Narrative   5-10 hours of sleep per night   Works part time as a Surveyor, 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  ROS:   Please see the history of present illness.    *** All other systems reviewed and are negative.  EKGs/Labs/Other Studies Reviewed:    The following studies were reviewed today: ***  EKG:  EKG is *** ordered today.  The ekg ordered today demonstrates ***  Recent Labs: No results found for requested labs within last 8760 hours.  Recent Lipid Panel    Component Value Date/Time   CHOL 235 (H) 06/12/2020 1045   TRIG 139.0 06/12/2020 1045   HDL 66.40 06/12/2020 1045   CHOLHDL 4 06/12/2020 1045   VLDL 27.8 06/12/2020 1045   LDLCALC 141 (H) 06/12/2020 1045   LDLDIRECT 150.0 02/13/2015 1449     Risk Assessment/Calculations:   {Does this patient have ATRIAL  FIBRILLATION?[phone]}       Physical Exam:    VS:  There were no vitals taken for this visit.    Wt Readings from Last 3 Encounters:  06/03/21 214 lb (97.1 kg)  05/20/21 215 lb (97.5 kg)  02/06/21 218 lb (98.9 kg)     GEN: *** Well nourished, well developed in no acute distress HEENT: Normal NECK: No JVD; No carotid bruits LYMPHATICS: No lymphadenopathy CARDIAC: ***RRR, no murmurs, rubs, gallops RESPIRATORY:  Clear to auscultation without rales, wheezing or rhonchi  ABDOMEN: Soft, non-tender, non-distended MUSCULOSKELETAL:  No edema; No deformity  SKIN: Warm and dry NEUROLOGIC:  Alert and oriented x 3 PSYCHIATRIC:  Normal affect   ASSESSMENT:    No diagnosis found. PLAN:    In order of problems listed above:  ***      {Are you ordering a CV Procedure (e.g. stress test, cath, DCCV, TEE, etc)?   Press F2        :[id number]    Medication Adjustments/Labs and Tests Ordered: Current medicines are reviewed at length with the patient today.  Concerns regarding medicines are outlined above.  No orders of the defined types were placed in this encounter.  No orders of the defined types were placed in this encounter.   There are no Patient Instructions on file for this visit.   Signed, Freada Bergeron, MD  06/26/2021 3:40 PM    [facility]

## 2021-06-27 NOTE — Progress Notes (Unsigned)
[place] Phone: [phone] Subjective:    I'm seeing this patient by the request  of:  Panosh, Standley Brooking, MD  CC:   [id number]  Sheri [name] is a 31 y.o. female coming in with complaint of back and neck pain. OMT 06/03/2021. Patient states   Medications patient has been prescribed: Augumentin, Prednisone  Taking:         Reviewed prior external information including notes and imaging from previsou exam, outside providers and external EMR if available.   As well as notes that were available from care everywhere and other healthcare systems.  Past medical history, social, surgical and family history all reviewed in electronic medical record.  No pertanent information unless stated regarding to the chief complaint.   Past Medical History:  Diagnosis Date   Alcohol addiction ([place])    Anxiety    Bipolar depression ([place])    under psych rx    Depression    Drug addiction in remission [facility])    heroin   Enteritis due to Norovirus 05/18/2016   Hx of pulmonary embolus 02/13/2015   Hyperlipidemia    Hypokalemia 05/17/2016   IUD (intrauterine device) in place 02/13/2015   Mild mitral regurgitation    Mild tricuspid regurgitation    Mitral regurgitation 06/13/2018   Near syncope 05/16/2016   a. felt due to norovirus.   Nonallopathic lesion of cervical region 09/01/2018   Nonallopathic lesion of rib cage 09/01/2018   Obesity    Orthostatic hypotension 05/18/2016   PCOS (polycystic ovarian syndrome) 02/13/2015   Pulmonary embolism ([place]) 2011   neg heme evaluation  felt to be from ocps    Scapular dyskinesis 09/01/2018   Smoker 12/30/2016   Syncope and collapse 05/16/2016   Thoracic outlet syndrome of left thoracic outlet 02/15/2018   Thoracic radiculopathy due to degenerative joint disease of spine 10/13/2016   CT and XR show at least 3 levels of DDD of thoracic spine  I suspect this is related to  both the scoliosis and the history of dance    Allergies  Allergen Reactions   Bupropion Other (See Comments)     Review of Systems:  No headache, visual changes, nausea, vomiting, diarrhea, constipation, dizziness, abdominal pain, skin rash, fevers, chills, night sweats, weight loss, swollen lymph nodes, body aches, joint swelling, chest pain, shortness of breath, mood changes. POSITIVE muscle aches  Objective  currently breastfeeding.   General: No apparent distress alert and oriented x3 mood and affect normal, dressed appropriately.  HEENT: Pupils equal, extraocular movements intact  Respiratory: Patient's speak in full sentences and does not appear short of breath  Cardiovascular: No lower extremity edema, non tender, no erythema  Neuro: Cranial nerves II through XII are intact, neurovascularly intact in all extremities with 2+ DTRs and 2+ pulses.  Gait normal with good balance and coordination.  MSK:  Non tender with full range of motion and good stability and symmetric strength and tone of shoulders, elbows, wrist, hip, knee and ankles bilaterally.  Back - Normal skin, Spine with normal alignment and no deformity.  No tenderness to vertebral process palpation.  Paraspinous muscles are not tender and without spasm.   Range of motion is full at neck and lumbar sacral regions  Osteopathic findings  C2 flexed rotated and side bent right C6 flexed rotated and side bent left T3 extended rotated and side bent right inhaled rib T9 extended rotated and side bent left  L2 flexed rotated and side bent right Sacrum right on right       Assessment and Plan:    Nonallopathic problems  Decision today to treat with OMT was based on Physical Exam  After verbal consent patient was treated with HVLA, ME, FPR techniques in cervical, rib, thoracic, lumbar, and sacral  areas  Patient tolerated the procedure well with improvement in symptoms  Patient given exercises, stretches and  lifestyle modifications  See medications in patient instructions if given  Patient will follow up in 4-8 weeks      The above documentation has been reviewed and is accurate and complete Jacqualin Combes       Note: This dictation was prepared with Dragon dictation along with smaller phrase technology. Any transcriptional errors that result from this process are unintentional.

## 2021-07-01 ENCOUNTER — Other Ambulatory Visit: Payer: Self-pay

## 2021-07-01 ENCOUNTER — Ambulatory Visit (INDEPENDENT_AMBULATORY_CARE_PROVIDER_SITE_OTHER): Payer: BC Managed Care – PPO | Admitting: Family Medicine

## 2021-07-01 VITALS — BP 110/62 | HR 86 | Ht 63.0 in | Wt 217.0 lb

## 2021-07-01 DIAGNOSIS — M9901 Segmental and somatic dysfunction of cervical region: Secondary | ICD-10-CM

## 2021-07-01 DIAGNOSIS — M9903 Segmental and somatic dysfunction of lumbar region: Secondary | ICD-10-CM

## 2021-07-01 DIAGNOSIS — M546 Pain in thoracic spine: Secondary | ICD-10-CM | POA: Diagnosis not present

## 2021-07-01 DIAGNOSIS — M9902 Segmental and somatic dysfunction of thoracic region: Secondary | ICD-10-CM

## 2021-07-01 DIAGNOSIS — M9904 Segmental and somatic dysfunction of sacral region: Secondary | ICD-10-CM

## 2021-07-01 DIAGNOSIS — M9908 Segmental and somatic dysfunction of rib cage: Secondary | ICD-10-CM

## 2021-07-01 NOTE — Assessment & Plan Note (Signed)
Continued increased pain more in the mid back at the moment.  Discussed icing regimen and home exercises.  Discussed which activities to do and which ones to avoid.  Increase activity slowly.  Follow-up with me again in 6 to 8 weeks. ?

## 2021-07-01 NOTE — Patient Instructions (Addendum)
Good to see you  ?Try to find little time for yourself  ?[email or web address] ?Follow up in 4-6 weeks  ?

## 2021-07-04 ENCOUNTER — Encounter: Payer: Self-pay | Admitting: Cardiology

## 2021-07-04 ENCOUNTER — Other Ambulatory Visit: Payer: Self-pay

## 2021-07-04 ENCOUNTER — Ambulatory Visit: Payer: BC Managed Care – PPO | Admitting: Cardiology

## 2021-07-04 VITALS — BP 102/72 | HR 86 | Ht 63.0 in | Wt 220.9 lb

## 2021-07-04 DIAGNOSIS — E669 Obesity, unspecified: Secondary | ICD-10-CM

## 2021-07-04 DIAGNOSIS — Z86711 Personal history of pulmonary embolism: Secondary | ICD-10-CM

## 2021-07-04 DIAGNOSIS — R002 Palpitations: Secondary | ICD-10-CM

## 2021-07-04 DIAGNOSIS — E782 Mixed hyperlipidemia: Secondary | ICD-10-CM | POA: Diagnosis not present

## 2021-07-04 NOTE — Patient Instructions (Signed)
Medication Instructions:  ? ?Your physician recommends that you continue on your current medications as directed. Please refer to the Current Medication list given to you today. ? ?*If you need a refill on your cardiac medications before your next appointment, please call your pharmacy* ? ? ? ?Follow-Up: ?At [facility], you and your health needs are our priority.  As part of our continuing mission to provide you with exceptional heart care, we have created designated Provider Care Teams.  These Care Teams include your primary Cardiologist (physician) and Advanced Practice Providers (APPs -  Physician Assistants and Nurse Practitioners) who all work together to provide you with the care you need, when you need it. ? ?We recommend signing up for the patient portal called "MyChart".  Sign up information is provided on this After Visit Summary.  MyChart is used to connect with patients for Virtual Visits (Telemedicine).  Patients are able to view lab/test results, encounter notes, upcoming appointments, etc.  Non-urgent messages can be sent to your provider as well.   ?To learn more about what you can do with MyChart, go to [email or web address].   ? ?Your next appointment:   ?1 year(s) ? ?The format for your next appointment:   ?In Person ? ?Provider:   ?Dr. Pemberton  ? ?

## 2021-07-07 IMAGING — US US ABDOMEN COMPLETE
1 series · 14 of 25 positions shown · non-contrast
Comparison: Ultrasound 07/20/2016

CLINICAL DATA: Postprandial upper abdominal pain

EXAM:
ABDOMEN ULTRASOUND COMPLETE

[Series 1: us abdomen complete · 0.28mm/px · 14 of 90 slices shown]
[im 1/90]
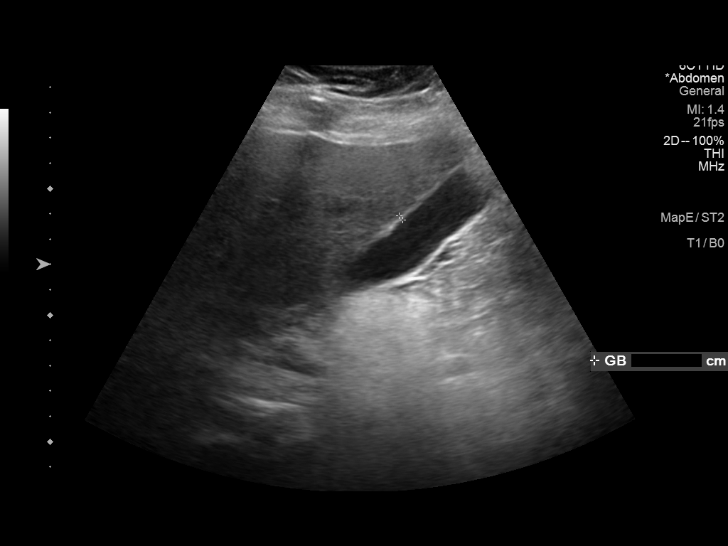
[im 8/90]
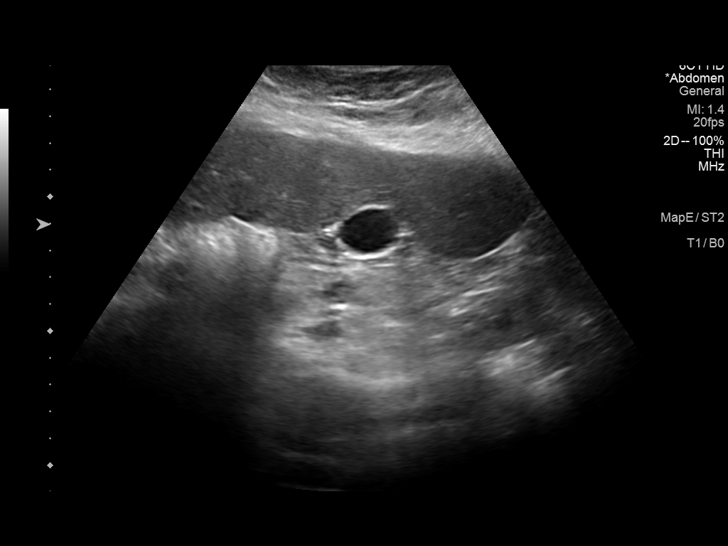
[im 15/90]
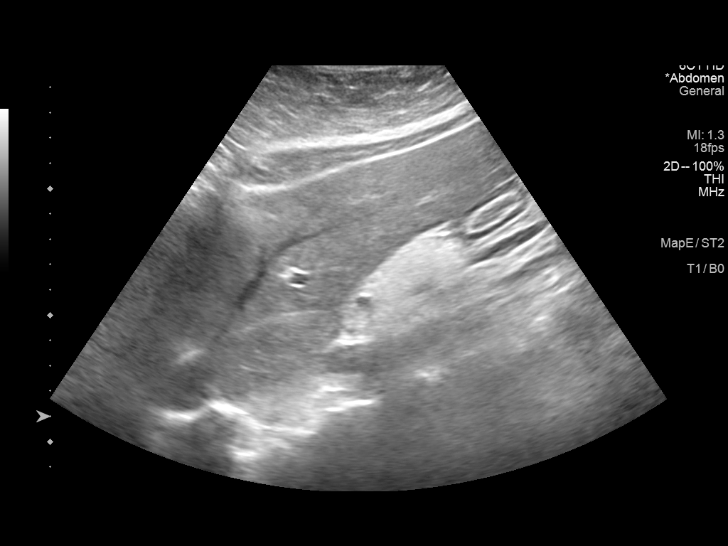
[im 23/90]
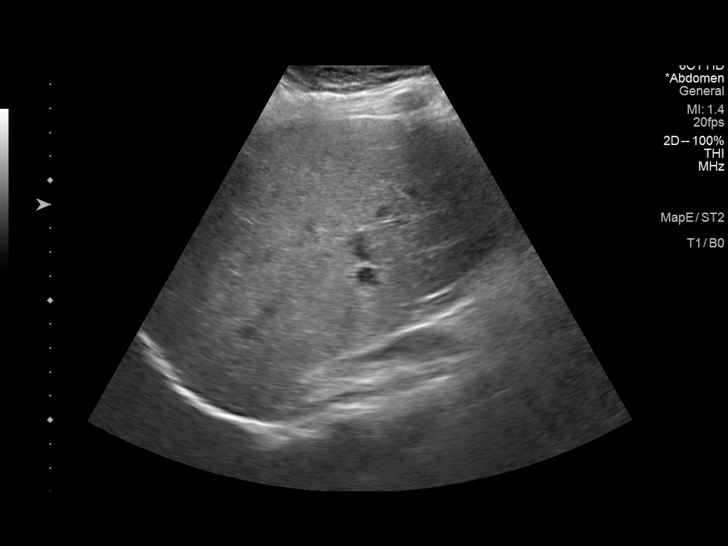
[im 30/90]
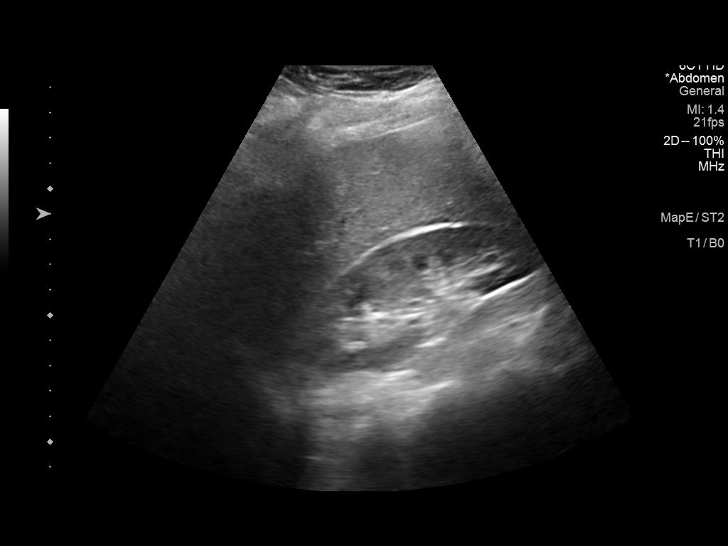
[im 34/90]
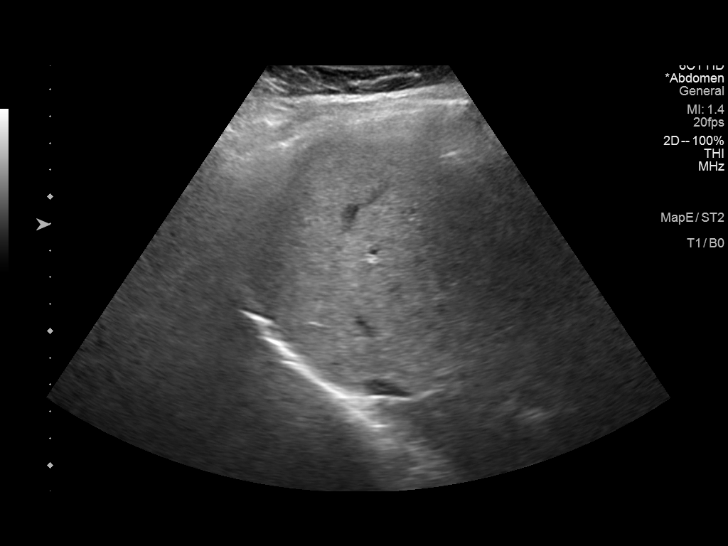
[im 41/90]
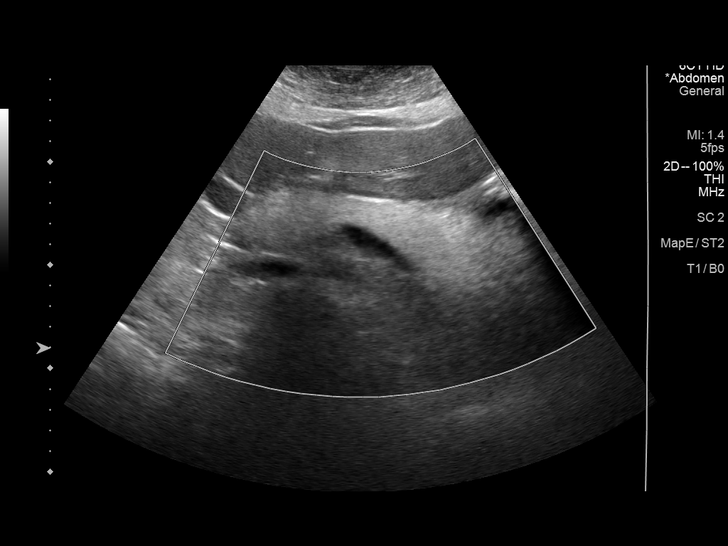
[im 49/90]
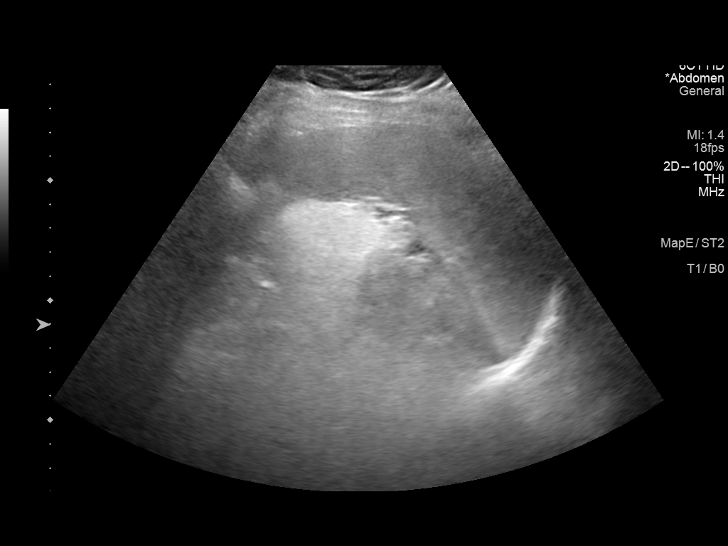
[im 56/90]
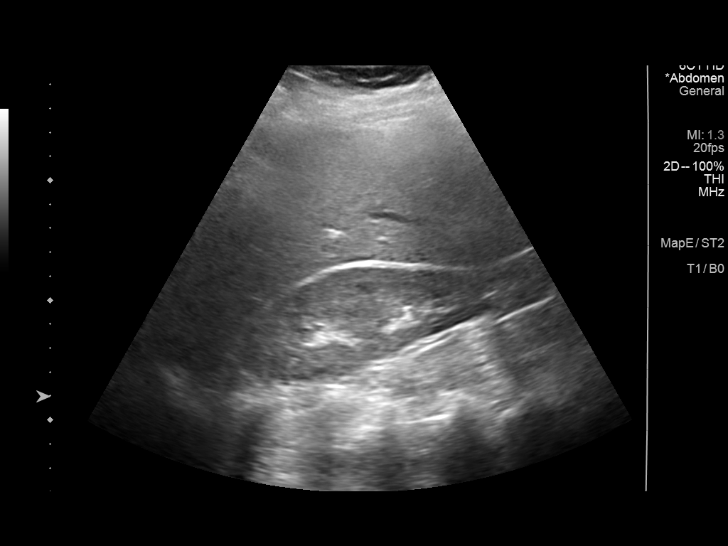
[im 60/90]
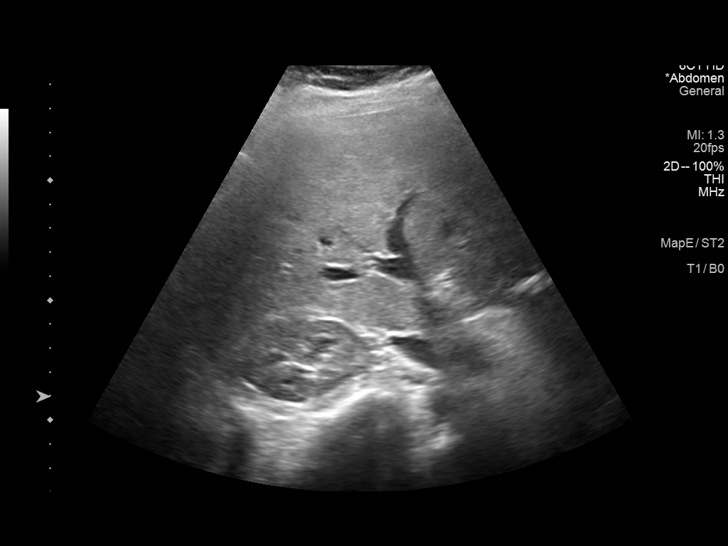
[im 67/90]
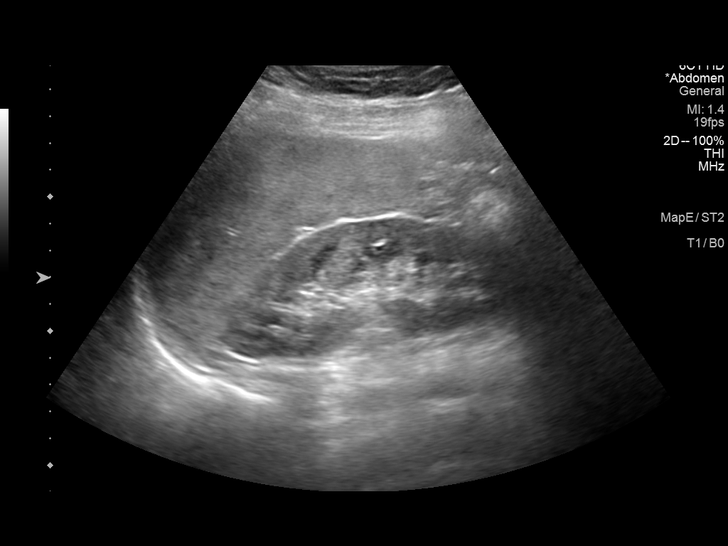
[im 75/90]
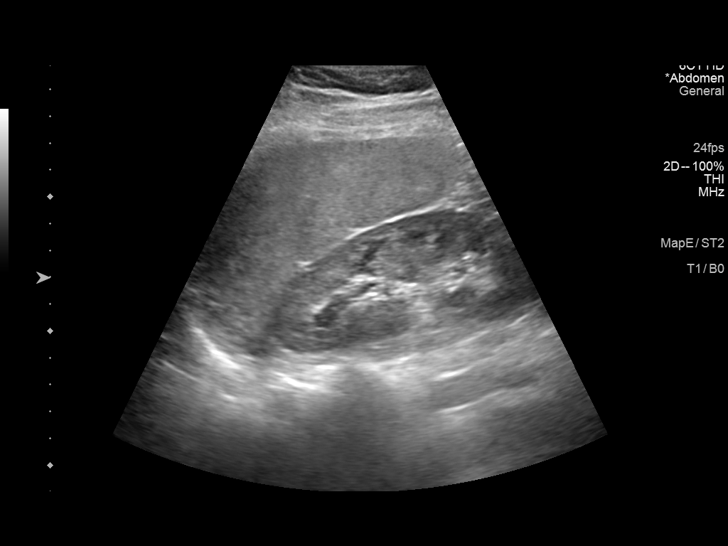
[im 82/90]
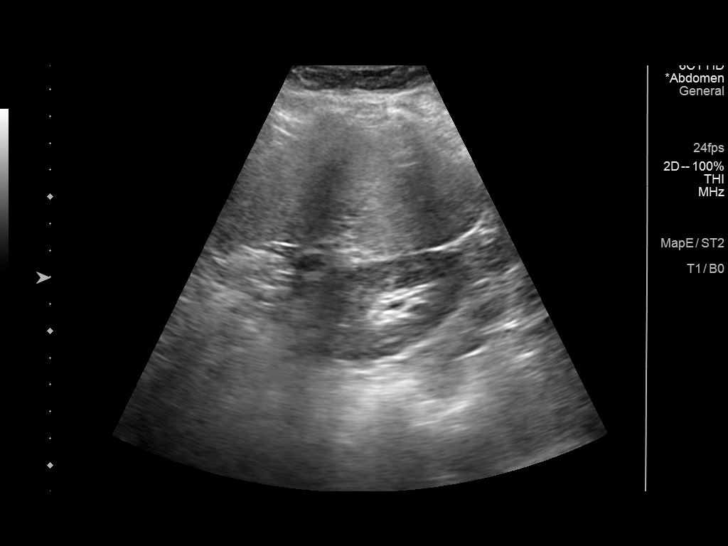
[im 90/90]
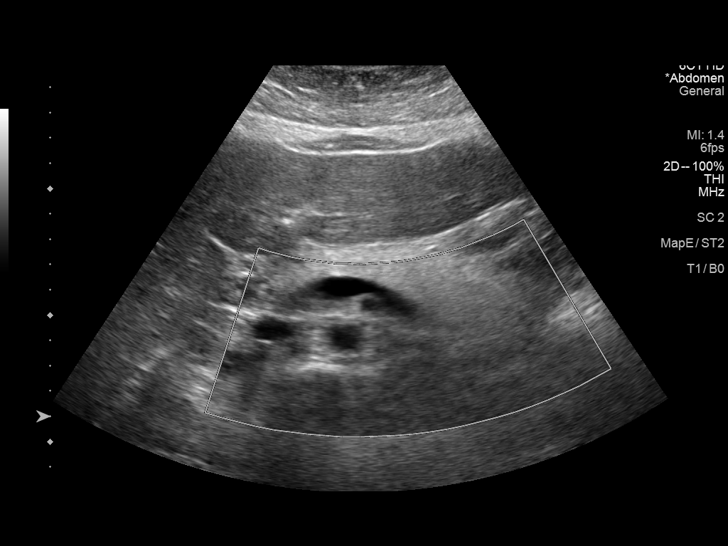

[14 of 25 positions shown; findings below may reference images not displayed]

FINDINGS: Gallbladder: No gallstones or wall thickening visualized. No
sonographic Murphy sign noted by sonographer.

Common bile duct: Diameter: 4.2 mm

Liver: Liver may be slightly echogenic. No focal hepatic
abnormality. Portal vein is patent on color Doppler imaging with
normal direction of blood flow towards the liver.

IVC: No abnormality visualized.

Pancreas: No ductal dilatation. Tail is incompletely visualized due
to bowel gas. No fluid collections.

Spleen: Size and appearance within normal limits.

Right Kidney: Length: 12.2 cm. Echogenicity within normal limits. No
mass or hydronephrosis visualized.

Left Kidney: Length: 11.7 cm. Echogenicity within normal limits. No
mass or hydronephrosis visualized.

Abdominal aorta: No aneurysm visualized.

Other findings: None.
IMPRESSION: Liver may be slightly echogenic as may be seen with steatosis.
Negative for gallstones or other significant abnormality.

## 2021-07-28 ENCOUNTER — Telehealth (INDEPENDENT_AMBULATORY_CARE_PROVIDER_SITE_OTHER): Payer: BC Managed Care – PPO | Admitting: Family Medicine

## 2021-07-28 DIAGNOSIS — R062 Wheezing: Secondary | ICD-10-CM

## 2021-07-28 DIAGNOSIS — R059 Cough, unspecified: Secondary | ICD-10-CM

## 2021-07-28 MED ORDER — ALBUTEROL SULFATE HFA 108 (90 BASE) MCG/ACT IN AERS: 2.0000 | INHALATION_SPRAY | Freq: Four times a day (QID) | RESPIRATORY_TRACT | 0 refills | Status: DC | PRN

## 2021-07-28 MED ORDER — BENZONATATE 100 MG PO CAPS: ORAL_CAPSULE | ORAL | 0 refills | Status: DC

## 2021-07-28 NOTE — Progress Notes (Signed)
Virtual Visit via Video Note ? ?I connected withNAME@ ? on 07/28/21 at 11:00 AM EDT by a video enabled telemedicine application and verified that I am speaking with the correct person using two identifiers. ? Location patient: [place] ?Location provider:work or home office ?Persons participating in the virtual visit: patient, provider ? ?I discussed the limitations and requested verbal permission for telemedicine visit. The patient expressed understanding and agreed to proceed. ? ? ?HPI: ? ?Acute telemedicine visit for cough and congestion: ?-Onset: 3 days ago ?-reports covid test was negative ?-Symptoms include: cough, feels a little wheezy (reports this is normal for her when she gets a cough) ?-she wants a refill on her albuterol as has used this in the past ?-Denies:fever, CP, SOB, thick mucus ?-Has tried: musinex ?-Pertinent past medical history: see below ?-Pertinent medication allergies: ?Allergies  ?Allergen Reactions  ? Bupropion Other (See Comments)  ? ?-COVID-19 vaccine status:  ?Immunization History  ?Administered Date(s) Administered  ? Influenza,inj,Quad PF,6+ Mos 02/13/2015, 04/27/2016, 02/15/2017  ? Influenza-Unspecified 01/18/2014, 01/18/2018, 02/01/2019, 01/14/2020  ? PFIZER(Purple Top)SARS-COV-2 Vaccination 06/17/2019, 07/08/2019, 01/14/2020, 02/18/2021  ? Tdap 07/26/2017  ? ? ? ?ROS: See pertinent positives and negatives per HPI. ? ?Past Medical History:  ?Diagnosis Date  ? Alcohol addiction (HCC)   ? Anxiety   ? Bipolar depression (HCC)   ? under psych rx   ? Depression   ? Drug addiction in remission [facility])   ? heroin  ? Enteritis due to Norovirus 05/18/2016  ? Hx of pulmonary embolus 02/13/2015  ? Hyperlipidemia   ? Hypokalemia 05/17/2016  ? IUD (intrauterine device) in place 02/13/2015  ? Mild mitral regurgitation   ? Mild tricuspid regurgitation   ? Mitral regurgitation 06/13/2018  ? Near syncope 05/16/2016  ? a. felt due to norovirus.  ? Nonallopathic lesion of cervical region 09/01/2018  ?  Nonallopathic lesion of rib cage 09/01/2018  ? Obesity   ? Orthostatic hypotension 05/18/2016  ? PCOS (polycystic ovarian syndrome) 02/13/2015  ? Pulmonary embolism (HCC) 2011  ? neg heme evaluation  felt to be from ocps   ? Scapular dyskinesis 09/01/2018  ? Smoker 12/30/2016  ? Syncope and collapse 05/16/2016  ? Thoracic outlet syndrome of left thoracic outlet 02/15/2018  ? Thoracic radiculopathy due to degenerative joint disease of spine 10/13/2016  ? CT and XR show at least 3 levels of DDD of thoracic spine  I suspect this is related to both the scoliosis and the history of dance  ? ? ?Past Surgical History:  ?Procedure Laterality Date  ? NO PAST SURGERIES    ? ? ? ?Current Outpatient Medications:  ?  albuterol (PROAIR HFA) 108 (90 Base) MCG/ACT inhaler, Inhale 2 puffs into the lungs every 6 (six) hours as needed for wheezing or shortness of breath., Disp: 1 each, Rfl: 0 ?  aspirin EC 81 MG tablet, Take 81 mg by mouth daily. Swallow whole., Disp: , Rfl:  ?  benzonatate (TESSALON PERLES) 100 MG capsule, 1-2 capsules up to twice daily as needed for cough, Disp: 30 capsule, Rfl: 0 ?  busPIRone (BUSPAR) 15 MG tablet, Take 15 mg by mouth 2 (two) times daily., Disp: , Rfl:  ?  cetirizine (ZYRTEC) 10 MG tablet, Take 10 mg by mouth daily., Disp: , Rfl:  ?  sertraline (ZOLOFT) 100 MG tablet, Take 100 mg by mouth daily., Disp: , Rfl:  ?  SUMAtriptan (IMITREX) 25 MG tablet, TAKE 1 TABLET BY MOUTH AT START OF MIGRAINE., Disp: 10 tablet, Rfl: 1 ?  amoxicillin-clavulanate (AUGMENTIN) 875-125 MG tablet, Take 1 tablet by mouth 2 (two) times daily. (Patient not taking: Reported on 07/04/2021), Disp: 20 tablet, Rfl: 0 ?  predniSONE (DELTASONE) 20 MG tablet, Take 1 tablet (20 mg total) by mouth daily with breakfast. (Patient not taking: Reported on 07/04/2021), Disp: 5 tablet, Rfl: 0 ? ?EXAM: ? ?VITALS per patient if applicable:denies fevers ? ?GENERAL: alert, oriented, appears well and in no acute distress ? ?HEENT: atraumatic,  conjunttiva clear, no obvious abnormalities on inspection of external nose and ears ? ?NECK: normal movements of the head and neck ? ?LUNGS: on inspection no signs of respiratory distress, breathing rate appears normal, no obvious gross SOB, gasping or wheezing ? ?CV: no obvious cyanosis ? ?MS: moves all visible extremities without noticeable abnormality ? ?PSYCH/NEURO: pleasant and cooperative, no obvious depression or anxiety, speech and thought processing grossly intact ? ?ASSESSMENT AND PLAN: ? ?Discussed the following assessment and plan: ? ?Cough, unspecified type ? ?Wheeze ? ?-we discussed possible serious and likely etiologies, options for evaluation and workup, limitations of telemedicine visit vs in person visit, treatment, treatment risks and precautions. Pt is agreeable to treatment via telemedicine at this moment. Query viral or allergy related bronchitis vs other. She has opted to try Tessalon for cough and alb prn q 6 hours as she reports this has worked well for her in the past. Taking a daily allergy pill and will continue.  ?Work/School slipped offered: declined ?Advised to seek prompt virtual visit or in person care if worsening, new symptoms arise, or if is not improving with treatment as expected per our conversation of expected course. Discussed options for follow up care. Did let this patient know that I do telemedicine on Tuesdays and Thursdays, but this week will be in the virtual office Monday, Thursday and Friday if she needs follow up with me. Otherwise, advised to schedule follow up visit with PCP, [facility] virtual visits or [facility] if any further questions or concerns to avoid delays in care. ?  ?I discussed the assessment and treatment plan with the patient. The patient was provided an opportunity to ask questions and all were answered. The patient agreed with the plan and demonstrated an understanding of the instructions. ?  ? ? ?Terressa Koyanagi, DO  ? ?

## 2021-07-28 NOTE — Patient Instructions (Signed)
-  I sent the medication(s) we discussed to your pharmacy: ?Meds ordered this encounter  ?Medications  ? albuterol (PROAIR HFA) 108 (90 Base) MCG/ACT inhaler  ?  Sig: Inhale 2 puffs into the lungs every 6 (six) hours as needed for wheezing or shortness of breath.  ?  Dispense:  1 each  ?  Refill:  0  ? benzonatate (TESSALON PERLES) 100 MG capsule  ?  Sig: 1-2 capsules up to twice daily as needed for cough  ?  Dispense:  30 capsule  ?  Refill:  0  ? ? ? ?I hope you are feeling better soon! ? ?Seek in person care promptly if your symptoms worsen, new concerns arise or you are not improving with treatment. ? ?It was nice to meet you today. I help Caroline out with telemedicine visits on Tuesdays and Thursdays and am happy to help if you need a virtual follow up visit on those days. Otherwise, if you have any concerns or questions following this visit please schedule a follow up visit with your Primary Care office or seek care at a local urgent care clinic to avoid delays in care ? ?

## 2021-08-05 ENCOUNTER — Ambulatory Visit: Payer: BC Managed Care – PPO | Admitting: Family Medicine

## 2021-08-08 ENCOUNTER — Ambulatory Visit: Payer: BC Managed Care – PPO | Admitting: Family Medicine

## 2021-08-08 VITALS — BP 122/84 | HR 98 | Ht 63.0 in | Wt 223.0 lb

## 2021-08-08 DIAGNOSIS — M9908 Segmental and somatic dysfunction of rib cage: Secondary | ICD-10-CM | POA: Diagnosis not present

## 2021-08-08 DIAGNOSIS — G2589 Other specified extrapyramidal and movement disorders: Secondary | ICD-10-CM | POA: Diagnosis not present

## 2021-08-08 DIAGNOSIS — M9903 Segmental and somatic dysfunction of lumbar region: Secondary | ICD-10-CM | POA: Diagnosis not present

## 2021-08-08 DIAGNOSIS — M9904 Segmental and somatic dysfunction of sacral region: Secondary | ICD-10-CM | POA: Diagnosis not present

## 2021-08-08 DIAGNOSIS — M9901 Segmental and somatic dysfunction of cervical region: Secondary | ICD-10-CM | POA: Diagnosis not present

## 2021-08-08 DIAGNOSIS — M9902 Segmental and somatic dysfunction of thoracic region: Secondary | ICD-10-CM

## 2021-08-08 NOTE — Progress Notes (Signed)
?[name] D.O. ?[facility] ?[place] ?Phone: [phone] ?Subjective:   ? ?I'm seeing this patient by the request  of:  Panosh, Standley Brooking, MD ? ?CC: Mid back pain ? ?[id number]  ?Sheri [name] is a 31 y.o. female coming in with complaint of back and neck pain. OMT 07/02/2021. Patient states that she is the same as last visit. Pain continues to occur mostly at night.  Tightness seems to be more in the thoracic spine.  Just right of the midline she points towards more the thoracolumbar juncture.  States that if she has leg pain has some pain down the right arm.  States that if her husband massages the area seems to do better. ? ?Medications patient has been prescribed: None ? ?Taking: ? ? ?  ? ? ? ? ?Reviewed prior external information including notes and imaging from previsou exam, outside providers and external EMR if available.  ? ?As well as notes that were available from care everywhere and other healthcare systems. ? ?Past medical history, social, surgical and family history all reviewed in electronic medical record.  No pertanent information unless stated regarding to the chief complaint.  ? ?Past Medical History:  ?Diagnosis Date  ? Alcohol addiction ([place])   ? Anxiety   ? Bipolar depression ([place])   ? under psych rx   ? Depression   ? Drug addiction in remission [facility])   ? heroin  ? Enteritis due to Norovirus 05/18/2016  ? Hx of pulmonary embolus 02/13/2015  ? Hyperlipidemia   ? Hypokalemia 05/17/2016  ? IUD (intrauterine device) in place 02/13/2015  ? Mild mitral regurgitation   ? Mild tricuspid regurgitation   ? Mitral regurgitation 06/13/2018  ? Near syncope 05/16/2016  ? a. felt due to norovirus.  ? Nonallopathic lesion of cervical region 09/01/2018  ? Nonallopathic lesion of rib cage 09/01/2018  ? Obesity   ? Orthostatic hypotension 05/18/2016  ? PCOS (polycystic ovarian syndrome) 02/13/2015  ? Pulmonary embolism ([place]) 2011  ? neg heme evaluation  felt to be  from ocps   ? Scapular dyskinesis 09/01/2018  ? Smoker 12/30/2016  ? Syncope and collapse 05/16/2016  ? Thoracic outlet syndrome of left thoracic outlet 02/15/2018  ? Thoracic radiculopathy due to degenerative joint disease of spine 10/13/2016  ? CT and XR show at least 3 levels of DDD of thoracic spine  I suspect this is related to both the scoliosis and the history of dance  ?  ?Allergies  ?Allergen Reactions  ? Bupropion Other (See Comments)  ? ? ? ?Review of Systems: ? No headache, visual changes, nausea, vomiting, diarrhea, constipation, dizziness, abdominal pain, skin rash, fevers, chills, night sweats, weight loss, swollen lymph nodes, body aches, joint swelling, chest pain, shortness of breath, mood changes. POSITIVE muscle aches ? ?Objective  ?Blood pressure 122/84, pulse 98, height 5\' 3"  (1.6 m), weight 223 lb (101.2 kg), SpO2 98 %, currently breastfeeding. ?  ?General: No apparent distress alert and oriented x3 mood and affect normal, dressed appropriately.  ?HEENT: Pupils equal, extraocular movements intact  ?Respiratory: Patient's speak in full sentences and does not appear short of breath  ?Cardiovascular: No lower extremity edema, non tender, no erythema  ?Neuro: Cranial nerves II through XII are intact, neurovascularly intact in all extremities with 2+ DTRs and 2+ pulses.  ?Gait normal with good balance and coordination.  ?MSK:  Non tender with full range of motion and good stability and symmetric strength and tone  of shoulders, elbows, wrist, hip, knee and ankles bilaterally.  ?Back - Normal skin, Spine with normal alignment and no deformity.  No tenderness to vertebral process palpation.  Paraspinous muscles are not tender and without spasm.   Range of motion is full at neck and lumbar sacral regions ? ?Osteopathic findings ? ?C2 flexed rotated and side bent right ?C7 flexed rotated and side bent left ?T4 extended rotated and side bent right inhaled rib ?T11 extended rotated and side bent right ?L2  flexed rotated and side bent right ?Sacrum right on right ? ? ? ? ?  ?Assessment and Plan: ? ?Scapular dyskinesis ?Chronic problem with exacerbation.  Discussed the other over-the-counter medications such as ibuprofen but could be more beneficial.  Discussed icing regimen and home exercises.  Worsening pain need to consider the possibility of another prednisone pack.  Discussed different other modalities that could help her on follow-up.  In 6 to 8 weeks  ? ?Nonallopathic problems ? ?Decision today to treat with OMT was based on Physical Exam ? ?After verbal consent patient was treated with HVLA, ME, FPR techniques in cervical, rib, thoracic, lumbar, and sacral  areas ? ?Patient tolerated the procedure well with improvement in symptoms ? ?Patient given exercises, stretches and lifestyle modifications ? ?See medications in patient instructions if given ? ?Patient will follow up in 4-8 weeks ? ?  ? ? ?The above documentation has been reviewed and is accurate and complete Lyndal Pulley, DO ? ? ?  ? ? Note: This dictation was prepared with Dragon dictation along with smaller phrase technology. Any transcriptional errors that result from this process are unintentional.    ?  ?  ? ? ?

## 2021-08-08 NOTE — Patient Instructions (Addendum)
Congrats on job ?See me in 6-8 weeks ?

## 2021-08-08 NOTE — Assessment & Plan Note (Signed)
Chronic problem with exacerbation.  Discussed the other over-the-counter medications such as ibuprofen but could be more beneficial.  Discussed icing regimen and home exercises.  Worsening pain need to consider the possibility of another prednisone pack.  Discussed different other modalities that could help her on follow-up.  In 6 to 8 weeks ?

## 2021-09-10 ENCOUNTER — Ambulatory Visit: Payer: BC Managed Care – PPO | Admitting: Family Medicine

## 2021-09-19 ENCOUNTER — Ambulatory Visit: Payer: BC Managed Care – PPO | Admitting: Family Medicine

## 2021-11-05 ENCOUNTER — Telehealth: Payer: Self-pay | Admitting: Internal Medicine

## 2021-11-05 DIAGNOSIS — F319 Bipolar disorder, unspecified: Secondary | ICD-10-CM

## 2021-11-05 DIAGNOSIS — F39 Unspecified mood [affective] disorder: Secondary | ICD-10-CM

## 2021-11-05 NOTE — Telephone Encounter (Signed)
Last Ov 06/12/20 Please advise

## 2021-11-05 NOTE — Telephone Encounter (Signed)
Pt called to state she had an insurance change Counselling psychologist / HMO - Eff: 10/18/21)   Pt now must go to [facility] for her mental health needs, but needs a referral from [facility].   Please advise.  [phone]

## 2021-11-05 NOTE — Telephone Encounter (Signed)
Ok to refer.

## 2021-11-06 ENCOUNTER — Other Ambulatory Visit: Payer: Self-pay

## 2021-11-06 DIAGNOSIS — F319 Bipolar disorder, unspecified: Secondary | ICD-10-CM

## 2021-11-06 DIAGNOSIS — F39 Unspecified mood [affective] disorder: Secondary | ICD-10-CM

## 2021-11-06 NOTE — Telephone Encounter (Signed)
Referral placed.

## 2021-11-10 NOTE — Addendum Note (Signed)
Addended by: Mary Sella D on: 11/10/2021 09:38 AM   Modules accepted: Orders

## 2021-12-05 ENCOUNTER — Ambulatory Visit (HOSPITAL_COMMUNITY): Payer: Self-pay | Admitting: Psychiatry

## 2021-12-23 ENCOUNTER — Encounter: Payer: Self-pay | Admitting: Internal Medicine

## 2021-12-23 NOTE — Telephone Encounter (Signed)
Error/njr °

## 2021-12-24 ENCOUNTER — Ambulatory Visit: Payer: BC Managed Care – PPO | Admitting: Internal Medicine

## 2021-12-31 ENCOUNTER — Ambulatory Visit: Payer: BC Managed Care – PPO | Admitting: Internal Medicine

## 2022-01-06 ENCOUNTER — Ambulatory Visit: Payer: Commercial Managed Care - HMO | Admitting: Internal Medicine

## 2022-01-06 ENCOUNTER — Encounter: Payer: Self-pay | Admitting: Internal Medicine

## 2022-01-06 VITALS — BP 104/76 | HR 87 | Temp 98.0°F | Wt 222.6 lb

## 2022-01-06 DIAGNOSIS — R253 Fasciculation: Secondary | ICD-10-CM

## 2022-01-06 DIAGNOSIS — M791 Myalgia, unspecified site: Secondary | ICD-10-CM | POA: Diagnosis not present

## 2022-01-06 DIAGNOSIS — R200 Anesthesia of skin: Secondary | ICD-10-CM | POA: Diagnosis not present

## 2022-01-06 DIAGNOSIS — Z23 Encounter for immunization: Secondary | ICD-10-CM

## 2022-01-06 NOTE — Progress Notes (Signed)
Chief Complaint  Patient presents with   Muscle Pain    Pt c/o muscle pain and spasm on both arms, legs and back. Been going for 5 years now. Feels her condition are worsen. Not taking any medication for it.     HPI: Charesse Vanover 31 y.o. come in for ongoing musculoskeletal complaints that most recently have accelerated and becoming worse sees Dr. Dr. Katrinka Blazing for some musculoskeletal difficulties However she is getting shooting pains left arm and leg and then foot with back of blame and medial foot on the left occasional numbing and tingling when she walks a while both feet turn numb but not colors or cold  Sees  muscles  spasm fasciculations and twitching we believe in her left upper arm and when walks feet get numb and    Antidepressants  2021 no recent change  Remote hx  of opiate  use  off for  years.  Covering doing well Is asking for help or other medication that could possibly help Family history of autoimmune and may be RA Negative TAD.  ROS: See pertinent positives and negatives per HPI. No pertinent symptoms with childbirth. Past Medical History:  Diagnosis Date   Alcohol addiction (HCC)    Anxiety    Bipolar depression (HCC)    under psych rx    Depression    Drug addiction in remission [facility])    heroin   Enteritis due to Norovirus 05/18/2016   Hx of pulmonary embolus 02/13/2015   Hyperlipidemia    Hypokalemia 05/17/2016   IUD (intrauterine device) in place 02/13/2015   Mild mitral regurgitation    Mild tricuspid regurgitation    Mitral regurgitation 06/13/2018   Near syncope 05/16/2016   a. felt due to norovirus.   Nonallopathic lesion of cervical region 09/01/2018   Nonallopathic lesion of rib cage 09/01/2018   Obesity    Orthostatic hypotension 05/18/2016   PCOS (polycystic ovarian syndrome) 02/13/2015   Pulmonary embolism (HCC) 2011   neg heme evaluation  felt to be from ocps    Scapular dyskinesis 09/01/2018   Smoker 12/30/2016   Syncope and collapse  05/16/2016   Thoracic outlet syndrome of left thoracic outlet 02/15/2018   Thoracic radiculopathy due to degenerative joint disease of spine 10/13/2016   CT and XR show at least 3 levels of DDD of thoracic spine  I suspect this is related to both the scoliosis and the history of dance    Family History  Problem Relation Age of Onset   Colon cancer Maternal Grandfather    Hypertension Father    Irritable bowel syndrome Father     Social History   Socioeconomic History   Marital status: Married    Spouse name: Not on file   Number of children: Not on file   Years of education: Not on file   Highest education level: Bachelor's degree (e.g., BA, AB, BS)  Occupational History   Occupation: nanny  Tobacco Use   Smoking status: Former    Types: Cigarettes    Quit date: 12/20/2015    Years since quitting: 6.0   Smokeless tobacco: Never  Vaping Use   Vaping Use: Never used  Substance and Sexual Activity   Alcohol use: No    Alcohol/week: 0.0 standard drinks of alcohol   Drug use: No   Sexual activity: Not on file    Comment: previous IUD  Other Topics Concern   Not on file  Social History Narrative   5-10 hours  of sleep per night   Works part time as a Surveyor, [employer]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)   Recovering from drug and alcohol addiction   Joined [employer]   Lives with her parents   2 dogs in the home      [place] 3 years  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    Social Determinants of Health   Financial Resource Strain: Medium Risk (01/05/2022)   Overall Financial Resource Strain (CARDIA)    Difficulty of Paying Living Expenses: Somewhat hard  Food Insecurity: Food Insecurity Present (01/05/2022)   Hunger Vital Sign    Worried About Running Out of Food in the Last Year: Sometimes true    Ran Out of Food in the Last Year: Never true  Transportation Needs: No Transportation Needs (01/05/2022)   PRAPARE - Hydrologist (Medical): No    Lack of Transportation  (Non-Medical): No  Physical Activity: Insufficiently Active (01/05/2022)   Exercise Vital Sign    Days of Exercise per Week: 2 days    Minutes of Exercise per Session: 30 min  Stress: Stress Concern Present (01/05/2022)   [place]    Feeling of Stress : To some extent  Social Connections: Moderately Integrated (01/05/2022)   Social Connection and Isolation Panel [NHANES]    Frequency of Communication with Friends and Family: More than three times a week    Frequency of Social Gatherings with Friends and Family: Three times a week    Attends Religious Services: Never    Active Member of Clubs or Organizations: Yes    Attends Music therapist: More than 4 times per year    Marital Status: Married    Outpatient Medications Prior to Visit  Medication Sig Dispense Refill   albuterol (PROAIR HFA) 108 (90 Base) MCG/ACT inhaler Inhale 2 puffs into the lungs every 6 (six) hours as needed for wheezing or shortness of breath. 1 each 0   amoxicillin-clavulanate (AUGMENTIN) 875-125 MG tablet Take 1 tablet by mouth 2 (two) times daily. 20 tablet 0   aspirin EC 81 MG tablet Take 81 mg by mouth daily. Swallow whole.     busPIRone (BUSPAR) 15 MG tablet Take 15 mg by mouth 2 (two) times daily.     cetirizine (ZYRTEC) 10 MG tablet Take 10 mg by mouth daily.     sertraline (ZOLOFT) 100 MG tablet Take 100 mg by mouth daily.     SUMAtriptan (IMITREX) 25 MG tablet TAKE 1 TABLET BY MOUTH AT START OF MIGRAINE. 10 tablet 1   benzonatate (TESSALON PERLES) 100 MG capsule 1-2 capsules up to twice daily as needed for cough 30 capsule 0   predniSONE (DELTASONE) 20 MG tablet Take 1 tablet (20 mg total) by mouth daily with breakfast. 5 tablet 0   No facility-administered medications prior to visit.     EXAM:  BP 104/76 (BP Location: Left Arm, Patient Position: Sitting, Cuff Size: Large)   Pulse 87   Temp 98 F (36.7 C) (Oral)   Wt  222 lb 9.6 oz (101 kg)   LMP 12/10/2021 (Exact Date)   SpO2 97%   BMI 39.43 kg/m   Body mass index is 39.43 kg/m.  GENERAL: vitals reviewed and listed above, alert, oriented, appears well hydrated and in no acute distress HEENT: atraumatic, conjunctiva  clear, no obvious abnormalities on inspection of external nose and ears NECK: no obvious masses on inspection palpation  LUNGS: clear to auscultation bilaterally,  no wheezes, rales or rhonchi, good air movement CV: HRRR, no clubbing cyanosis or  peripheral edema nl cap refill  MS: moves all extremities without noticeable focal  abnormality no acute swelling no fasciculations seen today DTRs are present without clonus pulses appear intact with normal capillary refill. PSYCH: pleasant and cooperative, no obvious depression or anxiety Lab Results  Component Value Date   WBC 9.3 06/12/2020   HGB 12.5 06/12/2020   HCT 37.5 06/12/2020   PLT 386.0 06/12/2020   GLUCOSE 82 06/12/2020   CHOL 235 (H) 06/12/2020   TRIG 139.0 06/12/2020   HDL 66.40 06/12/2020   LDLDIRECT 150.0 02/13/2015   LDLCALC 141 (H) 06/12/2020   ALT 13 06/12/2020   AST 12 06/12/2020   NA 136 06/12/2020   K 4.3 06/12/2020   CL 101 06/12/2020   CREATININE 0.70 06/12/2020   BUN 12 06/12/2020   CO2 27 06/12/2020   TSH 3.13 06/12/2020   INR 1.0 12/11/2019   HGBA1C 5.8 06/12/2020   BP Readings from Last 3 Encounters:  01/06/22 104/76  08/08/21 122/84  07/04/21 102/72    ASSESSMENT AND PLAN:  Discussed the following assessment and plan:  Numbness - Plan: Basic metabolic panel, CBC with Differential/Platelet, Hemoglobin A1c, Hepatic function panel, TSH, ANA, CK, Vitamin B12, C-reactive protein, C-reactive protein, Vitamin B12, CK, ANA, TSH, Hepatic function panel, Hemoglobin A1c, CBC with Differential/Platelet, Basic metabolic panel  Muscle twitch - Plan: Basic metabolic panel, CBC with Differential/Platelet, Hemoglobin A1c, Hepatic function panel, TSH, ANA, CK,  Vitamin B12, C-reactive protein, C-reactive protein, Vitamin B12, CK, ANA, TSH, Hepatic function panel, Hemoglobin A1c, CBC with Differential/Platelet, Basic metabolic panel  Muscle pain - Plan: Basic metabolic panel, CBC with Differential/Platelet, Hemoglobin A1c, Hepatic function panel, TSH, ANA, CK, Vitamin B12, C-reactive protein, C-reactive protein, Vitamin B12, CK, ANA, TSH, Hepatic function panel, Hemoglobin A1c, CBC with Differential/Platelet, Basic metabolic panel  Influenza vaccine needed - Plan: Flu Vaccine QUAD 6+ mos PF IM (Fluarix Quad PF) Ongoing symptoms but now new numbness in her feet after walking a while without obvious weakness and also complaint of fasciculations want her to calendar these symptoms and specifics update her labs today and do appropriate referral I do not have a medicine that will make this go away when I do not know what is causing it. Does not seem like it is her medication not on high-dose medicine to consider serotonin syndrome. Consider having her see rheumatology or neurology depending on lab results Consider white matter disease. -Patient advised to return or notify health care team  if  new concerns arise.  Patient Instructions  Labs today  Stay hydrated  Planreferral either to neurology or rheumatology for help assessment depending on results.    Standley Brooking. Gwin Eagon M.D.

## 2022-01-06 NOTE — Patient Instructions (Addendum)
Labs today  Stay hydrated  Plan referral either to neurology or rheumatology for help assessment depending on results.

## 2022-01-07 LAB — ANA: Anti Nuclear Antibody (ANA): NEGATIVE

## 2022-01-07 LAB — VITAMIN B12: Vitamin B-12: 300 pg/mL (ref 211–911)

## 2022-01-07 LAB — HEPATIC FUNCTION PANEL
ALT: 28 U/L (ref 0–35)
AST: 21 U/L (ref 0–37)
Albumin: 4.2 g/dL (ref 3.5–5.2)
Alkaline Phosphatase: 52 U/L (ref 39–117)
Bilirubin, Direct: 0.1 mg/dL (ref 0.0–0.3)
Total Bilirubin: 0.3 mg/dL (ref 0.2–1.2)
Total Protein: 7.9 g/dL (ref 6.0–8.3)

## 2022-01-07 LAB — CBC WITH DIFFERENTIAL/PLATELET
Basophils Absolute: 0 10*3/uL (ref 0.0–0.1)
Basophils Relative: 0.2 % (ref 0.0–3.0)
Eosinophils Absolute: 0.2 10*3/uL (ref 0.0–0.7)
Eosinophils Relative: 2.4 % (ref 0.0–5.0)
HCT: 39.3 % (ref 36.0–46.0)
Hemoglobin: 13.1 g/dL (ref 12.0–15.0)
Lymphocytes Relative: 25.3 % (ref 12.0–46.0)
Lymphs Abs: 2.4 10*3/uL (ref 0.7–4.0)
MCHC: 33.2 g/dL (ref 30.0–36.0)
MCV: 85.1 fl (ref 78.0–100.0)
Monocytes Absolute: 0.7 10*3/uL (ref 0.1–1.0)
Monocytes Relative: 7.7 % (ref 3.0–12.0)
Neutro Abs: 6.1 10*3/uL (ref 1.4–7.7)
Neutrophils Relative %: 64.4 % (ref 43.0–77.0)
Platelets: 332 10*3/uL (ref 150.0–400.0)
RBC: 4.61 Mil/uL (ref 3.87–5.11)
RDW: 14 % (ref 11.5–15.5)
WBC: 9.5 10*3/uL (ref 4.0–10.5)

## 2022-01-07 LAB — BASIC METABOLIC PANEL
BUN: 11 mg/dL (ref 6–23)
CO2: 26 mEq/L (ref 19–32)
Calcium: 9.7 mg/dL (ref 8.4–10.5)
Chloride: 100 mEq/L (ref 96–112)
Creatinine, Ser: 0.76 mg/dL (ref 0.40–1.20)
GFR: 103.53 mL/min (ref >60.00)
Glucose, Bld: 73 mg/dL (ref 70–99)
Potassium: 4.1 mEq/L (ref 3.5–5.1)
Sodium: 136 mEq/L (ref 135–145)

## 2022-01-07 LAB — CK: Total CK: 69 U/L (ref 7–177)

## 2022-01-07 LAB — HEMOGLOBIN A1C: Hgb A1c MFr Bld: 6.3 % (ref 4.6–6.5)

## 2022-01-07 LAB — TSH: TSH: 2.77 u[IU]/mL (ref 0.35–5.50)

## 2022-01-07 LAB — C-REACTIVE PROTEIN: CRP: 1.1 mg/dL (ref 0.5–20.0)

## 2022-01-08 ENCOUNTER — Other Ambulatory Visit: Payer: Self-pay | Admitting: Internal Medicine

## 2022-01-08 DIAGNOSIS — R200 Anesthesia of skin: Secondary | ICD-10-CM

## 2022-01-08 DIAGNOSIS — M791 Myalgia, unspecified site: Secondary | ICD-10-CM

## 2022-01-08 DIAGNOSIS — R253 Fasciculation: Secondary | ICD-10-CM

## 2022-01-08 NOTE — Progress Notes (Signed)
Results are  normal  although b12 is low normal . Advise supplement with 1000 mcg b12 per day . I have placed a referral to neurology to evaluate progressive numbness and  muscle twitching

## 2022-01-12 ENCOUNTER — Encounter: Payer: Self-pay | Admitting: Neurology

## 2022-01-21 ENCOUNTER — Ambulatory Visit (HOSPITAL_BASED_OUTPATIENT_CLINIC_OR_DEPARTMENT_OTHER): Payer: Commercial Managed Care - HMO | Admitting: Psychiatry

## 2022-01-21 ENCOUNTER — Encounter (HOSPITAL_COMMUNITY): Payer: Self-pay | Admitting: Psychiatry

## 2022-01-21 VITALS — Wt 220.0 lb

## 2022-01-21 DIAGNOSIS — F419 Anxiety disorder, unspecified: Secondary | ICD-10-CM

## 2022-01-21 DIAGNOSIS — F39 Unspecified mood [affective] disorder: Secondary | ICD-10-CM

## 2022-01-21 MED ORDER — BUSPIRONE HCL 15 MG PO TABS: 15.0000 mg | ORAL_TABLET | Freq: Two times a day (BID) | ORAL | 0 refills | Status: DC

## 2022-01-21 MED ORDER — LAMOTRIGINE 25 MG PO TABS: ORAL_TABLET | ORAL | 1 refills | Status: DC

## 2022-01-21 MED ORDER — SERTRALINE HCL 50 MG PO TABS: 50.0000 mg | ORAL_TABLET | Freq: Every day | ORAL | 1 refills | Status: DC
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
## 2022-02-12 ENCOUNTER — Telehealth (HOSPITAL_BASED_OUTPATIENT_CLINIC_OR_DEPARTMENT_OTHER): Payer: Commercial Managed Care - HMO | Admitting: Psychiatry

## 2022-02-12 ENCOUNTER — Encounter (HOSPITAL_COMMUNITY): Payer: Self-pay | Admitting: Psychiatry

## 2022-02-12 DIAGNOSIS — F39 Unspecified mood [affective] disorder: Secondary | ICD-10-CM

## 2022-02-12 DIAGNOSIS — F419 Anxiety disorder, unspecified: Secondary | ICD-10-CM

## 2022-02-12 MED ORDER — SERTRALINE HCL 50 MG PO TABS: 50.0000 mg | ORAL_TABLET | Freq: Every day | ORAL | 0 refills | Status: DC

## 2022-02-12 MED ORDER — LAMOTRIGINE 25 MG PO TABS: ORAL_TABLET | ORAL | 0 refills | Status: DC

## 2022-02-12 MED ORDER — BUSPIRONE HCL 15 MG PO TABS: 15.0000 mg | ORAL_TABLET | Freq: Two times a day (BID) | ORAL | 0 refills | Status: DC
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
## 2022-03-20 NOTE — Progress Notes (Unsigned)
[name] Note - Initial Visit   Date: 03/23/2022   [name] MRN: [id number] DOB: [birth year]   Dear Dr. Regis Bill:   Thank you for your kind referral of Sheri [name] for consultation of muscle twitches. Although her history is well known to you, please allow [name] to reiterate it for the purpose of our medical record. The patient was accompanied to the clinic by self.    Sheri [name] is a 31 y.o. right-handed female with bipolar depression, migraines, history of opioid addiction, and history of alcohol abuse (sober since 2016) presenting for evaluation of muscle twitches and numbness/tingling.   IMPRESSION/PLAN: Whole body pain.  Patient was reassured that she has a normal neurological exam making primary neuromuscular condition very unlikely explanation for her pain.  To be complete, she will return for NCS/EMG of the right arm and leg.  If normal, she may have a chronic pain syndrome such as fibromyalgia.   Muscle twitches, benign.  No abnormal movements were observed on today's exam.  Most likely benign movements in the setting of normal exam.    ------------------------------------------------------------- History of present illness: Starting early summer, she began having sensation of muscle twitching in the face, arm, and legs.  It occurs about 3-4 days per month.  No associated weakness in the arms or legs.  For the past 6 years, she has whole body pain, which she describes as firey pain.  She has episodic shooting pain all over the arms, legs,and torso.  She has been working with sports medicine and PT which helps some.  Prior labs include CK, TSH, vitamin B12 which has been normal.   She works in Location manager for [employer].  She lives at home with husband and two children.   Non-smoker.  Prior history of alcohol and substance abuse.  She has been sober since 2016.  Out-side paper records,  electronic medical record, and images have been reviewed where available and summarized as:  Lab Results  Component Value Date   HGBA1C 6.3 01/06/2022   Lab Results  Component Value Date   VITAMINB12 300 01/06/2022   Lab Results  Component Value Date   TSH 2.77 01/06/2022   Lab Results  Component Value Date   CKTOTAL 69 01/06/2022   TROPONINI <0.03 05/16/2016     Past Medical History:  Diagnosis Date   Alcohol addiction ([place])    Anxiety    Bipolar depression ([place])    under psych rx    Depression    Drug addiction in remission ([place])    heroin   Enteritis due to Norovirus 05/18/2016   Hx of pulmonary embolus 02/13/2015   Hyperlipidemia    Hypokalemia 05/17/2016   IUD (intrauterine device) in place 02/13/2015   Mild mitral regurgitation    Mild tricuspid regurgitation    Mitral regurgitation 06/13/2018   Near syncope 05/16/2016   a. felt due to norovirus.   Nonallopathic lesion of cervical region 09/01/2018   Nonallopathic lesion of rib cage 09/01/2018   Obesity    Orthostatic hypotension 05/18/2016   PCOS (polycystic ovarian syndrome) 02/13/2015   Pulmonary embolism ([place]) 2011   neg heme evaluation  felt to be from ocps    Scapular dyskinesis 09/01/2018   Smoker 12/30/2016   Syncope and collapse 05/16/2016   Thoracic outlet syndrome of left thoracic outlet 02/15/2018   Thoracic radiculopathy due to degenerative joint disease of spine 10/13/2016   CT and XR show at least 3  levels of DDD of thoracic spine  I suspect this is related to both the scoliosis and the history of dance    Past Surgical History:  Procedure Laterality Date   NO PAST SURGERIES       Medications:  Outpatient Encounter Medications as of 03/23/2022  Medication Sig   albuterol (PROAIR HFA) 108 (90 Base) MCG/ACT inhaler Inhale 2 puffs into the lungs every 6 (six) hours as needed for wheezing or shortness of breath.   aspirin EC 81 MG tablet Take 81 mg by mouth daily. Swallow whole.   busPIRone  (BUSPAR) 15 MG tablet Take 1 tablet (15 mg total) by mouth 2 (two) times daily.   cetirizine (ZYRTEC) 10 MG tablet Take 10 mg by mouth daily.   lamoTRIgine (LAMICTAL) 25 MG tablet Take one tab bid   sertraline (ZOLOFT) 50 MG tablet Take 1 tablet (50 mg total) by mouth daily.   SUMAtriptan (IMITREX) 25 MG tablet TAKE 1 TABLET BY MOUTH AT START OF MIGRAINE.   No facility-administered encounter medications on file as of 03/23/2022.    Allergies:  Allergies  Allergen Reactions   Bupropion Other (See Comments)    Family History: Family History  Problem Relation Age of Onset   Mental illness Mother    Hypertension Father    Irritable bowel syndrome Father    Colon cancer Maternal Grandfather     Social History: Social History   Tobacco Use   Smoking status: Former    Types: Cigarettes    Quit date: 12/20/2015    Years since quitting: 6.2   Smokeless tobacco: Never  Vaping Use   Vaping Use: Never used  Substance Use Topics   Alcohol use: No    Alcohol/week: 0.0 standard drinks of alcohol   Drug use: No   Social History   Social History Narrative   5-10 hours of sleep per night   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  Speech is not dysarthric.  CRANIAL NERVES: II:  No visual field defects.     III-IV-VI: Pupils equal round and reactive to light.  Normal conjugate, extra-ocular eye movements in all directions of gaze.  No nystagmus.  No ptosis.   V:  Normal  facial sensation.    VII:  Normal facial symmetry and movements.   VIII:  Normal hearing and vestibular function.   IX-X:  Normal palatal movement.   XI:  Normal shoulder shrug and head rotation.   XII:  Normal tongue strength and range of motion, no deviation or fasciculation.  MOTOR:  No atrophy, fasciculations or abnormal movements.  No pronator drift.   Upper Extremity:  Right  Left  Deltoid  5/5   5/5   Biceps  5/5   5/5   Triceps  5/5  5/5   Infraspinatus 5/5  5/5  Medial pectoralis 5/5  5/5  Wrist extensors  5/5   5/5   Wrist flexors  5/5   5/5   Finger extensors  5/5   5/5   Finger flexors  5/5   5/5   Dorsal interossei  5/5   5/5   Abductor pollicis  5/5   5/5   Tone (Ashworth scale)  0  0   Lower Extremity:  Right  Left  Hip flexors  5/5   5/5   Hip extensors  5/5   5/5   Adductor 5/5  5/5  Abductor 5/5  5/5  Knee flexors  5/5   5/5   Knee extensors  5/5   5/5   Dorsiflexors  5/5   5/5   Plantarflexors  5/5   5/5   Toe extensors  5/5   5/5   Toe flexors  5/5   5/5   Tone (Ashworth scale)  0  0   MSRs:                                           Right        Left brachioradialis 2+  2+  biceps 2+  2+  triceps 2+  2+  patellar 2+  2+  ankle jerk 2+  2+  Hoffman no  no  plantar response down  down   SENSORY:  Normal and symmetric perception of light touch, pinprick, vibration, and proprioception.  Romberg's sign absent.   COORDINATION/GAIT: Normal finger-to- nose-finger.  Intact rapid alternating movements bilaterally.  Gait narrow based and stable. Tandem and stressed gait intact.    Thank you for allowing me to participate in patient's care.  If I can answer any additional questions, I would be pleased to do so.    Sincerely,    Armiyah Capron K. Posey Pronto, DO

## 2022-03-23 ENCOUNTER — Encounter: Payer: Self-pay | Admitting: Neurology

## 2022-03-23 ENCOUNTER — Ambulatory Visit: Payer: Commercial Managed Care - HMO | Admitting: Neurology

## 2022-03-23 VITALS — BP 107/76 | HR 92 | Ht 63.0 in | Wt 219.0 lb

## 2022-03-23 DIAGNOSIS — R253 Fasciculation: Secondary | ICD-10-CM | POA: Diagnosis not present

## 2022-03-23 DIAGNOSIS — R52 Pain, unspecified: Secondary | ICD-10-CM

## 2022-03-23 NOTE — Patient Instructions (Signed)
ELECTROMYOGRAM AND NERVE CONDUCTION STUDIES (EMG/NCS) INSTRUCTIONS  How to Prepare The neurologist conducting the EMG will need to know if you have certain medical conditions. Tell the neurologist and other EMG lab personnel if you: . Have a pacemaker or any other electrical medical device . Take blood-thinning medications . Have hemophilia, a blood-clotting disorder that causes prolonged bleeding Bathing Take a shower or bath shortly before your exam in order to remove oils from your skin. Don't apply lotions or creams before the exam.  What to Expect You'll likely be asked to change into a hospital gown for the procedure and lie down on an examination table. The following explanations can help you understand what will happen during the exam.  . Electrodes. The neurologist or a technician places surface electrodes at various locations on your skin depending on where you're experiencing symptoms. Or the neurologist may insert needle electrodes at different sites depending on your symptoms.  . Sensations. The electrodes will at times transmit a tiny electrical current that you may feel as a twinge or spasm. The needle electrode may cause discomfort or pain that usually ends shortly after the needle is removed. If you are concerned about discomfort or pain, you may want to talk to the neurologist about taking a short break during the exam.  . Instructions. During the needle EMG, the neurologist will assess whether there is any spontaneous electrical activity when the muscle is at rest - activity that isn't present in healthy muscle tissue - and the degree of activity when you slightly contract the muscle.  He or she will give you instructions on resting and contracting a muscle at appropriate times. Depending on what muscles and nerves the neurologist is examining, he or she may ask you to change positions during the exam.  After your EMG You may experience some temporary, minor bruising where the  needle electrode was inserted into your muscle. This bruising should fade within several days. If it persists, contact your primary care doctor.    

## 2022-04-26 DIAGNOSIS — R030 Elevated blood-pressure reading, without diagnosis of hypertension: Secondary | ICD-10-CM | POA: Diagnosis not present

## 2022-04-26 DIAGNOSIS — R051 Acute cough: Secondary | ICD-10-CM | POA: Diagnosis not present

## 2022-04-27 ENCOUNTER — Telehealth: Payer: Self-pay | Admitting: Neurology

## 2022-04-27 NOTE — Telephone Encounter (Signed)
Pt called to cancel her EMG appt for 04/28/22, she is concerned because her insurance was telling her a certain code would cost her $5,000. She is wondering what code would be billed so she can find out for sure if her insurance would cover the EMG?

## 2022-04-27 NOTE — Telephone Encounter (Signed)
#  [id number] [phone] 

## 2022-04-28 ENCOUNTER — Encounter: Payer: Commercial Managed Care - HMO | Admitting: Neurology

## 2022-04-28 NOTE — Telephone Encounter (Signed)
Called and left detailed message of the CPT codes. And to call back if needed.

## 2022-04-29 ENCOUNTER — Telehealth: Payer: Self-pay | Admitting: Internal Medicine

## 2022-04-29 NOTE — Telephone Encounter (Signed)
Error

## 2022-04-30 ENCOUNTER — Encounter: Payer: Self-pay | Admitting: Family Medicine

## 2022-04-30 ENCOUNTER — Ambulatory Visit: Payer: 59 | Admitting: Family Medicine

## 2022-04-30 ENCOUNTER — Ambulatory Visit (INDEPENDENT_AMBULATORY_CARE_PROVIDER_SITE_OTHER): Payer: 59

## 2022-04-30 VITALS — BP 124/80 | HR 101 | Temp 98.4°F | Wt 224.4 lb

## 2022-04-30 DIAGNOSIS — J40 Bronchitis, not specified as acute or chronic: Secondary | ICD-10-CM

## 2022-04-30 DIAGNOSIS — R059 Cough, unspecified: Secondary | ICD-10-CM | POA: Diagnosis not present

## 2022-04-30 DIAGNOSIS — J040 Acute laryngitis: Secondary | ICD-10-CM | POA: Diagnosis not present

## 2022-04-30 MED ORDER — AMOXICILLIN 875 MG PO TABS: 875.0000 mg | ORAL_TABLET | Freq: Two times a day (BID) | ORAL | 0 refills | Status: AC

## 2022-04-30 MED ORDER — ALBUTEROL SULFATE HFA 108 (90 BASE) MCG/ACT IN AERS: 2.0000 | INHALATION_SPRAY | Freq: Four times a day (QID) | RESPIRATORY_TRACT | 1 refills | Status: AC | PRN

## 2022-04-30 NOTE — Progress Notes (Signed)
Acute Office Visit  Subjective:     Patient ID: Sheri [name], female    DOB: 1990/07/05, 32 y.o.   MRN: [id number]  Chief Complaint  Patient presents with   Cough    Pt c/o dry cough since 2nd wk of December along with raspy voice, a little bit of wheezing. Was seen at [facility] this past Sunday and was prescribe with 5 days prednisone. Denied fever or chills. Taking inhaler and robitussin.     HPI Patient is followed by Dr. Regis Bill in today for ongoing concern.  Patient endorses dry cough x 4 weeks.  Also causing laryngitis and mild wheezing.  Patient denies fever, chills, ear pain/pressure, facial pain/pressure, rhinorrhea, sore throat, seasonal allergies, GERD.  Tried Delsym, tea, honey, warm liquids, Robitussin, humidifier.  [facility]  Given Rx for albuterol inhaler, 5-day prednisone 20 mg burst.  Cough is less severe.  Review of Systems  Constitutional:  Negative for chills and fever.  HENT:  Negative for congestion, ear pain, sinus pain and sore throat.   Respiratory:  Positive for cough and wheezing. Negative for sputum production and shortness of breath.   Gastrointestinal:  Negative for abdominal pain, nausea and vomiting.  Musculoskeletal:  Negative for joint pain and myalgias.  Neurological:  Negative for headaches.        Objective:    BP 124/80 (BP Location: Left Arm, Patient Position: Sitting, Cuff Size: Large)   Pulse (!) 101   Temp 98.4 F (36.9 C) (Oral)   Wt 224 lb 6.4 oz (101.8 kg)   SpO2 98%   BMI 39.75 kg/m    Physical Exam Constitutional:      Appearance: Normal appearance.     Comments: Raspy voice  HENT:     Head: Normocephalic and atraumatic.     Right Ear: Tympanic membrane normal.     Left Ear: Tympanic membrane normal.     Nose: Nose normal.     Mouth/Throat:     Mouth: Mucous membranes are moist.     Pharynx: No oropharyngeal exudate or posterior oropharyngeal erythema.  Eyes:     Extraocular Movements:  Extraocular movements intact.     Conjunctiva/sclera: Conjunctivae normal.     Pupils: Pupils are equal, round, and reactive to light.  Cardiovascular:     Rate and Rhythm: Normal rate and regular rhythm.     Heart sounds: Normal heart sounds. No murmur heard.    No gallop.  Pulmonary:     Effort: Pulmonary effort is normal.     Breath sounds: No wheezing, rhonchi or rales.     Comments: Dry deep cough Musculoskeletal:        General: Normal range of motion.  Lymphadenopathy:     Cervical: No cervical adenopathy.  Skin:    General: Skin is warm and dry.  Neurological:     Mental Status: She is alert and oriented to person, place, and time.     No results found for any visits on 04/30/22.      Assessment & Plan:   Problem List Items Addressed This Visit   None Visit Diagnoses     Bronchitis    -  Primary   Relevant Medications   albuterol (PROAIR HFA) 108 (90 Base) MCG/ACT inhaler   amoxicillin (AMOXIL) 875 MG tablet   Other Relevant Orders   DG Chest 2 View   Laryngitis           Meds ordered this  encounter  Medications   albuterol (PROAIR HFA) 108 (90 Base) MCG/ACT inhaler    Sig: Inhale 2 puffs into the lungs every 6 (six) hours as needed for wheezing or shortness of breath.    Dispense:  1 each    Refill:  1   amoxicillin (AMOXIL) 875 MG tablet    Sig: Take 1 tablet (875 mg total) by mouth 2 (two) times daily for 7 days.    Dispense:  14 tablet    Refill:  0   Ongoing cough likely 2/2 bronchitis.  Given duration of symptoms obtain CXR and start antibiotic.  Continue albuterol inhaler as needed for wheezing, SOB.  Continue supportive care for laryngitis.  Vocal rest.  Finish steroid burst.  Given strict precautions.  Return if symptoms worsen or fail to improve.  Billie Ruddy, MD

## 2022-05-01 ENCOUNTER — Telehealth: Payer: 59 | Admitting: Nurse Practitioner

## 2022-05-01 ENCOUNTER — Telehealth: Payer: Self-pay | Admitting: Internal Medicine

## 2022-05-01 ENCOUNTER — Other Ambulatory Visit: Payer: Self-pay | Admitting: Family Medicine

## 2022-05-01 DIAGNOSIS — J40 Bronchitis, not specified as acute or chronic: Secondary | ICD-10-CM

## 2022-05-01 MED ORDER — PROMETHAZINE-DM 6.25-15 MG/5ML PO SYRP: 5.0000 mL | ORAL_SOLUTION | Freq: Four times a day (QID) | ORAL | 0 refills | Status: AC | PRN

## 2022-05-01 MED ORDER — BENZONATATE 200 MG PO CAPS: 200.0000 mg | ORAL_CAPSULE | Freq: Two times a day (BID) | ORAL | 0 refills | Status: DC | PRN

## 2022-05-01 NOTE — Progress Notes (Signed)
We are sorry that you are not feeling well.  Here is how we plan to help!  After reviewing your chart we recommend: Continue Albuterol inhaler  Continue antibiotics as prescribed (be sure to take with food) Finish Prednisone as directed (be sure to take with food)  Stop over the counter cough medication, stop benzonatate  You may use the following as needed for your cough:  Meds ordered this encounter  Medications   promethazine-dextromethorphan (PROMETHAZINE-DM) 6.25-15 MG/5ML syrup    Sig: Take 5 mLs by mouth 4 (four) times daily as needed for up to 6 days for cough.    Dispense:  118 mL    Refill:  0     We cannot prescribe any narcotics or controlled substances within eVisits.  This includes cough medications that contain codeine.     USE OF BRONCHODILATOR ("RESCUE") INHALERS: There is a risk from using your bronchodilator too frequently.  The risk is that over-reliance on a medication which only relaxes the muscles surrounding the breathing tubes can reduce the effectiveness of medications prescribed to reduce swelling and congestion of the tubes themselves.  Although you feel brief relief from the bronchodilator inhaler, your asthma may actually be worsening with the tubes becoming more swollen and filled with mucus.  This can delay other crucial treatments, such as oral steroid medications. If you need to use a bronchodilator inhaler daily, several times per day, you should discuss this with your provider.  There are probably better treatments that could be used to keep your asthma under control.     HOME CARE Only take medications as instructed by your medical team. Complete the entire course of an antibiotic. Drink plenty of fluids and get plenty of rest. Avoid close contacts especially the very young and the elderly Cover your mouth if you cough or cough into your sleeve. Always remember to wash your hands A steam or ultrasonic humidifier can help congestion.   GET HELP  RIGHT AWAY IF: You develop worsening fever. You become short of breath You cough up blood. Your symptoms persist after you have completed your treatment plan MAKE SURE YOU  Understand these instructions. Will watch your condition. Will get help right away if you are not doing well or get worse.    Thank you for choosing an e-visit.  Your e-visit answers were reviewed by a board certified advanced clinical practitioner to complete your personal care plan. Depending upon the condition, your plan could have included both over the counter or prescription medications.  Please review your pharmacy choice. Make sure the pharmacy is open so you can pick up prescription now. If there is a problem, you may contact your provider through [employer] and have the prescription routed to another pharmacy.  Your safety is important to [name]. If you have drug allergies check your prescription carefully.   For the next 24 hours you can use MyChart to ask questions about today's visit, request a non-urgent call back, or ask for a work or school excuse. You will get an email in the next two days asking about your experience. I hope that your e-visit has been valuable and will speed your recovery.   I spent approximately 5 minutes reviewing the patient's history, current symptoms and coordinating their care today.

## 2022-05-01 NOTE — Telephone Encounter (Signed)
Rx for Tessalon sent to pharmacy.

## 2022-05-01 NOTE — Telephone Encounter (Signed)
Pt was seen yesterday by dr banks and forgot to ask for cough medication . Pt dx with bronchitis [id number] - [place], [place] - [address] Phone: [phone]  Fax: [phone]

## 2022-05-04 NOTE — Telephone Encounter (Signed)
Spoke to patient to follow up on the Rx sent. Pt reports she did pick up the rx and taken it. It made her feels sick-vomitting. She had an E-visit and they prescribe her phenergan cough syrup. And states she is doing fine. No further action needed.

## 2022-05-08 ENCOUNTER — Other Ambulatory Visit (HOSPITAL_COMMUNITY): Payer: Self-pay | Admitting: Psychiatry

## 2022-05-08 DIAGNOSIS — F419 Anxiety disorder, unspecified: Secondary | ICD-10-CM

## 2022-05-08 DIAGNOSIS — F39 Unspecified mood [affective] disorder: Secondary | ICD-10-CM

## 2022-05-09 DIAGNOSIS — F330 Major depressive disorder, recurrent, mild: Secondary | ICD-10-CM | POA: Diagnosis not present

## 2022-05-12 ENCOUNTER — Other Ambulatory Visit (HOSPITAL_COMMUNITY): Payer: Self-pay | Admitting: Psychiatry

## 2022-05-12 DIAGNOSIS — F39 Unspecified mood [affective] disorder: Secondary | ICD-10-CM

## 2022-05-12 DIAGNOSIS — F419 Anxiety disorder, unspecified: Secondary | ICD-10-CM

## 2022-05-15 ENCOUNTER — Encounter (HOSPITAL_COMMUNITY): Payer: Self-pay | Admitting: Psychiatry

## 2022-05-15 ENCOUNTER — Telehealth (HOSPITAL_COMMUNITY): Payer: 59 | Admitting: Psychiatry

## 2022-05-15 DIAGNOSIS — F39 Unspecified mood [affective] disorder: Secondary | ICD-10-CM

## 2022-05-15 DIAGNOSIS — F419 Anxiety disorder, unspecified: Secondary | ICD-10-CM

## 2022-05-15 MED ORDER — LAMOTRIGINE 25 MG PO TABS: ORAL_TABLET | ORAL | 0 refills | Status: DC

## 2022-05-15 MED ORDER — BUSPIRONE HCL 15 MG PO TABS: 15.0000 mg | ORAL_TABLET | Freq: Two times a day (BID) | ORAL | 0 refills | Status: DC

## 2022-05-15 MED ORDER — SERTRALINE HCL 50 MG PO TABS: 50.0000 mg | ORAL_TABLET | Freq: Every day | ORAL | 0 refills | Status: DC

## 2022-05-15 NOTE — Progress Notes (Signed)
Virtual Visit via Video Note  I connected with Sheri [name] on 05/15/22 at  8:40 AM EST by a video enabled telemedicine application and verified that I am speaking with the correct person using two identifiers.  Location: Patient: Work Provider: Biomedical scientist   I discussed the limitations of evaluation and management by telemedicine and the availability of in person appointments. The patient expressed understanding and agreed to proceed.  History of Present Illness: Patient is evaluated by video session.  She is doing well on the Lamictal, BuSpar and Zoloft.  She denies any mania, psychosis or any impulsive behavior.  She feels her mood is stable and she really liked the current dose of the medication.  She is in therapy with Claiborne Billings for EMDR.  She denies any nightmares or flashback.  Her sleep is good.  She had a good Christmas and holidays with the family.  She admitted some anxiety related to her job as current [employer] and in September and she has to find a new job.  Patient is working as a Economist and work with disability people.  Patient has no rash, itching, tremors or shakes.  Her appetite is okay.  Her weight is unchanged from the past.  She tried to walk with her husband and considering to go back to [facility] to help with weight loss but she did in the past with good results.  Patient lives with her husband, 67 year old stepchild and 26-year-old child.  She denies drinking or using any illegal substances.  She denies any panic attack and like to keep the current medication.   Past Psychiatric History: H/O depression, anxiety, Bipolar, post partum and ADHD. On medication since age 53.  H/O addiction with opiates, Benzo and Adderall. Received therapy and medications at [facility] but not happy with care. Had rehab at [facility] in 2015.  Did well for 5 years without medication until symptoms returns. H/O suicidal thoughts but no attempt.  No psychosis.  Tried Celexa caused dizziness, trazodone, weight gain.  Took Lamictal, Klonopin, Lexapro, hydroxyzine, Ambien, Seroquel, Latuda, Hydroxyzine and Wellbutrin.      Psychiatric Specialty Exam: Physical Exam  Review of Systems  Weight 224 lb (101.6 kg), currently breastfeeding.There is no height or weight on file to calculate BMI.  General Appearance: Casual  Eye Contact:  Good  Speech:  Clear and Coherent and Normal Rate  Volume:  Normal  Mood:  Euthymic  Affect:  Appropriate  Thought Process:  Goal Directed  Orientation:  Full (Time, Place, and Person)  Thought Content:  Logical  Suicidal Thoughts:  No  Homicidal Thoughts:  No  Memory:  Immediate;   Good Recent;   Good Remote;   Good  Judgement:  Good  Insight:  Good  Psychomotor Activity:  Normal  Concentration:  Concentration: Good and Attention Span: Good  Recall:  Good  Fund of Knowledge:  Good  Language:  Good  Akathisia:  No  Handed:  Right  AIMS (if indicated):     Assets:  Communication Skills [employer] for [employer] Talents/Skills Transportation  ADL's:  Intact  Cognition:  WNL  Sleep:   ok     Assessment and Plan: Anxiety.  Bipolar disorder type I.  Patient is stable on her current medication.  Continue Lamictal 25 mg 2 times a day, BuSpar 15 mg 2 times a day and Zoloft 50 mg daily.  We talk about optimizing the Lamictal but patient feel comfortable at current  dose.  Encouraged to continue therapy with Claib[facility] Billings at [facility] for EMDR.  Recommend to call us back if is any question or any concern.  Follow-up in 3 months.  Follow Up Instructions:    I discussed the assessment and treatment plan with the patient. The patient was provided an opportunity to ask questions and all were answered. The patient agreed with the plan and demonstrated an understanding of the instructions.   The patient was advised to call back or seek an in-person evaluation if the symptoms  worsen or if the condition fails to improve as anticipated.  Collaboration of Care: Other provider involved in patient's care AEB notes are available in epic to review  Patient/Guardian was advised Release of Information must be obtained prior to any record release in order to collaborate their care with an outside provider. Patient/Guardian was advised if they have not already done so to contact the registration department to sign all necessary forms in order for [name] to release information regarding their care.   Consent: Patient/Guardian gives verbal consent for treatment and assignment of benefits for services provided during this visit. Patient/Guardian expressed understanding and agreed to proceed.    I provided 20 minutes of non-face-to-face time during this encounter.   Kathlee Nations, MD

## 2022-05-16 DIAGNOSIS — F330 Major depressive disorder, recurrent, mild: Secondary | ICD-10-CM | POA: Diagnosis not present

## 2022-05-20 ENCOUNTER — Telehealth (HOSPITAL_COMMUNITY): Payer: Self-pay | Admitting: *Deleted

## 2022-05-20 ENCOUNTER — Other Ambulatory Visit (HOSPITAL_COMMUNITY): Payer: Self-pay | Admitting: *Deleted

## 2022-05-20 DIAGNOSIS — F419 Anxiety disorder, unspecified: Secondary | ICD-10-CM

## 2022-05-20 DIAGNOSIS — F39 Unspecified mood [affective] disorder: Secondary | ICD-10-CM

## 2022-05-20 MED ORDER — LAMOTRIGINE 25 MG PO TABS: ORAL_TABLET | ORAL | 0 refills | Status: DC

## 2022-05-20 NOTE — Telephone Encounter (Signed)
Done

## 2022-05-20 NOTE — Telephone Encounter (Signed)
Yes she can try Lamictal 3 times a day.  Please call her pharmacy.

## 2022-05-20 NOTE — Telephone Encounter (Signed)
Pt called requesting an increase in Lamictal as discussed in last visit on 05/15/22. At that time she did not want to increase dose but is now interested in increasing. Pt is currently on Lamictal 25 mg BID. Pt was at work so was not able to completely answer writers questions but did endorse increase in irritability. Pt denies increased depression or decreased sleep, primarily increase in irritability. Pt next scheduled appointment is on 08/14/22. Please review and advise.

## 2022-05-25 DIAGNOSIS — R69 Illness, unspecified: Secondary | ICD-10-CM | POA: Diagnosis not present

## 2022-05-28 NOTE — Progress Notes (Signed)
Sheri [name] [address] [place] Phone: [phone] Subjective:   Sheri [name], am serving as a scribe for Dr. Hulan Saas.  I'm seeing this patient by the request  of:  Panosh, Standley Brooking, MD  CC: Upper back pain  [id number]  Sheri [name] is a 32 y.o. female coming in with complaint of back and neck pain. OMT 08/08/2021. Patient states wants manipulation. Would like exercises for better posture and core.  Patient does have tightness noted.  States that it is affecting all daily activities.  Medications patient has been prescribed: None  Taking:         Reviewed prior external information including notes and imaging from previsou exam, outside providers and external EMR if available.   As well as notes that were available from care everywhere and other healthcare systems.  Past medical history, social, surgical and family history all reviewed in electronic medical record.  No pertanent information unless stated regarding to the chief complaint.   Past Medical History:  Diagnosis Date   Alcohol addiction ([place])    Anxiety    Bipolar depression ([place])    under psych rx    Depression    Drug addiction in remission [facility])    heroin   Enteritis due to Norovirus 05/18/2016   Hx of pulmonary embolus 02/13/2015   Hyperlipidemia    Hypokalemia 05/17/2016   IUD (intrauterine device) in place 02/13/2015   Mild mitral regurgitation    Mild tricuspid regurgitation    Mitral regurgitation 06/13/2018   Near syncope 05/16/2016   a. felt due to norovirus.   Nonallopathic lesion of cervical region 09/01/2018   Nonallopathic lesion of rib cage 09/01/2018   Obesity    Orthostatic hypotension 05/18/2016   PCOS (polycystic ovarian syndrome) 02/13/2015   Pulmonary embolism ([place]) 2011   neg heme evaluation  felt to be from ocps    Scapular dyskinesis 09/01/2018   Smoker 12/30/2016   Syncope and collapse 05/16/2016   Thoracic outlet  syndrome of left thoracic outlet 02/15/2018   Thoracic radiculopathy due to degenerative joint disease of spine 10/13/2016   CT and XR show at least 3 levels of DDD of thoracic spine  I suspect this is related to both the scoliosis and the history of dance    Allergies  Allergen Reactions   Bupropion Other (See Comments)     Review of Systems:  No headache, visual changes, nausea, vomiting, diarrhea, constipation, dizziness, abdominal pain, skin rash, fevers, chills, night sweats, weight loss, swollen lymph nodes, body aches, joint swelling, chest pain, shortness of breath, mood changes. POSITIVE muscle aches  Objective  Blood pressure 116/82, pulse 90, height 5' 3"$  (1.6 m), weight 220 lb (99.8 kg), SpO2 98 %, currently breastfeeding.   General: No apparent distress alert and oriented x3 mood and affect normal, dressed appropriately.  HEENT: Pupils equal, extraocular movements intact  Respiratory: Patient's speak in full sentences and does not appear short of breath  Cardiovascular: No lower extremity edema, non tender, no erythema  Severe tightness noted in the paraspinal musculature of the scapular area.  Scapular dyskinesis noted on the right compared to left.  Patient does have some impingement noted with Neer and Hawkins.  Osteopathic findings  C2 flexed rotated and side bent right C6 flexed rotated and side bent left T3 extended rotated and side bent right inhaled rib T9 extended rotated and side bent left L2 flexed rotated and side bent right Sacrum  right on right       Assessment and Plan:  Scapular dyskinesis Chronic problem with worsening symptoms, Zanaflex 4 mg given secondary to the severity.  Depo-Medrol and Toradol injections given today secondary to the limitation of even range of motion.  Attempted osteopathic manipulation with very minimal improvement noted as well today.  Discussed home exercises, ergonomics, icing regimen and home exercises.  Follow-up with me  again in 4 to 5 weeks.    Nonallopathic problems  Decision today to treat with OMT was based on Physical Exam  After verbal consent patient was treated with HVLA, ME, FPR techniques in cervical, rib, thoracic, lumbar, and sacral  areas  Patient tolerated the procedure well with improvement in symptoms  Patient given exercises, stretches and lifestyle modifications  See medications in patient instructions if given  Patient will follow up in 4-8 weeks    The above documentation has been reviewed and is accurate and complete Lyndal Pulley, DO          Note: This dictation was prepared with Dragon dictation along with smaller phrase technology. Any transcriptional errors that result from this process are unintentional.

## 2022-05-29 ENCOUNTER — Encounter: Payer: Self-pay | Admitting: Family Medicine

## 2022-05-29 ENCOUNTER — Ambulatory Visit (INDEPENDENT_AMBULATORY_CARE_PROVIDER_SITE_OTHER): Payer: 59 | Admitting: Family Medicine

## 2022-05-29 VITALS — BP 116/82 | HR 90 | Ht 63.0 in | Wt 220.0 lb

## 2022-05-29 DIAGNOSIS — M9903 Segmental and somatic dysfunction of lumbar region: Secondary | ICD-10-CM | POA: Diagnosis not present

## 2022-05-29 DIAGNOSIS — M9908 Segmental and somatic dysfunction of rib cage: Secondary | ICD-10-CM

## 2022-05-29 DIAGNOSIS — M9901 Segmental and somatic dysfunction of cervical region: Secondary | ICD-10-CM

## 2022-05-29 DIAGNOSIS — M9904 Segmental and somatic dysfunction of sacral region: Secondary | ICD-10-CM

## 2022-05-29 DIAGNOSIS — F330 Major depressive disorder, recurrent, mild: Secondary | ICD-10-CM | POA: Diagnosis not present

## 2022-05-29 DIAGNOSIS — G2589 Other specified extrapyramidal and movement disorders: Secondary | ICD-10-CM

## 2022-05-29 DIAGNOSIS — M9902 Segmental and somatic dysfunction of thoracic region: Secondary | ICD-10-CM | POA: Diagnosis not present

## 2022-05-29 MED ORDER — TIZANIDINE HCL 4 MG PO TABS: 4.0000 mg | ORAL_TABLET | Freq: Every day | ORAL | 0 refills | Status: DC

## 2022-05-29 MED ORDER — METHYLPREDNISOLONE ACETATE 80 MG/ML IJ SUSP
80.0000 mg | Freq: Once | INTRAMUSCULAR | Status: AC
  Administered 2022-05-29: 80 mg via INTRAMUSCULAR

## 2022-05-29 MED ORDER — KETOROLAC TROMETHAMINE 60 MG/2ML IM SOLN
60.0000 mg | Freq: Once | INTRAMUSCULAR | Status: AC
  Administered 2022-05-29: 60 mg via INTRAMUSCULAR

## 2022-05-29 NOTE — Assessment & Plan Note (Signed)
Chronic problem with worsening symptoms, Zanaflex 4 mg given secondary to the severity.  Depo-Medrol and Toradol injections given today secondary to the limitation of even range of motion.  Attempted osteopathic manipulation with very minimal improvement noted as well today.  Discussed home exercises, ergonomics, icing regimen and home exercises.  Follow-up with me again in 4 to 5 weeks.

## 2022-05-29 NOTE — Patient Instructions (Addendum)
Do prescribed exercises at least 3x a week Good to see you! Monitor at eye level Zanaflex 45m prescribed See you again in 4-5 weeks

## 2022-06-06 DIAGNOSIS — F330 Major depressive disorder, recurrent, mild: Secondary | ICD-10-CM | POA: Diagnosis not present

## 2022-06-08 DIAGNOSIS — R69 Illness, unspecified: Secondary | ICD-10-CM | POA: Diagnosis not present

## 2022-06-15 DIAGNOSIS — R69 Illness, unspecified: Secondary | ICD-10-CM | POA: Diagnosis not present

## 2022-06-20 ENCOUNTER — Other Ambulatory Visit: Payer: Self-pay | Admitting: Family Medicine

## 2022-06-20 DIAGNOSIS — F330 Major depressive disorder, recurrent, mild: Secondary | ICD-10-CM | POA: Diagnosis not present

## 2022-06-22 DIAGNOSIS — F4320 Adjustment disorder, unspecified: Secondary | ICD-10-CM | POA: Diagnosis not present

## 2022-06-25 NOTE — Progress Notes (Signed)
Sheri Sheri [name] Phone: [phone] Subjective:   Sheri Sheri [name], am serving as a scribe for Dr. Hulan [name].  I'm seeing this patient by the request  of:  Panosh, Standley Brooking, MD  CC: Back and neck pain follow-up  [id number]  Sheri Sheri [name] is a 32 y.o. female coming in with complaint of back and neck pain. OMT 05/29/2022. Patient states that she is doing better since last visit. Used mm rlaxer for 2 days after last visit.   Medications patient has been prescribed: Zanaflex  Taking: Intermittently         Reviewed prior external information including notes and imaging from previsou exam, outside providers and external EMR if available.   As well as notes that were available from care everywhere and other healthcare systems.  Past medical history, social, surgical and family history all reviewed in electronic medical record.  Sheri [name] unless stated regarding to the chief complaint.   Past Medical History:  Diagnosis Date   Alcohol addiction ([place])    Anxiety    Bipolar depression ([place])    under psych rx    Depression    Drug addiction in remission [facility])    heroin   Enteritis due to Norovirus 05/18/2016   Hx of pulmonary embolus 02/13/2015   Hyperlipidemia    Hypokalemia 05/17/2016   IUD (intrauterine device) in place 02/13/2015   Mild mitral regurgitation    Mild tricuspid regurgitation    Mitral regurgitation 06/13/2018   Near syncope 05/16/2016   a. felt due to norovirus.   Nonallopathic lesion of cervical region 09/01/2018   Nonallopathic lesion of rib cage 09/01/2018   Obesity    Orthostatic hypotension 05/18/2016   PCOS (polycystic ovarian syndrome) 02/13/2015   Pulmonary embolism ([place]) 2011   neg heme evaluation  felt to be from ocps    Scapular dyskinesis 09/01/2018   Smoker 12/30/2016   Syncope and collapse 05/16/2016   Thoracic outlet syndrome of left thoracic outlet  02/15/2018   Thoracic radiculopathy due to degenerative joint disease of spine 10/13/2016   CT and XR show at least 3 levels of DDD of thoracic spine  I suspect this is related to both the scoliosis and the history of dance    Allergies  Allergen Reactions   Bupropion Other (See Comments)     Review of Systems:  Sheri [name], visual changes, nausea, vomiting, diarrhea, constipation, dizziness, abdominal pain, skin rash, fevers, chills, night sweats, weight loss, swollen lymph nodes, body aches, joint swelling, chest pain, shortness of breath, mood changes. POSITIVE muscle aches  Objective  Blood pressure 110/72, pulse 74, height 5\' 3"  (1.6 m), weight 219 lb (99.3 kg), SpO2 99 %, currently breastfeeding.   General: Sheri [name] apparent distress alert and oriented x3 mood and affect normal, dressed appropriately.  HEENT: Pupils equal, extraocular movements intact  Respiratory: Patient's speak in full sentences and does not appear short of breath  Cardiovascular: Sheri [name], non tender, Sheri [name]  Overweight, back exam does have some loss lordosis.  Tightness still noted in the parascapular region.  Osteopathic findings  C2 flexed rotated and side bent right C5 flexed rotated and side bent left T3 extended rotated and side bent right inhaled rib T11 extended rotated and side bent left L1 flexed rotated and side bent right Sacrum right on right       Assessment and Plan:  Scapular dyskinesis Tightness noted in the scapulas  bilaterally.  This is contributing to some of the thoracic spine pain and the back pain.  Likely Sheri [name] significant trigger points noted again today.  Does have Zanaflex for breakthrough pain.  Discussed with patient about posture and ergonomics.  Still caring for her young infant.  Follow-up again in 6 to 8 weeks    Nonallopathic problems  Decision today to treat with OMT was based on Physical Exam  After verbal consent patient was treated with HVLA, ME,  FPR techniques in cervical, rib, thoracic, lumbar, and sacral  areas  Patient tolerated the procedure well with improvement in symptoms  Patient given exercises, stretches and lifestyle modifications  See medications in patient instructions if given  Patient will follow up in 4-8 weeks     The above documentation has been reviewed and is accurate and complete Sheri Pulley, DO         Note: This dictation was prepared with Dragon dictation along with smaller phrase technology. Any transcriptional errors that result from this process are unintentional.

## 2022-06-26 ENCOUNTER — Encounter: Payer: Self-pay | Admitting: Family Medicine

## 2022-06-26 ENCOUNTER — Ambulatory Visit: Payer: 59 | Admitting: Family Medicine

## 2022-06-26 VITALS — BP 110/72 | HR 74 | Ht 63.0 in | Wt 219.0 lb

## 2022-06-26 DIAGNOSIS — M9902 Segmental and somatic dysfunction of thoracic region: Secondary | ICD-10-CM

## 2022-06-26 DIAGNOSIS — M9904 Segmental and somatic dysfunction of sacral region: Secondary | ICD-10-CM | POA: Diagnosis not present

## 2022-06-26 DIAGNOSIS — M9901 Segmental and somatic dysfunction of cervical region: Secondary | ICD-10-CM | POA: Diagnosis not present

## 2022-06-26 DIAGNOSIS — M9903 Segmental and somatic dysfunction of lumbar region: Secondary | ICD-10-CM | POA: Diagnosis not present

## 2022-06-26 DIAGNOSIS — M9908 Segmental and somatic dysfunction of rib cage: Secondary | ICD-10-CM | POA: Diagnosis not present

## 2022-06-26 DIAGNOSIS — G2589 Other specified extrapyramidal and movement disorders: Secondary | ICD-10-CM | POA: Diagnosis not present

## 2022-06-26 NOTE — Patient Instructions (Addendum)
Glad you are better Exercises See me again in 7-8  weeks

## 2022-06-26 NOTE — Assessment & Plan Note (Signed)
Tightness noted in the scapulas bilaterally.  This is contributing to some of the thoracic spine pain and the back pain.  Likely no significant trigger points noted again today.  Does have Zanaflex for breakthrough pain.  Discussed with patient about posture and ergonomics.  Still caring for her young infant.  Follow-up again in 6 to 8 weeks

## 2022-06-29 DIAGNOSIS — F4320 Adjustment disorder, unspecified: Secondary | ICD-10-CM | POA: Diagnosis not present

## 2022-07-03 DIAGNOSIS — F330 Major depressive disorder, recurrent, mild: Secondary | ICD-10-CM | POA: Diagnosis not present

## 2022-07-18 DIAGNOSIS — F330 Major depressive disorder, recurrent, mild: Secondary | ICD-10-CM | POA: Diagnosis not present

## 2022-07-20 DIAGNOSIS — F4320 Adjustment disorder, unspecified: Secondary | ICD-10-CM | POA: Diagnosis not present

## 2022-07-25 DIAGNOSIS — F330 Major depressive disorder, recurrent, mild: Secondary | ICD-10-CM | POA: Diagnosis not present

## 2022-07-27 DIAGNOSIS — F4320 Adjustment disorder, unspecified: Secondary | ICD-10-CM | POA: Diagnosis not present

## 2022-08-01 DIAGNOSIS — F330 Major depressive disorder, recurrent, mild: Secondary | ICD-10-CM | POA: Diagnosis not present

## 2022-08-03 DIAGNOSIS — F4320 Adjustment disorder, unspecified: Secondary | ICD-10-CM | POA: Diagnosis not present

## 2022-08-11 DIAGNOSIS — F4320 Adjustment disorder, unspecified: Secondary | ICD-10-CM | POA: Diagnosis not present

## 2022-08-12 ENCOUNTER — Other Ambulatory Visit (HOSPITAL_COMMUNITY): Payer: Self-pay | Admitting: Psychiatry

## 2022-08-12 DIAGNOSIS — F39 Unspecified mood [affective] disorder: Secondary | ICD-10-CM

## 2022-08-12 DIAGNOSIS — F419 Anxiety disorder, unspecified: Secondary | ICD-10-CM

## 2022-08-14 ENCOUNTER — Telehealth (HOSPITAL_COMMUNITY): Payer: 59 | Admitting: Psychiatry

## 2022-08-14 ENCOUNTER — Encounter (HOSPITAL_COMMUNITY): Payer: Self-pay | Admitting: Psychiatry

## 2022-08-14 DIAGNOSIS — F39 Unspecified mood [affective] disorder: Secondary | ICD-10-CM

## 2022-08-14 DIAGNOSIS — F419 Anxiety disorder, unspecified: Secondary | ICD-10-CM

## 2022-08-14 MED ORDER — SERTRALINE HCL 50 MG PO TABS: 50.0000 mg | ORAL_TABLET | Freq: Every day | ORAL | 0 refills | Status: DC

## 2022-08-14 MED ORDER — LAMOTRIGINE 25 MG PO TABS
ORAL_TABLET | ORAL | 0 refills | Status: DC
Start: 2022-08-14 — End: 2022-11-13

## 2022-08-14 MED ORDER — BUSPIRONE HCL 15 MG PO TABS: 15.0000 mg | ORAL_TABLET | Freq: Two times a day (BID) | ORAL | 0 refills | Status: DC

## 2022-08-14 NOTE — Progress Notes (Signed)
[facility] MD Virtual Progress Note   Patient Location: In Car Provider Location: Home Office  I connect with patient by Video and verified that I am speaking with correct person by using two identifiers. I discussed the limitations of evaluation and management by telemedicine and the availability of in person appointments. I also discussed with the patient that there may be a patient responsible charge related to this service. The patient expressed understanding and agreed to proceed.  Sheri [name] [id number] 32 y.o.  08/14/2022 9:15 AM  History of Present Illness:  Patient is evaluated by video session.  She is taking now Lamictal 75 mg daily along with Zoloft and BuSpar.  She noticed much improvement in her mood and impulsive behavior.  She does get sometimes irritable and anger once in a while but therapy is helping.  She is in therapy with Tresa Endo for EMDR and that is helping her a lot.  She also started wellness program to address her weight.  She is also doing NOOM better around her cycle not consistent with the diet.  She feels that she reach at the plateau and needs some help and hoping that wellness program is going to help her.  In the past she used to take the metformin for her PCO but that has been discontinued.  Overall she reported things are better.  She denies any anger, crying spells, feeling of hopelessness or worthlessness.  Anxiety is also improved.  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  She like her job.  She lives with her husband and 31 year old stepchild and 73-year-old child.  She denies drinking or using any illegal substances.  She has no tremors, shakes, rash or any itching.  She noticed much improvement with the adjustment of Lamictal.  She started walking on a regular basis and that helps her a lot.  Past Psychiatric History: H/O depression, anxiety, Bipolar, post partum and ADHD. On medication since  age 68.  H/O addiction with opiates, Benzo and Adderall. Received therapy and medications at [facility] counseling and [facility] but not happy with care. Had rehab at [facility] in 2015.  Did well for 5 years without medication until symptoms returns. H/O suicidal thoughts but no attempt. No psychosis.  Tried Celexa caused dizziness, trazodone, weight gain.  Took Lamictal, Klonopin, Lexapro, hydroxyzine, Ambien, Seroquel, Latuda, Hydroxyzine and Wellbutrin.        Outpatient Encounter Medications as of 08/14/2022  Medication Sig   albuterol (PROAIR HFA) 108 (90 Base) MCG/ACT inhaler Inhale 2 puffs into the lungs every 6 (six) hours as needed for wheezing or shortness of breath.   aspirin EC 81 MG tablet Take 81 mg by mouth daily. Swallow whole.   busPIRone (BUSPAR) 15 MG tablet Take 1 tablet (15 mg total) by mouth 2 (two) times daily.   cetirizine (ZYRTEC) 10 MG tablet Take 10 mg by mouth daily.   lamoTRIgine (LAMICTAL) 25 MG tablet Take one tablet by mouth 3 times daily.   sertraline (ZOLOFT) 50 MG tablet Take 1 tablet (50 mg total) by mouth daily.   SUMAtriptan (IMITREX) 25 MG tablet TAKE 1 TABLET BY MOUTH AT START OF MIGRAINE.   tiZANidine (ZANAFLEX) 4 MG tablet TAKE 1 TABLET BY MOUTH AT BEDTIME.   No facility-administered encounter medications on file as of 08/14/2022.    No results found for this or any previous visit (from the past 2160 hour(s)).   Psychiatric Specialty Exam: Physical Exam  Review of Systems  Weight 225 lb (102.1 kg), currently breastfeeding.There is no height or weight on file to calculate BMI.  General Appearance: Casual  Eye Contact:  Good  Speech:  Clear and Coherent  Volume:  Normal  Mood:  Euthymic  Affect:  Appropriate  Thought Process:  Goal Directed  Orientation:  Full (Time, Place, and Person)  Thought Content:  Logical  Suicidal Thoughts:  No  Homicidal Thoughts:  No  Memory:  Immediate;   Good Recent;   Good Remote;   Good   Judgement:  Good  Insight:  Good  Psychomotor Activity:  Normal  Concentration:  Concentration: Good and Attention Span: Good  Recall:  Good  Fund of Knowledge:  Good  Language:  Good  Akathisia:  No  Handed:  Right  AIMS (if indicated):     Assets:  Communication Skills Desire for Improvement Housing Resilience Social Support Talents/Skills Transportation  ADL's:  Intact  Cognition:  WNL  Sleep:  better     Assessment/Plan: Mood disorder (HCC) - Plan: sertraline (ZOLOFT) 50 MG tablet, lamoTRIgine (LAMICTAL) 25 MG tablet  Anxiety - Plan: sertraline (ZOLOFT) 50 MG tablet, busPIRone (BUSPAR) 15 MG tablet, lamoTRIgine (LAMICTAL) 25 MG tablet  Patient doing very well on her medication other than episodic irritability and anger.  I discussed in detail about optimizing the dose of Lamictal pros and cons.  So far she has no rash, itching, tremors or shakes.  She is in therapy with [facility] for [facility] and that is going well.  She also going to start wellness program and she is happy about it.  I recommend should consider Lamictal 100 mg to help her episodic irritability and she is agree but I also recommend if for some reason she like to go back to 75 mg and she can keep it the 75 mg dose.  So far she has no major concern.  We will try Lamictal 50 mg 2 times a day, continue Zoloft 50 mg daily and BuSpar 15 mg 2 times a day.  Recommended to call us back if she is any question or any concern.  Follow-up in 3 months.   Follow Up Instructions:     I discussed the assessment and treatment plan with the patient. The patient was provided an opportunity to ask questions and all were answered. The patient agreed with the plan and demonstrated an understanding of the instructions.   The patient was advised to call back or seek an in-person evaluation if the symptoms worsen or if the condition fails to improve as anticipated.    Collaboration of Care: Other provider involved in  patient's care AEB notes are available in epic to review.  Patient/Guardian was advised Release of Information must be obtained prior to any record release in order to collaborate their care with an outside provider. Patient/Guardian was advised if they have not already done so to contact the registration department to sign all necessary forms in order for [name] to release information regarding their care.   Consent: Patient/Guardian gives verbal consent for treatment and assignment of benefits for services provided during this visit. Patient/Guardian expressed understanding and agreed to proceed.     I provided 25 minutes of non face to face time during this encounter.  Note: This document was prepared by [employer] voice dictation technology and any errors that results from this process are unintentional.    Cleotis Nipper, MD 08/14/2022

## 2022-08-17 DIAGNOSIS — F4320 Adjustment disorder, unspecified: Secondary | ICD-10-CM | POA: Diagnosis not present

## 2022-08-18 DIAGNOSIS — Z0289 Encounter for other administrative examinations: Secondary | ICD-10-CM

## 2022-08-19 ENCOUNTER — Encounter (INDEPENDENT_AMBULATORY_CARE_PROVIDER_SITE_OTHER): Payer: Self-pay | Admitting: Family Medicine

## 2022-08-21 NOTE — Progress Notes (Unsigned)
[name] [address] [place] [address] Phone: [phone] Subjective:   [name], am serving as a scribe for Dr. Antoine Primas.  I'm seeing this patient by the request  of:  Panosh, Neta Mends, MD  CC: Back and neck pain  [id number]  Ismerai Zaniya Shinohara is a 32 y.o. female coming in with complaint of back and neck pain. OMT 06/26/2022. Patient states doing well. No new concerns.  Has been taking care of her sick child recently.  Noticed some more discomfort here and there.  Nothing that stopping him from activity just lungs are down some.  Medications patient has been prescribed: Zanaflex  Taking:         Reviewed prior external information including notes and imaging from previsou exam, outside providers and external EMR if available.   As well as notes that were available from care everywhere and other healthcare systems.  Past medical history, social, surgical and family history all reviewed in electronic medical record.  No pertanent information unless stated regarding to the chief complaint.   Past Medical History:  Diagnosis Date   Alcohol addiction ([facility])    Anxiety    Bipolar depression ([facility])    under psych rx    Depression    Drug addiction in remission [facility])    heroin   Enteritis due to Norovirus 05/18/2016   Hx of pulmonary embolus 02/13/2015   Hyperlipidemia    Hypokalemia 05/17/2016   IUD (intrauterine device) in place 02/13/2015   Mild mitral regurgitation    Mild tricuspid regurgitation    Mitral regurgitation 06/13/2018   Near syncope 05/16/2016   a. felt due to norovirus.   Nonallopathic lesion of cervical region 09/01/2018   Nonallopathic lesion of rib cage 09/01/2018   Obesity    Orthostatic hypotension 05/18/2016   PCOS (polycystic ovarian syndrome) 02/13/2015   Pulmonary embolism ([facility]) 2011   neg heme evaluation  felt to be from ocps    Scapular dyskinesis 09/01/2018   Smoker 12/30/2016   Syncope  and collapse 05/16/2016   Thoracic outlet syndrome of left thoracic outlet 02/15/2018   Thoracic radiculopathy due to degenerative joint disease of spine 10/13/2016   CT and XR show at least 3 levels of DDD of thoracic spine  I suspect this is related to both the scoliosis and the history of dance    Allergies  Allergen Reactions   Bupropion Other (See Comments)     Review of Systems:  No headache, visual changes, nausea, vomiting, diarrhea, constipation, dizziness, abdominal pain, skin rash, fevers, chills, night sweats, weight loss, swollen lymph nodes, body aches, joint swelling, chest pain, shortness of breath, mood changes. POSITIVE muscle aches  Objective  Blood pressure 112/74, pulse 83, height 5\' 3"  (1.6 m), weight 217 lb (98.4 kg), SpO2 98 %, currently breastfeeding.   General: No apparent distress alert and oriented x3 mood and affect normal, dressed appropriately.  HEENT: Pupils equal, extraocular movements intact  Respiratory: Patient's speak in full sentences and does not appear short of breath  Cardiovascular: No lower extremity edema, non tender, no erythema  Low back exam does have some loss of lordosis.  Severe tenderness to palpation over the sacroiliac joint.  Seems to be more on the left than the right.  Patient has more tenderness in the right parascapular area.  Osteopathic findings  C5 flexed rotated and side bent right T3 extended rotated and side bent right inhaled rib T9 extended rotated and  side bent left L2 flexed rotated and side bent right Sacrum left on left       Assessment and Plan:  Scapular dyskinesis Continues to have some scapular dyskinesis as well as still the thoracic spine.  Responds extremely well though to osteopathic manipulation and did respond well today after evaluation and treatment.  We discussed exercises, which activities to do and which ones to avoid.  Patient is still having difficulty with weight loss and will start going to a  healthy weight and wellness which I think will be beneficial as well.  Follow-up with me again in 6 to 8 weeks    Nonallopathic problems  Decision today to treat with OMT was based on Physical Exam  After verbal consent patient was treated with HVLA, ME, FPR techniques in cervical, rib, thoracic, lumbar, and sacral  areas  Patient tolerated the procedure well with improvement in symptoms  Patient given exercises, stretches and lifestyle modifications  See medications in patient instructions if given  Patient will follow up in 4-8 weeks    The above documentation has been reviewed and is accurate and complete Judi Saa, DO          Note: This dictation was prepared with Dragon dictation along with smaller phrase technology. Any transcriptional errors that result from this process are unintentional.

## 2022-08-24 ENCOUNTER — Ambulatory Visit: Payer: 59 | Admitting: Family Medicine

## 2022-08-24 ENCOUNTER — Encounter: Payer: Self-pay | Admitting: Family Medicine

## 2022-08-24 VITALS — BP 112/74 | HR 83 | Ht 63.0 in | Wt 217.0 lb

## 2022-08-24 DIAGNOSIS — M9904 Segmental and somatic dysfunction of sacral region: Secondary | ICD-10-CM | POA: Diagnosis not present

## 2022-08-24 DIAGNOSIS — M9903 Segmental and somatic dysfunction of lumbar region: Secondary | ICD-10-CM | POA: Diagnosis not present

## 2022-08-24 DIAGNOSIS — M9901 Segmental and somatic dysfunction of cervical region: Secondary | ICD-10-CM | POA: Diagnosis not present

## 2022-08-24 DIAGNOSIS — G2589 Other specified extrapyramidal and movement disorders: Secondary | ICD-10-CM | POA: Diagnosis not present

## 2022-08-24 DIAGNOSIS — M9902 Segmental and somatic dysfunction of thoracic region: Secondary | ICD-10-CM | POA: Diagnosis not present

## 2022-08-24 DIAGNOSIS — M9908 Segmental and somatic dysfunction of rib cage: Secondary | ICD-10-CM | POA: Diagnosis not present

## 2022-08-24 NOTE — Patient Instructions (Addendum)
Good to see you! Hopefully Healthy weight and wellness will help Keep doing exercises See you again in 6-8 weeks

## 2022-08-24 NOTE — Assessment & Plan Note (Signed)
Continues to have some scapular dyskinesis as well as still the thoracic spine.  Responds extremely well though to osteopathic manipulation and did respond well today after evaluation and treatment.  We discussed exercises, which activities to do and which ones to avoid.  Patient is still having difficulty with weight loss and will start going to a healthy weight and wellness which I think will be beneficial as well.  Follow-up with me again in 6 to 8 weeks

## 2022-08-29 DIAGNOSIS — F330 Major depressive disorder, recurrent, mild: Secondary | ICD-10-CM | POA: Diagnosis not present

## 2022-08-31 DIAGNOSIS — F4320 Adjustment disorder, unspecified: Secondary | ICD-10-CM | POA: Diagnosis not present

## 2022-09-04 ENCOUNTER — Telehealth: Payer: 59 | Admitting: Family Medicine

## 2022-09-04 DIAGNOSIS — J019 Acute sinusitis, unspecified: Secondary | ICD-10-CM

## 2022-09-04 DIAGNOSIS — B9689 Other specified bacterial agents as the cause of diseases classified elsewhere: Secondary | ICD-10-CM

## 2022-09-04 MED ORDER — AMOXICILLIN-POT CLAVULANATE 875-125 MG PO TABS: 1.0000 | ORAL_TABLET | Freq: Two times a day (BID) | ORAL | 0 refills | Status: DC

## 2022-09-04 NOTE — Progress Notes (Signed)

## 2022-09-09 DIAGNOSIS — F4320 Adjustment disorder, unspecified: Secondary | ICD-10-CM | POA: Diagnosis not present

## 2022-09-12 DIAGNOSIS — F330 Major depressive disorder, recurrent, mild: Secondary | ICD-10-CM | POA: Diagnosis not present

## 2022-09-15 ENCOUNTER — Encounter (INDEPENDENT_AMBULATORY_CARE_PROVIDER_SITE_OTHER): Payer: Self-pay | Admitting: Family Medicine

## 2022-09-15 ENCOUNTER — Ambulatory Visit (INDEPENDENT_AMBULATORY_CARE_PROVIDER_SITE_OTHER): Payer: 59 | Admitting: Family Medicine

## 2022-09-15 VITALS — BP 110/75 | HR 81 | Temp 98.2°F | Ht 64.0 in | Wt 214.0 lb

## 2022-09-15 DIAGNOSIS — R5383 Other fatigue: Secondary | ICD-10-CM | POA: Diagnosis not present

## 2022-09-15 DIAGNOSIS — F32A Depression, unspecified: Secondary | ICD-10-CM | POA: Diagnosis not present

## 2022-09-15 DIAGNOSIS — R0602 Shortness of breath: Secondary | ICD-10-CM

## 2022-09-15 DIAGNOSIS — Z86711 Personal history of pulmonary embolism: Secondary | ICD-10-CM

## 2022-09-15 DIAGNOSIS — R7303 Prediabetes: Secondary | ICD-10-CM | POA: Diagnosis not present

## 2022-09-15 DIAGNOSIS — E7800 Pure hypercholesterolemia, unspecified: Secondary | ICD-10-CM

## 2022-09-15 DIAGNOSIS — F419 Anxiety disorder, unspecified: Secondary | ICD-10-CM

## 2022-09-15 DIAGNOSIS — E66812 Obesity, class 2: Secondary | ICD-10-CM

## 2022-09-15 DIAGNOSIS — Z6836 Body mass index (BMI) 36.0-36.9, adult: Secondary | ICD-10-CM

## 2022-09-15 NOTE — Progress Notes (Signed)
Chief Complaint:   OBESITY Sheri [name] (MR# [id number]) is a 32 y.o. female who presents for evaluation and treatment of obesity and related comorbidities. Current BMI is Body mass index is 36.73 kg/m. Sheri [name] has been struggling with her weight for many years and has been unsuccessful in either losing weight, maintaining weight loss, or reaching her healthy weight goal.  Sheri [name] is currently in the action stage of change and ready to dedicate time achieving and maintaining a healthier weight. Sheri [name] is interested in becoming our patient and working on intensive lifestyle modifications including (but not limited to) diet and exercise for weight loss.  Patient found Sheri [name] on google after calling [facility] to do [facility]. Possible plans for future pregnancy in the next year; self reported history of PCOS. Never had regular periods. 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  Works at [employer].  Living with husban Sheri [name], stepson Sheri [name] (12) and daughter Sheri [name] (2). Husband also struggles with weight and is current mentality is "Healthy at any weight" mentality. Desired weight is 150lbs- last time she was that weight was in her mid 19s.   Thinks she started gaining in her recovery and early sobriety- she was on Seroquel and would often wake up surrounded by wrappers. Previously used Noom during COVID at the beginning until she got pregnant. Eats fast food or take out 5-7 times a week, whatever is on [address] and available for breakfast and lunch. Often skips meals- usually breakfast or lunch.  Food recall: Coffee in the am- sugar 1 tsp and 2 tbsp milk. Drinks water all day.  Some am she will grab egg mcmuffin or sandwich from [employer] (egg and cheese).  Eats whole sandwich and feels satisfied.  Normally eats again around 2/3pm bag or two of chips (crunchy kettle chips) or hot sandwich from vietnamese place ([employer] sandwiches) near [employer] or eggplant parm sub from [employer].  Will get sides and appetizer (egg rolls).  Eat all and feel full.  Dinner around 5:30/6 and will eat cheeseburgers or [place] burgers or chicken enchilada or chicken pot pie.  Sheri [name] habits were reviewed today and are as follows: Her family eats meals together, she thinks her family will eat healthier with her, she struggles with family and or coworkers weight loss sabotage, her desired weight loss is 64 lbs, she has been heavy most of her life, she started gaining weight between 2015-2022, her heaviest weight ever was 225 pounds, she has significant food cravings issues, she snacks frequently in the evenings, she skips meals frequently, she is frequently drinking liquids with calories, she frequently makes poor food choices, she frequently eats larger portions than normal, she has binge eating behaviors, and she struggles with emotional eating.  Depression Screen Sheri [name]'s Food and Mood (modified PHQ-9) score was 15.  Subjective:   1. Other fatigue Sheri [name] admits to daytime somnolence and admits to waking up still tired. Patient has a history of symptoms of daytime fatigue and morning fatigue. Sheri [name] gets 6 or 7 hours of sleep per night, and states that she has nightime awakenings. Snoring is present. Apneic episodes are not present. Epworth Sleepiness Score is 3.  EKG-no sinus rhythm.  2. SOBOE (shortness of breath on exertion) Sheri [name] notes increasing shortness of breath with exercising and seems to be worsening over time with weight gain. She notes getting out of breath sooner with activity than she used to.  This has not gotten worse recently. Sheri [name] denies shortness of breath at rest or orthopnea.  3. Prediabetes Patient's A1c was 6.3 in September 2023.  Previously on metformin for 15 years and stopped after having her baby.  4. History of pulmonary embolism Patient is on baby aspirin.   Previously on Lovenox during pregnancy.  Thought to be hormones and birth control.  5. Pure hypercholesterolemia Patient's LDL 2 years ago was 141, HDL 66.4, and triglycerides 139.  She is not on medications.  6. Anxiety and depression Patient sees Dr. Lolly Mustache.  She is on Zoloft, BuSpar, and Lamictal.  Assessment/Plan:   1. Other fatigue Sheri [name] does feel that her weight is causing her energy to be lower than it should be. Fatigue may be related to obesity, depression or many other causes. Labs will be ordered, and in the meanwhile, Sheri [name] will focus on self care including making healthy food choices, increasing physical activity and focusing on stress reduction.  - EKG 12-Lead - Vitamin B12 - Folate - VITAMIN D 25 Hydroxy (Vit-D Deficiency, Fractures) - TSH - T4, free - T3  2. SOBOE (shortness of breath on exertion) Sheri [name] does feel that she gets out of breath more easily that she used to when she exercises. Sheri [name]'s shortness of breath appears to be obesity related and exercise induced. She has agreed to work on weight loss and gradually increase exercise to treat her exercise induced shortness of breath. Will continue to monitor closely.  - CBC with Differential/Platelet  3. Prediabetes We will check labs today, and we will follow-up at her next appointment.  - Comprehensive metabolic panel - Hemoglobin A1c - Insulin, random  4. History of pulmonary embolism Patient is to follow-up with cardiology at her previously scheduled appointment.  5. Pure hypercholesterolemia We will check labs today, and we will follow-up at her next appointment.  - Lipid Panel With LDL/HDL Ratio  6. Anxiety and depression Patient is to follow-up with Dr. Lolly Mustache for further management.  7. Class 2 severe obesity with serious comorbidity and body mass index (BMI) of 36.0 to 36.9 in adult, unspecified obesity type (HCC) Sheri [name] is currently in the action stage of change and her goal  is to continue with weight loss efforts. I recommend Sheri [name] begin the structured treatment plan as follows:  She has agreed to the Category 3 Plan.  Exercise goals: No exercise has been prescribed at this time.   Behavioral modification strategies: increasing lean protein intake, meal planning and cooking strategies, keeping healthy foods in the home, and planning for success.  She was informed of the importance of frequent follow-up visits to maximize her success with intensive lifestyle modifications for her multiple health conditions. She was informed we would discuss her lab results at her next visit unless there is a critical issue that needs to be addressed sooner. Sheri [name] agreed to keep her next visit at the agreed upon time to discuss these results.  Objective:   Blood pressure 110/75, pulse 81, temperature 98.2 F (36.8 C), height 5\' 4"  (1.626 m), weight 214 lb (97.1 kg), last menstrual period 09/12/2019, SpO2 99 %, currently breastfeeding. Body mass index is 36.73 kg/m.  EKG: Normal sinus rhythm, rate 78 BPM.  Indirect Calorimeter completed today shows a VO2 of 252 and a REE of 1742.  Her calculated basal metabolic rate is 4782 thus her basal metabolic rate is worse than expected.  General: Cooperative, alert, well developed, in no acute distress. HEENT: Conjunctivae and lids unremarkable. Cardiovascular: Regular rhythm.  Lungs: Normal work of breathing. Neurologic: No focal deficits.   Lab Results  Component Value Date   CREATININE 0.71 09/15/2022   BUN 9 09/15/2022   NA 139 09/15/2022   K 4.3 09/15/2022   CL 102 09/15/2022   CO2 22 09/15/2022   Lab Results  Component Value Date   ALT 15 09/15/2022   AST 11 09/15/2022   ALKPHOS 61 09/15/2022   BILITOT 0.2 09/15/2022   Lab Results  Component Value Date   HGBA1C 5.7 (H) 09/15/2022   HGBA1C 6.3 01/06/2022   HGBA1C 5.8 06/12/2020   HGBA1C 5.4 05/23/2019   HGBA1C 5.5 10/20/2018   Lab Results  Component  Value Date   INSULIN 20.1 09/15/2022   Lab Results  Component Value Date   TSH 2.400 09/15/2022   Lab Results  Component Value Date   CHOL 191 09/15/2022   HDL 45 09/15/2022   LDLCALC 123 (H) 09/15/2022   LDLDIRECT 150.0 02/13/2015   TRIG 127 09/15/2022   CHOLHDL 4 06/12/2020   Lab Results  Component Value Date   WBC 7.7 09/15/2022   HGB 12.9 09/15/2022   HCT 39.2 09/15/2022   MCV 87 09/15/2022   PLT 334 09/15/2022   Lab Results  Component Value Date   IRON 54 06/12/2020   FERRITIN 12.1 06/12/2020   Attestation Statements:   Reviewed by clinician on day of visit: allergies, medications, problem list, medical history, surgical history, family history, social history, and previous encounter notes.  Time spent on visit including pre-visit chart review and post-visit charting and care was 45 minutes.   I, Burt Knack, am acting as transcriptionist for Reuben Likes, MD.  This is the patient's first visit at [facility]. The patient's NEW PATIENT PACKET was reviewed at length. Included in the packet: current and past health history, medications, allergies, ROS, gynecologic history (women only), surgical history, family history, social history, weight history, weight loss surgery history (for those that have had weight loss surgery), nutritional evaluation, mood and food questionnaire, PHQ9, Epworth questionnaire, sleep habits questionnaire, patient life and health improvement goals questionnaire. These will all be scanned into the patient's chart under media.   During the visit, I independently reviewed the patient's EKG, bioimpedance scale results, and indirect calorimeter results. I used this information to tailor a meal plan for the patient that will help her to lose weight and will improve her obesity-related conditions going forward. I performed a medically necessary appropriate examination and/or evaluation. I discussed the assessment and treatment plan  with the patient. The patient was provided an opportunity to ask questions and all were answered. The patient agreed with the plan and demonstrated an understanding of the instructions. Labs were ordered at this visit and will be reviewed at the next visit unless more critical results need to be addressed immediately. Clinical information was updated and documented in the EMR.    I have reviewed the above documentation for accuracy and completeness, and I agree with the above. - Reuben Likes, MD

## 2022-09-16 ENCOUNTER — Other Ambulatory Visit (HOSPITAL_COMMUNITY): Payer: Self-pay | Admitting: Psychiatry

## 2022-09-16 DIAGNOSIS — F419 Anxiety disorder, unspecified: Secondary | ICD-10-CM

## 2022-09-16 LAB — CBC WITH DIFFERENTIAL/PLATELET
Basophils Absolute: 0 10*3/uL (ref 0.0–0.2)
Basos: 1 %
EOS (ABSOLUTE): 0.2 10*3/uL (ref 0.0–0.4)
Eos: 2 %
Hematocrit: 39.2 % (ref 34.0–46.6)
Hemoglobin: 12.9 g/dL (ref 11.1–15.9)
Immature Grans (Abs): 0 10*3/uL (ref 0.0–0.1)
Immature Granulocytes: 0 %
Lymphocytes Absolute: 2 10*3/uL (ref 0.7–3.1)
Lymphs: 26 %
MCH: 28.5 pg (ref 26.6–33.0)
MCHC: 32.9 g/dL (ref 31.5–35.7)
MCV: 87 fL (ref 79–97)
Monocytes Absolute: 0.6 10*3/uL (ref 0.1–0.9)
Monocytes: 8 %
Neutrophils Absolute: 4.9 10*3/uL (ref 1.4–7.0)
Neutrophils: 63 %
Platelets: 334 10*3/uL (ref 150–450)
RBC: 4.52 x10E6/uL (ref 3.77–5.28)
RDW: 13.2 % (ref 11.7–15.4)
WBC: 7.7 10*3/uL (ref 3.4–10.8)

## 2022-09-16 LAB — COMPREHENSIVE METABOLIC PANEL
ALT: 15 IU/L (ref 0–32)
AST: 11 IU/L (ref 0–40)
Albumin/Globulin Ratio: 1.6 (ref 1.2–2.2)
Albumin: 4.4 g/dL (ref 3.9–4.9)
Alkaline Phosphatase: 61 IU/L (ref 44–121)
BUN/Creatinine Ratio: 13 (ref 9–23)
BUN: 9 mg/dL (ref 6–20)
Bilirubin Total: 0.2 mg/dL (ref 0.0–1.2)
CO2: 22 mmol/L (ref 20–29)
Calcium: 9.1 mg/dL (ref 8.7–10.2)
Chloride: 102 mmol/L (ref 96–106)
Creatinine, Ser: 0.71 mg/dL (ref 0.57–1.00)
Globulin, Total: 2.8 g/dL (ref 1.5–4.5)
Glucose: 81 mg/dL (ref 70–99)
Potassium: 4.3 mmol/L (ref 3.5–5.2)
Sodium: 139 mmol/L (ref 134–144)
Total Protein: 7.2 g/dL (ref 6.0–8.5)
eGFR: 115 mL/min/{1.73_m2} (ref >59)

## 2022-09-16 LAB — T3: T3, Total: 137 ng/dL (ref 71–180)

## 2022-09-16 LAB — LIPID PANEL WITH LDL/HDL RATIO
Cholesterol, Total: 191 mg/dL (ref 100–199)
HDL: 45 mg/dL (ref >39)
LDL Chol Calc (NIH): 123 mg/dL — ABNORMAL HIGH (ref 0–99)
LDL/HDL Ratio: 2.7 ratio (ref 0.0–3.2)
Triglycerides: 127 mg/dL (ref 0–149)
VLDL Cholesterol Cal: 23 mg/dL (ref 5–40)

## 2022-09-16 LAB — T4, FREE: Free T4: 1.26 ng/dL (ref 0.82–1.77)

## 2022-09-16 LAB — FOLATE: Folate: 13.1 ng/mL (ref >3.0)

## 2022-09-16 LAB — INSULIN, RANDOM: INSULIN: 20.1 u[IU]/mL (ref 2.6–24.9)

## 2022-09-16 LAB — TSH: TSH: 2.4 u[IU]/mL (ref 0.450–4.500)

## 2022-09-16 LAB — VITAMIN D 25 HYDROXY (VIT D DEFICIENCY, FRACTURES): Vit D, 25-Hydroxy: 32.6 ng/mL (ref 30.0–100.0)

## 2022-09-16 LAB — HEMOGLOBIN A1C
Est. average glucose Bld gHb Est-mCnc: 117 mg/dL
Hgb A1c MFr Bld: 5.7 % — ABNORMAL HIGH (ref 4.8–5.6)

## 2022-09-16 LAB — VITAMIN B12: Vitamin B-12: 478 pg/mL (ref 232–1245)

## 2022-09-17 ENCOUNTER — Encounter (INDEPENDENT_AMBULATORY_CARE_PROVIDER_SITE_OTHER): Payer: Self-pay | Admitting: Family Medicine

## 2022-09-17 NOTE — Telephone Encounter (Signed)
Please advise 

## 2022-09-27 DIAGNOSIS — F4320 Adjustment disorder, unspecified: Secondary | ICD-10-CM | POA: Diagnosis not present

## 2022-09-29 ENCOUNTER — Ambulatory Visit: Payer: 59 | Admitting: Sports Medicine

## 2022-09-29 VITALS — BP 120/80 | HR 84 | Ht 64.0 in | Wt 214.0 lb

## 2022-09-29 DIAGNOSIS — Z30431 Encounter for routine checking of intrauterine contraceptive device: Secondary | ICD-10-CM | POA: Diagnosis not present

## 2022-09-29 DIAGNOSIS — M9906 Segmental and somatic dysfunction of lower extremity: Secondary | ICD-10-CM | POA: Diagnosis not present

## 2022-09-29 DIAGNOSIS — Z01419 Encounter for gynecological examination (general) (routine) without abnormal findings: Secondary | ICD-10-CM | POA: Diagnosis not present

## 2022-09-29 DIAGNOSIS — M25572 Pain in left ankle and joints of left foot: Secondary | ICD-10-CM | POA: Diagnosis not present

## 2022-09-29 DIAGNOSIS — N926 Irregular menstruation, unspecified: Secondary | ICD-10-CM | POA: Diagnosis not present

## 2022-09-29 DIAGNOSIS — Z6837 Body mass index (BMI) 37.0-37.9, adult: Secondary | ICD-10-CM | POA: Diagnosis not present

## 2022-09-29 DIAGNOSIS — Z01411 Encounter for gynecological examination (general) (routine) with abnormal findings: Secondary | ICD-10-CM | POA: Diagnosis not present

## 2022-09-29 MED ORDER — MELOXICAM 15 MG PO TABS
15.0000 mg | ORAL_TABLET | Freq: Every day | ORAL | 0 refills | Status: DC
Start: 2022-09-29 — End: 2022-11-09

## 2022-09-29 NOTE — Progress Notes (Signed)
[name] [address] [place] [address] Phone: [phone]   Assessment and Plan:     1. Acute left ankle pain 2. Somatic dysfunction of lower extremities -Acute, uncomplicated, initial sports medicine visit - Left ankle pain after ankle sprain occurring yesterday.  Mechanism occurred quickly and is unclear at this time, though most specifically, soleus appears to have been strained based on physical exam - Negative [place] foot and ankle rules, send imaging at today's visit - Start meloxicam 15 mg daily x2 weeks.  If still having pain after 2 weeks, complete 3rd-week of meloxicam. May use remaining meloxicam as needed once daily for pain control.  Do not to use additional NSAIDs while taking meloxicam.  May use Tylenol [phone] mg 2 to 3 times a day for breakthrough pain. - Start HEP for ankle - May use lace up ankle brace as needed if pain with ambulation - Patient has benefited from OMT in the past and requested OMT for ankle at today's visit.  Tolerated well with muscle energy, FPR, HVLA of left lower extremity  Other orders - meloxicam (MOBIC) 15 MG tablet; Take 1 tablet (15 mg total) by mouth daily.    Pertinent previous records reviewed include none   Follow Up: As needed if no improvement or worsening of symptoms.  Would consider x-ray versus ultrasound versus physical therapy   Subjective:   I, Sheri [name], am serving as a Neurosurgeon for Doctor Richardean Sale  Chief Complaint: left ankle pain   HPI:   09/30/2022 Patient is a 32 year old female complaining of left ankle pain. Patient states that she hurt her ankle last night, she is taking roller skating class and tried a new jump , ibu for the pain , and muscle relaxer. Pain radiates to the achilles, medial and lateral ankle , no radiating pain, no numbness or tingling  Relevant Historical Information: None pertinent  Additional pertinent review of systems  negative.   Current Outpatient Medications:    albuterol (PROAIR HFA) 108 (90 Base) MCG/ACT inhaler, Inhale 2 puffs into the lungs every 6 (six) hours as needed for wheezing or shortness of breath., Disp: 1 each, Rfl: 1   amoxicillin-clavulanate (AUGMENTIN) 875-125 MG tablet, Take 1 tablet by mouth 2 (two) times daily., Disp: 20 tablet, Rfl: 0   aspirin EC 81 MG tablet, Take 81 mg by mouth daily. Swallow whole., Disp: , Rfl:    busPIRone (BUSPAR) 15 MG tablet, Take 1 tablet (15 mg total) by mouth 2 (two) times daily., Disp: 90 tablet, Rfl: 0   cetirizine (ZYRTEC) 10 MG tablet, Take 10 mg by mouth daily., Disp: , Rfl:    lamoTRIgine (LAMICTAL) 25 MG tablet, Take two tablet by mouth twice times daily., Disp: 360 tablet, Rfl: 0   meloxicam (MOBIC) 15 MG tablet, Take 1 tablet (15 mg total) by mouth daily., Disp: 30 tablet, Rfl: 0   sertraline (ZOLOFT) 50 MG tablet, Take 1 tablet (50 mg total) by mouth daily., Disp: 90 tablet, Rfl: 0   SUMAtriptan (IMITREX) 25 MG tablet, TAKE 1 TABLET BY MOUTH AT START OF MIGRAINE., Disp: 10 tablet, Rfl: 1   tiZANidine (ZANAFLEX) 4 MG tablet, TAKE 1 TABLET BY MOUTH AT BEDTIME., Disp: 90 tablet, Rfl: 1   Objective:     Vitals:   09/29/22 0855  BP: 120/80  Pulse: 84  SpO2: 98%  Weight: 214 lb (97.1 kg)  Height: 5\' 4"  (1.626 m)  Body mass index is 36.73 kg/m.    Physical Exam:    Gen: Appears well, nad, nontoxic and pleasant Psych: Alert and oriented, appropriate mood and affect Neuro: sensation intact, strength is 5/5 with df/pf/inv/ev, muscle tone wnl Skin: no susupicious lesions or rashes  Left ankle:  No deformity, no swelling or effusion NTTP over fibular head, lat mal, medial mal, achilles, navicular, base of 5th, ATFL, CFL, deltoid, calcaneous or midfoot ROM DF 30, PF 45, inv/ev intact Negative ant drawer, talar tilt, rotation test, squeeze test. Neg thompson No pain with resisted eversion  Mild posterior ankle tenderness with resisted  inversion  Electronically signed by:  [name] 9:16 AM 09/29/22

## 2022-09-29 NOTE — Patient Instructions (Signed)
-   Start meloxicam 15 mg daily x2 weeks.  If still having pain after 2 weeks, complete 3rd-week of meloxicam. May use remaining meloxicam as needed once daily for pain control.  Do not to use additional NSAIDs while taking meloxicam.  May use Tylenol [phone] mg 2 to 3 times a day for breakthrough pain. Ankle HEP  As needed follow up

## 2022-09-30 ENCOUNTER — Encounter (INDEPENDENT_AMBULATORY_CARE_PROVIDER_SITE_OTHER): Payer: Self-pay | Admitting: Family Medicine

## 2022-09-30 ENCOUNTER — Ambulatory Visit (INDEPENDENT_AMBULATORY_CARE_PROVIDER_SITE_OTHER): Payer: 59 | Admitting: Family Medicine

## 2022-09-30 VITALS — BP 102/63 | HR 77 | Temp 98.3°F | Ht 64.0 in | Wt 211.0 lb

## 2022-09-30 DIAGNOSIS — R7303 Prediabetes: Secondary | ICD-10-CM

## 2022-09-30 DIAGNOSIS — E7849 Other hyperlipidemia: Secondary | ICD-10-CM

## 2022-09-30 DIAGNOSIS — E669 Obesity, unspecified: Secondary | ICD-10-CM | POA: Diagnosis not present

## 2022-09-30 DIAGNOSIS — E559 Vitamin D deficiency, unspecified: Secondary | ICD-10-CM

## 2022-09-30 DIAGNOSIS — Z6836 Body mass index (BMI) 36.0-36.9, adult: Secondary | ICD-10-CM | POA: Diagnosis not present

## 2022-09-30 MED ORDER — VITAMIN D (ERGOCALCIFEROL) 1.25 MG (50000 UNIT) PO CAPS
50000.0000 [IU] | ORAL_CAPSULE | ORAL | 0 refills | Status: DC
Start: 2022-09-30 — End: 2022-10-13

## 2022-09-30 MED ORDER — METFORMIN HCL 500 MG PO TABS
500.0000 mg | ORAL_TABLET | Freq: Every day | ORAL | 0 refills | Status: DC
Start: 2022-09-30 — End: 2022-10-13

## 2022-09-30 NOTE — Progress Notes (Unsigned)
Chief Complaint:   OBESITY Sheri [name] is here to discuss her progress with her obesity treatment plan along with follow-up of her obesity related diagnoses. Sheri [name] is on the Category 3 Plan and states she is following her eating plan approximately 90-95% of the time. Sheri [name] states she is roller skating for 60 minutes 1 time per week.  Today's visit was #: 2 Starting weight: 214 lbs Starting date: 09/15/2022 Today's weight: 211 lbs Today's date: 09/30/2022 Total lbs lost to date: 3 Total lbs lost since last in-office visit: 3  Interim History: Felt meal plan was easier than she thought and her carb cravings have  significant decreased.  The 8oz at supper is difficult to get all of it in after [facility].  She is replacing her carb at supper with another protein. No upcoming plans for next few weeks.  For snack calories she was doing carrots and hummus, lite cheese stick, cup of milk. Was relatively happy with the carrots and hummus.  Wondering about types of meat- doing chicken, [place], salmon.   Subjective:   1. Prediabetes Patient's recent A1c was 5.7 (previously 6.3).  She is on Lamictal, BuSpar, and Zoloft.  2. Other hyperlipidemia Patient is not on medications.  Her recent HDL was 45, triglycerides 161, and LDL 123.  All excludes her from ASCVD risk.  3. Vitamin D deficiency Patient is not on vitamin D, and she notes fatigue.  Her recent vitamin D level was 32.6.  Assessment/Plan:   1. Prediabetes Patient agreed to start metformin 500 mg once daily with no refills.  - metFORMIN (GLUCOPHAGE) 500 MG tablet; Take 1 tablet (500 mg total) by mouth daily with breakfast.  Dispense: 30 tablet; Refill: 0  2. Other hyperlipidemia We will repeat labs in 3 months.  3. Vitamin D deficiency Patient agreed to start prescription vitamin D 50,000 IU once weekly with no refills.  - Vitamin D, Ergocalciferol, (DRISDOL) 1.25 MG (50000 UNIT) CAPS capsule; Take 1 capsule (50,000 Units total)  by mouth every 7 (seven) days.  Dispense: 4 capsule; Refill: 0  4. BMI 36.0-36.9,adult  5. Obesity with starting BMI of 36.8 Sheri [name] is currently in the action stage of change. As such, her goal is to continue with weight loss efforts. She has agreed to the Category 3 Plan.   Exercise goals: All adults should avoid inactivity. Some physical activity is better than none, and adults who participate in any amount of physical activity gain some health benefits.  Behavioral modification strategies: increasing lean protein intake, meal planning and cooking strategies, keeping healthy foods in the home, and planning for success.  Sheri [name] has agreed to follow-up with our clinic in 2 to 3 weeks. She was informed of the importance of frequent follow-up visits to maximize her success with intensive lifestyle modifications for her multiple health conditions.   Objective:   Blood pressure 102/63, pulse 77, temperature 98.3 F (36.8 C), height 5\' 4"  (1.626 m), weight 211 lb (95.7 kg), last menstrual period 09/12/2019, SpO2 98 %, currently breastfeeding. Body mass index is 36.22 kg/m.  General: Cooperative, alert, well developed, in no acute distress. HEENT: Conjunctivae and lids unremarkable. Cardiovascular: Regular rhythm.  Lungs: Normal work of breathing. Neurologic: No focal deficits.   Lab Results  Component Value Date   CREATININE 0.71 09/15/2022   BUN 9 09/15/2022   NA 139 09/15/2022   K 4.3 09/15/2022   CL 102 09/15/2022   CO2 22 09/15/2022   Lab Results  Component Value Date  ALT 15 09/15/2022   AST 11 09/15/2022   ALKPHOS 61 09/15/2022   BILITOT 0.2 09/15/2022   Lab Results  Component Value Date   HGBA1C 5.7 (H) 09/15/2022   HGBA1C 6.3 01/06/2022   HGBA1C 5.8 06/12/2020   HGBA1C 5.4 05/23/2019   HGBA1C 5.5 10/20/2018   Lab Results  Component Value Date   INSULIN 20.1 09/15/2022   Lab Results  Component Value Date   TSH 2.400 09/15/2022   Lab Results   Component Value Date   CHOL 191 09/15/2022   HDL 45 09/15/2022   LDLCALC 123 (H) 09/15/2022   LDLDIRECT 150.0 02/13/2015   TRIG 127 09/15/2022   CHOLHDL 4 06/12/2020   Lab Results  Component Value Date   VD25OH 32.6 09/15/2022   Lab Results  Component Value Date   WBC 7.7 09/15/2022   HGB 12.9 09/15/2022   HCT 39.2 09/15/2022   MCV 87 09/15/2022   PLT 334 09/15/2022   Lab Results  Component Value Date   IRON 54 06/12/2020   FERRITIN 12.1 06/12/2020   Attestation Statements:   Reviewed by clinician on day of visit: allergies, medications, problem list, medical history, surgical history, family history, social history, and previous encounter notes.  Time spent on visit including pre-visit chart review and post-visit care and charting was 44 minutes.   I, Burt Knack, am acting as transcriptionist for Reuben Likes, MD. I have reviewed the above documentation for accuracy and completeness, and I agree with the above. - Reuben Likes, MD

## 2022-10-05 DIAGNOSIS — F4320 Adjustment disorder, unspecified: Secondary | ICD-10-CM | POA: Diagnosis not present

## 2022-10-12 DIAGNOSIS — F330 Major depressive disorder, recurrent, mild: Secondary | ICD-10-CM | POA: Diagnosis not present

## 2022-10-13 ENCOUNTER — Ambulatory Visit (INDEPENDENT_AMBULATORY_CARE_PROVIDER_SITE_OTHER): Payer: 59 | Admitting: Family Medicine

## 2022-10-13 ENCOUNTER — Encounter (INDEPENDENT_AMBULATORY_CARE_PROVIDER_SITE_OTHER): Payer: Self-pay | Admitting: Family Medicine

## 2022-10-13 VITALS — BP 122/68 | HR 81 | Temp 97.9°F | Ht 64.0 in | Wt 209.0 lb

## 2022-10-13 DIAGNOSIS — E559 Vitamin D deficiency, unspecified: Secondary | ICD-10-CM

## 2022-10-13 DIAGNOSIS — Z6835 Body mass index (BMI) 35.0-35.9, adult: Secondary | ICD-10-CM

## 2022-10-13 DIAGNOSIS — E669 Obesity, unspecified: Secondary | ICD-10-CM | POA: Diagnosis not present

## 2022-10-13 DIAGNOSIS — R7303 Prediabetes: Secondary | ICD-10-CM | POA: Diagnosis not present

## 2022-10-13 DIAGNOSIS — Z6836 Body mass index (BMI) 36.0-36.9, adult: Secondary | ICD-10-CM

## 2022-10-13 MED ORDER — VITAMIN D (ERGOCALCIFEROL) 1.25 MG (50000 UNIT) PO CAPS: 50000.0000 [IU] | ORAL_CAPSULE | ORAL | 0 refills | Status: DC

## 2022-10-13 MED ORDER — METFORMIN HCL 500 MG PO TABS
500.0000 mg | ORAL_TABLET | Freq: Every day | ORAL | 0 refills | Status: DC
Start: 2022-10-13 — End: 2022-10-28

## 2022-10-13 NOTE — Progress Notes (Unsigned)
Chief Complaint:   OBESITY Sheri [name] is here to discuss her progress with her obesity treatment plan along with follow-up of her obesity related diagnoses. Sheri [name] is on the Category 3 Plan and states she is following her eating plan approximately 70% of the time. Sheri [name] states she is swimming for 30 minutes 3-4 times per week.  Today's visit was #: 3 Starting weight: 214 lbs Starting date: 09/15/2022 Today's weight: 209 lbs Today's date: 10/13/2022 Total lbs lost to date: 5 Total lbs lost since last in-office visit: 2  Interim History: Since last appointment she had a big work event that she is relieved about.  She is PMSing currently and noticing a significant increase in frequency of bowel movements on metformin.  Patient has all next week off and she is excited to just stay home.  Her daughter is off next week as well.  She ate out more frequently and tried to be more controlled with her choices and quantity.  Recognizes this wasn't as on plan as she would have liked.  No saboteurs in the next few weeks expected.  She is definitely craving carbs currently.   Subjective:   1. Prediabetes Patient is on Glucophage daily.  Increased and frequency of BM but no cramping (currently bloated due to PMS).  2. Vitamin D deficiency Patient is on prescription vitamin D.  She denies nausea, vomiting, or muscle weakness but notes fatigue.  Her last vitamin D level was 32.6.  Assessment/Plan:   1. Prediabetes Patient will continue Glucophage 500 mg, and we will refill for 1 month.  - metFORMIN (GLUCOPHAGE) 500 MG tablet; Take 1 tablet (500 mg total) by mouth daily with breakfast.  Dispense: 30 tablet; Refill: 0  2. Vitamin D deficiency Patient will continue prescription vitamin D once weekly, and we will refill for 1 month.  - Vitamin D, Ergocalciferol, (DRISDOL) 1.25 MG (50000 UNIT) CAPS capsule; Take 1 capsule (50,000 Units total) by mouth every 7 (seven) days.  Dispense: 4 capsule;  Refill: 0  3. BMI 36.0-36.9,adult  4. Obesity with starting BMI of 36.8 Sheri [name] is currently in the action stage of change. As such, her goal is to continue with weight loss efforts. She has agreed to the Category 3 Plan.   Exercise goals: All adults should avoid inactivity. Some physical activity is better than none, and adults who participate in any amount of physical activity gain some health benefits.  Behavioral modification strategies: increasing lean protein intake, meal planning and cooking strategies, keeping healthy foods in the home, and planning for success.  Sheri [name] has agreed to follow-up with our clinic in 2 weeks. She was informed of the importance of frequent follow-up visits to maximize her success with intensive lifestyle modifications for her multiple health conditions.   Objective:   Blood pressure 122/68, pulse 81, temperature 97.9 F (36.6 C), height 5\' 4"  (1.626 m), weight 209 lb (94.8 kg), last menstrual period 09/12/2019, SpO2 99 %, currently breastfeeding. Body mass index is 35.87 kg/m.  General: Cooperative, alert, well developed, in no acute distress. HEENT: Conjunctivae and lids unremarkable. Cardiovascular: Regular rhythm.  Lungs: Normal work of breathing. Neurologic: No focal deficits.   Lab Results  Component Value Date   CREATININE 0.71 09/15/2022   BUN 9 09/15/2022   NA 139 09/15/2022   K 4.3 09/15/2022   CL 102 09/15/2022   CO2 22 09/15/2022   Lab Results  Component Value Date   ALT 15 09/15/2022   AST 11 09/15/2022  ALKPHOS 61 09/15/2022   BILITOT 0.2 09/15/2022   Lab Results  Component Value Date   HGBA1C 5.7 (H) 09/15/2022   HGBA1C 6.3 01/06/2022   HGBA1C 5.8 06/12/2020   HGBA1C 5.4 05/23/2019   HGBA1C 5.5 10/20/2018   Lab Results  Component Value Date   INSULIN 20.1 09/15/2022   Lab Results  Component Value Date   TSH 2.400 09/15/2022   Lab Results  Component Value Date   CHOL 191 09/15/2022   HDL 45 09/15/2022    LDLCALC 123 (H) 09/15/2022   LDLDIRECT 150.0 02/13/2015   TRIG 127 09/15/2022   CHOLHDL 4 06/12/2020   Lab Results  Component Value Date   VD25OH 32.6 09/15/2022   Lab Results  Component Value Date   WBC 7.7 09/15/2022   HGB 12.9 09/15/2022   HCT 39.2 09/15/2022   MCV 87 09/15/2022   PLT 334 09/15/2022   Lab Results  Component Value Date   IRON 54 06/12/2020   FERRITIN 12.1 06/12/2020   Attestation Statements:   Reviewed by clinician on day of visit: allergies, medications, problem list, medical history, surgical history, family history, social history, and previous encounter notes.   I, Burt Knack, am acting as transcriptionist for Reuben Likes, MD.  I have reviewed the above documentation for accuracy and completeness, and I agree with the above. - Reuben Likes, MD

## 2022-10-14 NOTE — Progress Notes (Unsigned)
Cardiology Office Note:    Date:  10/19/2022   ID:  Sheri [name], DOB 06-Jul-1990, MRN [id number]  PCP:  Madelin Headings, MD   [facility] Providers Cardiologist:  Tobias Alexander, MD {   Referring MD: Madelin Headings, MD    History of Present Illness:    Sheri [name] is a 32 y.o. female with a hx of PE in 2011 in the setting of OCP use, syncope in 2018 in the setting of norovirus felt secondary to dehydration and hypotension, mild HLD, depression, and bipolar disorder who was previously following with Dr. Delton See who now presents to clinic for follow-up.  Per review of the record, patient with history of syncope in the setting of norovirus secondary to dehydration. CT angiogram 05/16/2016 negative for PE. TTE with normal LVEF 60 to 65% with mild MR/TR.  Saw Dr. Anne Fu in 01/2021 for palpitations with apple watch revealing HR in 120s. Zio monitor with NSR with average HR 90, rare ectopy, no arrhythmias.   Last seen in clinic on 06/2021 where she was doing well from a CV standpoint. Palpitations improved since quitting her job.  Today, the patient overall feels well. No chest pain, SOB, orthopnea, or LE edema. BP is running on the lower side but she states lost 5-6lbs with the healthy weight and wellness clinic. No lightheadedness, dizziness or syncope. Not on BP meds. Remains active without exertional symptoms.  Past Medical History:  Diagnosis Date   Alcohol addiction (HCC)    Anemia    Anxiety    Back pain    Bipolar depression (HCC)    under psych rx    Carpal tunnel syndrome    Chest pain    Depression    Drug addiction in remission [facility])    heroin   Enteritis due to Norovirus 05/18/2016   Heartburn    Hx of pulmonary embolus 02/13/2015   Hyperlipidemia    Hypokalemia 05/17/2016   IUD (intrauterine device) in place 02/13/2015   Joint pain    Mild mitral regurgitation    Mild tricuspid regurgitation    Mitral regurgitation 06/13/2018   Near syncope  05/16/2016   a. felt due to norovirus.   Nonallopathic lesion of cervical region 09/01/2018   Nonallopathic lesion of rib cage 09/01/2018   Obesity    Orthostatic hypotension 05/18/2016   Palpitations    PCOS (polycystic ovarian syndrome) 02/13/2015   Prediabetes    Pulmonary embolism (HCC) 2011   neg heme evaluation  felt to be from ocps    Scapular dyskinesis 09/01/2018   Smoker 12/30/2016   Syncope and collapse 05/16/2016   Thoracic outlet syndrome of left thoracic outlet 02/15/2018   Thoracic radiculopathy due to degenerative joint disease of spine 10/13/2016   CT and XR show at least 3 levels of DDD of thoracic spine  I suspect this is related to both the scoliosis and the history of dance    Past Surgical History:  Procedure Laterality Date   NO PAST SURGERIES      Current Medications: Current Meds  Medication Sig   albuterol (PROAIR HFA) 108 (90 Base) MCG/ACT inhaler Inhale 2 puffs into the lungs every 6 (six) hours as needed for wheezing or shortness of breath.   aspirin EC 81 MG tablet Take 81 mg by mouth daily. Swallow whole.   busPIRone (BUSPAR) 15 MG tablet Take 1 tablet (15 mg total) by mouth 2 (two) times daily.   cetirizine (ZYRTEC) 10 MG tablet Take 10  mg by mouth daily.   lamoTRIgine (LAMICTAL) 25 MG tablet Take two tablet by mouth twice times daily.   meloxicam (MOBIC) 15 MG tablet Take 1 tablet (15 mg total) by mouth daily.   metFORMIN (GLUCOPHAGE) 500 MG tablet Take 1 tablet (500 mg total) by mouth daily with breakfast.   sertraline (ZOLOFT) 50 MG tablet Take 1 tablet (50 mg total) by mouth daily.   SUMAtriptan (IMITREX) 25 MG tablet TAKE 1 TABLET BY MOUTH AT START OF MIGRAINE.   tiZANidine (ZANAFLEX) 4 MG tablet TAKE 1 TABLET BY MOUTH AT BEDTIME.   Vitamin D, Ergocalciferol, (DRISDOL) 1.25 MG (50000 UNIT) CAPS capsule Take 1 capsule (50,000 Units total) by mouth every 7 (seven) days.     Allergies:   Bupropion   Social History   Socioeconomic History    Marital status: Married    Spouse name: Not on file   Number of children: Not on file   Years of education: Not on file   Highest education level: Bachelor's degree (e.g., BA, AB, BS)  Occupational History   Occupation: nanny   Occupation: Producer, television/film/video in Marketing & Outreach  Tobacco Use   Smoking status: Former    Types: Cigarettes    Quit date: 12/20/2015    Years since quitting: 6.8   Smokeless tobacco: Never  Vaping Use   Vaping Use: Never used  Substance and Sexual Activity   Alcohol use: No    Alcohol/week: 0.0 standard drinks of alcohol   Drug use: No   Sexual activity: Not on file    Comment: previous IUD  Other Topics Concern   Not on file  Social History Narrative   5-10 hours of sleep per night   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)   Recovering from drug and alcohol addiction   Joined [employer]   Lives with her parents   2 dogs in the home      [place] 3 years  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       Right Handed    Lives in a one story home    Social Determinants of Health   Financial Resource Strain: Medium Risk (01/05/2022)   Overall Financial Resource Strain (CARDIA)    Difficulty of Paying Living Expenses: Somewhat hard  Food Insecurity: Food Insecurity Present (01/05/2022)   Hunger Vital Sign    Worried About Running Out of Food in the Last Year: Sometimes true    Ran Out of Food in the Last Year: Never true  Transportation Needs: No Transportation Needs (01/05/2022)   PRAPARE - Administrator, Civil Service (Medical): No    Lack of Transportation (Non-Medical): No  Physical Activity: Insufficiently Active (01/05/2022)   Exercise Vital Sign    Days of Exercise per Week: 2 days    Minutes of Exercise per Session: 30 min  Stress: Stress Concern Present (01/05/2022)   [employer] of Occupational Health - Occupational Stress Questionnaire    Feeling of Stress : To some extent  Social Connections: Moderately Integrated  (01/05/2022)   Social Connection and Isolation Panel [NHANES]    Frequency of Communication with Friends and Family: More than three times a week    Frequency of Social Gatherings with Friends and Family: Three times a week    Attends Religious Services: Never    Active Member of Clubs or Organizations: Yes    Attends Banker Meetings: More than 4 times per year    Marital  Status: Married     Family History: The patient's family history includes Anxiety disorder in her father and mother; Bipolar disorder in her mother; Colon cancer in her maternal grandfather; Depression in her father and mother; Hyperlipidemia in her mother; Hypertension in her father; Irritable bowel syndrome in her father; Mental illness in her mother; Obesity in her father; Sleep apnea in her father.  ROS:   Please see the history of present illness.       EKGs/Labs/Other Studies Reviewed:    The following studies were reviewed today: Zio 06-Mar-2021: Sinus rhythm with avg HR 90. Min 57 bpm Rare PAC's PVC's During activities - sinus rhythm, sinus tachycardia (mostly 100-120) decreasing appropriately No adverse rhythms noted.  ECHO 09/19/19:    1. Left ventricular ejection fraction, by estimation, is 65 to 70%. The  left ventricle has normal function. The left ventricle has no regional  wall motion abnormalities. Left ventricular diastolic parameters were  normal.   2. Right ventricular systolic function is normal. The right ventricular  size is normal. There is normal pulmonary artery systolic pressure.   3. The mitral valve is normal in structure. Trivial mitral valve  regurgitation. No evidence of mitral stenosis.   4. The aortic valve is normal in structure. Aortic valve regurgitation is  not visualized. No aortic stenosis is present.   5. The inferior vena cava is normal in size with greater than 50%  respiratory variability, suggesting right atrial pressure of 3 mmHg.   EKG:  No new  ECG  Recent Labs: 09/15/2022: ALT 15; BUN 9; Creatinine, Ser 0.71; Hemoglobin 12.9; Platelets 334; Potassium 4.3; Sodium 139; TSH 2.400  Recent Lipid Panel    Component Value Date/Time   CHOL 191 09/15/2022 0939   TRIG 127 09/15/2022 0939   HDL 45 09/15/2022 0939   CHOLHDL 4 06/12/2020 1045   VLDL 27.8 06/12/2020 1045   LDLCALC 123 (H) 09/15/2022 0939   LDLDIRECT 150.0 02/13/2015 1449          Physical Exam:    VS:  BP 104/72   Pulse 82   Ht 5\' 4"  (1.626 m)   Wt 213 lb 3.2 oz (96.7 kg)   LMP 09/08/2022 (Approximate)   SpO2 99%   BMI 36.60 kg/m     Wt Readings from Last 3 Encounters:  10/19/22 213 lb 3.2 oz (96.7 kg)  10/16/22 212 lb (96.2 kg)  10/13/22 209 lb (94.8 kg)     GEN:  Well nourished, well developed in no acute distress HEENT: Normal NECK: No JVD; No carotid bruits CARDIAC: RRR, no murmurs, rubs, gallops RESPIRATORY:  CTAB, no wheezes ABDOMEN: Soft, non-tender, non-distended MUSCULOSKELETAL:  No edema; No deformity  SKIN: Warm and dry NEUROLOGIC:  Alert and oriented x 3 PSYCHIATRIC:  Normal affect   ASSESSMENT:    1. Palpitations   2. History of pulmonary embolus (PE)   3. Mixed hyperlipidemia   4. Obesity (BMI 35.0-39.9 without comorbidity)    PLAN:    In order of problems listed above:  #Palpitations: Zio monitor reassuring with no arrhythmias and rare ectopy. TTE 2021 with LVEF 65-70%, normal RV, no significant valve disease. Symptoms have significantly improved after quitting her job. Will continue to monitor. -Symptoms improved with reduced stress -Continue hydration  -Minimize caffeine  #History of Provoked PE: Occurred in 2011 in the setting of OCP use. Was on lovenox with prior pregnancy. If plans to become pregnant again, agree with starting lovenox again.  #HLD: LDL 123. Working on  weight loss efforts. -Continue lifestyle modifications  #Obesity: BMI 36. -Continue lifestyle modifications as detailed below -Follows with  healthy weight and wellness clinic  Exercise recommendations: Goal of exercising for at least 30 minutes a day, at least 5 times per week.  Please exercise to a moderate exertion.  This means that while exercising it is difficult to speak in full sentences, however you are not so short of breath that you feel you must stop, and not so comfortable that you can carry on a full conversation.  Exertion level should be approximately a 5/10, if 10 is the most exertion you can perform.  Diet recommendations: Recommend a heart healthy diet such as the Mediterranean diet.  This diet consists of plant based foods, healthy fats, lean meats, olive oil.  It suggests limiting the intake of simple carbohydrates such as white breads, pastries, and pastas.  It also limits the amount of red meat, wine, and dairy products such as cheese that one should consume on a daily basis.            Medication Adjustments/Labs and Tests Ordered: Current medicines are reviewed at length with the patient today.  Concerns regarding medicines are outlined above.  No orders of the defined types were placed in this encounter.  No orders of the defined types were placed in this encounter.   Patient Instructions  Medication Instructions:  Your physician recommends that you continue on your current medications as directed. Please refer to the Current Medication list given to you today.  *If you need a refill on your cardiac medications before your next appointment, please call your pharmacy*  Follow-Up: At [facility], you and your health needs are our priority.  As part of our continuing mission to provide you with exceptional heart care, we have created designated Provider Care Teams.  These Care Teams include your primary Cardiologist (physician) and Advanced Practice Providers (APPs -  Physician Assistants and Nurse Practitioners) who all work together to provide you with the care you need, when you need  it.  Your next appointment:   1 year(s)  Provider:   Jodelle Red, MD        Signed, Meriam Sprague, MD  10/19/2022 9:45 AM    [facility]

## 2022-10-15 ENCOUNTER — Other Ambulatory Visit (HOSPITAL_COMMUNITY): Payer: Self-pay | Admitting: Psychiatry

## 2022-10-15 ENCOUNTER — Other Ambulatory Visit (HOSPITAL_COMMUNITY): Payer: Self-pay

## 2022-10-15 DIAGNOSIS — F419 Anxiety disorder, unspecified: Secondary | ICD-10-CM

## 2022-10-15 MED ORDER — BUSPIRONE HCL 15 MG PO TABS
15.0000 mg | ORAL_TABLET | Freq: Two times a day (BID) | ORAL | 0 refills | Status: DC
Start: 2022-10-15 — End: 2022-11-13

## 2022-10-15 NOTE — Progress Notes (Signed)
Sheri [name] Subjective:   Sheri [name], am serving as a scribe for Dr. Antoine Primas.  I'm seeing this patient by the request  of:  Panosh, Neta Mends, MD  CC: Back pain follow-up  [id number]  Sheri [name] is a 32 y.o. female coming in with complaint of back and neck pain. OMT 08/24/2022. Patient states doing well. Same per usual. Take a look at ankle she hurt. Saw Dr. Jean Rosenthal 6/11 it is feeling better. No other concerns.  Just a regular tightness that she normally has.  Patient feels like a change in her job which is going to happen in August will be beneficial as well  Medications patient has been prescribed: Zanaflex  Taking:         Reviewed prior external information including notes and imaging from previsou exam, outside providers and external EMR if available.   As well as notes that were available from care everywhere and other healthcare systems.  Past medical history, social, surgical and family history all reviewed in electronic medical record.  No pertanent information unless stated regarding to the chief complaint.   Past Medical History:  Diagnosis Date   Alcohol addiction (HCC)    Anemia    Anxiety    Back pain    Bipolar depression (HCC)    under psych rx    Carpal tunnel syndrome    Chest pain    Depression    Drug addiction in remission [facility])    heroin   Enteritis due to Norovirus 05/18/2016   Heartburn    Hx of pulmonary embolus 02/13/2015   Hyperlipidemia    Hypokalemia 05/17/2016   IUD (intrauterine device) in place 02/13/2015   Joint pain    Mild mitral regurgitation    Mild tricuspid regurgitation    Mitral regurgitation 06/13/2018   Near syncope 05/16/2016   a. felt due to norovirus.   Nonallopathic lesion of cervical region 09/01/2018   Nonallopathic lesion of rib cage 09/01/2018   Obesity    Orthostatic hypotension 05/18/2016    Palpitations    PCOS (polycystic ovarian syndrome) 02/13/2015   Prediabetes    Pulmonary embolism (HCC) 2011   neg heme evaluation  felt to be from ocps    Scapular dyskinesis 09/01/2018   Smoker 12/30/2016   Syncope and collapse 05/16/2016   Thoracic outlet syndrome of left thoracic outlet 02/15/2018   Thoracic radiculopathy due to degenerative joint disease of spine 10/13/2016   CT and XR show at least 3 levels of DDD of thoracic spine  I suspect this is related to both the scoliosis and the history of dance    Allergies  Allergen Reactions   Bupropion Other (See Comments)     Review of Systems:  No headache, visual changes, nausea, vomiting, diarrhea, constipation, dizziness, abdominal pain, skin rash, fevers, chills, night sweats, weight loss, swollen lymph nodes, body aches, joint swelling, chest pain, shortness of breath, mood changes. POSITIVE muscle aches  Objective  Blood pressure 102/74, pulse 78, height 5\' 4"  (1.626 m), weight 212 lb (96.2 kg), SpO2 97 %, currently breastfeeding.   General: No apparent distress alert and oriented x3 mood and affect normal, dressed appropriately.  HEENT: Pupils equal, extraocular movements intact  Respiratory: Patient's speak in full sentences and does not appear short of breath  Cardiovascular: No lower extremity edema, non tender, no erythema  Back exam does have some loss of lordosis noted.  Some tenderness to palpation in the paraspinal musculature.  Patient does have some limited sidebending of the neck bilaterally.  Tightness with FABER test as well noted.  Osteopathic findings  C3 flexed rotated and side bent right C5 flexed rotated and side bent left T3 extended rotated and side bent right inhaled rib T7 extended rotated and side bent left L1 flexed rotated and side bent right Sacrum right on right       Assessment and Plan:  Scapular dyskinesis Patient back pain is multifactorial.  Seems to be more secondary to the  scapular dyskinesis.  Do believe that the patient is making good response of the treatment at the moment.  Discussed about lifting child and ergonomics at work when possible.  Follow-up again in 6 to 8 weeks.    Nonallopathic problems  Decision today to treat with OMT was based on Physical Exam  After verbal consent patient was treated with HVLA, ME, FPR techniques in cervical, rib, thoracic, lumbar, and sacral  areas  Patient tolerated the procedure well with improvement in symptoms  Patient given exercises, stretches and lifestyle modifications  See medications in patient instructions if given  Patient will follow up in 4-8 weeks    The above documentation has been reviewed and is accurate and complete Judi Saa, DO          Note: This dictation was prepared with Dragon dictation along with smaller phrase technology. Any transcriptional errors that result from this process are unintentional.

## 2022-10-16 ENCOUNTER — Ambulatory Visit: Payer: 59 | Admitting: Family Medicine

## 2022-10-16 ENCOUNTER — Encounter: Payer: Self-pay | Admitting: Family Medicine

## 2022-10-16 VITALS — BP 102/74 | HR 78 | Ht 64.0 in | Wt 212.0 lb

## 2022-10-16 DIAGNOSIS — G2589 Other specified extrapyramidal and movement disorders: Secondary | ICD-10-CM | POA: Diagnosis not present

## 2022-10-16 DIAGNOSIS — M9903 Segmental and somatic dysfunction of lumbar region: Secondary | ICD-10-CM | POA: Diagnosis not present

## 2022-10-16 DIAGNOSIS — M9908 Segmental and somatic dysfunction of rib cage: Secondary | ICD-10-CM | POA: Diagnosis not present

## 2022-10-16 DIAGNOSIS — M9904 Segmental and somatic dysfunction of sacral region: Secondary | ICD-10-CM | POA: Diagnosis not present

## 2022-10-16 DIAGNOSIS — M9901 Segmental and somatic dysfunction of cervical region: Secondary | ICD-10-CM

## 2022-10-16 DIAGNOSIS — M9902 Segmental and somatic dysfunction of thoracic region: Secondary | ICD-10-CM | POA: Diagnosis not present

## 2022-10-16 NOTE — Assessment & Plan Note (Signed)
Patient back pain is multifactorial.  Seems to be more secondary to the scapular dyskinesis.  Do believe that the patient is making good response of the treatment at the moment.  Discussed about lifting child and ergonomics at work when possible.  Follow-up again in 6 to 8 weeks.

## 2022-10-16 NOTE — Patient Instructions (Signed)
Good to see you! Congrats on new job, it's perfect for you See you again in 6-8 weeks

## 2022-10-19 ENCOUNTER — Ambulatory Visit: Payer: 59 | Attending: Cardiology | Admitting: Cardiology

## 2022-10-19 ENCOUNTER — Ambulatory Visit (INDEPENDENT_AMBULATORY_CARE_PROVIDER_SITE_OTHER): Payer: 59 | Admitting: Internal Medicine

## 2022-10-19 ENCOUNTER — Encounter: Payer: Self-pay | Admitting: Cardiology

## 2022-10-19 VITALS — BP 104/72 | HR 82 | Ht 64.0 in | Wt 213.2 lb

## 2022-10-19 DIAGNOSIS — E669 Obesity, unspecified: Secondary | ICD-10-CM

## 2022-10-19 DIAGNOSIS — Z86711 Personal history of pulmonary embolism: Secondary | ICD-10-CM

## 2022-10-19 DIAGNOSIS — E782 Mixed hyperlipidemia: Secondary | ICD-10-CM | POA: Diagnosis not present

## 2022-10-19 DIAGNOSIS — R002 Palpitations: Secondary | ICD-10-CM | POA: Diagnosis not present

## 2022-10-19 NOTE — Patient Instructions (Signed)
Medication Instructions:  Your physician recommends that you continue on your current medications as directed. Please refer to the Current Medication list given to you today.  *If you need a refill on your cardiac medications before your next appointment, please call your pharmacy*  Follow-Up: At [facility], you and your health needs are our priority.  As part of our continuing mission to provide you with exceptional heart care, we have created designated Provider Care Teams.  These Care Teams include your primary Cardiologist (physician) and Advanced Practice Providers (APPs -  Physician Assistants and Nurse Practitioners) who all work together to provide you with the care you need, when you need it.  Your next appointment:   1 year(s)  Provider:   Jodelle Red, MD

## 2022-10-24 DIAGNOSIS — F330 Major depressive disorder, recurrent, mild: Secondary | ICD-10-CM | POA: Diagnosis not present

## 2022-10-26 DIAGNOSIS — F4320 Adjustment disorder, unspecified: Secondary | ICD-10-CM | POA: Diagnosis not present

## 2022-10-28 ENCOUNTER — Ambulatory Visit (INDEPENDENT_AMBULATORY_CARE_PROVIDER_SITE_OTHER): Payer: 59 | Admitting: Internal Medicine

## 2022-10-28 ENCOUNTER — Encounter (INDEPENDENT_AMBULATORY_CARE_PROVIDER_SITE_OTHER): Payer: Self-pay | Admitting: Internal Medicine

## 2022-10-28 VITALS — BP 109/74 | HR 79 | Temp 98.1°F | Ht 64.0 in | Wt 206.0 lb

## 2022-10-28 DIAGNOSIS — R7303 Prediabetes: Secondary | ICD-10-CM | POA: Insufficient documentation

## 2022-10-28 DIAGNOSIS — Z6835 Body mass index (BMI) 35.0-35.9, adult: Secondary | ICD-10-CM | POA: Diagnosis not present

## 2022-10-28 DIAGNOSIS — E669 Obesity, unspecified: Secondary | ICD-10-CM

## 2022-10-28 DIAGNOSIS — E559 Vitamin D deficiency, unspecified: Secondary | ICD-10-CM | POA: Insufficient documentation

## 2022-10-28 MED ORDER — VITAMIN D (ERGOCALCIFEROL) 1.25 MG (50000 UNIT) PO CAPS: 50000.0000 [IU] | ORAL_CAPSULE | ORAL | 0 refills | Status: DC

## 2022-10-28 MED ORDER — METFORMIN HCL 500 MG PO TABS: 500.0000 mg | ORAL_TABLET | Freq: Two times a day (BID) | ORAL | 0 refills | Status: DC

## 2022-10-28 NOTE — Progress Notes (Deleted)
Office: [phone]  /  Fax: [phone]  WEIGHT SUMMARY AND BIOMETRICS  Vitals Temp: 98.1 F (36.7 C) BP: 109/74 Pulse Rate: 79 SpO2: 98 %   Anthropometric Measurements Height: 5\' 4"  (1.626 m) Weight: 206 lb (93.4 kg) BMI (Calculated): 35.34 Weight at Last Visit: 209 lb Weight Lost Since Last Visit: 3 lb Weight Gained Since Last Visit: 0 Starting Weight: 214 lb Total Weight Loss (lbs): 8 lb (3.629 kg) Peak Weight: 225 lb   Body Composition  Body Fat %: 40.2 % Fat Mass (lbs): 82.8 lbs Muscle Mass (lbs): 117 lbs Total Body Water (lbs): 80 lbs Visceral Fat Rating : 9    No data recorded Today's Visit #: 4  Starting Date: 09/15/22   HPI  Chief Complaint: OBESITY   is here to discuss her progress with her obesity treatment plan. She is on the the Category 3 Plan and states she is following her eating plan approximately 70-80 % of the time. She states she is exercising roller skates, skate class, swimming 30 minutes 3 times per week.  Interval History:  Since last office visit she has []  lost []  maintained [] gained weight.  Adherence to nutrition plan :  []  Excellent []  Good []  Fair []  Suboptimal []  Variable []  Gradual implementation [] Has not started implementation  Nutritional: She has been working on [employer] Control: [] Denies [] Reports problems with appetite and hunger signals.  [] Denies [] Reports problems with satiety and satiation.  [] Denies [] Reports problems with eating patterns and portion control.  [] Denies [] Reports strong cravings for highly palatable foods [] Denies [] Reports problems with feeling restricted or deprived  Stress levels: []  Low [] Medium [] High   Barriers identified: {EMOBESITYBARRIERS:28841::"none"}.   Pharmacotherapy for weight loss: She is currently taking {EMPharmaco:28845::"no anti-obesity medication"}.    ASSESSMENT AND PLAN  TREATMENT PLAN FOR OBESITY:  Recommended Dietary  Goals  Bunny is currently in the action stage of change. As such, her goal is to continue weight management plan. She has agreed to: {EMWTLOSSPLAN:29297::"continue current plan"}  Behavioral Intervention  We discussed the following Behavioral Modification Strategies today: {EMWMwtlossstrategies:28914::"increasing lean protein intake","decreasing simple carbohydrates ","increasing vegetables","increasing lower glycemic fruits","increasing water intake","continue to practice mindfulness when eating","planning for success"}.  Additional resources provided today: {EMadditionalresources:29169::"None"}  Recommended Physical Activity Goals  Ariellah has been advised to work up to 150 minutes of moderate intensity aerobic activity a week and strengthening exercises 2-3 times per week for cardiovascular health, weight loss maintenance and preservation of muscle mass.   She has agreed to :  {EMEXERCISE:28847::"Think about ways to increase daily physical activity and overcoming barriers to exercise"}  Pharmacotherapy We have discussed various medication options to help Jullianna with her weight loss efforts and we both agreed to : {EMagreedrx:29170::"continue with nutritional and behavioral strategies"}  ASSOCIATED CONDITIONS ADDRESSED TODAY  Obesity with starting BMI of 36.8  Prediabetes Assessment & Plan: Most recent A1c is  Lab Results  Component Value Date   HGBA1C 5.7 (H) 09/15/2022   HGBA1C 5.5 02/13/2015    Patient aware of disease state and risk of progression. This may contribute to abnormal cravings, fatigue and diabetic complications without having diabetes.   We reviewed treatment options which includes losing 7 to 10% of body weight, increasing physical activity to a goal of 150 minutes a week at moderate intensity.  After discussion of benefits and SE she is agreeable to increasing metformin to twice for incretin effect and pharmacoprophylaxis.  Orders: -      metFORMIN HCl; Take 1 tablet (500  mg total) by mouth 2 (two) times daily with a meal.  Dispense: 60 tablet; Refill: 0  Vitamin D deficiency Assessment & Plan: Most recent vitamin D levels  Lab Results  Component Value Date   VD25OH 32.6 09/15/2022     Deficiency state associated with adiposity and may result in leptin resistance, weight gain and fatigue. Currently on vitamin D supplementation without any adverse effects.  Plan:Continue supplementation for 4 months and recheck levels .   Orders: -     Vitamin D (Ergocalciferol); Take 1 capsule (50,000 Units total) by mouth every 7 (seven) days.  Dispense: 4 capsule; Refill: 0    PHYSICAL EXAM:  Blood pressure 109/74, pulse 79, temperature 98.1 F (36.7 C), height 5\' 4"  (1.626 m), weight 206 lb (93.4 kg), last menstrual period 09/08/2022, SpO2 98 %, currently breastfeeding. Body mass index is 35.36 kg/m.  General: She is overweight, cooperative, alert, well developed, and in no acute distress. PSYCH: Has normal mood, affect and thought process.   HEENT: EOMI, sclerae are anicteric. Lungs: Normal breathing effort, no conversational dyspnea. Extremities: No edema.  Neurologic: No gross sensory or motor deficits. No tremors or fasciculations noted.    DIAGNOSTIC DATA REVIEWED:  BMET    Component Value Date/Time   NA 139 09/15/2022 0939   K 4.3 09/15/2022 0939   CL 102 09/15/2022 0939   CO2 22 09/15/2022 0939   GLUCOSE 81 09/15/2022 0939   GLUCOSE 73 01/06/2022 1518   BUN 9 09/15/2022 0939   CREATININE 0.71 09/15/2022 0939   CALCIUM 9.1 09/15/2022 0939   GFRNONAA >60 05/17/2016 0453   GFRAA >60 05/17/2016 0453   EGFR 115 09/15/2022 0939   Lab Results  Component Value Date   HGBA1C 5.7 (H) 09/15/2022   HGBA1C 5.5 02/13/2015   Lab Results  Component Value Date   INSULIN 20.1 09/15/2022   Lab Results  Component Value Date   TSH 2.400 09/15/2022   CBC    Component Value Date/Time   WBC 7.7 09/15/2022 0939    WBC 9.5 01/06/2022 1518   RBC 4.52 09/15/2022 0939   RBC 4.61 01/06/2022 1518   HGB 12.9 09/15/2022 0939   HCT 39.2 09/15/2022 0939   PLT 334 09/15/2022 0939   MCV 87 09/15/2022 0939   MCH 28.5 09/15/2022 0939   MCH 29.6 12/12/2019 0600   MCHC 32.9 09/15/2022 0939   MCHC 33.2 01/06/2022 1518   RDW 13.2 09/15/2022 0939   Iron Studies    Component Value Date/Time   IRON 54 06/12/2020 1045   FERRITIN 12.1 06/12/2020 1045   IRONPCTSAT 11.7 (L) 06/12/2020 1045   Lipid Panel     Component Value Date/Time   CHOL 191 09/15/2022 0939   TRIG 127 09/15/2022 0939   HDL 45 09/15/2022 0939   CHOLHDL 4 06/12/2020 1045   VLDL 27.8 06/12/2020 1045   LDLCALC 123 (H) 09/15/2022 0939   LDLDIRECT 150.0 02/13/2015 1449   Hepatic Function Panel     Component Value Date/Time   PROT 7.2 09/15/2022 0939   ALBUMIN 4.4 09/15/2022 0939   AST 11 09/15/2022 0939   ALT 15 09/15/2022 0939   ALKPHOS 61 09/15/2022 0939   BILITOT 0.2 09/15/2022 0939   BILIDIR 0.1 01/06/2022 1518      Component Value Date/Time   TSH 2.400 09/15/2022 0939   Nutritional Lab Results  Component Value Date   VD25OH 32.6 09/15/2022     Return in about 2 weeks (around 11/11/2022) for Dr. Lawson Radar.. She  was informed of the importance of frequent follow up visits to maximize her success with intensive lifestyle modifications for her multiple health conditions.   ATTESTASTION STATEMENTS:  Reviewed by clinician on day of visit: allergies, medications, problem list, medical history, surgical history, family history, social history, and previous encounter notes.     Worthy Rancher, MD

## 2022-10-28 NOTE — Assessment & Plan Note (Signed)
Most recent vitamin D levels  Lab Results  Component Value Date   VD25OH 32.6 09/15/2022     Deficiency state associated with adiposity and may result in leptin resistance, weight gain and fatigue. Currently on vitamin D supplementation without any adverse effects.  Plan:Continue supplementation for 4 months and recheck levels .

## 2022-10-28 NOTE — Assessment & Plan Note (Signed)
Most recent A1c is  Lab Results  Component Value Date   HGBA1C 5.7 (H) 09/15/2022   HGBA1C 5.5 02/13/2015    Patient aware of disease state and risk of progression. This may contribute to abnormal cravings, fatigue and diabetic complications without having diabetes.   We reviewed treatment options which includes losing 7 to 10% of body weight, increasing physical activity to a goal of 150 minutes a week at moderate intensity.  After discussion of benefits and SE she is agreeable to increasing metformin to twice for incretin effect and pharmacoprophylaxis.

## 2022-10-28 NOTE — Progress Notes (Signed)
Office: [phone]  /  Fax: [phone]  WEIGHT SUMMARY AND BIOMETRICS  Vitals Temp: 98.1 F (36.7 C) BP: 109/74 Pulse Rate: 79 SpO2: 98 %   Anthropometric Measurements Height: 5\' 4"  (1.626 m) Weight: 206 lb (93.4 kg) BMI (Calculated): 35.34 Weight at Last Visit: 209 lb Weight Lost Since Last Visit: 3 lb Weight Gained Since Last Visit: 0 Starting Weight: 214 lb Total Weight Loss (lbs): 8 lb (3.629 kg) Peak Weight: 225 lb   Body Composition  Body Fat %: 40.2 % Fat Mass (lbs): 82.8 lbs Muscle Mass (lbs): 117 lbs Total Body Water (lbs): 80 lbs Visceral Fat Rating : 9    No data recorded Today's Visit #: 4  Starting Date: 09/15/22   HPI  Chief Complaint: OBESITY  Sheri [name]  Interval History:  Since last office visit she has lost 3 lbs. She reports good adherence to reduced calorie nutritional plan. She has been working on not skipping meals, increasing protein intake at every meal, avoiding and or reducing liquid calories, making healthier choices, and continues to exercise  [employer] Control: Reports problems with appetite and hunger signals.  Denies problems with satiety and satiation.  Denies problems with eating patterns and portion control.  Reports abnormal cravings for sweets Denies feeling deprived or restricted.   Barriers identified: none.   Pharmacotherapy for weight loss: She is currently taking Metformin (off label use for incretin effect and / or insulin resistance and / or diabetes prevention) with adequate clinical response  and without side effects..    ASSESSMENT AND PLAN  TREATMENT PLAN FOR OBESITY:  Recommended Dietary Goals  [name] is currently in the action stage of change. As such, her goal is to continue weight management plan. She has agreed to: continue current plan  Behavioral Intervention  We discussed the following Behavioral Modification Strategies today: increasing lean protein intake, decreasing  simple carbohydrates , increasing vegetables, increasing lower glycemic fruits, and increasing fiber rich foods.  Additional resources provided today:  Handout on how to make protein smoothie, handout on sources of protein  Recommended Physical Activity Goals  [name] has been advised to work up to 150 minutes of moderate intensity aerobic activity a week and strengthening exercises 2-3 times per week for cardiovascular health, weight loss maintenance and preservation of muscle mass.   She has agreed to :  Think about ways to increase daily physical activity and overcoming barriers to exercise  Pharmacotherapy We discussed various medication options to help [name] with her weight loss efforts and we both agreed to : increase metformin to 500 mg twice daily  ASSOCIATED CONDITIONS ADDRESSED TODAY  Obesity with starting BMI of 36.8  Prediabetes Assessment & Plan: Most recent A1c is  Lab Results  Component Value Date   HGBA1C 5.7 (H) 09/15/2022   HGBA1C 5.5 02/13/2015    Patient aware of disease state and risk of progression. This may contribute to abnormal cravings, fatigue and diabetic complications without having diabetes.   We reviewed treatment options which includes losing 7 to 10% of body weight, increasing physical activity to a goal of 150 minutes a week at moderate intensity.  After discussion of benefits and SE she is agreeable to increasing metformin to twice for incretin effect and pharmacoprophylaxis.  Orders: -     metFORMIN HCl; Take 1 tablet (500 mg total) by mouth 2 (two) times daily with a meal.  Dispense: 60 tablet; Refill: 0  Vitamin D deficiency Assessment & Plan: Most recent vitamin D levels  Lab  Results  Component Value Date   VD25OH 32.6 09/15/2022     Deficiency state associated with adiposity and may result in leptin resistance, weight gain and fatigue. Currently on vitamin D supplementation without any adverse effects.  Plan:Continue  supplementation for 4 months and recheck levels .   Orders: -     Vitamin D (Ergocalciferol); Take 1 capsule (50,000 Units total) by mouth every 7 (seven) days.  Dispense: 4 capsule; Refill: 0    PHYSICAL EXAM:  Blood pressure 109/74, pulse 79, temperature 98.1 F (36.7 C), height 5\' 4"  (1.626 m), weight 206 lb (93.4 kg), last menstrual period 09/08/2022, SpO2 98 %, currently breastfeeding. Body mass index is 35.36 kg/m.  General: She is overweight, cooperative, alert, well developed, and in no acute distress. PSYCH: Has normal mood, affect and thought process.   HEENT: EOMI, sclerae are anicteric. Lungs: Normal breathing effort, no conversational dyspnea. Extremities: No edema.  Neurologic: No gross sensory or motor deficits. No tremors or fasciculations noted.    DIAGNOSTIC DATA REVIEWED:  BMET    Component Value Date/Time   NA 139 09/15/2022 0939   K 4.3 09/15/2022 0939   CL 102 09/15/2022 0939   CO2 22 09/15/2022 0939   GLUCOSE 81 09/15/2022 0939   GLUCOSE 73 01/06/2022 1518   BUN 9 09/15/2022 0939   CREATININE 0.71 09/15/2022 0939   CALCIUM 9.1 09/15/2022 0939   GFRNONAA >60 05/17/2016 0453   GFRAA >60 05/17/2016 0453   Lab Results  Component Value Date   HGBA1C 5.7 (H) 09/15/2022   HGBA1C 5.5 02/13/2015   Lab Results  Component Value Date   INSULIN 20.1 09/15/2022   Lab Results  Component Value Date   TSH 2.400 09/15/2022   CBC    Component Value Date/Time   WBC 7.7 09/15/2022 0939   WBC 9.5 01/06/2022 1518   RBC 4.52 09/15/2022 0939   RBC 4.61 01/06/2022 1518   HGB 12.9 09/15/2022 0939   HCT 39.2 09/15/2022 0939   PLT 334 09/15/2022 0939   MCV 87 09/15/2022 0939   MCH 28.5 09/15/2022 0939   MCH 29.6 12/12/2019 0600   MCHC 32.9 09/15/2022 0939   MCHC 33.2 01/06/2022 1518   RDW 13.2 09/15/2022 0939   Iron Studies    Component Value Date/Time   IRON 54 06/12/2020 1045   FERRITIN 12.1 06/12/2020 1045   IRONPCTSAT 11.7 (L) 06/12/2020 1045    Lipid Panel     Component Value Date/Time   CHOL 191 09/15/2022 0939   TRIG 127 09/15/2022 0939   HDL 45 09/15/2022 0939   CHOLHDL 4 06/12/2020 1045   VLDL 27.8 06/12/2020 1045   LDLCALC 123 (H) 09/15/2022 0939   LDLDIRECT 150.0 02/13/2015 1449   Hepatic Function Panel     Component Value Date/Time   PROT 7.2 09/15/2022 0939   ALBUMIN 4.4 09/15/2022 0939   AST 11 09/15/2022 0939   ALT 15 09/15/2022 0939   ALKPHOS 61 09/15/2022 0939   BILITOT 0.2 09/15/2022 0939   BILIDIR 0.1 01/06/2022 1518      Component Value Date/Time   TSH 2.400 09/15/2022 0939   Nutritional Lab Results  Component Value Date   VD25OH 32.6 09/15/2022     Return in about 2 weeks (around 11/11/2022) for Dr. Lawson Radar.. She was informed of the importance of frequent follow up visits to maximize her success with intensive lifestyle modifications for her multiple health conditions.   ATTESTASTION STATEMENTS:  Reviewed by clinician on day of visit: allergies,  medications, problem list, medical history, surgical history, family history, social history, and previous encounter notes.     Worthy Rancher, MD

## 2022-11-02 DIAGNOSIS — F4320 Adjustment disorder, unspecified: Secondary | ICD-10-CM | POA: Diagnosis not present

## 2022-11-07 DIAGNOSIS — F330 Major depressive disorder, recurrent, mild: Secondary | ICD-10-CM | POA: Diagnosis not present

## 2022-11-09 ENCOUNTER — Ambulatory Visit (INDEPENDENT_AMBULATORY_CARE_PROVIDER_SITE_OTHER): Payer: 59 | Admitting: Family Medicine

## 2022-11-09 ENCOUNTER — Encounter (INDEPENDENT_AMBULATORY_CARE_PROVIDER_SITE_OTHER): Payer: Self-pay | Admitting: Family Medicine

## 2022-11-09 VITALS — BP 108/68 | HR 80 | Temp 98.4°F | Ht 64.0 in | Wt 211.0 lb

## 2022-11-09 DIAGNOSIS — F4320 Adjustment disorder, unspecified: Secondary | ICD-10-CM | POA: Diagnosis not present

## 2022-11-09 DIAGNOSIS — Z6836 Body mass index (BMI) 36.0-36.9, adult: Secondary | ICD-10-CM

## 2022-11-09 DIAGNOSIS — E669 Obesity, unspecified: Secondary | ICD-10-CM | POA: Diagnosis not present

## 2022-11-09 DIAGNOSIS — R7303 Prediabetes: Secondary | ICD-10-CM

## 2022-11-09 DIAGNOSIS — E559 Vitamin D deficiency, unspecified: Secondary | ICD-10-CM

## 2022-11-09 MED ORDER — VITAMIN D (ERGOCALCIFEROL) 1.25 MG (50000 UNIT) PO CAPS: 50000.0000 [IU] | ORAL_CAPSULE | ORAL | 0 refills | Status: DC

## 2022-11-09 MED ORDER — METFORMIN HCL 500 MG PO TABS
500.0000 mg | ORAL_TABLET | Freq: Two times a day (BID) | ORAL | 0 refills | Status: DC
Start: 2022-11-09 — End: 2022-12-08

## 2022-11-09 NOTE — Progress Notes (Unsigned)
Chief Complaint:   OBESITY Sheri [name] is here to discuss her progress with her obesity treatment plan along with follow-up of her obesity related diagnoses. Sheri [name] is on the Category 3 Plan and states she is following her eating plan approximately 40-50% of the time. Sheri [name] states she is skating and swimming for 30 minutes 1-3 times per week.  Today's visit was #: 5 Starting weight: 214 lbs Starting date: 09/15/2022 Today's weight: 211 lbs Today's date: 11/09/2022 Total lbs lost to date: 3 Total lbs lost since last in-office visit: 0  Interim History: Patient voices that during the week she does well following the plan then struggles to stay adherent on weekends.  She has also been craving sweet foods and hasn't been able to control her intake. Patient is starting a new job next week and mentions this will encourage her to be more consistent with meal planning and food choices.  Her husband did the info session last week.   Subjective:   1. Vitamin D deficiency Patient is on prescription Vitamin D. She denies nausea, vomiting, or muscle weakness but notes fatigue. Last Vitamin D level was of 32.6.  2. Prediabetes Patient's recent A1c was 5.7 and insulin 20.1. She is on metformin.  Assessment/Plan:   1. Vitamin D deficiency We will refill prescription Vitamin D once weekly for 1 month.   - Vitamin D, Ergocalciferol, (DRISDOL) 1.25 MG (50000 UNIT) CAPS capsule; Take 1 capsule (50,000 Units total) by mouth every 7 (seven) days.  Dispense: 4 capsule; Refill: 0  2. Prediabetes We will refill metformin 500 mg BID for 1 month.   - metFORMIN (GLUCOPHAGE) 500 MG tablet; Take 1 tablet (500 mg total) by mouth 2 (two) times daily with a meal.  Dispense: 60 tablet; Refill: 0  3. BMI 36.0-36.9,adult  4. Obesity with starting BMI of 36.8 Sheri [name] is currently in the action stage of change. As such, her goal is to continue with weight loss efforts. She has agreed to the Category 3  Plan.   Exercise goals: As is.   Behavioral modification strategies: increasing lean protein intake, meal planning and cooking strategies, keeping healthy foods in the home, and planning for success.  Sheri [name] has agreed to follow-up with our clinic in 3 weeks. She was informed of the importance of frequent follow-up visits to maximize her success with intensive lifestyle modifications for her multiple health conditions.   Objective:   Blood pressure 108/68, pulse 80, temperature 98.4 F (36.9 C), height 5\' 4"  (1.626 m), weight 211 lb (95.7 kg), last menstrual period 09/08/2022, SpO2 99%, currently breastfeeding. Body mass index is 36.22 kg/m.  General: Cooperative, alert, well developed, in no acute distress. HEENT: Conjunctivae and lids unremarkable. Cardiovascular: Regular rhythm.  Lungs: Normal work of breathing. Neurologic: No focal deficits.   Lab Results  Component Value Date   CREATININE 0.71 09/15/2022   BUN 9 09/15/2022   NA 139 09/15/2022   K 4.3 09/15/2022   CL 102 09/15/2022   CO2 22 09/15/2022   Lab Results  Component Value Date   ALT 15 09/15/2022   AST 11 09/15/2022   ALKPHOS 61 09/15/2022   BILITOT 0.2 09/15/2022   Lab Results  Component Value Date   HGBA1C 5.7 (H) 09/15/2022   HGBA1C 6.3 01/06/2022   HGBA1C 5.8 06/12/2020   HGBA1C 5.4 05/23/2019   HGBA1C 5.5 10/20/2018   Lab Results  Component Value Date   INSULIN 20.1 09/15/2022   Lab Results  Component Value Date  TSH 2.400 09/15/2022   Lab Results  Component Value Date   CHOL 191 09/15/2022   HDL 45 09/15/2022   LDLCALC 123 (H) 09/15/2022   LDLDIRECT 150.0 02/13/2015   TRIG 127 09/15/2022   CHOLHDL 4 06/12/2020   Lab Results  Component Value Date   VD25OH 32.6 09/15/2022   Lab Results  Component Value Date   WBC 7.7 09/15/2022   HGB 12.9 09/15/2022   HCT 39.2 09/15/2022   MCV 87 09/15/2022   PLT 334 09/15/2022   Lab Results  Component Value Date   IRON 54 06/12/2020    FERRITIN 12.1 06/12/2020   Attestation Statements:   Reviewed by clinician on day of visit: allergies, medications, problem list, medical history, surgical history, family history, social history, and previous encounter notes.   I, Burt Knack, am acting as transcriptionist for Reuben Likes, MD  I have reviewed the above documentation for accuracy and completeness, and I agree with the above. - Reuben Likes, MD

## 2022-11-10 ENCOUNTER — Other Ambulatory Visit (HOSPITAL_COMMUNITY): Payer: Self-pay | Admitting: Psychiatry

## 2022-11-10 DIAGNOSIS — F39 Unspecified mood [affective] disorder: Secondary | ICD-10-CM

## 2022-11-10 DIAGNOSIS — F419 Anxiety disorder, unspecified: Secondary | ICD-10-CM

## 2022-11-11 ENCOUNTER — Other Ambulatory Visit (HOSPITAL_COMMUNITY): Payer: Self-pay | Admitting: Psychiatry

## 2022-11-11 DIAGNOSIS — F419 Anxiety disorder, unspecified: Secondary | ICD-10-CM

## 2022-11-13 ENCOUNTER — Telehealth (HOSPITAL_BASED_OUTPATIENT_CLINIC_OR_DEPARTMENT_OTHER): Payer: 59 | Admitting: Psychiatry

## 2022-11-13 ENCOUNTER — Encounter (HOSPITAL_COMMUNITY): Payer: Self-pay | Admitting: Psychiatry

## 2022-11-13 VITALS — Wt 211.0 lb

## 2022-11-13 DIAGNOSIS — F419 Anxiety disorder, unspecified: Secondary | ICD-10-CM | POA: Diagnosis not present

## 2022-11-13 DIAGNOSIS — F39 Unspecified mood [affective] disorder: Secondary | ICD-10-CM | POA: Diagnosis not present

## 2022-11-13 MED ORDER — BUSPIRONE HCL 15 MG PO TABS: 15.0000 mg | ORAL_TABLET | Freq: Two times a day (BID) | ORAL | 0 refills | Status: DC

## 2022-11-13 MED ORDER — SERTRALINE HCL 50 MG PO TABS: 50.0000 mg | ORAL_TABLET | Freq: Every day | ORAL | 0 refills | Status: DC

## 2022-11-13 MED ORDER — LAMOTRIGINE 25 MG PO TABS
ORAL_TABLET | ORAL | 0 refills | Status: DC
Start: 2022-11-13 — End: 2023-02-12

## 2022-11-13 NOTE — Progress Notes (Signed)
[facility] MD Virtual Progress Note   Patient Location: Work Provider Location: Home Office  I connect with patient by video and verified that I am speaking with correct person by using two identifiers. I discussed the limitations of evaluation and management by telemedicine and the availability of in person appointments. I also discussed with the patient that there may be a patient responsible charge related to this service. The patient expressed understanding and agreed to proceed.  Sheri [name] [id number] 32 y.o.  11/13/2022 8:46 AM  History of Present Illness:  Patient is evaluated by video session.  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  She is happy about it.  She feels the current medicine is working and she is not having any mood swing, anger, irritability or any crying spells.  He is not taking Lamictal 50 mg twice a day along with BuSpar 15 mg twice a day and Zoloft 50 mg daily.  She denies any rash or any itching but reported noticed sometimes dry mouth and dry eyes.  She is not sure when it started and usually these concerns are not intense and does not interfere in her daily life.  She is happy as a started weight loss program and able to lose weight since the last visit.  She feels more active.  She denies any crying spells or any feeling of hopelessness or worthlessness.  She feels increased to help with anxiety, mood and she is able to handle the stress better than before.  Patient lives with her 43 year old stepson and 23-year-old who is going to be 43-year-old very soon.  She had a good support from her husband.  Patient denies drinking or using any illegal substances.  She has no other major concern from the medication.  Past Psychiatric History: H/O depression, anxiety, Bipolar, post partum and ADHD. On medication since age 43.  H/O addiction with opiates, Benzo and  Adderall. Received therapy and medications at [facility] counseling and [facility] but not happy with care. Had rehab at [facility] in 2015.  Did well for 5 years without medication until symptoms returns. H/O suicidal thoughts but no attempt. No psychosis.  Tried Celexa caused dizziness, trazodone, weight gain.  Took Lamictal, Klonopin, Lexapro, hydroxyzine, Ambien, Seroquel, Latuda, Hydroxyzine and Wellbutrin.        Outpatient Encounter Medications as of 11/13/2022  Medication Sig   albuterol (PROAIR HFA) 108 (90 Base) MCG/ACT inhaler Inhale 2 puffs into the lungs every 6 (six) hours as needed for wheezing or shortness of breath.   aspirin EC 81 MG tablet Take 81 mg by mouth daily. Swallow whole.   busPIRone (BUSPAR) 15 MG tablet Take 1 tablet (15 mg total) by mouth 2 (two) times daily.   cetirizine (ZYRTEC) 10 MG tablet Take 10 mg by mouth daily.   lamoTRIgine (LAMICTAL) 25 MG tablet Take two tablet by mouth twice times daily.   metFORMIN (GLUCOPHAGE) 500 MG tablet Take 1 tablet (500 mg total) by mouth 2 (two) times daily with a meal.   sertraline (ZOLOFT) 50 MG tablet Take 1 tablet (50 mg total) by mouth daily.   SUMAtriptan (IMITREX) 25 MG tablet TAKE 1 TABLET BY MOUTH AT START OF MIGRAINE.   tiZANidine (ZANAFLEX) 4 MG tablet TAKE 1 TABLET BY MOUTH AT BEDTIME.   Vitamin D, Ergocalciferol, (DRISDOL) 1.25 MG (50000 UNIT) CAPS capsule Take 1 capsule (50,000 Units total) by  mouth every 7 (seven) days.   No facility-administered encounter medications on file as of 11/13/2022.    Recent Results (from the past 2160 hour(s))  Vitamin B12     Status: None   Collection Time: 09/15/22  9:39 AM  Result Value Ref Range   Vitamin B-12 478 232 - 1,245 pg/mL  CBC with Differential/Platelet     Status: None   Collection Time: 09/15/22  9:39 AM  Result Value Ref Range   WBC 7.7 3.4 - 10.8 x10E3/uL   RBC 4.52 3.77 - 5.28 x10E6/uL   Hemoglobin 12.9 11.1 - 15.9 g/dL   Hematocrit 55.7 32.2  - 46.6 %   MCV 87 79 - 97 fL   MCH 28.5 26.6 - 33.0 pg   MCHC 32.9 31.5 - 35.7 g/dL   RDW 02.5 42.7 - 06.2 %   Platelets 334 150 - 450 x10E3/uL   Neutrophils 63 Not Estab. %   Lymphs 26 Not Estab. %   Monocytes 8 Not Estab. %   Eos 2 Not Estab. %   Basos 1 Not Estab. %   Neutrophils Absolute 4.9 1.4 - 7.0 x10E3/uL   Lymphocytes Absolute 2.0 0.7 - 3.1 x10E3/uL   Monocytes Absolute 0.6 0.1 - 0.9 x10E3/uL   EOS (ABSOLUTE) 0.2 0.0 - 0.4 x10E3/uL   Basophils Absolute 0.0 0.0 - 0.2 x10E3/uL   Immature Granulocytes 0 Not Estab. %   Immature Grans (Abs) 0.0 0.0 - 0.1 x10E3/uL  Comprehensive metabolic panel     Status: None   Collection Time: 09/15/22  9:39 AM  Result Value Ref Range   Glucose 81 70 - 99 mg/dL   BUN 9 6 - 20 mg/dL   Creatinine, Ser 3.76 0.57 - 1.00 mg/dL   eGFR 283 >15 VV/OHY/0.73   BUN/Creatinine Ratio 13 9 - 23   Sodium 139 134 - 144 mmol/L   Potassium 4.3 3.5 - 5.2 mmol/L   Chloride 102 96 - 106 mmol/L   CO2 22 20 - 29 mmol/L   Calcium 9.1 8.7 - 10.2 mg/dL   Total Protein 7.2 6.0 - 8.5 g/dL   Albumin 4.4 3.9 - 4.9 g/dL   Globulin, Total 2.8 1.5 - 4.5 g/dL   Albumin/Globulin Ratio 1.6 1.2 - 2.2   Bilirubin Total 0.2 0.0 - 1.2 mg/dL   Alkaline Phosphatase 61 44 - 121 IU/L   AST 11 0 - 40 IU/L   ALT 15 0 - 32 IU/L  Folate     Status: None   Collection Time: 09/15/22  9:39 AM  Result Value Ref Range   Folate 13.1 >3.0 ng/mL    Comment: A serum folate concentration of less than 3.1 ng/mL is considered to represent clinical deficiency.   Hemoglobin A1c     Status: Abnormal   Collection Time: 09/15/22  9:39 AM  Result Value Ref Range   Hgb A1c MFr Bld 5.7 (H) 4.8 - 5.6 %    Comment:          Prediabetes: 5.7 - 6.4          Diabetes: >6.4          Glycemic control for adults with diabetes: <7.0    Est. average glucose Bld gHb Est-mCnc 117 mg/dL  Insulin, random     Status: None   Collection Time: 09/15/22  9:39 AM  Result Value Ref Range   INSULIN 20.1 2.6  - 24.9 uIU/mL  Lipid Panel With LDL/HDL Ratio     Status: Abnormal  Collection Time: 09/15/22  9:39 AM  Result Value Ref Range   Cholesterol, Total 191 100 - 199 mg/dL   Triglycerides 528 0 - 149 mg/dL   HDL 45 >41 mg/dL   VLDL Cholesterol Cal 23 5 - 40 mg/dL   LDL Chol Calc (NIH) 324 (H) 0 - 99 mg/dL   LDL/HDL Ratio 2.7 0.0 - 3.2 ratio    Comment:                                     LDL/HDL Ratio                                             Men  Women                               1/2 Avg.Risk  1.0    1.5                                   Avg.Risk  3.6    3.2                                2X Avg.Risk  6.2    5.0                                3X Avg.Risk  8.0    6.1   VITAMIN D 25 Hydroxy (Vit-D Deficiency, Fractures)     Status: None   Collection Time: 09/15/22  9:39 AM  Result Value Ref Range   Vit D, 25-Hydroxy 32.6 30.0 - 100.0 ng/mL    Comment: Vitamin D deficiency has been defined by the Institute of Medicine and an Endocrine Society practice guideline as a level of serum 25-OH vitamin D less than 20 ng/mL (1,2). The Endocrine Society went on to further define vitamin D insufficiency as a level between 21 and 29 ng/mL (2). 1. IOM (Institute of Medicine). 2010. Dietary reference    intakes for calcium and D. [place]: The    [employer]. 2. Holick MF, Binkley Gantt, Bischoff-Ferrari HA, et al.    Evaluation, treatment, and prevention of vitamin D    deficiency: an Endocrine Society clinical practice    guideline. JCEM. 2011 Jul; 96(7):1911-30.   TSH     Status: None   Collection Time: 09/15/22  9:39 AM  Result Value Ref Range   TSH 2.400 0.450 - 4.500 uIU/mL  T4, free     Status: None   Collection Time: 09/15/22  9:39 AM  Result Value Ref Range   Free T4 1.26 0.82 - 1.77 ng/dL  T3     Status: None   Collection Time: 09/15/22  9:39 AM  Result Value Ref Range   T3, Total 137 71 - 180 ng/dL     Psychiatric Specialty Exam: Physical Exam  Review of  Systems  Constitutional:        Dry moth and dry eyes    Weight 211 lb (95.7 kg), last menstrual period 09/08/2022, currently breastfeeding.There is no height  or weight on file to calculate BMI.  General Appearance: Casual  Eye Contact:  Good  Speech:  Clear and Coherent  Volume:  Normal  Mood:  Euthymic  Affect:  Congruent  Thought Process:  Goal Directed  Orientation:  Full (Time, Place, and Person)  Thought Content:  Logical  Suicidal Thoughts:  No  Homicidal Thoughts:  No  Memory:  Immediate;   Good Recent;   Good Remote;   Good  Judgement:  Good  Insight:  Good  Psychomotor Activity:  Normal  Concentration:  Concentration: Good and Attention Span: Good  Recall:  Good  Fund of Knowledge:  Good  Language:  Good  Akathisia:  No  Handed:  Right  AIMS (if indicated):     Assets:  Communication Skills Desire for Improvement Housing Resilience Social Support Talents/Skills Transportation  ADL's:  Intact  Cognition:  WNL  Sleep: Okay     Assessment/Plan: Mood disorder (HCC) - Plan: lamoTRIgine (LAMICTAL) 25 MG tablet, sertraline (ZOLOFT) 50 MG tablet  Anxiety - Plan: busPIRone (BUSPAR) 15 MG tablet, lamoTRIgine (LAMICTAL) 25 MG tablet, sertraline (ZOLOFT) 50 MG tablet  Patient doing better with adjustment of Lamictal dose.  She is tolerating very well and reported no side effects.  She is in therapy with Tresa Endo at [facility] for EM[facility].  Her anxiety and irritability is better.  She lost weight since started the weight loss program.  I discussed about dry and dry mouth which could be medication related but patient does not want to change the medication since it is working well.  We will continue Lamictal 50 mg 2 times a day, Zoloft 50 mg daily and BuSpar 15 mg 2 times a day.  Patient requested a 90-day prescription which was sent to her pharmacy.  Recommend to call us back if she has any question or any concern.  Follow-up in 3 months   Follow Up Instructions:     I  discussed the assessment and treatment plan with the patient. The patient was provided an opportunity to ask questions and all were answered. The patient agreed with the plan and demonstrated an understanding of the instructions.   The patient was advised to call back or seek an in-person evaluation if the symptoms worsen or if the condition fails to improve as anticipated.    Collaboration of Care: Other provider involved in patient's care AEB 's are available in epic to review.  Patient/Guardian was advised Release of Information must be obtained prior to any record release in order to collaborate their care with an outside provider. Patient/Guardian was advised if they have not already done so to contact the registration department to sign all necessary forms in order for [name] to release information regarding their care.   Consent: Patient/Guardian gives verbal consent for treatment and assignment of benefits for services provided during this visit. Patient/Guardian expressed understanding and agreed to proceed.     I provided 21 minutes of non face to face time during this encounter.  Note: This document was prepared by [employer] voice dictation technology and any errors that results from this process are unintentional.    Cleotis Nipper, MD 11/13/2022

## 2022-11-16 DIAGNOSIS — F4320 Adjustment disorder, unspecified: Secondary | ICD-10-CM | POA: Diagnosis not present

## 2022-11-21 DIAGNOSIS — F330 Major depressive disorder, recurrent, mild: Secondary | ICD-10-CM | POA: Diagnosis not present

## 2022-11-25 NOTE — Progress Notes (Signed)
[name] [address] [place] [address] Phone: [phone] Subjective:   [name], am serving as a scribe for Dr. Antoine Primas.  I'm seeing this patient by the request  of:  Panosh, Neta Mends, MD  CC: back and neck pain follow up   [id number]  Sheri [name] is a 32 y.o. female coming in with complaint of back and neck pain. OMT 10/16/2022. Patient states doing well. Same per usual. No new concerns. Been puddle jumping noted   Medications patient has been prescribed: Zanaflex  Taking:      Reviewed prior external information including notes and imaging from previsou exam, outside providers and external EMR if available.  Reviewed patient's notes and continues to be seen by metabolic medicine and patient was down 3 pounds at her last check in  As well as notes that were available from care everywhere and other healthcare systems.  Past medical history, social, surgical and family history all reviewed in electronic medical record.  No pertanent information unless stated regarding to the chief complaint.   Past Medical History:  Diagnosis Date   Alcohol addiction (HCC)    Anemia    Anxiety    Back pain    Bipolar depression (HCC)    under psych rx    Carpal tunnel syndrome    Chest pain    Depression    Drug addiction in remission [facility])    heroin   Enteritis due to Norovirus 05/18/2016   Heartburn    Hx of pulmonary embolus 02/13/2015   Hyperlipidemia    Hypokalemia 05/17/2016   IUD (intrauterine device) in place 02/13/2015   Joint pain    Mild mitral regurgitation    Mild tricuspid regurgitation    Mitral regurgitation 06/13/2018   Near syncope 05/16/2016   a. felt due to norovirus.   Nonallopathic lesion of cervical region 09/01/2018   Nonallopathic lesion of rib cage 09/01/2018   Obesity    Orthostatic hypotension 05/18/2016   Palpitations    PCOS (polycystic ovarian syndrome) 02/13/2015   Prediabetes     Pulmonary embolism (HCC) 2011   neg heme evaluation  felt to be from ocps    Scapular dyskinesis 09/01/2018   Smoker 12/30/2016   Syncope and collapse 05/16/2016   Thoracic outlet syndrome of left thoracic outlet 02/15/2018   Thoracic radiculopathy due to degenerative joint disease of spine 10/13/2016   CT and XR show at least 3 levels of DDD of thoracic spine  I suspect this is related to both the scoliosis and the history of dance    Allergies  Allergen Reactions   Bupropion Other (See Comments)     Review of Systems:  No headache, visual changes, nausea, vomiting, diarrhea, constipation, dizziness, abdominal pain, skin rash, fevers, chills, night sweats, weight loss, swollen lymph nodes, body aches, joint swelling, chest pain, shortness of breath, mood changes. POSITIVE muscle aches  Objective  Blood pressure 112/70, pulse 70, height 5\' 4"  (1.626 m), weight 212 lb (96.2 kg), SpO2 97%, currently breastfeeding.   General: No apparent distress alert and oriented x3 mood and affect normal, dressed appropriately.  HEENT: Pupils equal, extraocular movements intact  Respiratory: Patient's speak in full sentences and does not appear short of breath  Cardiovascular: No lower extremity edema, non tender, no erythema  Low back still has loss of lordosis noted.  Some tenderness to palpation in the paraspinal musculature.  This seems to be more on the left sacroiliac  joint  Osteopathic findings  C2 flexed rotated and side bent right C7 flexed rotated and side bent left T3 extended rotated and side bent right inhaled rib T8 extended rotated and side bent left L2 flexed rotated and side bent right Sacrum r left on left     Assessment and Plan:  Thoracic radiculopathy due to degenerative joint disease of spine Discussed HEP  Discussed core strength, continue working on weight loss discussed posture and ergonomics.  Discussed which activities to do and which ones to avoid.  Increase  activity slowly over the course the next several weeks.  Follow-up with me again in 6 to 8 weeks.     Nonallopathic problems  Decision today to treat with OMT was based on Physical Exam  After verbal consent patient was treated with HVLA, ME, FPR techniques in cervical, rib, thoracic, lumbar, and sacral areas  Patient tolerated the procedure well with improvement in symptoms  Patient given exercises, stretches and lifestyle modifications  See medications in patient instructions if given  Patient will follow up in 4-8 weeks     The above documentation has been reviewed and is accurate and complete Judi Saa, DO        Note: This dictation was prepared with Dragon dictation along with smaller phrase technology. Any transcriptional errors that result from this process are unintentional.

## 2022-11-27 ENCOUNTER — Encounter: Payer: Self-pay | Admitting: Family Medicine

## 2022-11-27 ENCOUNTER — Ambulatory Visit: Payer: 59 | Admitting: Family Medicine

## 2022-11-27 VITALS — BP 112/70 | HR 70 | Ht 64.0 in | Wt 212.0 lb

## 2022-11-27 DIAGNOSIS — M9908 Segmental and somatic dysfunction of rib cage: Secondary | ICD-10-CM

## 2022-11-27 DIAGNOSIS — M9902 Segmental and somatic dysfunction of thoracic region: Secondary | ICD-10-CM | POA: Diagnosis not present

## 2022-11-27 DIAGNOSIS — M9904 Segmental and somatic dysfunction of sacral region: Secondary | ICD-10-CM

## 2022-11-27 DIAGNOSIS — M533 Sacrococcygeal disorders, not elsewhere classified: Secondary | ICD-10-CM | POA: Insufficient documentation

## 2022-11-27 DIAGNOSIS — M9901 Segmental and somatic dysfunction of cervical region: Secondary | ICD-10-CM | POA: Diagnosis not present

## 2022-11-27 DIAGNOSIS — M9903 Segmental and somatic dysfunction of lumbar region: Secondary | ICD-10-CM | POA: Diagnosis not present

## 2022-11-27 DIAGNOSIS — M4724 Other spondylosis with radiculopathy, thoracic region: Secondary | ICD-10-CM | POA: Diagnosis not present

## 2022-11-27 NOTE — Assessment & Plan Note (Signed)
Discussed HEP  Discussed core strength, continue working on weight loss discussed posture and ergonomics.  Discussed which activities to do and which ones to avoid.  Increase activity slowly over the course the next several weeks.  Follow-up with me again in 6 to 8 weeks.

## 2022-11-27 NOTE — Patient Instructions (Addendum)
Good to see you! Kids ARE weird Enjoy puddle jumping See you again in 6-8 weeks

## 2022-11-27 NOTE — Assessment & Plan Note (Signed)
New problem at the moment.  Will continue to monitor.  Likely some of that is secondary to cord instability.  Patient is trying to work on weight loss and is going to start swimming here soon.  Follow-up again in 6 to 8 weeks

## 2022-12-01 ENCOUNTER — Encounter: Payer: Self-pay | Admitting: Family Medicine

## 2022-12-01 ENCOUNTER — Ambulatory Visit: Payer: 59 | Admitting: Family Medicine

## 2022-12-01 VITALS — BP 128/88 | HR 84 | Ht 64.0 in | Wt 212.0 lb

## 2022-12-01 DIAGNOSIS — M2550 Pain in unspecified joint: Secondary | ICD-10-CM | POA: Diagnosis not present

## 2022-12-01 DIAGNOSIS — M546 Pain in thoracic spine: Secondary | ICD-10-CM | POA: Diagnosis not present

## 2022-12-01 MED ORDER — METHYLPREDNISOLONE ACETATE 80 MG/ML IJ SUSP
80.0000 mg | Freq: Once | INTRAMUSCULAR | Status: AC
Start: 2022-12-01 — End: 2022-12-01
  Administered 2022-12-01: 80 mg via INTRAMUSCULAR

## 2022-12-01 MED ORDER — KETOROLAC TROMETHAMINE 60 MG/2ML IM SOLN
60.0000 mg | Freq: Once | INTRAMUSCULAR | Status: AC
Start: 2022-12-01 — End: 2022-12-01
  Administered 2022-12-01: 60 mg via INTRAMUSCULAR

## 2022-12-01 NOTE — Assessment & Plan Note (Signed)
Seems the patient did have more of a muscle spasm noted.  Toradol and Depo-Medrol given today.  Did not see any asymmetry that would make me think otherwise.  We discussed with patient about x-rays to further evaluate for any bony abnormality such as rib fracture.  Patient wanted to hold on this at the moment.  Follow-up again in 6 to 8 weeks otherwise.

## 2022-12-01 NOTE — Progress Notes (Signed)
[name] [address] [place] [address] Phone: [phone] Subjective:   Sheri [name], am serving as a scribe for Dr. Antoine [name].  I'm seeing this patient by the request  of:  Panosh, Sheri Mends, MD  CC: Acute left shoulder pain  [id number]  Sheri [name] is a 32 y.o. female coming in with complaint of back and neck pain patient was seen days ago and was given osteopath manipulation.  Feels like she had increasing discomfort and pain could be secondary to her thoracic outlet syndrome.  Does not know if it was secondary to the osteopathic manipulation or not.  Patient states that she developed pain after last visit in L posterior shoulder. C/o constant soreness in this area but experiences sharp pain at other times with no pattern to her pain. Has new desk and new chair at work. Denies radiating symptoms into arm but pain does radiate into the T spine. Hard to take a deep breath when pain is radiating.   Unsure if she injured shoulder. Night before she came in for last visit she was carrying daughter on her shoulders but had pain in other shoulder than next day developed pain in contralateral side.    Medications patient has been prescribed:   Taking:         Reviewed prior external information including notes and imaging from previsou exam, outside providers and external EMR if available.   As well as notes that were available from care everywhere and other healthcare systems.  Past medical history, social, surgical and family history all reviewed in electronic medical record.  No pertanent information unless stated regarding to the chief complaint.   Past Medical History:  Diagnosis Date   Alcohol addiction (HCC)    Anemia    Anxiety    Back pain    Bipolar depression (HCC)    under psych rx    Carpal tunnel syndrome    Chest pain    Depression    Drug addiction in remission [facility])    heroin   Enteritis due to  Norovirus 05/18/2016   Heartburn    Hx of pulmonary embolus 02/13/2015   Hyperlipidemia    Hypokalemia 05/17/2016   IUD (intrauterine device) in place 02/13/2015   Joint pain    Mild mitral regurgitation    Mild tricuspid regurgitation    Mitral regurgitation 06/13/2018   Near syncope 05/16/2016   a. felt due to norovirus.   Nonallopathic lesion of cervical region 09/01/2018   Nonallopathic lesion of rib cage 09/01/2018   Obesity    Orthostatic hypotension 05/18/2016   Palpitations    PCOS (polycystic ovarian syndrome) 02/13/2015   Prediabetes    Pulmonary embolism (HCC) 2011   neg heme evaluation  felt to be from ocps    Scapular dyskinesis 09/01/2018   Smoker 12/30/2016   Syncope and collapse 05/16/2016   Thoracic outlet syndrome of left thoracic outlet 02/15/2018   Thoracic radiculopathy due to degenerative joint disease of spine 10/13/2016   CT and XR show at least 3 levels of DDD of thoracic spine  I suspect this is related to both the scoliosis and the history of dance    Allergies  Allergen Reactions   Bupropion Other (See Comments)     Review of Systems:  No headache, visual changes, nausea, vomiting, diarrhea, constipation, dizziness, abdominal pain, skin rash, fevers, chills, night sweats, weight loss, swollen lymph nodes, body aches, joint swelling, chest pain, shortness  of breath, mood changes. POSITIVE muscle aches  Objective  Blood pressure 128/88, pulse 84, height 5\' 4"  (1.626 m), weight 212 lb (96.2 kg), SpO2 96%, currently breastfeeding.   General: No apparent distress alert and oriented x3 mood and affect normal, dressed appropriately.  HEENT: Pupils equal, extraocular movements intact  Respiratory: Patient's speak in full sentences and does not appear short of breath  Cardiovascular: No lower extremity edema, non tender, no erythema  Left shoulder exam does have good range of motion noted.  No significant impingement noted.  Full range of motion but does  have tightness in the parascapular area.  Does have muscle tightness noted of the perimuscular area that is more of the spine Sheri [name].      Assessment and Plan:  Pain in thoracic spine Seems the patient did have more of a muscle spasm noted.  Toradol and Depo-Medrol given today.  Did not see any asymmetry that would make me think otherwise.  We discussed with patient about x-rays to further evaluate for any bony abnormality such as rib fracture.  Patient wanted to hold on this at the moment.  Follow-up again in 6 to 8 weeks otherwise.           The above documentation has been reviewed and is accurate and complete Judi Saa, DO        Note: This dictation was prepared with Dragon dictation along with smaller phrase technology. Any transcriptional errors that result from this process are unintentional.

## 2022-12-01 NOTE — Patient Instructions (Signed)
See me as scheduled

## 2022-12-08 ENCOUNTER — Ambulatory Visit (INDEPENDENT_AMBULATORY_CARE_PROVIDER_SITE_OTHER): Payer: 59 | Admitting: Family Medicine

## 2022-12-08 ENCOUNTER — Other Ambulatory Visit (HOSPITAL_COMMUNITY): Payer: Self-pay | Admitting: Psychiatry

## 2022-12-08 ENCOUNTER — Encounter (INDEPENDENT_AMBULATORY_CARE_PROVIDER_SITE_OTHER): Payer: Self-pay | Admitting: Family Medicine

## 2022-12-08 VITALS — BP 104/64 | HR 82 | Temp 97.7°F | Ht 64.0 in | Wt 204.0 lb

## 2022-12-08 DIAGNOSIS — Z6835 Body mass index (BMI) 35.0-35.9, adult: Secondary | ICD-10-CM

## 2022-12-08 DIAGNOSIS — E669 Obesity, unspecified: Secondary | ICD-10-CM

## 2022-12-08 DIAGNOSIS — E559 Vitamin D deficiency, unspecified: Secondary | ICD-10-CM | POA: Diagnosis not present

## 2022-12-08 DIAGNOSIS — R7303 Prediabetes: Secondary | ICD-10-CM | POA: Diagnosis not present

## 2022-12-08 DIAGNOSIS — F419 Anxiety disorder, unspecified: Secondary | ICD-10-CM

## 2022-12-08 MED ORDER — VITAMIN D (ERGOCALCIFEROL) 1.25 MG (50000 UNIT) PO CAPS
50000.0000 [IU] | ORAL_CAPSULE | ORAL | 0 refills | Status: DC
Start: 2022-12-08 — End: 2023-02-01

## 2022-12-08 MED ORDER — METFORMIN HCL 500 MG PO TABS
500.0000 mg | ORAL_TABLET | Freq: Two times a day (BID) | ORAL | 0 refills | Status: DC
Start: 2022-12-08 — End: 2023-02-01

## 2022-12-08 NOTE — Progress Notes (Signed)
Chief Complaint:   OBESITY Sheri [name] is here to discuss her progress with her obesity treatment plan along with follow-up of her obesity related diagnoses. Sheri [name] is on the Category 3 Plan and states she is following her eating plan approximately 50% of the time. Sheri [name] states she is walking for 10 minutes 3 times per week.  Today's visit was #: 6 Starting weight: 214 lbs Starting date: 09/15/2022 Today's weight: 204 lbs Today's date: 12/08/2022 Total lbs lost to date: 10 Total lbs lost since last in-office visit: 7  Interim History: Patient started a new job since last appointment.  She is tired but she is enjoying the learning and new routine and schedule.  She is finding it easier to make breakfast and pack lunch.  Food recall: morning is yogurt and fruit, lunch is salad with some protein, dinner is aldi's meat kit with frozen vegetables.  For snack calories she is doing carrots and hummus, yogurt.   Subjective:   1. Prediabetes Patient's last A1c was 5.7 and insulin 20.1.  She is on metformin twice daily with no side effects noted.  2. Vitamin D deficiency Patient denies nausea, vomiting, or muscle weakness but notes fatigue.  Her last vitamin D level was 32.6.  Assessment/Plan:   1. Prediabetes We will refill metformin 500 mg twice daily for 1 month.  - metFORMIN (GLUCOPHAGE) 500 MG tablet; Take 1 tablet (500 mg total) by mouth 2 (two) times daily with a meal.  Dispense: 60 tablet; Refill: 0  2. Vitamin D deficiency We will refill prescription vitamin D once weekly for 1 month.  - Vitamin D, Ergocalciferol, (DRISDOL) 1.25 MG (50000 UNIT) CAPS capsule; Take 1 capsule (50,000 Units total) by mouth every 7 (seven) days.  Dispense: 4 capsule; Refill: 0  3. BMI 35.0-35.9,adult  4. Obesity with starting BMI of 36.8 Shahla is currently in the action stage of change. As such, her goal is to continue with weight loss efforts. She has agreed to the Category 3 Plan.    Exercise goals: All adults should avoid inactivity. Some physical activity is better than none, and adults who participate in any amount of physical activity gain some health benefits.  Behavioral modification strategies: increasing lean protein intake, meal planning and cooking strategies, keeping healthy foods in the home, and planning for success.  Tierza has agreed to follow-up with our clinic in 4 weeks. She was informed of the importance of frequent follow-up visits to maximize her success with intensive lifestyle modifications for her multiple health conditions.   Objective:   Blood pressure 104/64, pulse 82, temperature 97.7 F (36.5 C), height 5\' 4"  (1.626 m), weight 204 lb (92.5 kg), last menstrual period 11/23/2022, currently breastfeeding. Body mass index is 35.02 kg/m.  General: Cooperative, alert, well developed, in no acute distress. HEENT: Conjunctivae and lids unremarkable. Cardiovascular: Regular rhythm.  Lungs: Normal work of breathing. Neurologic: No focal deficits.   Lab Results  Component Value Date   CREATININE 0.71 09/15/2022   BUN 9 09/15/2022   NA 139 09/15/2022   K 4.3 09/15/2022   CL 102 09/15/2022   CO2 22 09/15/2022   Lab Results  Component Value Date   ALT 15 09/15/2022   AST 11 09/15/2022   ALKPHOS 61 09/15/2022   BILITOT 0.2 09/15/2022   Lab Results  Component Value Date   HGBA1C 5.7 (H) 09/15/2022   HGBA1C 6.3 01/06/2022   HGBA1C 5.8 06/12/2020   HGBA1C 5.4 05/23/2019   HGBA1C 5.5 10/20/2018  Lab Results  Component Value Date   INSULIN 20.1 09/15/2022   Lab Results  Component Value Date   TSH 2.400 09/15/2022   Lab Results  Component Value Date   CHOL 191 09/15/2022   HDL 45 09/15/2022   LDLCALC 123 (H) 09/15/2022   LDLDIRECT 150.0 02/13/2015   TRIG 127 09/15/2022   CHOLHDL 4 06/12/2020   Lab Results  Component Value Date   VD25OH 32.6 09/15/2022   Lab Results  Component Value Date   WBC 7.7 09/15/2022   HGB  12.9 09/15/2022   HCT 39.2 09/15/2022   MCV 87 09/15/2022   PLT 334 09/15/2022   Lab Results  Component Value Date   IRON 54 06/12/2020   FERRITIN 12.1 06/12/2020   Attestation Statements:   Reviewed by clinician on day of visit: allergies, medications, problem list, medical history, surgical history, family history, social history, and previous encounter notes.   I, Burt Knack, am acting as transcriptionist for Reuben Likes, MD.  I have reviewed the above documentation for accuracy and completeness, and I agree with the above. - Reuben Likes, MD

## 2022-12-19 DIAGNOSIS — F330 Major depressive disorder, recurrent, mild: Secondary | ICD-10-CM | POA: Diagnosis not present

## 2023-01-04 ENCOUNTER — Encounter: Payer: Self-pay | Admitting: Internal Medicine

## 2023-01-07 NOTE — Progress Notes (Signed)
MyChart Video Visit    Virtual Visit via Video Note   This format is felt to be most appropriate for this patient at this time. Physical exam was limited by quality of the video and audio technology used for the visit. CMA was able to get the patient set up on a video visit.  Patient location: Home. Patient and provider in visit Provider location: Office  I discussed the limitations of evaluation and management by telemedicine and the availability of in person appointments. The patient expressed understanding and agreed to proceed.  Visit Date: 01/08/2023  Today's healthcare provider: Dulce Sellar, NP     Subjective:   Patient ID: Sheri [name], female    DOB: [birth year], 32 y.o.   MRN: [id number]  Chief Complaint  Patient presents with   Cough    Pt c/o cough, present for a week. Has tried mucinex, humidifier and tea with honey which did help slightly    HPI Persistent cough:  pt has virus induced asthma, having a persistent non-productive cough with mild SOB & chest tightness. She is feeling sore in her chest and back from so much coughing. She denies fever or any sinus sx.  She has been using her rescue inhaler which helps some. She also has tried promethazine cough syrup and Mucinex, humidification and hot tea w/honey.  Assessment & Plan:  Acute bronchospasm due to viral infection - sending prednisone and cough syrup, advised on use & SE of both, increase water intake, can take generic Aleve, 1-2 tabs bid for pleuritic or back pain, body aches, fever. RTO precautions provided.  -     predniSONE; Take with breakfast:  6 tab day 1, 5 tab day 2-3, 4 tab day 4, 3 tab day 5, 2 tab day 6  Dispense: 25 tablet; Refill: 0 -     guaiFENesin-Codeine; Take 5 mLs by mouth 3 (three) times daily as needed for cough (May cause drowsiness).  Dispense: 118 mL; Refill: 0   Past Medical History:  Diagnosis Date   Alcohol addiction (HCC)    Anemia    Anxiety    Back pain     Bipolar depression (HCC)    under psych rx    Carpal tunnel syndrome    Chest pain    Depression    Drug addiction in remission [facility])    heroin   Enteritis due to Norovirus 05/18/2016   Heartburn    Hx of pulmonary embolus 02/13/2015   Hyperlipidemia    Hypokalemia 05/17/2016   IUD (intrauterine device) in place 02/13/2015   Joint pain    Mild mitral regurgitation    Mild tricuspid regurgitation    Mitral regurgitation 06/13/2018   Near syncope 05/16/2016   a. felt due to norovirus.   Nonallopathic lesion of cervical region 09/01/2018   Nonallopathic lesion of rib cage 09/01/2018   Obesity    Orthostatic hypotension 05/18/2016   Palpitations    PCOS (polycystic ovarian syndrome) 02/13/2015   Prediabetes    Pulmonary embolism (HCC) 2011   neg heme evaluation  felt to be from ocps    Scapular dyskinesis 09/01/2018   Smoker 12/30/2016   Syncope and collapse 05/16/2016   Thoracic outlet syndrome of left thoracic outlet 02/15/2018   Thoracic radiculopathy due to degenerative joint disease of spine 10/13/2016   CT and XR show at least 3 levels of DDD of thoracic spine  I suspect this is related to both the scoliosis and the history of dance  Past Surgical History:  Procedure Laterality Date   NO PAST SURGERIES      Outpatient Medications Prior to Visit  Medication Sig Dispense Refill   albuterol (PROAIR HFA) 108 (90 Base) MCG/ACT inhaler Inhale 2 puffs into the lungs every 6 (six) hours as needed for wheezing or shortness of breath. 1 each 1   aspirin EC 81 MG tablet Take 81 mg by mouth daily. Swallow whole.     busPIRone (BUSPAR) 15 MG tablet Take 1 tablet (15 mg total) by mouth 2 (two) times daily. 180 tablet 0   cetirizine (ZYRTEC) 10 MG tablet Take 10 mg by mouth daily.     lamoTRIgine (LAMICTAL) 25 MG tablet Take two tablet by mouth twice times daily. 360 tablet 0   metFORMIN (GLUCOPHAGE) 500 MG tablet Take 1 tablet (500 mg total) by mouth 2 (two) times daily with  a meal. 60 tablet 0   paragard intrauterine copper IUD IUD 1 each by Intrauterine route once.     sertraline (ZOLOFT) 50 MG tablet Take 1 tablet (50 mg total) by mouth daily. 90 tablet 0   SUMAtriptan (IMITREX) 25 MG tablet TAKE 1 TABLET BY MOUTH AT START OF MIGRAINE. 10 tablet 1   tiZANidine (ZANAFLEX) 4 MG tablet TAKE 1 TABLET BY MOUTH AT BEDTIME. 90 tablet 1   Vitamin D, Ergocalciferol, (DRISDOL) 1.25 MG (50000 UNIT) CAPS capsule Take 1 capsule (50,000 Units total) by mouth every 7 (seven) days. 4 capsule 0   No facility-administered medications prior to visit.    Allergies  Allergen Reactions   Bupropion Itching and Other (See Comments)       Objective:   Physical Exam Vitals and nursing note reviewed.  Constitutional:      General: Pt is not in acute distress.    Appearance: Normal appearance.  HENT:     Head: Normocephalic.  Pulmonary:     Effort: No respiratory distress.  Musculoskeletal:     Cervical back: Normal range of motion.  Skin:    General: Skin is dry.     Coloration: Skin is not pale.  Neurological:     Mental Status: Pt is alert and oriented to person, place, and time.  Psychiatric:        Mood and Affect: Mood normal.   Ht 5\' 4"  (1.626 m)   LMP 12/20/2022 (Approximate)   Breastfeeding No   BMI 35.02 kg/m   Wt Readings from Last 3 Encounters:  12/08/22 204 lb (92.5 kg)  12/01/22 212 lb (96.2 kg)  11/27/22 212 lb (96.2 kg)        I discussed the assessment and treatment plan with the patient. The patient was provided an opportunity to ask questions and all were answered. The patient agreed with the plan and demonstrated an understanding of the instructions.   The patient was advised to call back or seek an in-person evaluation if the symptoms worsen or if the condition fails to improve as anticipated.  Dulce Sellar, NP [facility] [place] [phone] (phone) [phone] (fax)  [facility]

## 2023-01-07 NOTE — Progress Notes (Signed)
[name] [address] [place] [address] Phone: [phone] Subjective:   Sheri [name], am serving as a scribe for Dr. Antoine Primas.  I'm seeing this patient by the request  of:  Panosh, Neta Mends, MD  CC: Low back pain follow-up  [id number]  12/01/2022 Seems the patient did have more of a muscle spasm noted.  Toradol and Depo-Medrol given today.  Did not see any asymmetry that would make me think otherwise.  We discussed with patient about x-rays to further evaluate for any bony abnormality such as rib fracture.  Patient wanted to hold on this at the moment.  Follow-up again in 6 to 8 weeks otherwise.        Update 01/12/2023 Sheri [name] is a 32 y.o. female coming in with complaint of T spine pain. Patient states here for manipulation. Has had a cough the last couple of days and is on prednisone and z pack. Patient is still having significant coughing at the moment.  Feels like that is aggravating more of her thoracic spine.  Describes it as a dull, throbbing aching sensation.     Past Medical History:  Diagnosis Date   Alcohol addiction (HCC)    Anemia    Anxiety    Back pain    Bipolar depression (HCC)    under psych rx    Carpal tunnel syndrome    Chest pain    Depression    Drug addiction in remission [facility])    heroin   Enteritis due to Norovirus 05/18/2016   Heartburn    Hx of pulmonary embolus 02/13/2015   Hyperlipidemia    Hypokalemia 05/17/2016   IUD (intrauterine device) in place 02/13/2015   Joint pain    Mild mitral regurgitation    Mild tricuspid regurgitation    Mitral regurgitation 06/13/2018   Near syncope 05/16/2016   a. felt due to norovirus.   Nonallopathic lesion of cervical region 09/01/2018   Nonallopathic lesion of rib cage 09/01/2018   Obesity    Orthostatic hypotension 05/18/2016   Palpitations    PCOS (polycystic ovarian syndrome) 02/13/2015   Prediabetes    Pulmonary embolism (HCC)  2011   neg heme evaluation  felt to be from ocps    Scapular dyskinesis 09/01/2018   Smoker 12/30/2016   Syncope and collapse 05/16/2016   Thoracic outlet syndrome of left thoracic outlet 02/15/2018   Thoracic radiculopathy due to degenerative joint disease of spine 10/13/2016   CT and XR show at least 3 levels of DDD of thoracic spine  I suspect this is related to both the scoliosis and the history of dance   Past Surgical History:  Procedure Laterality Date   NO PAST SURGERIES     Social History   Socioeconomic History   Marital status: Married    Spouse name: Not on file   Number of children: Not on file   Years of education: Not on file   Highest education level: Bachelor's degree (e.g., BA, AB, BS)  Occupational History   Occupation: nanny   Occupation: Producer, television/film/video in Marketing & Outreach  Tobacco Use   Smoking status: Former    Current packs/day: 0.00    Types: Cigarettes    Quit date: 12/20/2015    Years since quitting: 7.0   Smokeless tobacco: Never  Vaping Use   Vaping status: Never Used  Substance and Sexual Activity   Alcohol use: No    Alcohol/week: 0.0 standard drinks  of alcohol   Drug use: No   Sexual activity: Not on file    Comment: previous IUD  Other Topics Concern   Not on file  Social History Narrative   5-10 hours of sleep per night   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)   Recovering from drug and alcohol addiction   Joined [employer]   Lives with her parents   2 dogs in the home      [place] 3 years  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       Right Handed    Lives in a one story home    Social Determinants of Health   Financial Resource Strain: Medium Risk (01/05/2022)   Overall Financial Resource Strain (CARDIA)    Difficulty of Paying Living Expenses: Somewhat hard  Food Insecurity: Food Insecurity Present (01/05/2022)   Hunger Vital Sign    Worried About Running Out of Food in the Last Year: Sometimes true    Ran Out of Food in the  Last Year: Never true  Transportation Needs: No Transportation Needs (01/05/2022)   PRAPARE - Administrator, Civil Service (Medical): No    Lack of Transportation (Non-Medical): No  Physical Activity: Insufficiently Active (01/05/2022)   Exercise Vital Sign    Days of Exercise per Week: 2 days    Minutes of Exercise per Session: 30 min  Stress: Stress Concern Present (01/05/2022)   [employer] of Occupational Health - Occupational Stress Questionnaire    Feeling of Stress : To some extent  Social Connections: Moderately Integrated (01/05/2022)   Social Connection and Isolation Panel [NHANES]    Frequency of Communication with Friends and Family: More than three times a week    Frequency of Social Gatherings with Friends and Family: Three times a week    Attends Religious Services: Never    Active Member of Clubs or Organizations: Yes    Attends Engineer, structural: More than 4 times per year    Marital Status: Married   Allergies  Allergen Reactions   Bupropion Itching and Other (See Comments)   Family History  Problem Relation Age of Onset   Hyperlipidemia Mother    Mental illness Mother    Depression Mother    Anxiety disorder Mother    Bipolar disorder Mother    Anxiety disorder Father    Depression Father    Hypertension Father    Irritable bowel syndrome Father    Sleep apnea Father    Obesity Father    Colon cancer Maternal Grandfather     Current Outpatient Medications (Endocrine & Metabolic):    metFORMIN (GLUCOPHAGE) 500 MG tablet, Take 1 tablet (500 mg total) by mouth 2 (two) times daily with a meal.   paragard intrauterine copper IUD IUD, 1 each by Intrauterine route once.   predniSONE (DELTASONE) 10 MG tablet, Take with breakfast:  6 tab day 1, 5 tab day 2-3, 4 tab day 4, 3 tab day 5, 2 tab day 6   Current Outpatient Medications (Respiratory):    beclomethasone (QVAR REDIHALER) 80 MCG/ACT inhaler, Inhale 2 puffs into the lungs  daily.   albuterol (PROAIR HFA) 108 (90 Base) MCG/ACT inhaler, Inhale 2 puffs into the lungs every 6 (six) hours as needed for wheezing or shortness of breath.   cetirizine (ZYRTEC) 10 MG tablet, Take 10 mg by mouth daily.   guaiFENesin-codeine (ROBITUSSIN AC) 100-10 MG/5ML syrup, Take 5 mLs by mouth 3 (three) times daily as  needed for cough (May cause drowsiness).  Current Outpatient Medications (Analgesics):    aspirin EC 81 MG tablet, Take 81 mg by mouth daily. Swallow whole.   SUMAtriptan (IMITREX) 25 MG tablet, TAKE 1 TABLET BY MOUTH AT START OF MIGRAINE.   Current Outpatient Medications (Other):    busPIRone (BUSPAR) 15 MG tablet, Take 1 tablet (15 mg total) by mouth 2 (two) times daily.   lamoTRIgine (LAMICTAL) 25 MG tablet, Take two tablet by mouth twice times daily.   sertraline (ZOLOFT) 50 MG tablet, Take 1 tablet (50 mg total) by mouth daily.   tiZANidine (ZANAFLEX) 4 MG tablet, TAKE 1 TABLET BY MOUTH AT BEDTIME.   Vitamin D, Ergocalciferol, (DRISDOL) 1.25 MG (50000 UNIT) CAPS capsule, Take 1 capsule (50,000 Units total) by mouth every 7 (seven) days.   Reviewed prior external information including notes and imaging from  primary care provider As well as notes that were available from care everywhere and other healthcare systems.  Past medical history, social, surgical and family history all reviewed in electronic medical record.  No pertanent information unless stated regarding to the chief complaint.   Review of Systems:  No headache, visual changes, nausea, vomiting, diarrhea, constipation, dizziness, abdominal pain, skin rash, fevers, chills, night sweats, weight loss, swollen lymph nodes, body aches, joint swelling, chest pain, shortness of breath, mood changes. POSITIVE muscle aches  Objective  Blood pressure 118/82, pulse 86, height 5\' 4"  (1.626 m), weight 207 lb (93.9 kg), last menstrual period 12/20/2022, SpO2 96%.   General: No apparent distress alert and oriented  x3 mood and affect normal, dressed appropriately.  HEENT: Pupils equal, extraocular movements intact  Respiratory: Patient's speak in full sentences and does not appear short of breath  Cardiovascular: No lower extremity edema, non tender, no erythema  Tender to palpation of the paraspinal musculature.  Patient does have some tightness noted in the thoracic spine.  Patient does have a cough noted on today.  Still though it does not appear short of breath.  Osteopathic findings C2 flexed rotated and side bent right C4 flexed rotated and side bent left C6 flexed rotated and side bent left T3 extended rotated and side bent right inhaled third rib T9 extended rotated and side bent left T11 extended rotated and side bent right L2 flexed rotated and side bent right Sacrum right on right     Impression and Recommendations:      The above documentation has been reviewed and is accurate and complete Judi Saa, DO

## 2023-01-08 ENCOUNTER — Telehealth (INDEPENDENT_AMBULATORY_CARE_PROVIDER_SITE_OTHER): Payer: BC Managed Care – PPO | Admitting: Family

## 2023-01-08 ENCOUNTER — Encounter: Payer: Self-pay | Admitting: Family

## 2023-01-08 VITALS — Ht 64.0 in

## 2023-01-08 DIAGNOSIS — J9801 Acute bronchospasm: Secondary | ICD-10-CM

## 2023-01-08 DIAGNOSIS — B349 Viral infection, unspecified: Secondary | ICD-10-CM | POA: Diagnosis not present

## 2023-01-08 MED ORDER — GUAIFENESIN-CODEINE 100-10 MG/5ML PO SYRP
5.0000 mL | ORAL_SOLUTION | Freq: Three times a day (TID) | ORAL | 0 refills | Status: DC | PRN
Start: 2023-01-08 — End: 2023-02-01

## 2023-01-08 MED ORDER — PREDNISONE 10 MG PO TABS: ORAL_TABLET | ORAL | 0 refills | Status: DC

## 2023-01-12 ENCOUNTER — Ambulatory Visit: Payer: BC Managed Care – PPO | Admitting: Family Medicine

## 2023-01-12 ENCOUNTER — Encounter: Payer: Self-pay | Admitting: Family Medicine

## 2023-01-12 VITALS — BP 118/82 | HR 86 | Ht 64.0 in | Wt 207.0 lb

## 2023-01-12 DIAGNOSIS — M4724 Other spondylosis with radiculopathy, thoracic region: Secondary | ICD-10-CM | POA: Diagnosis not present

## 2023-01-12 DIAGNOSIS — M9908 Segmental and somatic dysfunction of rib cage: Secondary | ICD-10-CM | POA: Diagnosis not present

## 2023-01-12 DIAGNOSIS — M9901 Segmental and somatic dysfunction of cervical region: Secondary | ICD-10-CM | POA: Diagnosis not present

## 2023-01-12 DIAGNOSIS — M9902 Segmental and somatic dysfunction of thoracic region: Secondary | ICD-10-CM | POA: Diagnosis not present

## 2023-01-12 DIAGNOSIS — M9903 Segmental and somatic dysfunction of lumbar region: Secondary | ICD-10-CM | POA: Diagnosis not present

## 2023-01-12 DIAGNOSIS — M9904 Segmental and somatic dysfunction of sacral region: Secondary | ICD-10-CM | POA: Diagnosis not present

## 2023-01-12 MED ORDER — QVAR REDIHALER 80 MCG/ACT IN AERB: 2.0000 | INHALATION_SPRAY | Freq: Every day | RESPIRATORY_TRACT | 0 refills | Status: DC

## 2023-01-12 NOTE — Patient Instructions (Signed)
Qvar inhaler 2 puffs daily See you again in 4-6 weeks

## 2023-01-12 NOTE — Assessment & Plan Note (Addendum)
Patient has had degenerative disc disease as well as some with the cough I think have him increase his muscle strain.  Possible diaphragmatic irritation and Qvar given.  Discussed icing regimen and home exercises.  Follow-up again in 6 to 8 weeks Qvar given as well today secondary to the inflammation likely noted.

## 2023-01-13 ENCOUNTER — Ambulatory Visit (INDEPENDENT_AMBULATORY_CARE_PROVIDER_SITE_OTHER): Payer: 59 | Admitting: Family Medicine

## 2023-01-28 ENCOUNTER — Other Ambulatory Visit (HOSPITAL_COMMUNITY): Payer: Self-pay | Admitting: Psychiatry

## 2023-01-28 DIAGNOSIS — F39 Unspecified mood [affective] disorder: Secondary | ICD-10-CM

## 2023-01-28 DIAGNOSIS — F419 Anxiety disorder, unspecified: Secondary | ICD-10-CM

## 2023-01-30 ENCOUNTER — Other Ambulatory Visit (HOSPITAL_COMMUNITY): Payer: Self-pay | Admitting: Psychiatry

## 2023-01-30 DIAGNOSIS — F39 Unspecified mood [affective] disorder: Secondary | ICD-10-CM

## 2023-01-30 DIAGNOSIS — F419 Anxiety disorder, unspecified: Secondary | ICD-10-CM

## 2023-02-01 ENCOUNTER — Encounter (INDEPENDENT_AMBULATORY_CARE_PROVIDER_SITE_OTHER): Payer: Self-pay | Admitting: Family Medicine

## 2023-02-01 ENCOUNTER — Ambulatory Visit (INDEPENDENT_AMBULATORY_CARE_PROVIDER_SITE_OTHER): Payer: BC Managed Care – PPO | Admitting: Family Medicine

## 2023-02-01 VITALS — BP 116/77 | HR 96 | Temp 97.8°F | Ht 64.0 in | Wt 202.0 lb

## 2023-02-01 DIAGNOSIS — E669 Obesity, unspecified: Secondary | ICD-10-CM | POA: Diagnosis not present

## 2023-02-01 DIAGNOSIS — Z6834 Body mass index (BMI) 34.0-34.9, adult: Secondary | ICD-10-CM | POA: Diagnosis not present

## 2023-02-01 DIAGNOSIS — R7303 Prediabetes: Secondary | ICD-10-CM

## 2023-02-01 DIAGNOSIS — E66812 Obesity, class 2: Secondary | ICD-10-CM

## 2023-02-01 DIAGNOSIS — E559 Vitamin D deficiency, unspecified: Secondary | ICD-10-CM | POA: Diagnosis not present

## 2023-02-01 MED ORDER — METFORMIN HCL 500 MG PO TABS
500.0000 mg | ORAL_TABLET | Freq: Two times a day (BID) | ORAL | 0 refills | Status: DC
Start: 2023-02-01 — End: 2023-02-23

## 2023-02-01 MED ORDER — VITAMIN D (ERGOCALCIFEROL) 1.25 MG (50000 UNIT) PO CAPS
50000.0000 [IU] | ORAL_CAPSULE | ORAL | 0 refills | Status: DC
Start: 2023-02-01 — End: 2023-02-23

## 2023-02-01 NOTE — Progress Notes (Unsigned)
Chief Complaint:   OBESITY Sheri [name] is here to discuss her progress with her obesity treatment plan along with follow-up of her obesity related diagnoses. Sheri [name] is on the Category 3 Plan and states she is following her eating plan approximately 50% of the time. [facility]ri [name]    Today's visit was #: 7 Starting weight: 214 lbs Starting date: 09/15/2022 Today's weight: 202 lbs Today's date: 02/01/2023 Total lbs lost to date: 12 Total lbs lost since last in-office visit: 2  Interim History: Patient had an URI and was feeling poorly for a few weeks.  It has been a week and a half since she has started feeling better.  She is trying to get back on track since she has been feeling better.  She has been eating more cottage cheese than she did previously.  She is trying to do more hummus and vegetables.  She is over eating deli meat.  She gets distracted by fast food when she isn't at home.   Subjective:   1. Vitamin D deficiency Patient is on prescription vitamin D, and she notes fatigue.  2. Prediabetes Patient's last A1c was 5.7.  She is on metformin with only occasional BM with increased sugar intake.  Assessment/Plan:   1. Vitamin D deficiency We will refill prescription vitamin D 50,000 IU once weekly for 1 month.  - Vitamin D, Ergocalciferol, (DRISDOL) 1.25 MG (50000 UNIT) CAPS capsule; Take 1 capsule (50,000 Units total) by mouth every 7 (seven) days.  Dispense: 4 capsule; Refill: 0  2. Prediabetes We will refill metformin 500 mg twice daily for 1 month.  - metFORMIN (GLUCOPHAGE) 500 MG tablet; Take 1 tablet (500 mg total) by mouth 2 (two) times daily with a meal.  Dispense: 60 tablet; Refill: 0  3. BMI 34.0-34.9,adult  4. Obesity with starting BMI of 36.8 Sheri [name] is currently in the action stage of change. As such, her goal is to continue with weight loss efforts. She has agreed to the Category 3 Plan.    Exercise goals: All adults should avoid inactivity. Some physical activity is better than none, and adults who participate in any amount of physical activity gain some health benefits.  Behavioral modification strategies: increasing lean protein intake, meal planning and cooking strategies, keeping healthy foods in the home, and planning for success.  Sheri [name] has agreed to follow-up with our clinic in 3 weeks. She was informed of the importance of frequent follow-up visits to maximize her success with intensive lifestyle modifications for her multiple health conditions.   Objective:   Blood pressure 116/77, pulse 96, temperature 97.8 F (36.6 C), height 5\' 4"  (1.626 m), weight 202 lb (91.6 kg), last menstrual period 01/19/2023, SpO2 98%. Body mass index is 34.67 kg/m.  General: Cooperative, alert, well developed, in no acute distress. HEENT: Conjunctivae and lids unremarkable. Cardiovascular: Regular rhythm.  Lungs: Normal work of breathing. Neurologic: No focal deficits.   Lab Results  Component Value Date   CREATININE 0.71 09/15/2022   BUN 9 09/15/2022   NA 139 09/15/2022   K 4.3 09/15/2022   CL 102 09/15/2022   CO2 22 09/15/2022   Lab Results  Component Value Date   ALT 15 09/15/2022   AST 11 09/15/2022   ALKPHOS 61 09/15/2022   BILITOT 0.2 09/15/2022   Lab Results  Component Value Date   HGBA1C 5.7 (H) 09/15/2022   HGBA1C 6.3 01/06/2022   HGBA1C  5.8 06/12/2020   HGBA1C 5.4 05/23/2019   HGBA1C 5.5 10/20/2018   Lab Results  Component Value Date   INSULIN 20.1 09/15/2022   Lab Results  Component Value Date   TSH 2.400 09/15/2022   Lab Results  Component Value Date   CHOL 191 09/15/2022   HDL 45 09/15/2022   LDLCALC 123 (H) 09/15/2022   LDLDIRECT 150.0 02/13/2015   TRIG 127 09/15/2022   CHOLHDL 4 06/12/2020   Lab Results  Component Value Date   VD25OH 32.6 09/15/2022   Lab Results  Component Value Date   WBC 7.7 09/15/2022   HGB 12.9 09/15/2022    HCT 39.2 09/15/2022   MCV 87 09/15/2022   PLT 334 09/15/2022   Lab Results  Component Value Date   IRON 54 06/12/2020   FERRITIN 12.1 06/12/2020   Attestation Statements:   Reviewed by clinician on day of visit: allergies, medications, problem list, medical history, surgical history, family history, social history, and previous encounter notes.   I, Burt Knack, am acting as transcriptionist for Reuben Likes, MD.  I have reviewed the above documentation for accuracy and completeness, and I agree with the above. - Reuben Likes, MD

## 2023-02-07 ENCOUNTER — Other Ambulatory Visit (HOSPITAL_COMMUNITY): Payer: Self-pay | Admitting: Psychiatry

## 2023-02-07 DIAGNOSIS — F419 Anxiety disorder, unspecified: Secondary | ICD-10-CM

## 2023-02-09 NOTE — Progress Notes (Unsigned)
[name] [address] [place] [address] Phone: [phone] Subjective:   [name], am serving as a scribe for Dr. Antoine Primas.  I'm seeing this patient by the request  of:  Panosh, Neta Mends, MD  CC: Back and neck pain follow-up  [name]  [name] is a 32 y.o. female coming in with complaint of back and neck pain. OMT 01/12/2023. Patient states same per usual. No new concerns.  Medications patient has been prescribed: None  Taking:         Reviewed prior external information including notes and imaging from previsou exam, outside providers and external EMR if available.   As well as notes that were available from care everywhere and other healthcare systems.  Past medical history, social, surgical and family history all reviewed in electronic medical record.  No pertanent information unless stated regarding to the chief complaint.   Past Medical History:  Diagnosis Date   Alcohol addiction (HCC)    Anemia    Anxiety    Back pain    Bipolar depression (HCC)    under psych rx    Carpal tunnel syndrome    Chest pain    Depression    Drug addiction in remission [facility])    heroin   Enteritis due to Norovirus 05/18/2016   Heartburn    Hx of pulmonary embolus 02/13/2015   Hyperlipidemia    Hypokalemia 05/17/2016   IUD (intrauterine device) in place 02/13/2015   Joint pain    Mild mitral regurgitation    Mild tricuspid regurgitation    Mitral regurgitation 06/13/2018   Near syncope 05/16/2016   a. felt due to norovirus.   Nonallopathic lesion of cervical region 09/01/2018   Nonallopathic lesion of rib cage 09/01/2018   Obesity    Orthostatic hypotension 05/18/2016   Palpitations    PCOS (polycystic ovarian syndrome) 02/13/2015   Prediabetes    Pulmonary embolism (HCC) 2011   neg heme evaluation  felt to be from ocps    Scapular dyskinesis 09/01/2018   Smoker 12/30/2016   Syncope and collapse  05/16/2016   Thoracic outlet syndrome of left thoracic outlet 02/15/2018   Thoracic radiculopathy due to degenerative joint disease of spine 10/13/2016   CT and XR show at least 3 levels of DDD of thoracic spine  I suspect this is related to both the scoliosis and the history of dance    Allergies  Allergen Reactions   Bupropion Itching and Other (See Comments)     Review of Systems:  No headache, visual changes, nausea, vomiting, diarrhea, constipation, dizziness, abdominal pain, skin rash, fevers, chills, night sweats, weight loss, swollen lymph nodes, body aches, joint swelling, chest pain, shortness of breath, mood changes. POSITIVE muscle aches  Objective  Blood pressure 116/80, pulse 87, height 5\' 4"  (1.626 m), weight 209 lb (94.8 kg), last menstrual period 01/19/2023, SpO2 98%.   General: No apparent distress alert and oriented x3 mood and affect normal, dressed appropriately.  HEENT: Pupils equal, extraocular movements intact  Respiratory: Patient's speak in full sentences and does not appear short of breath  Cardiovascular: No lower extremity edema, non tender, no erythema  Low back exam shows patient does have some loss lordosis noted.  Tightness with FABER left greater than right.  Osteopathic findings  C2 flexed rotated and side bent right C6 flexed rotated and side bent left T5 extended rotated and side bent right inhaled rib T6 extended rotated and side  bent left L2 flexed rotated and side bent right Sacrum right on right    Assessment and Plan:  Scapular dyskinesis Intermittent exacerbation.  Chronic problem with exacerbation.  Discussed icing regimen of home exercises, increase activity slowly.  Discussed which activities to do and which ones to avoid.  Increase activity slowly.  Patient will continue to work on weight loss posture and ergonomics.  Refilled meloxicam for exacerbations.  Follow-up again 6 to 8 weeks    Nonallopathic problems  Decision today to  treat with OMT was based on Physical Exam  After verbal consent patient was treated with HVLA, ME, FPR techniques in cervical, rib, thoracic, lumbar, and sacral  areas  Patient tolerated the procedure well with improvement in symptoms  Patient given exercises, stretches and lifestyle modifications  See medications in patient instructions if given  Patient will follow up in 4-8 weeks     The above documentation has been reviewed and is accurate and complete Judi Saa, DO         Note: This dictation was prepared with Dragon dictation along with smaller phrase technology. Any transcriptional errors that result from this process are unintentional.

## 2023-02-11 ENCOUNTER — Ambulatory Visit: Payer: BC Managed Care – PPO | Admitting: Family Medicine

## 2023-02-11 ENCOUNTER — Encounter: Payer: Self-pay | Admitting: Family Medicine

## 2023-02-11 VITALS — BP 116/80 | HR 87 | Ht 64.0 in | Wt 209.0 lb

## 2023-02-11 DIAGNOSIS — M9904 Segmental and somatic dysfunction of sacral region: Secondary | ICD-10-CM | POA: Diagnosis not present

## 2023-02-11 DIAGNOSIS — M9908 Segmental and somatic dysfunction of rib cage: Secondary | ICD-10-CM | POA: Diagnosis not present

## 2023-02-11 DIAGNOSIS — M9901 Segmental and somatic dysfunction of cervical region: Secondary | ICD-10-CM

## 2023-02-11 DIAGNOSIS — M9902 Segmental and somatic dysfunction of thoracic region: Secondary | ICD-10-CM

## 2023-02-11 DIAGNOSIS — M9903 Segmental and somatic dysfunction of lumbar region: Secondary | ICD-10-CM

## 2023-02-11 DIAGNOSIS — G2589 Other specified extrapyramidal and movement disorders: Secondary | ICD-10-CM

## 2023-02-11 MED ORDER — MELOXICAM 15 MG PO TABS: 15.0000 mg | ORAL_TABLET | Freq: Every day | ORAL | 0 refills | Status: DC

## 2023-02-11 NOTE — Assessment & Plan Note (Signed)
Intermittent exacerbation.  Chronic problem with exacerbation.  Discussed icing regimen of home exercises, increase activity slowly.  Discussed which activities to do and which ones to avoid.  Increase activity slowly.  Patient will continue to work on weight loss posture and ergonomics.  Refilled meloxicam for exacerbations.  Follow-up again 6 to 8 weeks

## 2023-02-11 NOTE — Patient Instructions (Signed)
Meloxicam refilled Keep crushin it See you again in 7-8 weeks

## 2023-02-12 ENCOUNTER — Telehealth (HOSPITAL_COMMUNITY): Payer: 59 | Admitting: Psychiatry

## 2023-02-12 ENCOUNTER — Encounter (HOSPITAL_COMMUNITY): Payer: Self-pay | Admitting: Psychiatry

## 2023-02-12 ENCOUNTER — Telehealth (HOSPITAL_COMMUNITY): Payer: BC Managed Care – PPO | Admitting: Psychiatry

## 2023-02-12 VITALS — Wt 209.0 lb

## 2023-02-12 DIAGNOSIS — F419 Anxiety disorder, unspecified: Secondary | ICD-10-CM | POA: Diagnosis not present

## 2023-02-12 DIAGNOSIS — F39 Unspecified mood [affective] disorder: Secondary | ICD-10-CM | POA: Diagnosis not present

## 2023-02-12 MED ORDER — BUSPIRONE HCL 15 MG PO TABS
15.0000 mg | ORAL_TABLET | Freq: Two times a day (BID) | ORAL | 0 refills | Status: DC
Start: 2023-02-12 — End: 2023-05-13

## 2023-02-12 MED ORDER — SERTRALINE HCL 50 MG PO TABS
50.0000 mg | ORAL_TABLET | Freq: Every day | ORAL | 0 refills | Status: DC
Start: 2023-02-12 — End: 2023-05-13

## 2023-02-12 MED ORDER — LAMOTRIGINE 100 MG PO TABS: 100.0000 mg | ORAL_TABLET | Freq: Every day | ORAL | 0 refills | Status: DC

## 2023-02-12 NOTE — Progress Notes (Signed)
[facility] Virtual Progress Note   Patient Location: In Car Provider Location: Home Office  I connect with patient by video and verified that I am speaking with correct person by using two identifiers. I discussed the limitations of evaluation and management by telemedicine and the availability of in person appointments. I also discussed with the patient that there may be a patient responsible charge related to this service. The patient expressed understanding and agreed to proceed.  Sheri [name] [id number] 32 y.o.  02/12/2023 8:28 AM  History of Present Illness:  Patient is evaluated by video session.  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  She liked the new job.  She reported things are going okay and denies any irritability, anger, mania, psychosis open crying spells.  She still have dry eyes and dry mouth but now taking eyedrops which helps.  She has no rash, itching, tremors or shakes.  She is trying to lose weight and seen provider at weight loss program.  Her energy level is good.  Patient lives with her 2 year old stepson and 31-year-old.  Patient had a good support from her husband.  She wants to keep the current medicine which is helping her symptoms.  Past Psychiatric History: H/O depression, anxiety, Bipolar, post partum and ADHD. On medication since age 67.  H/O addiction with opiates, Benzo and Adderall. Received therapy and medications at [facility] counseling and [facility] but not happy with care. Had rehab at [facility] in 2015.  Did well for 5 years without medication until symptoms returns. H/O suicidal thoughts but no attempt. No psychosis.  Tried Celexa caused dizziness, trazodone, weight gain.  Took Lamictal, Klonopin, Lexapro, hydroxyzine, Ambien, Seroquel, Latuda, Hydroxyzine and Wellbutrin.          Outpatient Encounter Medications as of 02/12/2023  Medication Sig    albuterol (PROAIR HFA) 108 (90 Base) MCG/ACT inhaler Inhale 2 puffs into the lungs every 6 (six) hours as needed for wheezing or shortness of breath.   aspirin EC 81 MG tablet Take 81 mg by mouth daily. Swallow whole.   busPIRone (BUSPAR) 15 MG tablet Take 1 tablet (15 mg total) by mouth 2 (two) times daily.   cetirizine (ZYRTEC) 10 MG tablet Take 10 mg by mouth daily.   lamoTRIgine (LAMICTAL) 25 MG tablet Take two tablet by mouth twice times daily.   meloxicam (MOBIC) 15 MG tablet Take 1 tablet (15 mg total) by mouth daily.   metFORMIN (GLUCOPHAGE) 500 MG tablet Take 1 tablet (500 mg total) by mouth 2 (two) times daily with a meal.   paragard intrauterine copper IUD IUD 1 each by Intrauterine route once.   sertraline (ZOLOFT) 50 MG tablet Take 1 tablet (50 mg total) by mouth daily.   Vitamin D, Ergocalciferol, (DRISDOL) 1.25 MG (50000 UNIT) CAPS capsule Take 1 capsule (50,000 Units total) by mouth every 7 (seven) days.   No facility-administered encounter medications on file as of 02/12/2023.    No results found for this or any previous visit (from the past 2160 hour(s)).   Psychiatric Specialty Exam: Physical Exam  Review of Systems  Weight 209 lb (94.8 kg), last menstrual period 01/19/2023.There is no height or weight on file to calculate BMI.  General Appearance: Casual  Eye Contact:  Good  Speech:  Clear and Coherent and Normal Rate  Volume:  Normal  Mood:  Euthymic  Affect:  Congruent  Thought Process:  Goal  Directed  Orientation:  Full (Time, Place, and Person)  Thought Content:  Logical  Suicidal Thoughts:  No  Homicidal Thoughts:  No  Memory:  Immediate;   Good Recent;   Good Remote;   Good  Judgement:  Good  Insight:  Good  Psychomotor Activity:  Normal  Concentration:  Concentration: Good and Attention Span: Good  Recall:  Good  Fund of Knowledge:  Good  Language:  Good  Akathisia:  No  Handed:  Right  AIMS (if indicated):     Assets:  Communication  Skills Desire for Improvement Housing Resilience Social Support Talents/Skills Transportation  ADL's:  Intact  Cognition:  WNL  Sleep:  ok     Assessment/Plan: Mood disorder (HCC) - Plan: lamoTRIgine (LAMICTAL) 100 MG tablet, sertraline (ZOLOFT) 50 MG tablet  Anxiety - Plan: busPIRone (BUSPAR) 15 MG tablet, lamoTRIgine (LAMICTAL) 100 MG tablet, sertraline (ZOLOFT) 50 MG tablet  Patient is stable on current medication.  She has no major panic attack.  She reported dry eye and mouth and now using eyedrops.  Discussed risks and benefits of medication.  She would like to keep the current medication.  She is in therapy with Tresa Endo at [facility] for EMDR.  Continue Lamictal 100 mg daily, Zoloft 50 mg daily and BuSpar 15 mg 2 times a day.  Patient recently had new insurance and like to see if insurance can approved 1 day prescription.  Recommended to call us back if she has any question or any concern.  Follow-up in 3 months   Follow Up Instructions:     I discussed the assessment and treatment plan with the patient. The patient was provided an opportunity to ask questions and all were answered. The patient agreed with the plan and demonstrated an understanding of the instructions.   The patient was advised to call back or seek an in-person evaluation if the symptoms worsen or if the condition fails to improve as anticipated.    Collaboration of Care: Other provider involved in patient's care AEB notes are available in epic to review  Patient/Guardian was advised Release of Information must be obtained prior to any record release in order to collaborate their care with an outside provider. Patient/Guardian was advised if they have not already done so to contact the registration department to sign all necessary forms in order for [name] to release information regarding their care.   Consent: Patient/Guardian gives verbal consent for treatment and assignment of benefits for services provided  during this visit. Patient/Guardian expressed understanding and agreed to proceed.     I provided 20 minutes of non face to face time during this encounter.  Note: This document was prepared by [employer] voice dictation technology and any errors that results from this process are unintentional.    Cleotis Nipper, MD 02/12/2023

## 2023-02-23 ENCOUNTER — Encounter (INDEPENDENT_AMBULATORY_CARE_PROVIDER_SITE_OTHER): Payer: Self-pay | Admitting: Family Medicine

## 2023-02-23 ENCOUNTER — Ambulatory Visit (INDEPENDENT_AMBULATORY_CARE_PROVIDER_SITE_OTHER): Payer: BC Managed Care – PPO | Admitting: Family Medicine

## 2023-02-23 VITALS — BP 124/79 | HR 79 | Temp 98.2°F | Ht 64.0 in | Wt 202.0 lb

## 2023-02-23 DIAGNOSIS — E66812 Obesity, class 2: Secondary | ICD-10-CM

## 2023-02-23 DIAGNOSIS — E559 Vitamin D deficiency, unspecified: Secondary | ICD-10-CM | POA: Diagnosis not present

## 2023-02-23 DIAGNOSIS — R7303 Prediabetes: Secondary | ICD-10-CM

## 2023-02-23 DIAGNOSIS — E669 Obesity, unspecified: Secondary | ICD-10-CM | POA: Diagnosis not present

## 2023-02-23 DIAGNOSIS — F509 Eating disorder, unspecified: Secondary | ICD-10-CM | POA: Insufficient documentation

## 2023-02-23 DIAGNOSIS — F5089 Other specified eating disorder: Secondary | ICD-10-CM

## 2023-02-23 DIAGNOSIS — Z6836 Body mass index (BMI) 36.0-36.9, adult: Secondary | ICD-10-CM

## 2023-02-23 MED ORDER — LOMAIRA 8 MG PO TABS: ORAL_TABLET | ORAL | 0 refills | Status: DC

## 2023-02-23 MED ORDER — TOPIRAMATE 25 MG PO TABS
ORAL_TABLET | ORAL | 0 refills | Status: DC
Start: 2023-02-23 — End: 2023-03-25

## 2023-02-23 MED ORDER — VITAMIN D (ERGOCALCIFEROL) 1.25 MG (50000 UNIT) PO CAPS
50000.0000 [IU] | ORAL_CAPSULE | ORAL | 0 refills | Status: DC
Start: 2023-02-23 — End: 2023-03-25

## 2023-02-23 MED ORDER — METFORMIN HCL 500 MG PO TABS
500.0000 mg | ORAL_TABLET | Freq: Two times a day (BID) | ORAL | 0 refills | Status: DC
Start: 2023-02-23 — End: 2023-03-25

## 2023-02-23 NOTE — Assessment & Plan Note (Signed)
Patient doing well on Metformin.  No GI side effects noted on metformin.  Last A1c of 5.7 and insulin of 20. Needs refill of metformin.

## 2023-02-23 NOTE — Progress Notes (Signed)
[name] [address] [name] [id number] Dept: [phone] Dept Fax: [phone]  SUBJECTIVE:  Chief Complaint: Obesity  Interim History: Patient has been consistently eating closer to the meal plan than she was previously.  She has been getting more protein in daily.  She has slightly altered some of the foods but has the foundation of the nutrition similar. She is going to her aunt's house for Thanksgiving in [place].  She is planning to make a veggie plate for Thanksgiving. No plans prior to Thanksgiving.  She has not been consistent with activity over the last few weeks. No plans so far for the month of December.  May be interested in anti obesity medications.  Sheri [name] is here to discuss her progress with her obesity treatment plan. She is on the Category 3 Plan and states she is following her eating plan approximately 60 to 70 % of the time. She states she is not exercising.   OBJECTIVE: Visit Diagnoses: Problem List Items Addressed This Visit       Other   Prediabetes - Primary    Patient doing well on Metformin.  No GI side effects noted on metformin.  Last A1c of 5.7 and insulin of 20. Needs refill of metformin.      Relevant Medications   metFORMIN (GLUCOPHAGE) 500 MG tablet   Vitamin D deficiency   Relevant Medications   Vitamin D, Ergocalciferol, (DRISDOL) 1.25 MG (50000 UNIT) CAPS capsule   Disorder of eating    Medication options were discussed extensively with patient today.  Given availability, cost, insurance coverage and other medical comorbidities the decision was reached to start medications Lomaira and Topiramate.  These medications will be used as a substitute for brand name Qsymia in doses that are synanomous.  Patient understands this is an off label usage.  We discussed the titration schedule with the goal of 5% weight loss at 3 months at a treatment dose.  The first two  weeks will be a starting dose of 25mg  of Topiramate and 4mg  of Lomaira.  After two weeks the patient will increase to 50mg  of Topiramate and 8mg  of Lomaira and will stay on this dose until the next appointment.  Controlled substance contract was discussed and signed today.  Prescriptions sent in. Patient has a history of substance abuse many years ago.  She has an IUD for contraception.       Relevant Medications   Phentermine HCl (LOMAIRA) 8 MG TABS   topiramate (TOPAMAX) 25 MG tablet    Vitals Temp: 98.2 F (36.8 C) BP: 124/79 Pulse Rate: 79 SpO2: 100 %   Anthropometric Measurements Height: 5\' 4"  (1.626 m) Weight: 202 lb (91.6 kg) BMI (Calculated): 34.66 Weight at Last Visit: 202 lb Weight Lost Since Last Visit: 0 Weight Gained Since Last Visit: 0 Starting Weight: 214 lb Total Weight Loss (lbs): 12 lb (5.443 kg)   Body Composition  Body Fat %: 40.1 % Fat Mass (lbs): 81.2 lbs Muscle Mass (lbs): 115.2 lbs Total Body Water (lbs): 79.4 lbs Visceral Fat Rating : 8   Other Clinical Data Today's Visit #: 8 Starting Date: 09/15/22     ASSESSMENT AND PLAN:  Diet: Areebah is currently in the action stage of change. As such, her goal is to continue with weight loss efforts. She has agreed to Category 3 Plan.  Exercise: Paris has been instructed that some exercise is better than none for weight loss  and overall health benefits.   Behavior Modification:  We discussed the following Behavioral Modification Strategies today: increasing lean protein intake, increasing vegetables, and holiday eating strategies. We discussed various medication options to help Laquasha with her weight loss efforts and we both agreed to start makeshift qsymia.  No follow-ups on file.Marland Kitchen She was informed of the importance of frequent follow up visits to maximize her success with intensive lifestyle modifications for her multiple health conditions.  Attestation Statements:   Reviewed by  clinician on day of visit: allergies, medications, problem list, medical history, surgical history, family history, social history, and previous encounter notes.   Time spent on visit including pre-visit chart review and post-visit care and charting was 40 minutes.    Reuben Likes, MD

## 2023-02-23 NOTE — Assessment & Plan Note (Signed)
Medication options were discussed extensively with patient today.  Given availability, cost, insurance coverage and other medical comorbidities the decision was reached to start medications Lomaira and Topiramate.  These medications will be used as a substitute for brand name Qsymia in doses that are synanomous.  Patient understands this is an off label usage.  We discussed the titration schedule with the goal of 5% weight loss at 3 months at a treatment dose.  The first two weeks will be a starting dose of 25mg  of Topiramate and 4mg  of Lomaira.  After two weeks the patient will increase to 50mg  of Topiramate and 8mg  of Lomaira and will stay on this dose until the next appointment.  Controlled substance contract was discussed and signed today.  Prescriptions sent in. Patient has a history of substance abuse many years ago.  She has an IUD for contraception.

## 2023-03-18 ENCOUNTER — Other Ambulatory Visit (INDEPENDENT_AMBULATORY_CARE_PROVIDER_SITE_OTHER): Payer: Self-pay | Admitting: Family Medicine

## 2023-03-18 DIAGNOSIS — F5089 Other specified eating disorder: Secondary | ICD-10-CM

## 2023-03-22 ENCOUNTER — Encounter: Payer: Self-pay | Admitting: Internal Medicine

## 2023-03-25 ENCOUNTER — Ambulatory Visit (INDEPENDENT_AMBULATORY_CARE_PROVIDER_SITE_OTHER): Payer: BC Managed Care – PPO | Admitting: Family Medicine

## 2023-03-25 VITALS — BP 113/75 | HR 90 | Temp 97.8°F | Ht 64.0 in | Wt 200.0 lb

## 2023-03-25 DIAGNOSIS — E559 Vitamin D deficiency, unspecified: Secondary | ICD-10-CM | POA: Diagnosis not present

## 2023-03-25 DIAGNOSIS — E66812 Obesity, class 2: Secondary | ICD-10-CM

## 2023-03-25 DIAGNOSIS — R7303 Prediabetes: Secondary | ICD-10-CM | POA: Diagnosis not present

## 2023-03-25 DIAGNOSIS — F5089 Other specified eating disorder: Secondary | ICD-10-CM | POA: Diagnosis not present

## 2023-03-25 DIAGNOSIS — Z6834 Body mass index (BMI) 34.0-34.9, adult: Secondary | ICD-10-CM

## 2023-03-25 MED ORDER — LOMAIRA 8 MG PO TABS: 8.0000 mg | ORAL_TABLET | Freq: Every day | ORAL | 0 refills | Status: DC

## 2023-03-25 MED ORDER — TOPIRAMATE 50 MG PO TABS: 50.0000 mg | ORAL_TABLET | Freq: Every day | ORAL | 0 refills | Status: DC

## 2023-03-25 MED ORDER — VITAMIN D (ERGOCALCIFEROL) 1.25 MG (50000 UNIT) PO CAPS: 50000.0000 [IU] | ORAL_CAPSULE | ORAL | 0 refills | Status: DC

## 2023-03-25 MED ORDER — METFORMIN HCL 500 MG PO TABS: 500.0000 mg | ORAL_TABLET | Freq: Two times a day (BID) | ORAL | 0 refills | Status: DC

## 2023-03-25 NOTE — Assessment & Plan Note (Signed)
 Discussed importance of vitamin d supplementation.  Vitamin d supplementation has been shown to decrease fatigue, decrease risk of progression to insulin resistance and then prediabetes, decreases risk of falling in older age and can even assist in decreasing depressive symptoms in PTSD.   Prescription for Vitamin D sent in.

## 2023-03-25 NOTE — Assessment & Plan Note (Signed)
Patient on metformin daily.  No GI issues on medication. Needs labs- will order at next appointment.

## 2023-03-25 NOTE — Progress Notes (Signed)
SUBJECTIVE:  Chief Complaint: Obesity  Interim History: Since last appointment she has been trying to stay more focused and on plan.  Thanksgiving was pretty good- she went to family's house and had a big gathering.   Wednesday is here to discuss her progress with her obesity treatment plan. She is on the Category 3 Plan and states she is following her eating plan approximately 60 % of the time. She states she is doing some walking.   OBJECTIVE: Visit Diagnoses: Problem List Items Addressed This Visit       Other   Prediabetes    Patient on metformin daily.  No GI issues on medication. Needs labs- will order at next appointment.      Relevant Medications   metFORMIN (GLUCOPHAGE) 500 MG tablet   Vitamin D deficiency    Discussed importance of vitamin d supplementation.  Vitamin d supplementation has been shown to decrease fatigue, decrease risk of progression to insulin resistance and then prediabetes, decreases risk of falling in older age and can even assist in decreasing depressive symptoms in PTSD.   Prescription for Vitamin D sent in.        Relevant Medications   Vitamin D, Ergocalciferol, (DRISDOL) 1.25 MG (50000 UNIT) CAPS capsule   Class 2 severe obesity with serious comorbidity and body mass index (BMI) of 36.0 to 36.9 in adult [facility])   Relevant Medications   Phentermine HCl (LOMAIRA) 8 MG TABS   metFORMIN (GLUCOPHAGE) 500 MG tablet   Disorder of eating - Primary    Patient started makeshift qsymia at last appointment.  She is noticing an improvement in control of her intake and food choices. Starting weight of 202 and needs to lose 10lbs by end of February 2025.  PDMP checked and no concerns noticed.  Needs refill of meds today.        Relevant Medications   Phentermine HCl (LOMAIRA) 8 MG TABS   topiramate (TOPAMAX) 50 MG tablet   Other Visit Diagnoses     BMI 34.0-34.9,adult           Vitals Temp: 97.8 F (36.6 C) BP: 113/75 Pulse Rate: 90 SpO2: 100  %   Anthropometric Measurements Height: 5\' 4"  (1.626 m) Weight: 200 lb (90.7 kg) BMI (Calculated): 34.31 Weight at Last Visit: 202 lb Weight Lost Since Last Visit: 2 Weight Gained Since Last Visit: 0 Starting Weight: 214 lb Total Weight Loss (lbs): 14 lb (6.35 kg)   Body Composition  Body Fat %: 40.9 % Fat Mass (lbs): 81.8 lbs Muscle Mass (lbs): 112.2 lbs Total Body Water (lbs): 78.6 lbs Visceral Fat Rating : 8   Other Clinical Data Today's Visit #: 9 Starting Date: 09/15/22     ASSESSMENT AND PLAN:  Diet: Antje is currently in the action stage of change. As such, her goal is to continue with weight loss efforts. She has agreed to Category 3 Plan.  Exercise: Mafalda has been instructed that some exercise is better than none for weight loss and overall health benefits.   Behavior Modification:  We discussed the following Behavioral Modification Strategies today: increasing lean protein intake, increasing vegetables, meal planning and cooking strategies, better snacking choices, and planning for success. We discussed various medication options to help Devra with her weight loss efforts and we both agreed to continue same dose of lomaira and phentermine.  No follow-ups on file.Marland Kitchen She was informed of the importance of frequent follow up visits to maximize her success with intensive lifestyle modifications for  her multiple health conditions.  Attestation Statements:   Reviewed by clinician on day of visit: allergies, medications, problem list, medical history, surgical history, family history, social history, and previous encounter notes.    Rudean Hitt, MD

## 2023-03-25 NOTE — Assessment & Plan Note (Addendum)
Patient started makeshift qsymia at last appointment.  She is noticing an improvement in control of her intake and food choices. Starting weight of 202 and needs to lose 10lbs by end of February 2025.  PDMP checked and no concerns noticed.  Needs refill of meds today.

## 2023-03-30 ENCOUNTER — Telehealth: Payer: BC Managed Care – PPO | Admitting: Internal Medicine

## 2023-04-01 ENCOUNTER — Other Ambulatory Visit (INDEPENDENT_AMBULATORY_CARE_PROVIDER_SITE_OTHER): Payer: Self-pay | Admitting: Family Medicine

## 2023-04-01 ENCOUNTER — Ambulatory Visit: Payer: BC Managed Care – PPO | Admitting: Family Medicine

## 2023-04-01 ENCOUNTER — Ambulatory Visit: Payer: BC Managed Care – PPO | Admitting: Internal Medicine

## 2023-04-01 DIAGNOSIS — E559 Vitamin D deficiency, unspecified: Secondary | ICD-10-CM

## 2023-04-15 ENCOUNTER — Encounter: Payer: Self-pay | Admitting: Internal Medicine

## 2023-04-15 ENCOUNTER — Ambulatory Visit: Payer: BC Managed Care – PPO | Admitting: Internal Medicine

## 2023-04-15 VITALS — BP 90/60 | HR 84 | Temp 97.8°F | Ht 64.0 in | Wt 205.0 lb

## 2023-04-15 DIAGNOSIS — R062 Wheezing: Secondary | ICD-10-CM

## 2023-04-15 DIAGNOSIS — J029 Acute pharyngitis, unspecified: Secondary | ICD-10-CM

## 2023-04-15 DIAGNOSIS — J309 Allergic rhinitis, unspecified: Secondary | ICD-10-CM

## 2023-04-15 DIAGNOSIS — R059 Cough, unspecified: Secondary | ICD-10-CM | POA: Diagnosis not present

## 2023-04-15 DIAGNOSIS — J069 Acute upper respiratory infection, unspecified: Secondary | ICD-10-CM

## 2023-04-15 DIAGNOSIS — Z79899 Other long term (current) drug therapy: Secondary | ICD-10-CM

## 2023-04-15 DIAGNOSIS — R0981 Nasal congestion: Secondary | ICD-10-CM

## 2023-04-15 LAB — POCT INFLUENZA A/B
Influenza A, POC: NEGATIVE
Influenza B, POC: NEGATIVE

## 2023-04-15 LAB — POCT RAPID STREP A (OFFICE): Rapid Strep A Screen: NEGATIVE

## 2023-04-15 LAB — POC COVID19 BINAXNOW: SARS Coronavirus 2 Ag: NEGATIVE

## 2023-04-15 MED ORDER — FLUTICASONE FUROATE-VILANTEROL 100-25 MCG/ACT IN AEPB: 1.0000 | INHALATION_SPRAY | Freq: Every day | RESPIRATORY_TRACT | 3 refills | Status: DC

## 2023-04-15 NOTE — Progress Notes (Signed)
Chief Complaint  Patient presents with   Wheezing    Pt reports wheezing going on for about a month and half. Pt reports sx worsen when it is cold out.    Sore Throat    Pt reports nasal congestion, sore throat, fatigue and a little bit of dry cough, a little headache. Denied fever, bodyache. Sx started on Tuesday. Took NightQuil last night.      HPI: Sheri [name] 32 y.o. come in for wheezing problem   Last visit with -pcp 9 23 but under care  weight manamgent for prediabetes other  [facility] and [facility]eze  Given albuterol  Wheeze with cold  Neg asthma hx of couple years tobacco  .  Today has just a cold  Fall and spring  allergies  and allergic to cats. By sx.  When gets cold getting wheezing  heart it and cough to cath breath using inhaler in past a lot.   Hard to get  cough to go away  if gets a cold .    Inhalers takes  zyrtec years round   q var   to use when sick .   ROS: See pertinent positives and negatives per HPI.  Past Medical History:  Diagnosis Date   Alcohol addiction (HCC)    Anemia    Anxiety    Back pain    Bipolar depression (HCC)    under psych rx    Carpal tunnel syndrome    Chest pain    Depression    Drug addiction in remission [facility])    heroin   Enteritis due to Norovirus 05/18/2016   Heartburn    Hx of pulmonary embolus 02/13/2015   Hyperlipidemia    Hypokalemia 05/17/2016   IUD (intrauterine device) in place 02/13/2015   Joint pain    Mild mitral regurgitation    Mild tricuspid regurgitation    Mitral regurgitation 06/13/2018   Near syncope 05/16/2016   a. felt due to norovirus.   Nonallopathic lesion of cervical region 09/01/2018   Nonallopathic lesion of rib cage 09/01/2018   Obesity    Orthostatic hypotension 05/18/2016   Palpitations    PCOS (polycystic ovarian syndrome) 02/13/2015   Prediabetes    Pulmonary embolism (HCC) 2011   neg heme evaluation  felt to be from ocps     Scapular dyskinesis 09/01/2018   Smoker 12/30/2016   Syncope and collapse 05/16/2016   Thoracic outlet syndrome of left thoracic outlet 02/15/2018   Thoracic radiculopathy due to degenerative joint disease of spine 10/13/2016   CT and XR show at least 3 levels of DDD of thoracic spine  I suspect this is related to both the scoliosis and the history of dance    Family History  Problem Relation Age of Onset   Hyperlipidemia Mother    Mental illness Mother    Depression Mother    Anxiety disorder Mother    Bipolar disorder Mother    Anxiety disorder Father    Depression Father    Hypertension Father    Irritable bowel syndrome Father    Sleep apnea Father    Obesity Father    Colon cancer Maternal Grandfather     Social History   Socioeconomic History   Marital status: Married    Spouse name: Not on file   Number of children: Not on file   Years of education: Not on file   Highest education level: Bachelor's  degree (e.g., BA, AB, BS)  Occupational History   Occupation: nanny   Occupation: Producer, television/film/video in Marketing & Outreach  Tobacco Use   Smoking status: Former    Current packs/day: 0.00    Types: Cigarettes    Quit date: 12/20/2015    Years since quitting: 7.3   Smokeless tobacco: Never  Vaping Use   Vaping status: Never Used  Substance and Sexual Activity   Alcohol use: No    Alcohol/week: 0.0 standard drinks of alcohol   Drug use: No   Sexual activity: Not on file    Comment: previous IUD  Other Topics Concern   Not on file  Social History Narrative   5-10 hours of sleep per night   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)   Recovering from drug and alcohol addiction   Joined [employer]   Lives with her parents   2 dogs in the home      [place] 3 years  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       Right Handed    Lives in a one story home    Social Drivers of Health   Financial Resource Strain: Medium Risk (04/14/2023)   Overall Financial Resource Strain  (CARDIA)    Difficulty of Paying Living Expenses: Somewhat hard  Food Insecurity: Food Insecurity Present (04/14/2023)   Hunger Vital Sign    Worried About Running Out of Food in the Last Year: Sometimes true    Ran Out of Food in the Last Year: Never true  Transportation Needs: No Transportation Needs (04/14/2023)   PRAPARE - Administrator, Civil Service (Medical): No    Lack of Transportation (Non-Medical): No  Physical Activity: Insufficiently Active (04/14/2023)   Exercise Vital Sign    Days of Exercise per Week: 1 day    Minutes of Exercise per Session: 30 min  Stress: No Stress Concern Present (04/14/2023)   [employer] of Occupational Health - Occupational Stress Questionnaire    Feeling of Stress : Only a little  Social Connections: Unknown (04/14/2023)   Social Connection and Isolation Panel [NHANES]    Frequency of Communication with Friends and Family: More than three times a week    Frequency of Social Gatherings with Friends and Family: Three times a week    Attends Religious Services: Never    Active Member of Clubs or Organizations: Patient declined    Attends Banker Meetings: Not on file    Marital Status: Married    Outpatient Medications Prior to Visit  Medication Sig Dispense Refill   albuterol (PROAIR HFA) 108 (90 Base) MCG/ACT inhaler Inhale 2 puffs into the lungs every 6 (six) hours as needed for wheezing or shortness of breath. 1 each 1   aspirin EC 81 MG tablet Take 81 mg by mouth daily. Swallow whole.     busPIRone (BUSPAR) 15 MG tablet Take 1 tablet (15 mg total) by mouth 2 (two) times daily. 180 tablet 0   cetirizine (ZYRTEC) 10 MG tablet Take 10 mg by mouth daily.     lamoTRIgine (LAMICTAL) 100 MG tablet Take 1 tablet (100 mg total) by mouth daily. 90 tablet 0   meloxicam (MOBIC) 15 MG tablet Take 1 tablet (15 mg total) by mouth daily. (Patient taking differently: Take 15 mg by mouth as needed.) 90 tablet 0   metFORMIN  (GLUCOPHAGE) 500 MG tablet Take 1 tablet (500 mg total) by mouth 2 (two) times daily with a meal.  60 tablet 0   paragard intrauterine copper IUD IUD 1 each by Intrauterine route once.     Phentermine HCl (LOMAIRA) 8 MG TABS Take 1 tablet (8 mg total) by mouth daily. 30 tablet 0   sertraline (ZOLOFT) 50 MG tablet Take 1 tablet (50 mg total) by mouth daily. 90 tablet 0   topiramate (TOPAMAX) 50 MG tablet Take 1 tablet (50 mg total) by mouth daily. 30 tablet 0   Vitamin D, Ergocalciferol, (DRISDOL) 1.25 MG (50000 UNIT) CAPS capsule Take 1 capsule (50,000 Units total) by mouth every 7 (seven) days. 4 capsule 0   No facility-administered medications prior to visit.     EXAM:  BP 90/60 (BP Location: Right Arm, Patient Position: Sitting, Cuff Size: Normal)   Pulse 84   Temp 97.8 F (36.6 C) (Oral)   Ht 5\' 4"  (1.626 m)   Wt 205 lb (93 kg)   LMP 03/23/2023 (Approximate)   SpO2 98%   BMI 35.19 kg/m   Body mass index is 35.19 kg/m.  GENERAL: vitals reviewed and listed above, alert, oriented, appears well hydrated and in no acute distress HEENT: atraumatic, conjunctiva  clear, no obvious abnormalities on inspection of external nose and earstm clear  OP : no lesion edema or exudate  NECK: no obvious masses on inspection palpation  LUNGS: clear to auscultation bilaterally, no wheezes, rales or rhonchi, good air movement CV: HRRR, no clubbing cyanosis or  peripheral edema nl cap refill  MS: moves all extremities without noticeable focal  abnormality PSYCH: pleasant and cooperative, no obvious depression or anxiety Lab Results  Component Value Date   WBC 7.7 09/15/2022   HGB 12.9 09/15/2022   HCT 39.2 09/15/2022   PLT 334 09/15/2022   GLUCOSE 81 09/15/2022   CHOL 191 09/15/2022   TRIG 127 09/15/2022   HDL 45 09/15/2022   LDLDIRECT 150.0 02/13/2015   LDLCALC 123 (H) 09/15/2022   ALT 15 09/15/2022   AST 11 09/15/2022   NA 139 09/15/2022   K 4.3 09/15/2022   CL 102 09/15/2022    CREATININE 0.71 09/15/2022   BUN 9 09/15/2022   CO2 22 09/15/2022   TSH 2.400 09/15/2022   INR 1.0 12/11/2019   HGBA1C 5.7 (H) 09/15/2022   BP Readings from Last 3 Encounters:  04/15/23 90/60  03/25/23 113/75  02/23/23 124/79   Neg covid flu today  ASSESSMENT AND PLAN:  Discussed the following assessment and plan:  Cough, unspecified type - Plan: POC COVID-19, POC Influenza A/B, Ambulatory referral to Allergy  Sore throat - Plan: POC Rapid Strep A  Nasal congestion - Plan: POC COVID-19, POC Influenza A/B  Wheezing - Plan: Ambulatory referral to Allergy  Allergic rhinitis, unspecified seasonality, unspecified trigger - ? cats cold and seasons - Plan: Ambulatory referral to Allergy  URI, acute  Medication management Suspect underlying seasonal and environmental allergy and  poss cough variant asthma   Poss  secondary asthmatic bronchitis at times  Effecting her life work and wellbeing as the prolonged cough when occurs  up to 2 mos   Recent HB on weight loss meds but this all predates and dont think a pimary cause problem  Today seems like sinple uri and clear lungs Stop q var and change to breo every day for now  disc  controller vx rescue meds   Allergy asthma referral consult to help  mitigate her coughing illness Get appt with [name] first if flare to see if can help monitor and rx in  interim .  Hot  tea and honey  since cough meds dont seem to work much   -Patient advised to return or notify health care team  if  new concerns arise.  Patient Instructions  This acts like asthma with triggers  allergy and or  viral resp infection.  Take controller inhaler every day  and can use albuterol as rescue.   In interim   You should be notified about allergy asthma referral appt.  Get back with [name] in interim if flaring.         Neta Mends. Keaton Beichner M.D.

## 2023-04-15 NOTE — Patient Instructions (Addendum)
This acts like asthma with triggers  allergy and or  viral resp infection.  Take controller inhaler every day  and can use albuterol as rescue.   In interim   You should be notified about allergy asthma referral appt.  Get back with [name] in interim if flaring.

## 2023-04-18 ENCOUNTER — Other Ambulatory Visit (INDEPENDENT_AMBULATORY_CARE_PROVIDER_SITE_OTHER): Payer: Self-pay | Admitting: Family Medicine

## 2023-04-18 DIAGNOSIS — F5089 Other specified eating disorder: Secondary | ICD-10-CM

## 2023-04-23 ENCOUNTER — Other Ambulatory Visit (INDEPENDENT_AMBULATORY_CARE_PROVIDER_SITE_OTHER): Payer: Self-pay | Admitting: Family Medicine

## 2023-04-23 DIAGNOSIS — F5089 Other specified eating disorder: Secondary | ICD-10-CM

## 2023-04-27 ENCOUNTER — Encounter (INDEPENDENT_AMBULATORY_CARE_PROVIDER_SITE_OTHER): Payer: Self-pay | Admitting: Family Medicine

## 2023-04-27 ENCOUNTER — Ambulatory Visit (INDEPENDENT_AMBULATORY_CARE_PROVIDER_SITE_OTHER): Payer: BC Managed Care – PPO | Admitting: Family Medicine

## 2023-04-27 VITALS — BP 129/83 | HR 86 | Temp 98.3°F | Ht 64.0 in | Wt 197.0 lb

## 2023-04-27 DIAGNOSIS — R7303 Prediabetes: Secondary | ICD-10-CM | POA: Diagnosis not present

## 2023-04-27 DIAGNOSIS — F5089 Other specified eating disorder: Secondary | ICD-10-CM

## 2023-04-27 DIAGNOSIS — E66812 Morbid (severe) obesity due to excess calories: Secondary | ICD-10-CM

## 2023-04-27 DIAGNOSIS — E669 Obesity, unspecified: Secondary | ICD-10-CM

## 2023-04-27 DIAGNOSIS — E7849 Other hyperlipidemia: Secondary | ICD-10-CM

## 2023-04-27 DIAGNOSIS — E559 Vitamin D deficiency, unspecified: Secondary | ICD-10-CM | POA: Diagnosis not present

## 2023-04-27 DIAGNOSIS — Z6836 Body mass index (BMI) 36.0-36.9, adult: Secondary | ICD-10-CM

## 2023-04-27 MED ORDER — TOPIRAMATE 50 MG PO TABS: 50.0000 mg | ORAL_TABLET | Freq: Every day | ORAL | 0 refills | Status: DC

## 2023-04-27 MED ORDER — VITAMIN D (ERGOCALCIFEROL) 1.25 MG (50000 UNIT) PO CAPS: 50000.0000 [IU] | ORAL_CAPSULE | ORAL | 0 refills | Status: DC

## 2023-04-27 MED ORDER — LOMAIRA 8 MG PO TABS: 8.0000 mg | ORAL_TABLET | Freq: Every day | ORAL | 0 refills | Status: DC

## 2023-04-27 MED ORDER — METFORMIN HCL 500 MG PO TABS: 500.0000 mg | ORAL_TABLET | Freq: Two times a day (BID) | ORAL | 0 refills | Status: DC

## 2023-04-27 NOTE — Progress Notes (Signed)
 Sheri [name], D.O.  ABFM, ABOM Specializing in Clinical Bariatric Medicine  Office located at: [address] [place], [place]  [address]   Assessment and Plan:   FOR THE DISEASE OF OBESITY: Obesity with starting BMI of 36.8 Assessment & Plan: Since last office visit on 03/25/23 patient's  Muscle mass has decreased by 0.4 lb. Fat mass has decreased by 2 lb. Total body water has decreased by 0.4 lb.  Counseling done on how various foods will affect these numbers and how to maximize success  Total lbs lost to date: 17 lbs  Total weight loss percentage to date: 7.94%    Recommended Dietary Goals Keirra is currently in the action stage of change. As such, her goal is to continue weight management plan.  She has agreed to: continue current plan   Behavioral Intervention We discussed the following today: keeping healthy foods at home, high protein foods, and working on managing stress through purposeful exercise.   Additional resources provided today: None  Evidence-based interventions for health behavior change were utilized today including the discussion of self monitoring techniques, problem-solving barriers and SMART goal setting techniques.   Regarding patient's less desirable eating habits and patterns, we employed the technique of small changes.   Pt will specifically work on: n/a   Recommended Physical Activity Goals Tammela has been advised to work up to 150 minutes of moderate intensity aerobic activity a week and strengthening exercises 2-3 times per week for cardiovascular health, weight loss maintenance and preservation of muscle mass.   She has agreed to : Continue current level of physical activity    Pharmacotherapy We both agreed to : continue with nutritional and behavioral strategies   FOR ASSOCIATED CONDITIONS ADDRESSED TODAY:  Prediabetes Assessment & Plan: Pt endorses that she is generally not a carb-craver, however in the past several  weeks she has been really cravings carbs at night. She is currently on Metformin  500 mg bid, which she takes first thing in the morning and at night.   Pt instructed to take her Metformin  at lunch and dinner time to have better coverage at night. Reminded patient that having protein with each meal is important for controlling hunger and cravings, increasing muscle mass, and improving metabolism. Continue with weight loss therapy. Recheck labs today.   Orders: -     metFORMIN  HCl; Take 1 tablet (500 mg total) by mouth 2 (two) times daily with a meal. W/ lunch and dinner  Dispense: 60 tablet; Refill: 0 -     Insulin , random -     Hemoglobin A1c   Other disorder of eating Assessment & Plan: Relevant Medications: Lomaira  8 mg daily and Topiramate  50 mg daily at breakfast. In the past week, pt endorses binge-eating secondary to PMS.  In general, in the past several weeks she has been really cravings carbs at night.  Discussed strategies (e.g exercise) pt can implement prior to her menstrual cycle. Pt advised to take Topiramate  at lunch instead of breakfast for better coverage at night. In the future, we may have pt take Topiramate  twice daily 1 week prior to her period. Continue with weight loss therapy.   Orders: -     Topiramate ; Take 1 tablet (50 mg total) by mouth daily. At lunch  Dispense: 30 tablet; Refill: 0 -     Lomaira ; Take 1 tablet (8 mg total) by mouth daily.  Dispense: 30 tablet; Refill: 0 -     Vitamin B12   Other hyperlipidemia  Assessment & Plan: No current meds. Diet/exercise approach. Pt with hx of elevated LDL at 123 roughly 7 months ago. Will order lipid panel & CMP today. Continue with our heart-healthy, low cholesterol meal plan.   Orders: -     Lipid panel -     Comprehensive metabolic panel   Vitamin D  deficiency Assessment & Plan: Most recent vitamin D :  Lab Results  Component Value Date   VD25OH 32.6 09/15/2022    No issues with ERGO 50,000 units once a  week, compliance and tolerance good. Continue with weight loss efforts and prescription high dose vitamin D  as prescribed. Vitamin D ; future.   Orders: -     Vitamin D  (Ergocalciferol ); Take 1 capsule (50,000 Units total) by mouth every 7 (seven) days.  Dispense: 4 capsule; Refill: 0 -     VITAMIN D  25 Hydroxy (Vit-D Deficiency, Fractures)   Follow up:   Return 05/25/23. She was informed of the importance of frequent follow up visits to maximize her success with intensive lifestyle modifications for her multiple health conditions.  Sania Marletta Bousquet will come in 2-3 days prior to next OV for labs and is aware that we will review all of her lab results at our next visit.  She is aware that if anything is critical/ life threatening with the results, we will be contacting her via MyChart prior to the office visit to discuss management.    Subjective:   Chief complaint: Obesity [name] is here to discuss her progress with her obesity treatment plan. She is on the the Category 3 Plan and states she is following her eating plan approximately 50% of the time. She states she is walking 30 minutes 2 days per week.  Interval History:  [name] is here for a follow up office visit. Since last OV,  [name] is down 3 lbs. She feels that she ate well over the holiday period by incorporating our holiday eating strategies. However, she endorses slipping last week due to PMS. She reports sometimes binge-eating  while pmsing. She also finds it difficult to get in all her protein.   Pharmacotherapy for weight loss: She is currently taking  Metformin  500 mg bid, Topamax  50 mg daily, Lomaira  8 mg daily .   Review of Systems:  Pertinent positives were addressed with patient today.  Reviewed by clinician on day of visit: allergies, medications, problem list, medical history, surgical history, family history, social history, and previous encounter notes.  Weight Summary and Biometrics    Weight Lost Since Last Visit: 3lb  Weight Gained Since Last Visit: 0lb   Vitals Temp: 98.3 F (36.8 C) BP: 129/83 Pulse Rate: 86 SpO2: 100 %   Anthropometric Measurements Height: 5' 4 (1.626 m) Weight: 197 lb (89.4 kg) BMI (Calculated): 33.8 Weight at Last Visit: 200lb Weight Lost Since Last Visit: 3lb Weight Gained Since Last Visit: 0lb Starting Weight: 214lb Total Weight Loss (lbs): 17 lb (7.711 kg) Peak Weight: 225lb   Body Composition  Body Fat %: 40.4 % Fat Mass (lbs): 79.8 lbs Muscle Mass (lbs): 111.8 lbs Total Body Water (lbs): 78.2 lbs Visceral Fat Rating : 8   Other Clinical Data Fasting: no Labs: no Today's Visit #: 10 Starting Date: 09/15/22   Objective:   PHYSICAL EXAM: Blood pressure 129/83, pulse 86, temperature 98.3 F (36.8 C), height 5' 4 (1.626 m), weight 197 lb (89.4 kg), SpO2 100%. Body mass index is 33.81 kg/m.  General: she is overweight, cooperative and in no  acute distress. PSYCH: Has normal mood, affect and thought process.   HEENT: EOMI, sclerae are anicteric. Lungs: Normal breathing effort, no conversational dyspnea. Extremities: Moves * 4 Neurologic: A and O * 3, good insight  DIAGNOSTIC DATA REVIEWED: BMET    Component Value Date/Time   NA 139 09/15/2022 0939   K 4.3 09/15/2022 0939   CL 102 09/15/2022 0939   CO2 22 09/15/2022 0939   GLUCOSE 81 09/15/2022 0939   GLUCOSE 73 01/06/2022 1518   BUN 9 09/15/2022 0939   CREATININE 0.71 09/15/2022 0939   CALCIUM 9.1 09/15/2022 0939   GFRNONAA >60 05/17/2016 0453   GFRAA >60 05/17/2016 0453   Lab Results  Component Value Date   HGBA1C 5.7 (H) 09/15/2022   HGBA1C 5.5 02/13/2015   Lab Results  Component Value Date   INSULIN  20.1 09/15/2022   Lab Results  Component Value Date   TSH 2.400 09/15/2022   CBC    Component Value Date/Time   WBC 7.7 09/15/2022 0939   WBC 9.5 01/06/2022 1518   RBC 4.52 09/15/2022 0939   RBC 4.61 01/06/2022 1518   HGB 12.9  09/15/2022 0939   HCT 39.2 09/15/2022 0939   PLT 334 09/15/2022 0939   MCV 87 09/15/2022 0939   MCH 28.5 09/15/2022 0939   MCH 29.6 12/12/2019 0600   MCHC 32.9 09/15/2022 0939   MCHC 33.2 01/06/2022 1518   RDW 13.2 09/15/2022 0939   Iron  Studies    Component Value Date/Time   IRON  54 06/12/2020 1045   FERRITIN 12.1 06/12/2020 1045   IRONPCTSAT 11.7 (L) 06/12/2020 1045   Lipid Panel     Component Value Date/Time   CHOL 191 09/15/2022 0939   TRIG 127 09/15/2022 0939   HDL 45 09/15/2022 0939   CHOLHDL 4 06/12/2020 1045   VLDL 27.8 06/12/2020 1045   LDLCALC 123 (H) 09/15/2022 0939   LDLDIRECT 150.0 02/13/2015 1449   Hepatic Function Panel     Component Value Date/Time   PROT 7.2 09/15/2022 0939   ALBUMIN 4.4 09/15/2022 0939   AST 11 09/15/2022 0939   ALT 15 09/15/2022 0939   ALKPHOS 61 09/15/2022 0939   BILITOT 0.2 09/15/2022 0939   BILIDIR 0.1 01/06/2022 1518      Component Value Date/Time   TSH 2.400 09/15/2022 9060   Nutritional Lab Results  Component Value Date   VD25OH 32.6 09/15/2022   Attestations:   I, Special Puri, acting as a stage manager for [employer], DO., have compiled all relevant documentation for today's office visit on behalf of Sheri Jenkins, DO, while in the presence of [employer], DO.  Patient was in the office today and time spent on visit including pre-visit chart review and post-visit care/coordination of care and electronic medical record documentation was 40 minutes. 50% of the time was in face to face counseling of this patient's medical condition(s) and providing education on treatment options to include the first-line treatment of diet and lifestyle modification.   I have reviewed the above documentation for accuracy and completeness, and I agree with the above. Sheri JINNY [name], D.O.  The 21st Century Cures Act was signed into law in 2016 which includes the topic of electronic health records.  This provides immediate  access to information in MyChart.  This includes consultation notes, operative notes, office notes, lab results and pathology reports.  If you have any questions about what you read please let us  know at your next visit so we can discuss your  concerns and take corrective action if need be.  We are right here with you.

## 2023-05-04 ENCOUNTER — Ambulatory Visit (INDEPENDENT_AMBULATORY_CARE_PROVIDER_SITE_OTHER): Payer: BC Managed Care – PPO | Admitting: Family Medicine

## 2023-05-09 ENCOUNTER — Other Ambulatory Visit (HOSPITAL_COMMUNITY): Payer: Self-pay | Admitting: Psychiatry

## 2023-05-09 DIAGNOSIS — F39 Unspecified mood [affective] disorder: Secondary | ICD-10-CM

## 2023-05-09 DIAGNOSIS — F419 Anxiety disorder, unspecified: Secondary | ICD-10-CM

## 2023-05-10 ENCOUNTER — Other Ambulatory Visit: Payer: Self-pay | Admitting: Family Medicine

## 2023-05-13 ENCOUNTER — Encounter (HOSPITAL_COMMUNITY): Payer: Self-pay | Admitting: Psychiatry

## 2023-05-13 ENCOUNTER — Telehealth (HOSPITAL_COMMUNITY): Payer: BC Managed Care – PPO | Admitting: Psychiatry

## 2023-05-13 VITALS — Wt 197.0 lb

## 2023-05-13 DIAGNOSIS — F419 Anxiety disorder, unspecified: Secondary | ICD-10-CM

## 2023-05-13 DIAGNOSIS — F39 Unspecified mood [affective] disorder: Secondary | ICD-10-CM | POA: Diagnosis not present

## 2023-05-13 MED ORDER — LAMOTRIGINE 100 MG PO TABS: 100.0000 mg | ORAL_TABLET | Freq: Every day | ORAL | 0 refills | Status: DC

## 2023-05-13 MED ORDER — BUSPIRONE HCL 15 MG PO TABS: 15.0000 mg | ORAL_TABLET | Freq: Two times a day (BID) | ORAL | 0 refills | Status: DC

## 2023-05-13 MED ORDER — SERTRALINE HCL 50 MG PO TABS: 50.0000 mg | ORAL_TABLET | Freq: Every day | ORAL | 0 refills | Status: DC

## 2023-05-13 NOTE — Progress Notes (Signed)
[name] MD Virtual Progress Note   Patient Location: In Car Provider Location: Office  I connect with patient by video and verified that I am speaking with correct person by using two identifiers. I discussed the limitations of evaluation and management by telemedicine and the availability of in person appointments. I also discussed with the patient that there may be a patient responsible charge related to this service. The patient expressed understanding and agreed to proceed.  She[name] [id number] 33 y.o.  05/13/2023 8:37 AM  History of Present Illness:  Patient is evaluated by video session.  She reported things are going okay and she described her mood is stable.  She denies any mania, psychosis, panic attack or any irritability.  She sleeps good.  She started weight loss program and taking Topamax and phentermine.  She had lost 10 pounds since started these medication.  She has no tremors, shakes or any itching.  She has no rash.  Her job is going very well and she really likes it.  She reported her mother recently diagnosed with tardive dyskinesia.  Her mother sees provider [facility].  She like to discuss more about the tardive dyskinesia if it runs in the family.  She do not reported any symptoms related to tardive dyskinesia.  She lives with her 29 year old stepson and 75-year-old daughter.  She reported family life is going very well.  She had a good support from her husband.  She feels proud and happy as last Christmas it was 9 years of sobriety from drinking and drugs.  She reported her energy level is good.  She wants to keep the current medication.  Past Psychiatric History: H/O depression, anxiety, Bipolar, post partum and ADHD. On medication since age 14.  H/O addiction with opiates, Benzo and Adderall. Received therapy and medications at [facility] counseling and [facility] but not happy with care. Had rehab at [facility] in 2015.  Did  well for 5 years without medication until symptoms returns. H/O suicidal thoughts but no attempt. No psychosis.  Tried Celexa caused dizziness, trazodone, weight gain.  Took Lamictal, Klonopin, Lexapro, hydroxyzine, Ambien, Seroquel, Latuda, Hydroxyzine and Wellbutrin.        Outpatient Encounter Medications as of 05/13/2023  Medication Sig   albuterol (PROAIR HFA) 108 (90 Base) MCG/ACT inhaler Inhale 2 puffs into the lungs every 6 (six) hours as needed for wheezing or shortness of breath.   aspirin EC 81 MG tablet Take 81 mg by mouth daily. Swallow whole.   busPIRone (BUSPAR) 15 MG tablet Take 1 tablet (15 mg total) by mouth 2 (two) times daily.   cetirizine (ZYRTEC) 10 MG tablet Take 10 mg by mouth daily.   fluticasone furoate-vilanterol (BREO ELLIPTA) 100-25 MCG/ACT AEPB Inhale 1 puff into the lungs daily.   lamoTRIgine (LAMICTAL) 100 MG tablet Take 1 tablet (100 mg total) by mouth daily.   meloxicam (MOBIC) 15 MG tablet TAKE 1 TABLET (15 MG TOTAL) BY MOUTH DAILY.   metFORMIN (GLUCOPHAGE) 500 MG tablet Take 1 tablet (500 mg total) by mouth 2 (two) times daily with a meal. W/ lunch and dinner   paragard intrauterine copper IUD IUD 1 each by Intrauterine route once.   Phentermine HCl (LOMAIRA) 8 MG TABS Take 1 tablet (8 mg total) by mouth daily.   sertraline (ZOLOFT) 50 MG tablet Take 1 tablet (50 mg total) by mouth daily.   topiramate (TOPAMAX) 50 MG tablet Take 1 tablet (50 mg total) by mouth daily. At lunch  Vitamin D, Ergocalciferol, (DRISDOL) 1.25 MG (50000 UNIT) CAPS capsule Take 1 capsule (50,000 Units total) by mouth every 7 (seven) days.   No facility-administered encounter medications on file as of 05/13/2023.    Recent Results (from the past 2160 hours)  POC Rapid Strep A     Status: None   Collection Time: 04/15/23  9:07 AM  Result Value Ref Range   Rapid Strep A Screen Negative Negative  POC COVID-19     Status: None   Collection Time: 04/15/23  9:36 AM  Result Value Ref  Range   SARS Coronavirus 2 Ag Negative Negative  POC Influenza A/B     Status: None   Collection Time: 04/15/23  9:36 AM  Result Value Ref Range   Influenza A, POC Negative Negative   Influenza B, POC Negative Negative     Psychiatric Specialty Exam: Physical Exam  Review of Systems  Weight 197 lb (89.4 kg).There is no height or weight on file to calculate BMI.  General Appearance: Casual  Eye Contact:  Good  Speech:  Normal Rate  Volume:  Normal  Mood:  Euthymic  Affect:  Appropriate  Thought Process:  Goal Directed  Orientation:  Full (Time, Place, and Person)  Thought Content:  Logical  Suicidal Thoughts:  No  Homicidal Thoughts:  No  Memory:  Immediate;   Good Recent;   Good Remote;   Good  Judgement:  Good  Insight:  Good  Psychomotor Activity:  Normal  Concentration:  Concentration: Good and Attention Span: Good  Recall:  Good  Fund of Knowledge:  Good  Language:  Good  Akathisia:  No  Handed:  Right  AIMS (if indicated):     Assets:  Communication Skills Desire for Improvement Housing Resilience Social Support Talents/Skills Transportation  ADL's:  Intact  Cognition:  WNL  Sleep:  ok     Assessment/Plan: Mood disorder (HCC) - Plan: lamoTRIgine (LAMICTAL) 100 MG tablet, sertraline (ZOLOFT) 50 MG tablet  Anxiety - Plan: busPIRone (BUSPAR) 15 MG tablet, lamoTRIgine (LAMICTAL) 100 MG tablet, sertraline (ZOLOFT) 50 MG tablet  I reviewed notes from other provider.  She is at a weight loss program and lost 10 pounds since started Topamax, phentermine.  She also taking metformin for her PCO.  She like to discuss about her dyskinesia as recently mother diagnosed with the TD.  I explained and discussed about tardive dyskinesia, etiology and long-term prognosis.  So far patient has no major concern from the medication.  We discussed in the future if needed may consider lowering the BuSpar but patient feel comfortable with the current dose.  She reported her dry  mouth and dry eyes are also much better and she is taking eyedrops.  She is in therapy with Tresa Endo at [facility] for [facility].  Discussed medication side effects and benefits in detail.  Recommend to call us back if she has any question or any concern.  Follow-up in 3 months.  Continue Lamictal 100 mg daily, BuSpar 50 mg twice a day and Zoloft 50 mg daily.  Follow Up Instructions:     I discussed the assessment and treatment plan with the patient. The patient was provided an opportunity to ask questions and all were answered. The patient agreed with the plan and demonstrated an understanding of the instructions.   The patient was advised to call back or seek an in-person evaluation if the symptoms worsen or if the condition fails to improve as anticipated.    Collaboration  of Care: Other provider involved in patient's care AEB notes are available in epic to review  Patient/Guardian was advised Release of Information must be obtained prior to any record release in order to collaborate their care with an outside provider. Patient/Guardian was advised if they have not already done so to contact the registration department to sign all necessary forms in order for [name] to release information regarding their care.   Consent: Patient/Guardian gives verbal consent for treatment and assignment of benefits for services provided during this visit. Patient/Guardian expressed understanding and agreed to proceed.     I provided 28 minutes of non face to face time during this encounter.  Discussed mostly medication side effects and benefits.  Patient has question about tardive dyskinesia and she was given education, etiology and long-term prognosis about the tardive dyskinesia.  Note: This document was prepared by [employer] voice dictation technology and any errors that results from this process are unintentional.    Cleotis Nipper, MD 05/13/2023

## 2023-05-21 ENCOUNTER — Ambulatory Visit: Payer: BC Managed Care – PPO | Admitting: Allergy

## 2023-05-21 ENCOUNTER — Other Ambulatory Visit (HOSPITAL_COMMUNITY): Payer: Self-pay

## 2023-05-21 ENCOUNTER — Telehealth: Payer: Self-pay

## 2023-05-21 ENCOUNTER — Other Ambulatory Visit: Payer: Self-pay

## 2023-05-21 ENCOUNTER — Other Ambulatory Visit: Payer: Self-pay | Admitting: Allergy

## 2023-05-21 ENCOUNTER — Encounter: Payer: Self-pay | Admitting: Allergy

## 2023-05-21 VITALS — BP 124/76 | HR 100 | Temp 98.0°F | Resp 18 | Ht 64.0 in | Wt 202.8 lb

## 2023-05-21 DIAGNOSIS — Z86711 Personal history of pulmonary embolism: Secondary | ICD-10-CM | POA: Diagnosis not present

## 2023-05-21 DIAGNOSIS — J3089 Other allergic rhinitis: Secondary | ICD-10-CM | POA: Diagnosis not present

## 2023-05-21 DIAGNOSIS — B999 Unspecified infectious disease: Secondary | ICD-10-CM

## 2023-05-21 DIAGNOSIS — J4530 Mild persistent asthma, uncomplicated: Secondary | ICD-10-CM

## 2023-05-21 DIAGNOSIS — J4540 Moderate persistent asthma, uncomplicated: Secondary | ICD-10-CM | POA: Diagnosis not present

## 2023-05-21 MED ORDER — FLUTICASONE FUROATE-VILANTEROL 100-25 MCG/ACT IN AEPB: 1.0000 | INHALATION_SPRAY | Freq: Every day | RESPIRATORY_TRACT | 5 refills | Status: DC

## 2023-05-21 MED ORDER — AIRSUPRA 90-80 MCG/ACT IN AERO: 2.0000 | INHALATION_SPRAY | RESPIRATORY_TRACT | 2 refills | Status: DC | PRN

## 2023-05-21 NOTE — Telephone Encounter (Signed)
*  Asthma/Allergy  Pharmacy Patient Advocate Encounter   Received notification from RX Request Messages that prior authorization for Airsupra 90-80MCG/ACT aerosol  is required/requested.   Insurance verification completed.   The patient is insured through [facility] .   Per test claim: PA required; PA submitted to above mentioned insurance via CoverMyMeds Key/confirmation #/EOC [id number] Status is pending

## 2023-05-21 NOTE — Patient Instructions (Addendum)
Breathing/asthma  Daily controller medication(s): continue Breo 1 puff once a day and rinse mouth after each.   May use Airsupra rescue inhaler 2 puffs every 4 to 6 hours as needed for shortness of breath, chest tightness, coughing, and wheezing. Do not use more than 12 puffs in 24 hours. May use Airsupra rescue inhaler 2 puffs 5 to 15 minutes prior to strenuous physical activities. Rinse mouth after each use.  Coupon given.  Monitor frequency of use - if you need to use it more than twice per week on a consistent basis let us know.  Breathing control goals:  Full participation in all desired activities (may need albuterol before activity) Albuterol use two times or less a week on average (not counting use with activity) Cough interfering with sleep two times or less a month Oral steroids no more than once a year No hospitalizations   Environmental allergies Return for allergy skin testing. Will make additional recommendations based on results. Make sure you don't take any antihistamines for 3 days before the skin testing appointment. Don't put any lotion on the back and arms on the day of testing.  Plan on being here for 30-60 minutes.   Infections Keep track of infections and antibiotics use. If persistent will get bloodwork next to look at immune system.   Follow up in 3 months for asthma check.

## 2023-05-21 NOTE — Progress Notes (Signed)
New Patient Note  RE: Sheri [name] MRN: [id number] DOB: 1990/10/08 Date of Office Visit: 05/21/2023  Consult requested by: Madelin Headings, MD Primary care provider: Madelin Headings, MD  Chief Complaint: Establish Care (For years, about 2x a year, would have a cough that lasts for month with no resolve. This year when the seasons got colder, would hear a whistling nose when breathing. When talking, would lose her voice and would have to clear her throat to bring it back. )  History of Present Illness: I had the pleasure of seeing Sheri [name] for initial evaluation at the [facility] on 05/21/2023. She is a 33 y.o. female, who is referred here by Panosh, Neta Mends, MD for the evaluation of asthma.  Discussed the use of AI scribe software for clinical note transcription with the patient, who gave verbal consent to proceed.  The patient, with a history of pulmonary embolisms, presents with chronic respiratory issues and wheezing exacerbated by cold weather and infections.   She has experienced chronic respiratory issues for years, characterized by prolonged coughing episodes that can last for months after an illness. These episodes are severe, leading to vomiting and urinary incontinence. She also experiences wheezing, chest tightness, and shortness of breath. Cold weather exacerbates her symptoms, leading to increased wheezing without significant shortness of breath.  She has been using a Breo inhaler once daily for the past month, which has significantly improved her symptoms, reducing the frequency of wheezing and the need for her rescue inhaler. Prior to starting [facility], she used her albuterol inhaler daily during cold weather.   She has a history of pulmonary embolisms in her twenties, attributed to birth control use, which resulted in hospitalization. She was treated with heparin and Lovenox during pregnancy and currently takes aspirin and sees a cardiologist  annually. She has had pneumonia a couple of times and has been treated with steroids and antibiotics for respiratory issues in the past.  She experiences seasonal allergies, particularly in spring and fall, and is allergic to cats. She takes Zyrtec for allergy symptoms, which she finds helpful. She has a history of smoking a few cigarettes a day for a couple of years, but she no longer smokes. She has received flu and COVID vaccines and had COVID once.      She reports symptoms of coughing with post tussive emesis, chest tightness, shortness of breath, wheezing, nocturnal awakenings for many years. She reports not using aerochamber with inhalers. She tried the following inhalers: Breo 1 puff once a day x 1 month and albuterol prn. Main triggers are infections, cold weather.   In the last month, frequency of symptoms: daily. Frequency of nocturnal symptoms: depends. Frequency of SABA use: daily initially. Interference with physical activity: yes. Sleep is disturbed. In the last 12 months, emergency room visits/urgent care visits/doctor office visits or hospitalizations due to respiratory issues: 3 x to UC, 1-2 x to PCP. In the last 12 months, oral steroids courses: 1-2 times. Lifetime history of hospitalization for respiratory issues: [facility] in her 11s. Prior intubations: no. History of pneumonia: yes. She was evaluated by pulmonologist in the past. Smoking exposure: smokes for a few years. Up to date with flu vaccine: yes. Up to date with COVID-19 vaccine: yes. Prior Covid-19 infection: one time. History of reflux: sometimes.   Assessment and Plan: [name] is a 33 y.o. female with: Moderate persistent asthma without complication Chronic cough, wheezing, and shortness of breath, exacerbated by  cold weather and infections. Symptoms have improved significantly with Breo inhaler. History of severe coughing episodes lasting months after illness. Today's spirometry was normal. Daily controller  medication(s): continue Breo 1 puff once a day and rinse mouth after each.  May use Airsupra rescue inhaler 2 puffs every 4 to 6 hours as needed for shortness of breath, chest tightness, coughing, and wheezing. Do not use more than 12 puffs in 24 hours. May use Airsupra rescue inhaler 2 puffs 5 to 15 minutes prior to strenuous physical activities. Rinse mouth after each use.  Coupon given.  Monitor frequency of use - if you need to use it more than twice per week on a consistent basis let us know.   Other allergic rhinitis Symptoms in spring and fall, allergic to cats. Return for allergy skin testing. Will make additional recommendations based on results.  Recurrent infections Frequent respiratory infections. H/o pneumonia and bronchitis. Keep track of infections and antibiotics use. If persistent will get bloodwork next to look at immune system.   History of pulmonary embolus (PE) No recent issues. Continue aspirin. Annual follow-up with cardiologist.  Return in about 3 months (around 08/18/2023).  Meds ordered this encounter  Medications   fluticasone furoate-vilanterol (BREO ELLIPTA) 100-25 MCG/ACT AEPB    Sig: Inhale 1 puff into the lungs daily. Rinse mouth after each use.    Dispense:  30 each    Refill:  5   Albuterol-Budesonide (AIRSUPRA) 90-80 MCG/ACT AERO    Sig: Inhale 2 puffs into the lungs every 4 (four) hours as needed (coughing, wheezing, chest tightness). Do not exceed 12 puffs in 24 hours.    Dispense:  10.7 g    Refill:  2    BIN: [id number], PCN: [id number], GRP: [id number], ID [id number]   Lab Orders  No laboratory test(s) ordered today    Other allergy screening: Rhino conjunctivitis: yes In the spring and fall, and also around cats. Takes zyrtec with good benefit.  Food allergy: no Medication allergy: no Hymenoptera allergy: no Urticaria: hives in the spring Eczema:no History of recurrent infections suggestive of immunodeficency:  frequent respiratory  infections.  Diagnostics: Spirometry:  Tracings reviewed. Her effort: Good reproducible efforts. FVC: 3.51L FEV1: 3.06L, 97% predicted FEV1/FVC ratio: 87% Interpretation: Spirometry consistent with normal pattern.  Please see scanned spirometry results for details.  Results discussed with patient/family.   Past Medical History: Patient Active Problem List   Diagnosis Date Noted   Disorder of eating 02/23/2023   SI (sacroiliac) joint dysfunction 11/27/2022   Prediabetes 10/28/2022   Vitamin D deficiency 10/28/2022   Class 2 severe obesity with serious comorbidity and body mass index (BMI) of 36.0 to 36.9 in adult [facility]) 10/28/2022   Palpitations 02/06/2021   Acute sinusitis 08/17/2020   Carpal tunnel syndrome 05/16/2020   Maternal anemia, with delivery - IDA w/ superimposed ABL 12/12/2019   Mood disorder (HCC) 12/12/2019   Perineal laceration, second degree 12/12/2019   Acute urinary retention 12/12/2019   History of PCOS 12/12/2019   Encounter for induction of labor 12/11/2019   SVD (spontaneous vaginal delivery) 8/23 12/11/2019   Postpartum care following vaginal delivery 8/23 12/11/2019   Nonallopathic lesion of lumbosacral region 07/11/2019   Nonallopathic lesion of sacral region 07/11/2019   Scapular dyskinesis 09/01/2018   Nonallopathic lesion of thoracic region 09/01/2018   Nonallopathic lesion of rib cage 09/01/2018   Nonallopathic lesion of cervical region 09/01/2018   Mitral regurgitation 06/13/2018   Thoracic outlet syndrome of left thoracic outlet 02/15/2018  Left shoulder pain 01/11/2018   Pain in thoracic spine 08/16/2017   Hx of opioid abuse (HCC) 12/30/2016   Thoracic radiculopathy due to degenerative joint disease of spine 10/13/2016   Orthostatic hypotension 05/18/2016   Syncope and collapse 05/16/2016   PCOS (polycystic ovarian syndrome) 02/13/2015   IUD (intrauterine device) in place 02/13/2015   History of pulmonary embolus (PE) 02/13/2015   Past  Medical History:  Diagnosis Date   Alcohol addiction (HCC)    Anemia    Anxiety    Back pain    Bipolar depression (HCC)    under psych rx    Carpal tunnel syndrome    Chest pain    Depression    Drug addiction in remission [facility])    heroin   Enteritis due to Norovirus 05/18/2016   Heartburn    Hx of pulmonary embolus 02/13/2015   Hyperlipidemia    Hypokalemia 05/17/2016   IUD (intrauterine device) in place 02/13/2015   Joint pain    Mild mitral regurgitation    Mild tricuspid regurgitation    Mitral regurgitation 06/13/2018   Near syncope 05/16/2016   a. felt due to norovirus.   Nonallopathic lesion of cervical region 09/01/2018   Nonallopathic lesion of rib cage 09/01/2018   Obesity    Orthostatic hypotension 05/18/2016   Palpitations    PCOS (polycystic ovarian syndrome) 02/13/2015   Prediabetes    Pulmonary embolism (HCC) 2011   neg heme evaluation  felt to be from ocps    Recurrent upper respiratory infection (URI)    Scapular dyskinesis 09/01/2018   Smoker 12/30/2016   Syncope and collapse 05/16/2016   Thoracic outlet syndrome of left thoracic outlet 02/15/2018   Thoracic radiculopathy due to degenerative joint disease of spine 10/13/2016   CT and XR show at least 3 levels of DDD of thoracic spine  I suspect this is related to both the scoliosis and the history of dance   Urticaria    Past Surgical History: Past Surgical History:  Procedure Laterality Date   NO PAST SURGERIES     Medication List:  Current Outpatient Medications  Medication Sig Dispense Refill   albuterol (PROAIR HFA) 108 (90 Base) MCG/ACT inhaler Inhale 2 puffs into the lungs every 6 (six) hours as needed for wheezing or shortness of breath. 1 each 1   Albuterol-Budesonide (AIRSUPRA) 90-80 MCG/ACT AERO Inhale 2 puffs into the lungs every 4 (four) hours as needed (coughing, wheezing, chest tightness). Do not exceed 12 puffs in 24 hours. 10.7 g 2   aspirin EC 81 MG tablet Take 81 mg by mouth  daily. Swallow whole.     busPIRone (BUSPAR) 15 MG tablet Take 1 tablet (15 mg total) by mouth 2 (two) times daily. 180 tablet 0   cetirizine (ZYRTEC) 10 MG tablet Take 10 mg by mouth daily.     fluticasone furoate-vilanterol (BREO ELLIPTA) 100-25 MCG/ACT AEPB Inhale 1 puff into the lungs daily. Rinse mouth after each use. 30 each 5   lamoTRIgine (LAMICTAL) 100 MG tablet Take 1 tablet (100 mg total) by mouth daily. 90 tablet 0   meloxicam (MOBIC) 15 MG tablet TAKE 1 TABLET (15 MG TOTAL) BY MOUTH DAILY. 90 tablet 0   metFORMIN (GLUCOPHAGE) 500 MG tablet Take 1 tablet (500 mg total) by mouth 2 (two) times daily with a meal. W/ lunch and dinner 60 tablet 0   paragard intrauterine copper IUD IUD 1 each by Intrauterine route once.     Phentermine HCl (LOMAIRA) 8  MG TABS Take 1 tablet (8 mg total) by mouth daily. 30 tablet 0   sertraline (ZOLOFT) 50 MG tablet Take 1 tablet (50 mg total) by mouth daily. 90 tablet 0   topiramate (TOPAMAX) 50 MG tablet Take 1 tablet (50 mg total) by mouth daily. At lunch 30 tablet 0   Vitamin D, Ergocalciferol, (DRISDOL) 1.25 MG (50000 UNIT) CAPS capsule Take 1 capsule (50,000 Units total) by mouth every 7 (seven) days. 4 capsule 0   No current facility-administered medications for this visit.   Allergies: Allergies  Allergen Reactions   Bupropion Itching and Other (See Comments)   Social History: Social History   Socioeconomic History   Marital status: Married    Spouse name: Not on file   Number of children: Not on file   Years of education: Not on file   Highest education level: Bachelor's degree (e.g., BA, AB, BS)  Occupational History   Occupation: nanny   Occupation: Producer, television/film/video in Marketing & Outreach  Tobacco Use   Smoking status: Former    Current packs/day: 0.00    Types: Cigarettes    Quit date: 12/20/2015    Years since quitting: 7.4   Smokeless tobacco: Never  Vaping Use   Vaping status: Never Used  Substance and Sexual Activity   Alcohol  use: No    Alcohol/week: 0.0 standard drinks of alcohol   Drug use: No   Sexual activity: Not on file    Comment: previous IUD  Other Topics Concern   Not on file  Social History Narrative   5-10 hours of sleep per night   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)   Recovering from drug and alcohol addiction   Joined [employer]   Lives with her parents   2 dogs in the home      [place] 3 years  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       Right Handed    Lives in a one story home    Social Drivers of Health   Financial Resource Strain: Medium Risk (04/14/2023)   Overall Financial Resource Strain (CARDIA)    Difficulty of Paying Living Expenses: Somewhat hard  Food Insecurity: Food Insecurity Present (04/14/2023)   Hunger Vital Sign    Worried About Running Out of Food in the Last Year: Sometimes true    Ran Out of Food in the Last Year: Never true  Transportation Needs: No Transportation Needs (04/14/2023)   PRAPARE - Administrator, Civil Service (Medical): No    Lack of Transportation (Non-Medical): No  Physical Activity: Insufficiently Active (04/14/2023)   Exercise Vital Sign    Days of Exercise per Week: 1 day    Minutes of Exercise per Session: 30 min  Stress: No Stress Concern Present (04/14/2023)   [employer] of Occupational Health - Occupational Stress Questionnaire    Feeling of Stress : Only a little  Social Connections: Unknown (04/14/2023)   Social Connection and Isolation Panel [NHANES]    Frequency of Communication with Friends and Family: More than three times a week    Frequency of Social Gatherings with Friends and Family: Three times a week    Attends Religious Services: Never    Active Member of Clubs or Organizations: Patient declined    Attends Banker Meetings: Not on file    Marital Status: Married   Lives in a house. Smoking: for a few yrs Occupation: Sales promotion account executive History: [employer]  Damage/mildew in the house: yes Carpet in the family room: no Carpet in the bedroom: no Heating: electric Cooling: central Pet: yes 1 dog x 6+ months  Family History: Family History  Problem Relation Age of Onset   Asthma Mother    Hyperlipidemia Mother    Mental illness Mother    Depression Mother    Anxiety disorder Mother    Bipolar disorder Mother    Anxiety disorder Father    Depression Father    Hypertension Father    Irritable bowel syndrome Father    Sleep apnea Father    Obesity Father    Allergic rhinitis Sister    Colon cancer Maternal Grandfather    Eczema Daughter    Review of Systems  Constitutional:  Negative for appetite change, chills, fever and unexpected weight change.  HENT:  Negative for congestion and rhinorrhea.   Eyes:  Negative for itching.       Dry eyes  Respiratory:  Negative for cough, chest tightness, shortness of breath and wheezing.   Cardiovascular:  Negative for chest pain.  Gastrointestinal:  Negative for abdominal pain.  Genitourinary:  Negative for difficulty urinating.  Skin:  Negative for rash.  Neurological:  Negative for headaches.    Objective: BP 124/76 (BP Location: Left Arm, Patient Position: Sitting, Cuff Size: Normal)   Pulse 100   Temp 98 F (36.7 C) (Temporal)   Resp 18   Ht 5\' 4"  (1.626 m)   Wt 202 lb 12.8 oz (92 kg)   SpO2 98%   BMI 34.81 kg/m  Body mass index is 34.81 kg/m. Physical Exam Vitals and nursing note reviewed.  Constitutional:      Appearance: Normal appearance. She is well-developed.  HENT:     Head: Normocephalic and atraumatic.     Right Ear: Tympanic membrane and external ear normal.     Left Ear: Tympanic membrane and external ear normal.     Nose: Nose normal.     Mouth/Throat:     Mouth: Mucous membranes are moist.     Pharynx: Oropharynx is clear.  Eyes:     Conjunctiva/sclera: Conjunctivae normal.  Cardiovascular:     Rate and Rhythm: Normal rate and regular rhythm.     Heart  sounds: Normal heart sounds. No murmur heard.    No friction rub. No gallop.  Pulmonary:     Effort: Pulmonary effort is normal.     Breath sounds: Normal breath sounds. No wheezing, rhonchi or rales.  Musculoskeletal:     Cervical back: Neck supple.  Skin:    General: Skin is warm.     Findings: No rash.  Neurological:     Mental Status: She is alert and oriented to person, place, and time.  Psychiatric:        Behavior: Behavior normal.   The plan was reviewed with the patient/family, and all questions/concerned were addressed.  It was my pleasure to see Monaca today and participate in her care. Please feel free to contact me with any questions or concerns.  Sincerely,  Wyline Mood, DO Allergy & Immunology  [facility] office: [phone] [facility] office: [phone]

## 2023-05-25 ENCOUNTER — Encounter (INDEPENDENT_AMBULATORY_CARE_PROVIDER_SITE_OTHER): Payer: Self-pay | Admitting: Family Medicine

## 2023-05-25 ENCOUNTER — Ambulatory Visit (INDEPENDENT_AMBULATORY_CARE_PROVIDER_SITE_OTHER): Payer: BC Managed Care – PPO | Admitting: Family Medicine

## 2023-05-25 VITALS — BP 121/81 | HR 70 | Temp 98.7°F | Ht 64.0 in | Wt 196.0 lb

## 2023-05-25 DIAGNOSIS — Z6833 Body mass index (BMI) 33.0-33.9, adult: Secondary | ICD-10-CM

## 2023-05-25 DIAGNOSIS — F5089 Other specified eating disorder: Secondary | ICD-10-CM | POA: Diagnosis not present

## 2023-05-25 DIAGNOSIS — E66812 Obesity, class 2: Secondary | ICD-10-CM

## 2023-05-25 DIAGNOSIS — E559 Vitamin D deficiency, unspecified: Secondary | ICD-10-CM

## 2023-05-25 DIAGNOSIS — R7303 Prediabetes: Secondary | ICD-10-CM

## 2023-05-25 DIAGNOSIS — E669 Obesity, unspecified: Secondary | ICD-10-CM

## 2023-05-25 DIAGNOSIS — E7849 Other hyperlipidemia: Secondary | ICD-10-CM

## 2023-05-25 MED ORDER — TOPIRAMATE 50 MG PO TABS: 50.0000 mg | ORAL_TABLET | Freq: Every day | ORAL | 0 refills | Status: DC

## 2023-05-25 MED ORDER — LOMAIRA 8 MG PO TABS: 8.0000 mg | ORAL_TABLET | Freq: Every day | ORAL | 0 refills | Status: DC

## 2023-05-25 MED ORDER — VITAMIN D (ERGOCALCIFEROL) 1.25 MG (50000 UNIT) PO CAPS: 50000.0000 [IU] | ORAL_CAPSULE | ORAL | 0 refills | Status: DC

## 2023-05-25 MED ORDER — METFORMIN HCL 500 MG PO TABS: 500.0000 mg | ORAL_TABLET | Freq: Two times a day (BID) | ORAL | 0 refills | Status: DC

## 2023-05-25 NOTE — Progress Notes (Signed)
 Sheri [name], D.O.  ABFM, ABOM Specializing in Clinical Bariatric Medicine  Office located at: [address] [place], [place]  [address]   Assessment and Plan:   FOR THE DISEASE OF OBESITY: Obesity with starting BMI of 36.8 Assessment & Plan: Since last office visit on 04/27/23 patient's muscle mass has decreased by 0.2 lb. Fat mass has decreased by 1.2 lb. Total body water has decreased by 0.6 lb.  Counseling done on how various foods will affect these numbers and how to maximize success  Total lbs lost to date: 18 lbs Total weight loss percentage to date: -8.41%    Recommended Dietary Goals Sheri [name] is currently in the action stage of change. As such, her goal is to continue weight management plan.  She has agreed to: continue current plan   Behavioral Intervention We discussed the following today: increasing lean protein intake to established goals, decreasing simple carbohydrates , avoiding skipping meals, increasing water intake , keeping healthy foods at home, and avoiding temptations and identifying enticing environmental cues  Additional resources provided today: None  Evidence-based interventions for health behavior change were utilized today including the discussion of self monitoring techniques, problem-solving barriers and SMART goal setting techniques.   Regarding patient's less desirable eating habits and patterns, we employed the technique of small changes.   Pt will specifically work on: Focus on increasing her protien intake for next visit.    Recommended Physical Activity Goals Sheri [name] has been advised to work up to 150 minutes of moderate intensity aerobic activity a week and strengthening exercises 2-3 times per week for cardiovascular health, weight loss maintenance and preservation of muscle mass.   She has agreed to :  Think about enjoyable ways to increase daily physical activity and overcoming barriers to exercise   Pharmacotherapy We  discussed various medication options to help Sheri [name] with her weight loss efforts and we both agreed to: continue with nutritional and behavioral strategies   FOR ASSOCIATED CONDITIONS ADDRESSED TODAY: Prediabetes Assessment & Plan: Lab Results  Component Value Date   HGBA1C 5.7 (H) 09/15/2022   HGBA1C 6.3 01/06/2022   HGBA1C 5.8 06/12/2020   INSULIN  20.1 09/15/2022    Last A1c was 5.7 in 08/2022. She is compliant to Metformin  500 mg BID with lunch. Tolerating well, no SE. Still having some cravings for sweets and had cake on her birthday, though she made sure to limit herself.   Discussed the option of starting GLP-1 medication. Pt not interested in starting injectables. Encouraged to prioritize her protein intake via her meal plan in an effort to improve her condition and overall health. Will continue to monitor condition.   Orders: - Refill Metformin     Other disorder of eating - emotional eating  Assessment & Plan: Mood is stable. Denies any SI/HI. Pt is compliant with Lomaira  8 mg daily and Topiramate  50 mg once daily. Tolerating both medications well, no reported side effects. As advised at her last OV, she has been taking Topiramate  at lunch for better effectiveness in the evenings and nighttime. Pt follows up with a behavioral health specialist.   Continue with current regimen as directed. Pt was informed of our Bariatric Psychologist, Dr. Sharron, and would like to be referred today. Will continue to monitor her condition as it relates to weight loss.   Orders: - Refill Topiramate  today, no dose changes.  - Refill Lomaira , no changes   Other hyperlipidemia Assessment & Plan: Lab Results  Component Value Date   CHOL  191 09/15/2022   HDL 45 09/15/2022   LDLCALC 123 (H) 09/15/2022   LDLDIRECT 150.0 02/13/2015   TRIG 127 09/15/2022   CHOLHDL 4 06/12/2020   Not on meds currently. Diet/exercise approach. Last lipid panel from 08/2022 show an elevated LDL of 123. Reports  some off plan foods. Follow heart healthy diet via her meal plan. Decrease simple carb/sugar intake. Avoid fatty meats and opt for lean proteins. Continue to monitor her condition.   Orders: - Recheck CBC   Vitamin D  deficiency Assessment & Plan: Lab Results  Component Value Date   VD25OH 32.6 09/15/2022   Last Vit D was sub-optimal at 32.6 as of 08/2022. Pt is compliant with ERGO 50K units once weekly with no intolerances or side effects. Taking ERGO on Mondays. Continue with current supplementation regimen. No changes made today. Continue to monitor condition.   Orders: - Refill ERGO today, no dose changes    Follow up:   Return in about 3 weeks (around 06/15/2023). She was informed of the importance of frequent follow up visits to maximize her success with intensive lifestyle modifications for her multiple health conditions.  Subjective:   Chief complaint: Obesity Sheri [name] is here to discuss her progress with her obesity treatment plan. She is on the the Category 3 Plan and states she is following her eating plan approximately 60% of the time. She states she is walking and doing yoga 30-60 minutes 3 days per week.  Interval History:  Sheri [name] is here for a follow up office visit. Since last OV, she is down 1 lb. She found it helpful to increase her mindfulness and awareness to areas of improvements. Has been eating lean red meat and steaks. Still craves sweets and had cake for her birthday. She does, however, note that she limited the amount of birthday cake she ate.   Barriers identified: multiple competing priorities, having difficulty focusing on healthy eating, and celebration eating .   Pharmacotherapy for weight loss: She is currently taking Metformin  (off label use for incretin effect and / or insulin  resistance and / or diabetes prevention) with adequate clinical response  and without side effects., Topiramate  (off label use, single agent) with adequate clinical  response  and without side effects., and Phentermine  (longterm use, single agent)  with adequate clinical response  and without side effects..   Review of Systems:  Pertinent positives were addressed with patient today.  Reviewed by clinician on day of visit: allergies, medications, problem list, medical history, surgical history, family history, social history, and previous encounter notes.  Weight Summary and Biometrics   Weight Lost Since Last Visit: 1lb  Weight Gained Since Last Visit: 0   Vitals Temp: 98.7 F (37.1 C) BP: 121/81 Pulse Rate: 70 SpO2: 100 %   Anthropometric Measurements Height: 5' 4 (1.626 m) Weight: 196 lb (88.9 kg) BMI (Calculated): 33.63 Weight at Last Visit: 197lb Weight Lost Since Last Visit: 1lb Weight Gained Since Last Visit: 0 Starting Weight: 214lb Total Weight Loss (lbs): 16 lb (7.258 kg) Peak Weight: 225lb   Body Composition  Body Fat %: 40.1 % Fat Mass (lbs): 78.6 lbs Muscle Mass (lbs): 111.6 lbs Total Body Water (lbs): 77.6 lbs Visceral Fat Rating : 8   Other Clinical Data Fasting: yes Labs: yes Today's Visit #: 11 Starting Date: 09/15/22    Objective:   PHYSICAL EXAM: Blood pressure 121/81, pulse 70, temperature 98.7 F (37.1 C), height 5' 4 (1.626 m), weight 196 lb (88.9 kg), SpO2 100%.  Body mass index is 33.64 kg/m.  General: she is overweight, cooperative and in no acute distress. PSYCH: Has normal mood, affect and thought process.   HEENT: EOMI, sclerae are anicteric. Lungs: Normal breathing effort, no conversational dyspnea. Extremities: Moves * 4 Neurologic: A and O * 3, good insight  DIAGNOSTIC DATA REVIEWED: BMET    Component Value Date/Time   NA 139 09/15/2022 0939   K 4.3 09/15/2022 0939   CL 102 09/15/2022 0939   CO2 22 09/15/2022 0939   GLUCOSE 81 09/15/2022 0939   GLUCOSE 73 01/06/2022 1518   BUN 9 09/15/2022 0939   CREATININE 0.71 09/15/2022 0939   CALCIUM 9.1 09/15/2022 0939   GFRNONAA >60  05/17/2016 0453   GFRAA >60 05/17/2016 0453   Lab Results  Component Value Date   HGBA1C 5.7 (H) 09/15/2022   HGBA1C 5.5 02/13/2015   Lab Results  Component Value Date   INSULIN  20.1 09/15/2022   Lab Results  Component Value Date   TSH 2.400 09/15/2022   CBC    Component Value Date/Time   WBC 7.7 09/15/2022 0939   WBC 9.5 01/06/2022 1518   RBC 4.52 09/15/2022 0939   RBC 4.61 01/06/2022 1518   HGB 12.9 09/15/2022 0939   HCT 39.2 09/15/2022 0939   PLT 334 09/15/2022 0939   MCV 87 09/15/2022 0939   MCH 28.5 09/15/2022 0939   MCH 29.6 12/12/2019 0600   MCHC 32.9 09/15/2022 0939   MCHC 33.2 01/06/2022 1518   RDW 13.2 09/15/2022 0939   Iron  Studies    Component Value Date/Time   IRON  54 06/12/2020 1045   FERRITIN 12.1 06/12/2020 1045   IRONPCTSAT 11.7 (L) 06/12/2020 1045   Lipid Panel     Component Value Date/Time   CHOL 191 09/15/2022 0939   TRIG 127 09/15/2022 0939   HDL 45 09/15/2022 0939   CHOLHDL 4 06/12/2020 1045   VLDL 27.8 06/12/2020 1045   LDLCALC 123 (H) 09/15/2022 0939   LDLDIRECT 150.0 02/13/2015 1449   Hepatic Function Panel     Component Value Date/Time   PROT 7.2 09/15/2022 0939   ALBUMIN 4.4 09/15/2022 0939   AST 11 09/15/2022 0939   ALT 15 09/15/2022 0939   ALKPHOS 61 09/15/2022 0939   BILITOT 0.2 09/15/2022 0939   BILIDIR 0.1 01/06/2022 1518      Component Value Date/Time   TSH 2.400 09/15/2022 9060   Nutritional Lab Results  Component Value Date   VD25OH 32.6 09/15/2022    Attestations:   I, Vernell Forest, acting as a stage manager for Sheri Jenkins, DO., have compiled all relevant documentation for today's office visit on behalf of Sheri Jenkins, DO, while in the presence of [employer], DO.  Reviewed by clinician on day of visit: allergies, medications, problem list, medical history, surgical history, family history, social history, and previous encounter notes pertinent to patient's obesity diagnosis.  I have  reviewed the above documentation for accuracy and completeness, and I agree with the above. Sheri JINNY [name], D.O.  The 21st Century Cures Act was signed into law in 2016 which includes the topic of electronic health records.  This provides immediate access to information in MyChart.  This includes consultation notes, operative notes, office notes, lab results and pathology reports.  If you have any questions about what you read please let us  know at your next visit so we can discuss your concerns and take corrective action if need be.  We are right here with you.

## 2023-05-26 ENCOUNTER — Other Ambulatory Visit (HOSPITAL_COMMUNITY): Payer: Self-pay

## 2023-05-26 LAB — COMPREHENSIVE METABOLIC PANEL
ALT: 14 [IU]/L (ref 0–32)
AST: 12 [IU]/L (ref 0–40)
Albumin: 4.5 g/dL (ref 3.9–4.9)
Alkaline Phosphatase: 68 [IU]/L (ref 44–121)
BUN/Creatinine Ratio: 15 (ref 9–23)
BUN: 13 mg/dL (ref 6–20)
Bilirubin Total: 0.4 mg/dL (ref 0.0–1.2)
CO2: 21 mmol/L (ref 20–29)
Calcium: 9.6 mg/dL (ref 8.7–10.2)
Chloride: 102 mmol/L (ref 96–106)
Creatinine, Ser: 0.85 mg/dL (ref 0.57–1.00)
Globulin, Total: 3.3 g/dL (ref 1.5–4.5)
Glucose: 84 mg/dL (ref 70–99)
Potassium: 4.2 mmol/L (ref 3.5–5.2)
Sodium: 139 mmol/L (ref 134–144)
Total Protein: 7.8 g/dL (ref 6.0–8.5)
eGFR: 92 mL/min/{1.73_m2} (ref >59)

## 2023-05-26 LAB — VITAMIN D 25 HYDROXY (VIT D DEFICIENCY, FRACTURES): Vit D, 25-Hydroxy: 56.9 ng/mL (ref 30.0–100.0)

## 2023-05-26 LAB — HEMOGLOBIN A1C
Est. average glucose Bld gHb Est-mCnc: 114 mg/dL
Hgb A1c MFr Bld: 5.6 % (ref 4.8–5.6)

## 2023-05-26 LAB — INSULIN, RANDOM: INSULIN: 15.9 u[IU]/mL (ref 2.6–24.9)

## 2023-05-26 LAB — LIPID PANEL
Chol/HDL Ratio: 4.7 {ratio} — ABNORMAL HIGH (ref 0.0–4.4)
Cholesterol, Total: 237 mg/dL — ABNORMAL HIGH (ref 100–199)
HDL: 50 mg/dL (ref >39)
LDL Chol Calc (NIH): 160 mg/dL — ABNORMAL HIGH (ref 0–99)
Triglycerides: 151 mg/dL — ABNORMAL HIGH (ref 0–149)
VLDL Cholesterol Cal: 27 mg/dL (ref 5–40)

## 2023-05-26 LAB — VITAMIN B12: Vitamin B-12: 420 pg/mL (ref 232–1245)

## 2023-05-26 NOTE — Telephone Encounter (Signed)
 Pharmacy Patient Advocate Encounter  Received notification from [facility] that Prior Authorization for Airsupra  90-80MCG/ACT aerosol  has been APPROVED from 05/24/2023 to 05/20/2024. Ran test claim, Copay is $35.00. This test claim was processed through [facility]- copay amounts may vary at other pharmacies due to pharmacy/plan contracts, or as the patient moves through the different stages of their insurance plan.

## 2023-05-31 NOTE — Progress Notes (Signed)
[name] [address] [place] [address] Phone: [phone] Subjective:   Bruce Donath, am serving as a scribe for Dr. Antoine Primas.  I'm seeing this patient by the request  of:  Panosh, Neta Mends, MD  CC: Hip and shoulder pain  [id number]  Sheri [name] is a 33 y.o. female coming in with complaint of back and neck pain. OMT 02/11/2023. Patient states that she had some stiffness in L hip but she worked it out somewhat.   Medications patient has been prescribed: Meloxicam  Taking:  Since we have seen patient has been seen by allergy and asthma and started on a new inhaler.  Patient is also being seen by a healthy weight and wellness       Reviewed prior external information including notes and imaging from previsou exam, outside providers and external EMR if available.   As well as notes that were available from care everywhere and other healthcare systems.  Past medical history, social, surgical and family history all reviewed in electronic medical record.  No pertanent information unless stated regarding to the chief complaint.   Past Medical History:  Diagnosis Date   Alcohol addiction (HCC)    Anemia    Anxiety    Back pain    Bipolar depression (HCC)    under psych rx    Carpal tunnel syndrome    Chest pain    Depression    Drug addiction in remission [facility])    heroin   Enteritis due to Norovirus 05/18/2016   Heartburn    Hx of pulmonary embolus 02/13/2015   Hyperlipidemia    Hypokalemia 05/17/2016   IUD (intrauterine device) in place 02/13/2015   Joint pain    Mild mitral regurgitation    Mild tricuspid regurgitation    Mitral regurgitation 06/13/2018   Near syncope 05/16/2016   a. felt due to norovirus.   Nonallopathic lesion of cervical region 09/01/2018   Nonallopathic lesion of rib cage 09/01/2018   Obesity    Orthostatic hypotension 05/18/2016   Palpitations    PCOS (polycystic ovarian syndrome)  02/13/2015   Prediabetes    Pulmonary embolism (HCC) 2011   neg heme evaluation  felt to be from ocps    Recurrent upper respiratory infection (URI)    Scapular dyskinesis 09/01/2018   Smoker 12/30/2016   Syncope and collapse 05/16/2016   Thoracic outlet syndrome of left thoracic outlet 02/15/2018   Thoracic radiculopathy due to degenerative joint disease of spine 10/13/2016   CT and XR show at least 3 levels of DDD of thoracic spine  I suspect this is related to both the scoliosis and the history of dance   Urticaria     Allergies  Allergen Reactions   Bupropion Itching and Other (See Comments)     Review of Systems:  No headache, visual changes, nausea, vomiting, diarrhea, constipation, dizziness, abdominal pain, skin rash, fevers, chills, night sweats, weight loss, swollen lymph nodes, body aches, joint swelling, chest pain, shortness of breath, mood changes. POSITIVE muscle aches  Objective  Blood pressure 110/82, pulse 65, height 5\' 4"  (1.626 m), weight 203 lb (92.1 kg), SpO2 98%.   General: No apparent distress alert and oriented x3 mood and affect normal, dressed appropriately.  HEENT: Pupils equal, extraocular movements intact  Respiratory: Patient's speak in full sentences and does not appear short of breath  Cardiovascular: No lower extremity edema, non tender, no erythema  Gait MSK:  Back does have  some loss lordosis noted.  Some tightness with FABER.  Seems to be more on the left than the right.  Osteopathic findings  C7 flexed rotated and side bent left T3 extended rotated and side bent right inhaled rib T6 extended rotated and side bent left L2 flexed rotated and side bent right L5 flexed rotated and side bent left Sacrum left on left       Assessment and Plan:  Thoracic radiculopathy due to degenerative joint disease of spine 1.  Some mild arthritic changes but has responded well to osteopathic manipulation.  Discussed icing regimen and home exercises.   Continue to work on weight loss and core strength with patient is doing well with her other specialist.  Patient does respond well to osteopathic manipulation.  Follow-up again in 6 to 8 weeks.    Nonallopathic problems  Decision today to treat with OMT was based on Physical Exam  After verbal consent patient was treated with HVLA, ME, FPR techniques in cervical, rib, thoracic, lumbar, and sacral  areas  Patient tolerated the procedure well with improvement in symptoms  Patient given exercises, stretches and lifestyle modifications  See medications in patient instructions if given  Patient will follow up in 4-8 weeks     The above documentation has been reviewed and is accurate and complete Judi Saa, DO         Note: This dictation was prepared with Dragon dictation along with smaller phrase technology. Any transcriptional errors that result from this process are unintentional.

## 2023-06-01 ENCOUNTER — Ambulatory Visit: Payer: BC Managed Care – PPO | Admitting: Family Medicine

## 2023-06-01 ENCOUNTER — Encounter: Payer: Self-pay | Admitting: Family Medicine

## 2023-06-01 VITALS — BP 110/82 | HR 65 | Ht 64.0 in | Wt 203.0 lb

## 2023-06-01 DIAGNOSIS — M9904 Segmental and somatic dysfunction of sacral region: Secondary | ICD-10-CM | POA: Diagnosis not present

## 2023-06-01 DIAGNOSIS — M9908 Segmental and somatic dysfunction of rib cage: Secondary | ICD-10-CM

## 2023-06-01 DIAGNOSIS — M4724 Other spondylosis with radiculopathy, thoracic region: Secondary | ICD-10-CM | POA: Diagnosis not present

## 2023-06-01 DIAGNOSIS — M9903 Segmental and somatic dysfunction of lumbar region: Secondary | ICD-10-CM

## 2023-06-01 DIAGNOSIS — M9902 Segmental and somatic dysfunction of thoracic region: Secondary | ICD-10-CM

## 2023-06-01 DIAGNOSIS — M9901 Segmental and somatic dysfunction of cervical region: Secondary | ICD-10-CM

## 2023-06-01 NOTE — Assessment & Plan Note (Signed)
1.  Some mild arthritic changes but has responded well to osteopathic manipulation.  Discussed icing regimen and home exercises.  Continue to work on weight loss and core strength with patient is doing well with her other specialist.  Patient does respond well to osteopathic manipulation.  Follow-up again in 6 to 8 weeks.

## 2023-06-01 NOTE — Patient Instructions (Signed)
Hope you like Feb 15th flowers See me again in 6-8 weeks

## 2023-06-02 ENCOUNTER — Other Ambulatory Visit (INDEPENDENT_AMBULATORY_CARE_PROVIDER_SITE_OTHER): Payer: Self-pay | Admitting: Family Medicine

## 2023-06-02 DIAGNOSIS — E559 Vitamin D deficiency, unspecified: Secondary | ICD-10-CM

## 2023-06-10 ENCOUNTER — Ambulatory Visit: Payer: Self-pay | Admitting: Family Medicine

## 2023-06-10 ENCOUNTER — Ambulatory Visit: Payer: Self-pay | Admitting: Internal Medicine

## 2023-06-10 MED ORDER — ONDANSETRON 4 MG PO TBDP: 4.0000 mg | ORAL_TABLET | Freq: Three times a day (TID) | ORAL | 0 refills | Status: DC | PRN

## 2023-06-10 NOTE — Telephone Encounter (Signed)
 Relay message to provider. Provider ok to send in Rx. Rx was pended by Dr. Fabian Sharp.    Contacted pt. Pt request for it to send to [facility] [address].   Rx sent. Pt is aware.

## 2023-06-10 NOTE — Telephone Encounter (Signed)
 Copied from CRM [id number]. Topic: Clinical - Red Word Triage >> Jun 10, 2023 10:05 AM Pascal Lux wrote: Red Word that prompted transfer to Nurse Triage: Patient went to urgent care and got diagnosed with the flu. Feeling nausea.  Chief Complaint: N/V recently diagnosed with the flu Symptoms: n/v, flu like symptoms Frequency: started last night Pertinent Negatives: Patient denies weakness, cp, sob Disposition: [] ED /[] Urgent Care (no appt availability in office) / [] Appointment(In office/virtual)/ []  [facility]/ [x] Home Care/ [] Refused Recommended Disposition /[] [facility]/ []  Follow-up with PCP Additional Notes: recently diagnosed with the flu and is now having n/v.  States would like something called in for the nausea.  Care advice given instructed to go to the er if becomes worse.  Office updated.   Reason for Disposition  Unexplained nausea  Answer Assessment - Initial Assessment Questions 1. NAUSEA SEVERITY: "How bad is the nausea?" (e.g., mild, moderate, severe; dehydration, weight loss)   - MILD: loss of appetite without change in eating habits   - MODERATE: decreased oral intake without significant weight loss, dehydration, or malnutrition   - SEVERE: inadequate caloric or fluid intake, significant weight loss, symptoms of dehydration     Started last night, throwing up "a handful" 2. ONSET: "When did the nausea begin?"     Last night 3. VOMITING: "Any vomiting?" If Yes, ask: "How many times today?"     Yes, "a hand full of times" 4. RECURRENT SYMPTOM: "Have you had nausea before?" If Yes, ask: "When was the last time?" "What happened that time?"     Diagnosed with flu 5. CAUSE: "What do you think is causing the nausea?"     flu 6. PREGNANCY: "Is there any chance you are pregnant?" (e.g., unprotected intercourse, missed birth control pill, broken condom)     Na.  Protocols used: Nausea-A-AH

## 2023-06-17 ENCOUNTER — Telehealth: Payer: BC Managed Care – PPO | Admitting: Physician Assistant

## 2023-06-17 DIAGNOSIS — B9689 Other specified bacterial agents as the cause of diseases classified elsewhere: Secondary | ICD-10-CM | POA: Diagnosis not present

## 2023-06-17 DIAGNOSIS — J019 Acute sinusitis, unspecified: Secondary | ICD-10-CM

## 2023-06-17 MED ORDER — AMOXICILLIN-POT CLAVULANATE 875-125 MG PO TABS: 1.0000 | ORAL_TABLET | Freq: Two times a day (BID) | ORAL | 0 refills | Status: DC

## 2023-06-17 NOTE — Progress Notes (Signed)

## 2023-06-17 NOTE — Progress Notes (Signed)
 I have spent 5 minutes in review of e-visit questionnaire, review and updating patient chart, medical decision making and response to patient.   Piedad Climes, PA-C

## 2023-06-21 ENCOUNTER — Encounter (INDEPENDENT_AMBULATORY_CARE_PROVIDER_SITE_OTHER): Payer: Self-pay | Admitting: Family Medicine

## 2023-06-21 ENCOUNTER — Ambulatory Visit (INDEPENDENT_AMBULATORY_CARE_PROVIDER_SITE_OTHER): Payer: BC Managed Care – PPO | Admitting: Family Medicine

## 2023-06-21 VITALS — BP 116/75 | HR 85 | Temp 98.3°F | Ht 64.0 in | Wt 195.0 lb

## 2023-06-21 DIAGNOSIS — E669 Obesity, unspecified: Secondary | ICD-10-CM

## 2023-06-21 DIAGNOSIS — E7849 Other hyperlipidemia: Secondary | ICD-10-CM

## 2023-06-21 DIAGNOSIS — E559 Vitamin D deficiency, unspecified: Secondary | ICD-10-CM | POA: Diagnosis not present

## 2023-06-21 DIAGNOSIS — E538 Deficiency of other specified B group vitamins: Secondary | ICD-10-CM

## 2023-06-21 DIAGNOSIS — F5089 Other specified eating disorder: Secondary | ICD-10-CM

## 2023-06-21 DIAGNOSIS — Z6833 Body mass index (BMI) 33.0-33.9, adult: Secondary | ICD-10-CM

## 2023-06-21 DIAGNOSIS — Z515 Encounter for palliative care: Secondary | ICD-10-CM | POA: Insufficient documentation

## 2023-06-21 DIAGNOSIS — R7303 Prediabetes: Secondary | ICD-10-CM | POA: Diagnosis not present

## 2023-06-21 MED ORDER — VITAMIN D (ERGOCALCIFEROL) 1.25 MG (50000 UNIT) PO CAPS: 50000.0000 [IU] | ORAL_CAPSULE | ORAL | 0 refills | Status: DC

## 2023-06-21 MED ORDER — LOMAIRA 8 MG PO TABS: 8.0000 mg | ORAL_TABLET | Freq: Every day | ORAL | 0 refills | Status: DC

## 2023-06-21 MED ORDER — METFORMIN HCL 500 MG PO TABS: 500.0000 mg | ORAL_TABLET | Freq: Two times a day (BID) | ORAL | 0 refills | Status: DC

## 2023-06-21 MED ORDER — CYANOCOBALAMIN 500 MCG PO TABS: 500.0000 ug | ORAL_TABLET | Freq: Every day | ORAL | Status: DC

## 2023-06-21 NOTE — Progress Notes (Signed)
 [name], D.O.  ABFM, ABOM Specializing in Clinical Bariatric Medicine  Office located at: [address] [place], [place]  [address]   Assessment and Plan:   FOR THE DISEASE OF OBESITY: BMI 33.0-33.9,adult - Current BMI 33.46 Obesity with starting BMI of 36.8 Assessment & Plan: Since last office visit on 05/25/23 patient's muscle mass has decreased by 0.8 lb. Fat mass has no change. Total body water has increased by 1.2 lb.  Counseling done on how various foods will affect these numbers and how to maximize success  Total lbs lost to date: 19 lbs Total weight loss percentage to date: -8.88%    Recommended Dietary Goals Sheri [name] is currently in the action stage of change. As such, her goal is to continue weight management plan. Discussed needing to be in a 400-500 calorie deficit daily to lose 1 lb of fat per week.   She has agreed to: switch to Category 2 MP with B/L options and journaling adhering to 1200-1400 calories daily and 90+++ g of protein daily   Behavioral Intervention We discussed the following today: increasing lean protein intake to established goals, decreasing simple carbohydrates , avoiding skipping meals, increasing water intake , decreasing eating out or consumption of processed foods, and making healthy choices when eating convenient foods, and continue to practice mindfulness when eating  Additional resources provided today: handout on CAT 2 meal plan, Handout on CAT 1-2 breakfast options, and Handout on CAT 1-2 lunch options  Evidence-based interventions for health behavior change were utilized today including the discussion of self monitoring techniques, problem-solving barriers and SMART goal setting techniques.   Regarding patient's less desirable eating habits and patterns, we employed the technique of small changes.   Pt will specifically work on: implementation of new meal plan and increasing protein intake, especially at lunch for next  visit.    Recommended Physical Activity Goals Sheri [name] has been advised to work up to 150 minutes of moderate intensity aerobic activity a week and strengthening exercises 2-3 times per week for cardiovascular health, weight loss maintenance and preservation of muscle mass.   She has agreed to :  Continue current level of physical activity , Think about enjoyable ways to increase daily physical activity and overcoming barriers to exercise, and Increase physical activity in their day and reduce sedentary time (increase NEAT).   Pharmacotherapy We both agreed to : continue with nutritional and behavioral strategies and adequate clinical response to current dose, continue current regimen   FOR ASSOCIATED CONDITIONS ADDRESSED TODAY: Other hyperlipidemia Assessment & Plan: Lab Results  Component Value Date   CHOL 237 (H) 05/25/2023   HDL 50 05/25/2023   LDLCALC 160 (H) 05/25/2023   LDLDIRECT 150.0 02/13/2015   TRIG 151 (H) 05/25/2023   CHOLHDL 4.7 (H) 05/25/2023  No meds currently. Diet/exercise approach. LDL and Triglycerides were above goal at 160 and 151 respectively about  3 weeks ago. LDL worsened from prior labs of 123 to 160 most recently She also reports a family history of CAD.   Recommended pt to follow up with Dr. Fabian Sharp about worsening LDL and triglycerides. Reviewed ideal goal of LDL and TGs. Encouraged to to follow a heart healthy diet via her nutritional meal plan and increase exercise to help improve her condition. Will continue to monitor condition alongside PCP/specialists as it relates to her weight loss journey.    Prediabetes Assessment & Plan: Lab Results  Component Value Date   HGBA1C 5.6 05/25/2023   HGBA1C 5.7 (  H) 09/15/2022   HGBA1C 6.3 01/06/2022   INSULIN 15.9 05/25/2023   INSULIN 20.1 09/15/2022  A year ago her A1C was 6.3 in 12/2021. Most recent A1C is 5.6 on 05/24/22. Pt is currently on Metformin 500 mg BID with lunch and dinner. Good compliance and  tolerance reported. She is eating on plan overall but struggles with lunch given she has to eat out for work at times.   Discussed future options of increasing Metformin and switching to a once daily dose of Metformin to avoid having to take BID (with lunch). For now, pt will continue with her current medication regimen as prescribed with no changes made today. Encouraged pt to increase her lean protein intake, especially at lunch time. Will continue to monitor condition.   Orders: - Refill Metformin, no dose changes   Other disorder of eating- emotional eating Assessment & Plan: Moods stable currently. Pt is managing condition with Phentermine. Good compliance and tolerance with no SE reported. Less hunger/cravings when she tried to focus more on protein intake. She ate more protein when she wasn't thinking about food that much.   Will continue with current medication regimen as prescribed. Reviewed how increasing her protein intake will help with control of hunger/cravings. Advised pt to continue prioritizing her lean protein intake, especially at lunch. Will continue to monitor condition.   Orders: - Refill Phentermine, no dose changes   Vitamin D deficiency Assessment & Plan: Lab Results  Component Value Date   VD25OH 56.9 05/25/2023   VD25OH 32.6 09/15/2022  Last Vit D was at goal. Pt is taking ERGO 50K units once daily, tolerating well with no SE. Good compliance reported. No acute concerns in this regard.   Continue with current supplementation as prescribed. Will refill ERGO, no changes made today. Will continue to monitor condition as it relates to her weight loss journey.   Orders: - Refill ERGO, no dose changes    B12 deficiency Assessment & Plan: Lab Results  Component Value Date   VITAMINB12 420 05/25/2023  Most recent B12 was below goal at 420. Pt not currently on any supplementations.   Ideal B12 levels of 500 or more reviewed with pt. Discussed the benefits of  optimal levels of B12 such as improved energy, moods, brain function, and overall health. Mutually agreed to start pt on OTC B12 supplementation today. Will continue to monitor condition.   Orders: - Start OTC B12 500 mcg    Follow up:   Return in about 4 weeks (around 07/19/2023) for f/u around 4 week . She was informed of the importance of frequent follow up visits to maximize her success with intensive lifestyle modifications for her multiple health conditions.  Subjective:   Chief complaint: Obesity Sheri [name] is here to discuss her progress with her obesity treatment plan. She is on the Category 3 Plan and states she is following her eating plan approximately 70% of the time. She states she is doing yoga 60 minutes 2 days per week.   Interval History:  Sheri [name] is here for a follow up office visit. Since last OV on 05/25/23, she is down 1 lb. She has been better at following her meal plan but is not weighing her food for lunch. Her lunch tends to vary due to having to go on lunch meetings for work, and is unable to weight proteins when having to eat out. She does, however, try to stick to balanced plate proteins with vegetables and lean protein. She is eating  what is brought in from her workplace about 1-2 days a week for lunch.   Pharmacotherapy for weight loss: She is currently taking Metformin (off label use for incretin effect and / or insulin resistance and / or diabetes prevention) with adequate clinical response  and without side effects., Topiramate (off label use, single agent) with adequate clinical response  and without side effects., and Phentermine (longterm use, single agent)  with adequate clinical response  and without side effects..   Review of Systems:  Pertinent positives were addressed with patient today.  Reviewed by clinician on day of visit: allergies, medications, problem list, medical history, surgical history, family history, social history, and previous  encounter notes.  Weight Summary and Biometrics   Weight Lost Since Last Visit: 1 lb  Weight Gained Since Last Visit: 0    Vitals Temp: 98.3 F (36.8 C) BP: 116/75 Pulse Rate: 85 SpO2: 97 %   Anthropometric Measurements Height: 5\' 4"  (1.626 m) Weight: 195 lb (88.5 kg) BMI (Calculated): 33.46 Weight at Last Visit: 196 lb Weight Lost Since Last Visit: 1 lb Weight Gained Since Last Visit: 0 Starting Weight: 214 lb Total Weight Loss (lbs): 17 lb (7.711 kg) Peak Weight: 225 lb   Body Composition  Body Fat %: 40.2 % Fat Mass (lbs): 78.6 lbs Muscle Mass (lbs): 110.8 lbs Total Body Water (lbs): 78.8 lbs Visceral Fat Rating : 8   Other Clinical Data Fasting: No Labs: No Today's Visit #: 12 Starting Date: 09/15/22    Objective:   PHYSICAL EXAM: Blood pressure 116/75, pulse 85, temperature 98.3 F (36.8 C), height 5\' 4"  (1.626 m), weight 195 lb (88.5 kg), SpO2 97%. Body mass index is 33.47 kg/m.  General: she is overweight, cooperative and in no acute distress. PSYCH: Has normal mood, affect and thought process.   HEENT: EOMI, sclerae are anicteric. Lungs: Normal breathing effort, no conversational dyspnea. Extremities: Moves * 4 Neurologic: A and O * 3, good insight  DIAGNOSTIC DATA REVIEWED: BMET    Component Value Date/Time   NA 139 05/25/2023 0857   K 4.2 05/25/2023 0857   CL 102 05/25/2023 0857   CO2 21 05/25/2023 0857   GLUCOSE 84 05/25/2023 0857   GLUCOSE 73 01/06/2022 1518   BUN 13 05/25/2023 0857   CREATININE 0.85 05/25/2023 0857   CALCIUM 9.6 05/25/2023 0857   GFRNONAA >60 05/17/2016 0453   GFRAA >60 05/17/2016 0453   Lab Results  Component Value Date   HGBA1C 5.6 05/25/2023   HGBA1C 5.5 02/13/2015   Lab Results  Component Value Date   INSULIN 15.9 05/25/2023   INSULIN 20.1 09/15/2022   Lab Results  Component Value Date   TSH 2.400 09/15/2022   CBC    Component Value Date/Time   WBC 7.7 09/15/2022 0939   WBC 9.5 01/06/2022  1518   RBC 4.52 09/15/2022 0939   RBC 4.61 01/06/2022 1518   HGB 12.9 09/15/2022 0939   HCT 39.2 09/15/2022 0939   PLT 334 09/15/2022 0939   MCV 87 09/15/2022 0939   MCH 28.5 09/15/2022 0939   MCH 29.6 12/12/2019 0600   MCHC 32.9 09/15/2022 0939   MCHC 33.2 01/06/2022 1518   RDW 13.2 09/15/2022 0939   Iron Studies    Component Value Date/Time   IRON 54 06/12/2020 1045   FERRITIN 12.1 06/12/2020 1045   IRONPCTSAT 11.7 (L) 06/12/2020 1045   Lipid Panel     Component Value Date/Time   CHOL 237 (H) 05/25/2023 0857  TRIG 151 (H) 05/25/2023 0857   HDL 50 05/25/2023 0857   CHOLHDL 4.7 (H) 05/25/2023 0857   CHOLHDL 4 06/12/2020 1045   VLDL 27.8 06/12/2020 1045   LDLCALC 160 (H) 05/25/2023 0857   LDLDIRECT 150.0 02/13/2015 1449   Hepatic Function Panel     Component Value Date/Time   PROT 7.8 05/25/2023 0857   ALBUMIN 4.5 05/25/2023 0857   AST 12 05/25/2023 0857   ALT 14 05/25/2023 0857   ALKPHOS 68 05/25/2023 0857   BILITOT 0.4 05/25/2023 0857   BILIDIR 0.1 01/06/2022 1518      Component Value Date/Time   TSH 2.400 09/15/2022 0939   Nutritional Lab Results  Component Value Date   VD25OH 56.9 05/25/2023   VD25OH 32.6 09/15/2022    Attestations:   I, Isabelle Course, acting as a medical scribe for Thomasene Lot, DO., have compiled all relevant documentation for today's office visit on behalf of Thomasene Lot, DO, while in the presence of [employer], DO.  Reviewed by clinician on day of visit: allergies, medications, problem list, medical history, surgical history, family history, social history, and previous encounter notes pertinent to patient's obesity diagnosis.  I have spent 41  minutes in the care of the patient today including: preparing to see patient (e.g. review and interpretation of tests, old notes ), obtaining and/or reviewing separately obtained history, performing a medically appropriate examination or evaluation, counseling and educating the  patient, ordering medications, test or procedures, documenting clinical information in the electronic or other health care record, and independently interpreting results and communicating results to the patient, family, or caregiver   I have reviewed the above documentation for accuracy and completeness, and I agree with the above. [name], D.O.  The 21st Century Cures Act was signed into law in 2016 which includes the topic of electronic health records.  This provides immediate access to information in MyChart.  This includes consultation notes, operative notes, office notes, lab results and pathology reports.  If you have any questions about what you read please let us know at your next visit so we can discuss your concerns and take corrective action if need be.  We are right here with you.

## 2023-06-29 LAB — HM SIGMOIDOSCOPY

## 2023-07-05 ENCOUNTER — Telehealth (INDEPENDENT_AMBULATORY_CARE_PROVIDER_SITE_OTHER): Payer: BC Managed Care – PPO | Admitting: Psychology

## 2023-07-05 DIAGNOSIS — F5089 Other specified eating disorder: Secondary | ICD-10-CM

## 2023-07-05 DIAGNOSIS — F909 Attention-deficit hyperactivity disorder, unspecified type: Secondary | ICD-10-CM | POA: Diagnosis not present
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
## 2023-07-12 NOTE — Progress Notes (Deleted)
 [name] [address] [place] [address] Phone: [phone] Subjective:    I'm seeing this patient by the request  of:  Panosh, Neta Mends, MD  CC:   [id number]  Sheri [name] is a 33 y.o. female coming in with complaint of back and neck pain. OMT 06/01/2023. Patient states   Medications patient has been prescribed: Meloxicam  Taking:         Reviewed prior external information including notes and imaging from previsou exam, outside providers and external EMR if available.   As well as notes that were available from care everywhere and other healthcare systems.  Past medical history, social, surgical and family history all reviewed in electronic medical record.  No pertanent information unless stated regarding to the chief complaint.   Past Medical History:  Diagnosis Date   Alcohol addiction (HCC)    Anemia    Anxiety    Back pain    Bipolar depression (HCC)    under psych rx    Carpal tunnel syndrome    Chest pain    Depression    Drug addiction in remission [facility]    heroin   Enteritis due to Norovirus 05/18/2016   Heartburn    Hx of pulmonary embolus 02/13/2015   Hyperlipidemia    Hypokalemia 05/17/2016   IUD (intrauterine device) in place 02/13/2015   Joint pain    Mild mitral regurgitation    Mild tricuspid regurgitation    Mitral regurgitation 06/13/2018   Near syncope 05/16/2016   a. felt due to norovirus.   Nonallopathic lesion of cervical region 09/01/2018   Nonallopathic lesion of rib cage 09/01/2018   Obesity    Orthostatic hypotension 05/18/2016   Palpitations    PCOS (polycystic ovarian syndrome) 02/13/2015   Prediabetes    Pulmonary embolism (HCC) 2011   neg heme evaluation  felt to be from ocps    Recurrent upper respiratory infection (URI)    Scapular dyskinesis 09/01/2018   Smoker 12/30/2016   Syncope and collapse 05/16/2016   Thoracic outlet syndrome of left thoracic outlet 02/15/2018    Thoracic radiculopathy due to degenerative joint disease of spine 10/13/2016   CT and XR show at least 3 levels of DDD of thoracic spine  I suspect this is related to both the scoliosis and the history of dance   Urticaria     Allergies  Allergen Reactions   Bupropion Itching and Other (See Comments)     Review of Systems:  No headache, visual changes, nausea, vomiting, diarrhea, constipation, dizziness, abdominal pain, skin rash, fevers, chills, night sweats, weight loss, swollen lymph nodes, body aches, joint swelling, chest pain, shortness of breath, mood changes. POSITIVE muscle aches  Objective  There were no vitals taken for this visit.   General: No apparent distress alert and oriented x3 mood and affect normal, dressed appropriately.  HEENT: Pupils equal, extraocular movements intact  Respiratory: Patient's speak in full sentences and does not appear short of breath  Cardiovascular: No lower extremity edema, non tender, no erythema  Gait MSK:  Back   Osteopathic findings  C2 flexed rotated and side bent right C6 flexed rotated and side bent left T3 extended rotated and side bent right inhaled rib T9 extended rotated and side bent left L2 flexed rotated and side bent right Sacrum right on right       Assessment and Plan:  No problem-specific Assessment & Plan notes found for this encounter.  Nonallopathic problems  Decision today to treat with OMT was based on Physical Exam  After verbal consent patient was treated with HVLA, ME, FPR techniques in cervical, rib, thoracic, lumbar, and sacral  areas  Patient tolerated the procedure well with improvement in symptoms  Patient given exercises, stretches and lifestyle modifications  See medications in patient instructions if given  Patient will follow up in 4-8 weeks     The above documentation has been reviewed and is accurate and complete Judi Saa, DO         Note: This dictation was  prepared with Dragon dictation along with smaller phrase technology. Any transcriptional errors that result from this process are unintentional.

## 2023-07-13 ENCOUNTER — Ambulatory Visit: Payer: BC Managed Care – PPO | Admitting: Family Medicine

## 2023-07-19 ENCOUNTER — Telehealth (INDEPENDENT_AMBULATORY_CARE_PROVIDER_SITE_OTHER): Admitting: Psychology

## 2023-07-19 DIAGNOSIS — F5089 Other specified eating disorder: Secondary | ICD-10-CM

## 2023-07-19 DIAGNOSIS — F909 Attention-deficit hyperactivity disorder, unspecified type: Secondary | ICD-10-CM

## 2023-07-19 NOTE — Progress Notes (Signed)
  Office: [phone]  /  Fax: [phone]    Date: July 19, 2023  Appointment Start Time: 2:58pm Duration: 20 minutes Provider: Lawerance Cruel, Psy.D. Type of Session: Individual Therapy  Location of Patient: Work (private location) Location of Provider: Provider's Home (private office) Type of Contact: Telepsychological Visit via MyChart Video Visit  Session Content: Tynslee is a 33 y.o. female presenting for a follow-up appointment to address the previously established treatment goal of increasing coping skills.Today's appointment was a telepsychological visit. Donnae provided verbal consent for today's telepsychological appointment and she is aware she is responsible for securing confidentiality on her end of the session. Prior to proceeding with today's appointment, Bri's physical location at the time of this appointment was obtained as well a phone number she could be reached at in the event of technical difficulties. Hildur and this provider participated in today's telepsychological service.   This provider conducted a brief check-in. Georgina reported loved ones feel she meets criteria for ADHD. Additional information regarding an evaluation was provided, and she provided consent for this provider to place a referral. Regarding her eating habits, she acknowledged "not being as focused" in the past month. Reviewed recent eating habits. Psychoeducation regarding SMART goals was provided and Zonya was engaged in goal setting. The following goal was established: Helia will eat a frozen meal as outlined in her prescribed structured meal plan at least 2 out of 7 days a week between now and the next appointment with this provider. Overall, Armonii was receptive to today's appointment as evidenced by openness to sharing, responsiveness to feedback, and willingness to work toward the established SMART goal.  Mental Status Examination:  Appearance: neat Behavior:  appropriate to circumstances Mood: neutral Affect: mood congruent Speech: WNL Eye Contact: appropriate Psychomotor Activity: WNL Gait: unable to assess Thought Process: linear, logical, and goal directed and no evidence or endorsement of suicidal, homicidal, and self-harm ideation, plan and intent  Thought Content/Perception: no hallucinations, delusions, bizarre thinking or behavior endorsed or observed Orientation: AAOx4 Memory/Concentration: intact Insight: fair Judgment: fair  Interventions:  Conducted a brief chart review Provided empathic reflections and validation Employed supportive psychotherapy interventions to facilitate reduced distress and to improve coping skills with identified stressors Engaged patient in goal setting Psychoeducation provided regarding SMART goals  DSM-5 Diagnosis(es):  F50.89 Other Specified Feeding or Eating Disorder, Emotional and Binge Eating Behaviors and F90.9 Unspecified Attention-Deficit/Hyperactivity Disorder   Treatment Goal & Progress: During the initial appointment with this provider, the following treatment goal was established: increase coping skills. Progress is limited, as Jorene has just begun treatment with this provider; however, she is receptive to the interaction and interventions and rapport is being established.   Plan: The next appointment is scheduled for 08/16/2023 at 8am, which will be via MyChart Video Visit. The next session will focus on working towards the established treatment goal. Additionally, she provided verbal consent for this provider to place a referral with [facility].    Lawerance Cruel, PsyD

## 2023-07-22 ENCOUNTER — Ambulatory Visit (INDEPENDENT_AMBULATORY_CARE_PROVIDER_SITE_OTHER): Admitting: Family Medicine

## 2023-07-22 ENCOUNTER — Encounter (INDEPENDENT_AMBULATORY_CARE_PROVIDER_SITE_OTHER): Payer: Self-pay | Admitting: Family Medicine

## 2023-07-22 VITALS — BP 120/78 | HR 80 | Temp 98.0°F | Ht 64.0 in | Wt 196.0 lb

## 2023-07-22 DIAGNOSIS — E538 Deficiency of other specified B group vitamins: Secondary | ICD-10-CM

## 2023-07-22 DIAGNOSIS — Z6833 Body mass index (BMI) 33.0-33.9, adult: Secondary | ICD-10-CM

## 2023-07-22 DIAGNOSIS — F5089 Other specified eating disorder: Secondary | ICD-10-CM | POA: Diagnosis not present

## 2023-07-22 DIAGNOSIS — E669 Obesity, unspecified: Secondary | ICD-10-CM

## 2023-07-22 DIAGNOSIS — E559 Vitamin D deficiency, unspecified: Secondary | ICD-10-CM

## 2023-07-22 DIAGNOSIS — R7303 Prediabetes: Secondary | ICD-10-CM | POA: Diagnosis not present

## 2023-07-22 DIAGNOSIS — E66812 Obesity, class 2: Secondary | ICD-10-CM

## 2023-07-22 MED ORDER — LOMAIRA 8 MG PO TABS: 8.0000 mg | ORAL_TABLET | Freq: Every day | ORAL | 0 refills | Status: DC

## 2023-07-22 MED ORDER — METFORMIN HCL 500 MG PO TABS: ORAL_TABLET | ORAL | 0 refills | Status: DC

## 2023-07-22 MED ORDER — VITAMIN D (ERGOCALCIFEROL) 1.25 MG (50000 UNIT) PO CAPS: 50000.0000 [IU] | ORAL_CAPSULE | ORAL | 0 refills | Status: DC

## 2023-07-22 MED ORDER — TOPIRAMATE 50 MG PO TABS: 50.0000 mg | ORAL_TABLET | Freq: Two times a day (BID) | ORAL | 0 refills | Status: DC

## 2023-07-22 NOTE — Progress Notes (Signed)
 Sheri [name], D.O.  ABFM, ABOM Specializing in Clinical Bariatric Medicine  Office located at: [address] [place], [place]  [address]   Assessment and Plan:   Medications Discontinued During This Encounter  Medication Reason   amoxicillin-clavulanate (AUGMENTIN) 875-125 MG tablet    topiramate (TOPAMAX) 50 MG tablet Reorder   Vitamin D, Ergocalciferol, (DRISDOL) 1.25 MG (50000 UNIT) CAPS capsule Reorder   metFORMIN (GLUCOPHAGE) 500 MG tablet Reorder   Phentermine HCl (LOMAIRA) 8 MG TABS Reorder    Meds ordered this encounter  Medications   Phentermine HCl (LOMAIRA) 8 MG TABS    Sig: Take 1 tablet (8 mg total) by mouth daily.    Dispense:  30 tablet    Refill:  0   Vitamin D, Ergocalciferol, (DRISDOL) 1.25 MG (50000 UNIT) CAPS capsule    Sig: Take 1 capsule (50,000 Units total) by mouth every 7 (seven) days.    Dispense:  4 capsule    Refill:  0   topiramate (TOPAMAX) 50 MG tablet    Sig: Take 1 tablet (50 mg total) by mouth 2 (two) times daily.    Dispense:  60 tablet    Refill:  0   metFORMIN (GLUCOPHAGE) 500 MG tablet    Sig: 1 po with food at B, L, D    Dispense:  90 tablet    Refill:  0     FOR THE DISEASE OF OBESITY: BMI 33.0-33.9,adult - Current BMI 33.63 Obesity with starting BMI of 36.8 Assessment & Plan: Since last office visit on 06/21/23 patient's muscle mass has increased by 2 lb. Fat mass has decreased by 1.4 lb. Total body water has increased by 1.4 lb.  Counseling done on how various foods will affect these numbers and how to maximize success  Total lbs lost to date: 18 lbs  Total weight loss percentage to date: -8.41%    Recommended Dietary Goals Sheri [name] is currently in the action stage of change. As such, her goal is to continue weight management plan.  She has agreed to: continue current plan   Behavioral Intervention We discussed the following today: increasing lean protein intake to established goals, decreasing simple  carbohydrates , increasing water intake , and continue to work on maintaining a reduced calorie state, getting the recommended amount of protein, incorporating whole foods, making healthy choices, staying well hydrated and practicing mindfulness when eating.  Additional resources provided today: None, Handout on risks/ benefits of metformin and associated Myths of use, and Handout on Common Characteristics of Successful Weight Losers,  Evidence-based interventions for health behavior change were utilized today including the discussion of self monitoring techniques, problem-solving barriers and SMART goal setting techniques.   Regarding patient's less desirable eating habits and patterns, we employed the technique of small changes.   Pt will specifically work on: N/a   Recommended Physical Activity Goals Sheri [name] has been advised to work up to 150 minutes of moderate intensity aerobic activity a week and strengthening exercises 2-3 times per week for cardiovascular health, weight loss maintenance and preservation of muscle mass.   She has agreed to :  Continue current level of physical activity , Think about enjoyable ways to increase daily physical activity and overcoming barriers to exercise, and Increase physical activity in their day and reduce sedentary time (increase NEAT).   Pharmacotherapy We both agreed to: continue with nutritional and behavioral strategies, continue Lomaira dose, Increase Metformin, and Increase Topamax.    FOR ASSOCIATED CONDITIONS ADDRESSED TODAY: Prediabetes  Assessment & Plan: Lab Results  Component Value Date   HGBA1C 5.6 05/25/2023   HGBA1C 5.7 (H) 09/15/2022   HGBA1C 6.3 01/06/2022   INSULIN 15.9 05/25/2023   INSULIN 20.1 09/15/2022    Pt is on Metformin 500 mg BID with lunch and dinner. Good compliance and tolerance with no adverse SE reported. Endorses carb cravings and food noise.   After discussing all options, we mutually agreed to increase  Metformin to TID with breakfast, lunch, and dinner for better control of cravings and food noise. Reviewed potential risks and side effects with increasing Metformin dose, especially when eating off plan. Encouraged pt to eat on plan and continue regular exercise. Increase protein intake and decrease simple carbs/sugars. Will continue to monitor condition as it relates to her weight loss journey.   Orders: - Increase Metformin, 500 mg TID with B/L/D   Other disorder of eating- emotional eating Assessment & Plan: Moods are currently stable. Denies any SI/HI. Pt is compliant with Lomaira 8 mg once daily and Topiramate 50 mg once daily. Tolerating both medications well with no SE reported. Hunger and cravings are uncontrolled. She endorses increased carb cravings and food noise, which she attributes to eating less protein in the last month. She has tried cutting back on frozen meals, now eating only 2 per week.   Counseled pt on increasing protein intake to improve her hunger, cravings, and food noise. We also mutually agreed to increase her Topiramate today to help with these cravings and food noise. Will increase Topiramate to 50 mg BID. - Reviewed benefits and potential risks/side effects when eating off plan. Continue Lomaira at current dose, will refill today. Reviewed healthier meal prep alternatives to microwavable meals. Encouraged pt to follow her meal plan by decreasing simple carbs/sugars. Will continue to monitor her condition.   Orders: - Increase Topiramate 50 mg BID - Refill Lomaira, no dose changes   B12 deficiency Assessment & Plan: Lab Results  Component Value Date   VITAMINB12 420 05/25/2023   Pt is on Cyanocobalamin 500 mcg once daily. Good compliance and tolerance with no side effects reported. No acute concerns reported today.   Continue with current supplementation as directed. No changes made today. Will continue to monitor condition.    Vitamin D  deficiency Assessment & Plan: Lab Results  Component Value Date   VD25OH 56.9 05/25/2023   VD25OH 32.6 09/15/2022   Last vitamin D was at goal. Pt is compliant with ERGO 50K units once weekly. Tolerating well, no SE reported. No acute concerns in this regard.   Continue with current supplementation regimen. Will refill ERGO with no changes made today. Will continue to monitor her condition as it relates to her weight loss journey.   Orders: - Refill ERGO, no dose changes.    Follow up:   Return in about 4 weeks (around 08/19/2023) for 4 week follow up x2. She was informed of the importance of frequent follow up visits to maximize her success with intensive lifestyle modifications for her multiple health conditions.  Subjective:   Chief complaint: Obesity Sheri [name] is here to discuss her progress with her obesity treatment plan. She is on the Category 2 Plan with B/L options and keeping a food journal and adhering to recommended goals of 1200-1400 calories and 90++ g of protein and journaling  and states she is following her eating plan approximately 10% of the time. She states she is doing yoga 60 minutes 2 days per week.  Interval History:  Sheri [name] is here for a follow up office visit. Since last OV on 06/21/23, she has gained 1 lb. She has been a bit frustrated with her weight loss progress, especially in the last week or so. Usually has carb cravings and food noise. She states her "biggest challenge is the compulsion" to eat.   Pharmacotherapy for weight loss: She is currently taking Metformin (off label use for incretin effect and / or insulin resistance and / or diabetes prevention) with adequate clinical response  and without side effects., Topiramate (off label use, single agent) with adequate clinical response  and without side effects., and Phentermine (longterm use, single agent)  with adequate clinical response  and without side effects..   Review of Systems:   Pertinent positives were addressed with patient today.  Reviewed by clinician on day of visit: allergies, medications, problem list, medical history, surgical history, family history, social history, and previous encounter notes.  Weight Summary and Biometrics   Weight Lost Since Last Visit: 0  Weight Gained Since Last Visit: 1 lb   Vitals Temp: 98 F (36.7 C) BP: 120/78 Pulse Rate: 80 SpO2: 100 %   Anthropometric Measurements Height: 5\' 4"  (1.626 m) Weight: 196 lb (88.9 kg) BMI (Calculated): 33.63 Weight at Last Visit: 195 lb Weight Lost Since Last Visit: 0 Weight Gained Since Last Visit: 1 lb Starting Weight: 214 lb Total Weight Loss (lbs): 16 lb (7.258 kg) Peak Weight: 225 lb   Body Composition  Body Fat %: 39.4 % Fat Mass (lbs): 77.2 lbs Muscle Mass (lbs): 112.8 lbs Total Body Water (lbs): 80.2 lbs Visceral Fat Rating : 8   Other Clinical Data Fasting: no Labs: no Today's Visit #: 14 Starting Date: 09/15/22    Objective:   PHYSICAL EXAM: Blood pressure 120/78, pulse 80, temperature 98 F (36.7 C), height 5\' 4"  (1.626 m), weight 196 lb (88.9 kg), SpO2 100%. Body mass index is 33.64 kg/m.  General: she is overweight, cooperative and in no acute distress. PSYCH: Has normal mood, affect and thought process.   HEENT: EOMI, sclerae are anicteric. Lungs: Normal breathing effort, no conversational dyspnea. Extremities: Moves * 4 Neurologic: A and O * 3, good insight  DIAGNOSTIC DATA REVIEWED: BMET    Component Value Date/Time   NA 139 05/25/2023 0857   K 4.2 05/25/2023 0857   CL 102 05/25/2023 0857   CO2 21 05/25/2023 0857   GLUCOSE 84 05/25/2023 0857   GLUCOSE 73 01/06/2022 1518   BUN 13 05/25/2023 0857   CREATININE 0.85 05/25/2023 0857   CALCIUM 9.6 05/25/2023 0857   GFRNONAA >60 05/17/2016 0453   GFRAA >60 05/17/2016 0453   Lab Results  Component Value Date   HGBA1C 5.6 05/25/2023   HGBA1C 5.5 02/13/2015   Lab Results  Component  Value Date   INSULIN 15.9 05/25/2023   INSULIN 20.1 09/15/2022   Lab Results  Component Value Date   TSH 2.400 09/15/2022   CBC    Component Value Date/Time   WBC 7.7 09/15/2022 0939   WBC 9.5 01/06/2022 1518   RBC 4.52 09/15/2022 0939   RBC 4.61 01/06/2022 1518   HGB 12.9 09/15/2022 0939   HCT 39.2 09/15/2022 0939   PLT 334 09/15/2022 0939   MCV 87 09/15/2022 0939   MCH 28.5 09/15/2022 0939   MCH 29.6 12/12/2019 0600   MCHC 32.9 09/15/2022 0939   MCHC 33.2 01/06/2022 1518   RDW 13.2 09/15/2022 0939   Iron Studies    Component  Value Date/Time   IRON 54 06/12/2020 1045   FERRITIN 12.1 06/12/2020 1045   IRONPCTSAT 11.7 (L) 06/12/2020 1045   Lipid Panel     Component Value Date/Time   CHOL 237 (H) 05/25/2023 0857   TRIG 151 (H) 05/25/2023 0857   HDL 50 05/25/2023 0857   CHOLHDL 4.7 (H) 05/25/2023 0857   CHOLHDL 4 06/12/2020 1045   VLDL 27.8 06/12/2020 1045   LDLCALC 160 (H) 05/25/2023 0857   LDLDIRECT 150.0 02/13/2015 1449   Hepatic Function Panel     Component Value Date/Time   PROT 7.8 05/25/2023 0857   ALBUMIN 4.5 05/25/2023 0857   AST 12 05/25/2023 0857   ALT 14 05/25/2023 0857   ALKPHOS 68 05/25/2023 0857   BILITOT 0.4 05/25/2023 0857   BILIDIR 0.1 01/06/2022 1518      Component Value Date/Time   TSH 2.400 09/15/2022 0939   Nutritional Lab Results  Component Value Date   VD25OH 56.9 05/25/2023   VD25OH 32.6 09/15/2022    Attestations:   I, Isabelle Course, acting as a medical scribe for Thomasene Lot, DO., have compiled all relevant documentation for today's office visit on behalf of Thomasene Lot, DO, while in the presence of [employer], DO.  Reviewed by clinician on day of visit: allergies, medications, problem list, medical history, surgical history, family history, social history, and previous encounter notes pertinent to patient's obesity diagnosis.  I have reviewed the above documentation for accuracy and completeness, and I agree  with the above. Sheri [name], D.O.  The 21st Century Cures Act was signed into law in 2016 which includes the topic of electronic health records.  This provides immediate access to information in MyChart.  This includes consultation notes, operative notes, office notes, lab results and pathology reports.  If you have any questions about what you read please let us know at your next visit so we can discuss your concerns and take corrective action if need be.  We are right here with you.

## 2023-07-29 ENCOUNTER — Other Ambulatory Visit (INDEPENDENT_AMBULATORY_CARE_PROVIDER_SITE_OTHER): Payer: Self-pay | Admitting: Family Medicine

## 2023-07-29 DIAGNOSIS — E559 Vitamin D deficiency, unspecified: Secondary | ICD-10-CM

## 2023-08-07 ENCOUNTER — Other Ambulatory Visit (HOSPITAL_COMMUNITY): Payer: Self-pay | Admitting: Psychiatry

## 2023-08-07 DIAGNOSIS — F39 Unspecified mood [affective] disorder: Secondary | ICD-10-CM

## 2023-08-07 DIAGNOSIS — F419 Anxiety disorder, unspecified: Secondary | ICD-10-CM

## 2023-08-09 ENCOUNTER — Telehealth (HOSPITAL_BASED_OUTPATIENT_CLINIC_OR_DEPARTMENT_OTHER): Payer: Self-pay | Admitting: Psychiatry

## 2023-08-09 ENCOUNTER — Encounter (HOSPITAL_COMMUNITY): Payer: Self-pay | Admitting: Psychiatry

## 2023-08-09 DIAGNOSIS — F419 Anxiety disorder, unspecified: Secondary | ICD-10-CM

## 2023-08-09 DIAGNOSIS — F39 Unspecified mood [affective] disorder: Secondary | ICD-10-CM

## 2023-08-09 MED ORDER — LAMOTRIGINE 100 MG PO TABS: 100.0000 mg | ORAL_TABLET | Freq: Every day | ORAL | 0 refills | Status: DC

## 2023-08-09 MED ORDER — BUSPIRONE HCL 15 MG PO TABS: 15.0000 mg | ORAL_TABLET | Freq: Two times a day (BID) | ORAL | 0 refills | Status: DC

## 2023-08-09 MED ORDER — SERTRALINE HCL 50 MG PO TABS: 50.0000 mg | ORAL_TABLET | Freq: Every day | ORAL | 0 refills | Status: DC

## 2023-08-09 NOTE — Progress Notes (Signed)
 [facility] MD Virtual Progress Note   Patient Location: Home Provider Location: Home Office  I connect with patient by video and verified that I am speaking with correct person by using two identifiers. I discussed the limitations of evaluation and management by telemedicine and the availability of in person appointments. I also discussed with the patient that there may be a patient responsible charge related to this service. The patient expressed understanding and agreed to proceed.  Sheri [name] [id number] 33 y.o.  08/09/2023 3:05 PM  History of Present Illness:  Patient is evaluated by video session.  She reported things are going okay.  Recently her Topamax  dose was increased by her weight loss program and she noticed forgetfulness and sometimes fogginess.  She also told that husband noticed the same symptoms.  She reported her job is going okay.  She denies any crying spells or any feeling of hopelessness or worthlessness.  She is seeing a therapist at weight loss program and patient discuss about her past ADHD.  Her therapist recommended to retest as her eating and ADHD can be helpful by medication.  However patient is not interested to take any stimulants.  Given the history of mood disorder I will also recommend not to get stimulant however if needed Strattera can be prescribed.  After discussing pros and cons about medication she decided not to proceed with ADHD symptoms.  She feels her mood symptoms are stable.  She denies any panic attack, nervousness, suicidal thoughts.  Her job is okay.  She had a good support from her husband.  She lives with her 31 year old stepson and 52-year-old daughter.  She had a good support from her family.  Patient remains sober from drinking and drugs for more than 9 years.  Patient denies any mania, psychosis, hallucination.  Past Psychiatric History: H/O depression, anxiety, Bipolar, post partum and ADHD. On medication since age 13.   H/O addiction with opiates, Benzo and Adderall. Received therapy and medications at [facility] counseling and [facility] but not happy with care. Had rehab at [facility] in 2015.  Did well for 5 years without medication until symptoms returns. H/O suicidal thoughts but no attempt. No psychosis.  Tried Celexa caused dizziness, trazodone, weight gain.  Took Lamictal , Klonopin, Lexapro, hydroxyzine , Ambien , Seroquel, Latuda, Hydroxyzine  and Wellbutrin.        Outpatient Encounter Medications as of 08/09/2023  Medication Sig   albuterol  (PROAIR  HFA) 108 (90 Base) MCG/ACT inhaler Inhale 2 puffs into the lungs every 6 (six) hours as needed for wheezing or shortness of breath.   Albuterol -Budesonide (AIRSUPRA ) 90-80 MCG/ACT AERO Inhale 2 puffs into the lungs every 4 (four) hours as needed (coughing, wheezing, chest tightness). Do not exceed 12 puffs in 24 hours.   aspirin EC 81 MG tablet Take 81 mg by mouth daily. Swallow whole.   busPIRone  (BUSPAR ) 15 MG tablet Take 1 tablet (15 mg total) by mouth 2 (two) times daily.   cetirizine (ZYRTEC) 10 MG tablet Take 10 mg by mouth daily.   cyanocobalamin  (VITAMIN B12) 500 MCG tablet Take 1 tablet (500 mcg total) by mouth daily.   fluticasone  furoate-vilanterol (BREO ELLIPTA ) 100-25 MCG/ACT AEPB Inhale 1 puff into the lungs daily. Rinse mouth after each use.   lamoTRIgine  (LAMICTAL ) 100 MG tablet Take 1 tablet (100 mg total) by mouth daily.   meloxicam  (MOBIC ) 15 MG tablet TAKE 1 TABLET (15 MG TOTAL) BY MOUTH DAILY.   metFORMIN  (GLUCOPHAGE ) 500 MG tablet 1 po with food at  B, L, D   ondansetron  (ZOFRAN -ODT) 4 MG disintegrating tablet Take 1 tablet (4 mg total) by mouth every 8 (eight) hours as needed for nausea or vomiting.   paragard intrauterine copper IUD IUD 1 each by Intrauterine route once.   Phentermine  HCl (LOMAIRA ) 8 MG TABS Take 1 tablet (8 mg total) by mouth daily.   sertraline  (ZOLOFT ) 50 MG tablet Take 1 tablet (50 mg total) by mouth  daily.   topiramate  (TOPAMAX ) 50 MG tablet Take 1 tablet (50 mg total) by mouth 2 (two) times daily.   Vitamin D , Ergocalciferol , (DRISDOL ) 1.25 MG (50000 UNIT) CAPS capsule Take 1 capsule (50,000 Units total) by mouth every 7 (seven) days.   No facility-administered encounter medications on file as of 08/09/2023.    Recent Results (from the past 2160 hours)  Vitamin B12     Status: None   Collection Time: 05/25/23  8:57 AM  Result Value Ref Range   Vitamin B-12 420 232 - 1,245 pg/mL  VITAMIN D  25 Hydroxy (Vit-D Deficiency, Fractures)     Status: None   Collection Time: 05/25/23  8:57 AM  Result Value Ref Range   Vit D, 25-Hydroxy 56.9 30.0 - 100.0 ng/mL    Comment: Vitamin D  deficiency has been defined by the Institute of Medicine and an Endocrine Society practice guideline as a level of serum 25-OH vitamin D  less than 20 ng/mL (1,2). The Endocrine Society went on to further define vitamin D  insufficiency as a level between 21 and 29 ng/mL (2). 1. IOM (Institute of Medicine). 2010. Dietary reference    intakes for calcium and D. [place]: The    [employer]. 2. Holick MF, Binkley [place], Bischoff-Ferrari HA, et al.    Evaluation, treatment, and prevention of vitamin D     deficiency: an Endocrine Society clinical practice    guideline. JCEM. 2011 Jul; 96(7):1911-30.   Lipid panel     Status: Abnormal   Collection Time: 05/25/23  8:57 AM  Result Value Ref Range   Cholesterol, Total 237 (H) 100 - 199 mg/dL   Triglycerides 161 (H) 0 - 149 mg/dL   HDL 50 >09 mg/dL   VLDL Cholesterol Cal 27 5 - 40 mg/dL   LDL Chol Calc (NIH) 604 (H) 0 - 99 mg/dL   Chol/HDL Ratio 4.7 (H) 0.0 - 4.4 ratio    Comment:                                   T. Chol/HDL Ratio                                             Men  Women                               1/2 Avg.Risk  3.4    3.3                                   Avg.Risk  5.0    4.4  2X Avg.Risk  9.6    7.1                                 3X Avg.Risk 23.4   11.0   Insulin , random     Status: None   Collection Time: 05/25/23  8:57 AM  Result Value Ref Range   INSULIN  15.9 2.6 - 24.9 uIU/mL  Hemoglobin A1c     Status: None   Collection Time: 05/25/23  8:57 AM  Result Value Ref Range   Hgb A1c MFr Bld 5.6 4.8 - 5.6 %    Comment:          Prediabetes: 5.7 - 6.4          Diabetes: >6.4          Glycemic control for adults with diabetes: <7.0    Est. average glucose Bld gHb Est-mCnc 114 mg/dL  Comprehensive metabolic panel     Status: None   Collection Time: 05/25/23  8:57 AM  Result Value Ref Range   Glucose 84 70 - 99 mg/dL   BUN 13 6 - 20 mg/dL   Creatinine, Ser 1.91 0.57 - 1.00 mg/dL   eGFR 92 >47 WG/NFA/2.13   BUN/Creatinine Ratio 15 9 - 23   Sodium 139 134 - 144 mmol/L   Potassium 4.2 3.5 - 5.2 mmol/L   Chloride 102 96 - 106 mmol/L   CO2 21 20 - 29 mmol/L   Calcium 9.6 8.7 - 10.2 mg/dL   Total Protein 7.8 6.0 - 8.5 g/dL   Albumin 4.5 3.9 - 4.9 g/dL   Globulin, Total 3.3 1.5 - 4.5 g/dL   Bilirubin Total 0.4 0.0 - 1.2 mg/dL   Alkaline Phosphatase 68 44 - 121 IU/L   AST 12 0 - 40 IU/L   ALT 14 0 - 32 IU/L  HM SIGMOIDOSCOPY     Status: None   Collection Time: 06/29/23  9:42 AM  Result Value Ref Range   HM Sigmoidoscopy See Report (in chart)     Comment: ABSTRACTED BY HIM     Psychiatric Specialty Exam: Physical Exam  Review of Systems  Weight 196 lb (88.9 kg).There is no height or weight on file to calculate BMI.  General Appearance: Casual  Eye Contact:  Good  Speech:  Clear and Coherent and Normal Rate  Volume:  Increased  Mood:  Euthymic  Affect:  Appropriate  Thought Process:  Goal Directed  Orientation:  Full (Time, Place, and Person)  Thought Content:  Logical  Suicidal Thoughts:  No  Homicidal Thoughts:  No  Memory:  Immediate;   Good Recent;   Good Remote;   Good  Judgement:  Good  Insight:  Good  Psychomotor Activity:  Normal  Concentration:   Concentration: Good and Attention Span: Good  Recall:  Good  Fund of Knowledge:  Good  Language:  Good  Akathisia:  No  Handed:  Right  AIMS (if indicated):     Assets:  Communication Skills Desire for Improvement Housing Resilience Social Support Talents/Skills Transportation  ADL's:  Intact  Cognition:  WNL  Sleep:  ok       04/15/2023    8:59 AM 04/30/2022    2:10 PM 01/06/2022    2:45 PM 06/05/2020    1:47 PM 06/02/2016    3:44 PM  Depression screen PHQ 2/9  Decreased Interest 0 0 0 0 0  Down, Depressed, Hopeless 0  0 0 0 0  PHQ - 2 Score 0 0 0 0 0  Altered sleeping 0 0 0    Tired, decreased energy 0 0 1    Change in appetite 0 0 1    Feeling bad or failure about yourself  0 0 0    Trouble concentrating 0 0 0    Moving slowly or fidgety/restless 0 0 0    Suicidal thoughts 0 0 0    PHQ-9 Score 0 0 2    Difficult doing work/chores Not difficult at all Not difficult at all Not difficult at all      Assessment/Plan: Anxiety - Plan: busPIRone  (BUSPAR ) 15 MG tablet, sertraline  (ZOLOFT ) 50 MG tablet, lamoTRIgine  (LAMICTAL ) 100 MG tablet  Mood disorder (HCC) - Plan: sertraline  (ZOLOFT ) 50 MG tablet, lamoTRIgine  (LAMICTAL ) 100 MG tablet  I reviewed blood work results.  Patient is in a weight loss program.  Discussed increase Topamax  which could be causing forgetfulness and patient is going to talk to her doctor as appointment coming up in 1 week.  I will also forward my note to her weight loss provider.  We discussed about retesting of ADHD however patient not interested to go back on stimulant given the history of mood disorder.  Currently she is doing okay on her current medication.  She has no rash, itching, tremors or shakes.  She is in therapy with Loetta Ringer at [facility] for EMDR.  Continue BuSpar  15 mg twice a day, Lamictal  100 mg daily and Zoloft  50 mg daily.  Encourage therapy.  Recommend to call us  back if she is any question or any concern.  Follow-up in 3  months.   Follow Up Instructions:     I discussed the assessment and treatment plan with the patient. The patient was provided an opportunity to ask questions and all were answered. The patient agreed with the plan and demonstrated an understanding of the instructions.   The patient was advised to call back or seek an in-person evaluation if the symptoms worsen or if the condition fails to improve as anticipated.    Collaboration of Care: Other provider involved in patient's care AEB notes are available in epic to review  Patient/Guardian was advised Release of Information must be obtained prior to any record release in order to collaborate their care with an outside provider. Patient/Guardian was advised if they have not already done so to contact the registration department to sign all necessary forms in order for us  to release information regarding their care.   Consent: Patient/Guardian gives verbal consent for treatment and assignment of benefits for services provided during this visit. Patient/Guardian expressed understanding and agreed to proceed.     Total encounter time 20 minutes which includes face-to-face time, chart reviewed, care coordination, order entry and documentation during this encounter.   Note: This document was prepared by [employer] voice dictation technology and any errors that results from this process are unintentional.    Arturo Late, MD 08/09/2023

## 2023-08-10 ENCOUNTER — Telehealth: Payer: Self-pay

## 2023-08-10 ENCOUNTER — Other Ambulatory Visit (HOSPITAL_COMMUNITY): Payer: Self-pay

## 2023-08-10 ENCOUNTER — Encounter (INDEPENDENT_AMBULATORY_CARE_PROVIDER_SITE_OTHER): Payer: Self-pay | Admitting: Family Medicine

## 2023-08-10 NOTE — Telephone Encounter (Signed)
 Pharmacy Patient Advocate Encounter   Received notification from RX Request Messages that prior authorization for AIRSUPRA   is required/requested.   Insurance verification completed.   The patient is insured through [facility] .   Per test claim: Refill too soon. PA is not needed at this time. Medication was filled 04/18. Next eligible fill date is 04/26.

## 2023-08-15 NOTE — Progress Notes (Unsigned)
   [address] [place] [address] Dept: [phone]  FOLLOW UP NOTE  Patient ID: Sheri [name], female    DOB: Jul 25, 1990  Age: 33 y.o. MRN: [id number] Date of Office Visit: 08/16/2023  Assessment  Chief Complaint: No chief complaint on file.  HPI Sheri [name] is a 33 year old female who presents [name] the clinic for environmental allergy testing.  She was last seen in this clinic on 05/21/2023 by Dr. Burdette Carolin as a new patient for evaluation of asthma, allergic rhinitis, recurrent infection, and history of PE.  Discussed the use of AI scribe software for clinical note transcription with the patient, who gave verbal consent [name] proceed.  History of Present Illness      Drug Allergies:  Allergies  Allergen Reactions   Bupropion Itching and Other (See Comments)    Physical Exam: There were no vitals taken for this visit.   Physical Exam  Diagnostics:    Assessment and Plan: No diagnosis found.  No orders of the defined types were placed in this encounter.   There are no Patient Instructions on file for this visit.  No follow-ups on file.    Thank you for the opportunity [name] care for this patient.  Please do not hesitate [name] contact me with questions.  Marinus Sic, FNP [facility]

## 2023-08-15 NOTE — Patient Instructions (Incomplete)
 Allergic rhinitis Your skin testing  Asthma Continue Breo 1 puff once a day to prevent cough or wheeze May use Airsupra  2 puffs if needed for cough or wheeze.  Do not use this medication more than 12 puffs in a 24-hour time span  Recurrent infection Continue to keep track of infection, antibiotic use, and steroid use.  Call the clinic if this treatment plan is not working well for you.  Follow up in *** or sooner if needed.

## 2023-08-16 ENCOUNTER — Ambulatory Visit: Payer: BC Managed Care – PPO | Admitting: Allergy

## 2023-08-16 ENCOUNTER — Ambulatory Visit: Admitting: Family Medicine

## 2023-08-16 ENCOUNTER — Telehealth (INDEPENDENT_AMBULATORY_CARE_PROVIDER_SITE_OTHER): Admitting: Psychology

## 2023-08-16 ENCOUNTER — Encounter: Payer: Self-pay | Admitting: Family Medicine

## 2023-08-16 ENCOUNTER — Other Ambulatory Visit: Payer: Self-pay

## 2023-08-16 VITALS — BP 114/70 | HR 76 | Temp 98.3°F | Resp 16

## 2023-08-16 DIAGNOSIS — F909 Attention-deficit hyperactivity disorder, unspecified type: Secondary | ICD-10-CM | POA: Diagnosis not present

## 2023-08-16 DIAGNOSIS — H1013 Acute atopic conjunctivitis, bilateral: Secondary | ICD-10-CM

## 2023-08-16 DIAGNOSIS — J302 Other seasonal allergic rhinitis: Secondary | ICD-10-CM

## 2023-08-16 DIAGNOSIS — B999 Unspecified infectious disease: Secondary | ICD-10-CM | POA: Diagnosis not present

## 2023-08-16 DIAGNOSIS — J3089 Other allergic rhinitis: Secondary | ICD-10-CM

## 2023-08-16 DIAGNOSIS — J4540 Moderate persistent asthma, uncomplicated: Secondary | ICD-10-CM | POA: Diagnosis not present

## 2023-08-16 DIAGNOSIS — F5089 Other specified eating disorder: Secondary | ICD-10-CM | POA: Diagnosis not present

## 2023-08-16 DIAGNOSIS — H1010 Acute atopic conjunctivitis, unspecified eye: Secondary | ICD-10-CM

## 2023-08-16 NOTE — Progress Notes (Signed)
  Office: [phone]  /  Fax: [phone]    Date: August 16, 2023  Appointment Start Time: 8:01am Duration: 27 minutes Provider: Catherene Close, Psy.D. Type of Session: Individual Therapy  Location of Patient: Parked in car (address obtained; private location) Location of Provider: Provider's Home (private office) Type of Contact: Telepsychological Visit via MyChart Video Visit  Session Content: Sheri [name] is a 33 y.o. female presenting for a follow-up appointment to address the previously established treatment goal of increasing coping skills.Today's appointment was a telepsychological visit. Sheri [name] provided verbal consent for today's telepsychological appointment and she is aware she is responsible for securing confidentiality on her end of the session. Prior to proceeding with today's appointment, Sheri [name]'s physical location at the time of this appointment was obtained as well a phone number she could be reached at in the event of technical difficulties. Sheri [name] and this provider participated in today's telepsychological service.   This provider conducted a brief check-in. Sheri [name] shared about recent meal prep and issues with rxs. Reviewed established SMART goal. She indicated she met her goal. Discussed what went well and what was challenging. Engaged in goal setting. The following new goal was established: Sheri [name] will eat a frozen meal as outlined in her prescribed structured meal plan at least 3 out of 7 days a week between now and the next appointment with this provider. Additionally, psychoeducation regarding triggers for emotional eating was provided. Sheri [name] was provided a handout, and encouraged to utilize the handout between now and the next appointment to increase awareness of triggers and frequency. Sheri [name] agreed. This provider also discussed behavioral strategies for specific triggers, such as placing the utensil down when conversing to avoid mindless eating. Sheri [name]  provided verbal consent during today's appointment for this provider to send a handout about triggers via e-mail. Overall,  Sheri [name] was receptive to today's appointment as evidenced by openness to sharing, responsiveness to feedback, and willingness to explore triggers for emotional eating and willingness to work toward the established SMART goal.  Mental Status Examination:  Appearance: neat Behavior: appropriate to circumstances Mood: neutral Affect: mood congruent Speech: WNL Eye Contact: appropriate Psychomotor Activity: WNL Gait: unable to assess Thought Process: linear, logical, and goal directed and no evidence or endorsement of suicidal, homicidal, and self-harm ideation, plan and intent  Thought Content/Perception: no hallucinations, delusions, bizarre thinking or behavior endorsed or observed Orientation: AAOx4 Memory/Concentration: intact Insight: fair Judgment: fair  Interventions:  Conducted a brief chart review Provided empathic reflections and validation Reviewed content from the previous session Provided positive reinforcement Employed supportive psychotherapy interventions to facilitate reduced distress and to improve coping skills with identified stressors Psychoeducation provided regarding triggers for emotional eating behaviors Goal setting  DSM-5 Diagnosis(es):  F50.89 Other Specified Feeding or Eating Disorder, Emotional and Binge Eating Behaviors and F90.9 Unspecified Attention-Deficit/Hyperactivity Disorder   Treatment Goal & Progress: During the initial appointment with this provider, the following treatment goal was established: increase coping skills. Sheri [name] has demonstrated progress in her goal as evidenced by increased awareness of hunger patterns.   Plan: The next appointment is scheduled for 08/30/2023 at 2pm, which will be via MyChart Video Visit. The next session will focus on working towards the established treatment goal.   Catherene Close,  PsyD

## 2023-08-22 NOTE — Progress Notes (Unsigned)
 SUBJECTIVE: Discussed the use of AI scribe software for clinical note transcription with the patient, who gave verbal consent to proceed.  Chief Complaint: Obesity  Interim History: She has maintained her weight since last visit.  Down 18 lbs overall TBW loss of 8.41% Sheri [name] is here to discuss her progress with her obesity treatment plan. She is on the Category 2 Plan and states she is following her eating plan approximately 50 % of the time. She states she is exercising doing yoga 60 minutes 1 times per week.  [name] is a 33 year old female who presents for follow-up of her obesity treatment plan.  She has been managing her obesity with metformin , Lomaira , and topiramate . She discontinued topiramate  two weeks ago due to side effects of grogginess and cognitive dulling. Flushing and heat sensations were experienced, which resolved after stopping Lomaira , as she was unable to refill it.  She has a history of prediabetes, vitamin D  deficiency, vitamin B12 deficiency, attention deficit disorder, and emotional eating behavior. She has been taking metformin  500 mg three times daily, which she increased last month. The increased metformin  seems to be helping with her weight management.  She describes a recent period of increased stress due to work and family responsibilities.This stress has led to some emotional eating and disruption of her routine.  Her blood pressure remains low and her heart rate has been stable. She has experienced some gastrointestinal discomfort with metformin , which she attributes to certain foods.  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 She has a three-year-old and a twelve-year-old child. Her sleep is variable, often disrupted by stress, but she is working on incorporating mindfulness techniques to improve it.  OBJECTIVE: Visit Diagnoses: Problem List Items Addressed This Visit     Prediabetes -  Primary   Relevant Medications   metFORMIN  (GLUCOPHAGE ) 500 MG tablet   Class 2 severe obesity with serious comorbidity and body mass index (BMI) of 36.0 to 36.9 in adult Sheri [name])   Relevant Medications   metFORMIN  (GLUCOPHAGE ) 500 MG tablet   Disorder of eating   Other Visit Diagnoses       Unspecified Attention-Deficit/Hyperactivity Disorder         BMI 33.0-33.9,adult Current BMI 33.63         Obesity Obesity management is ongoing. She has been on metformin , phentermine  (Lomaira ), and topiramate . Topiramate  was discontinued due to cognitive side effects, including grogginess and forgetfulness, which posed safety concerns. Phentermine  was also stopped due to flushing symptoms, but she is considering resuming it if increased metformin  is not effective.   Metformin  dose was increased last month, which may be contributing to weight management. She has experienced stress-related eating due to personal life stressors, including her husband's injury. She is working on dietary adjustments and stress management strategies. - Resume phentermine  once daily if she decides to restart it. She has Lomaira  and can resume if desires.  - Continue metformin  500 mg at breakfast and lunch, increase to 1000 mg at dinner for better evening hunger control. - Monitor for gastrointestinal side effects with metformin  and adjust dosage if necessary. - Encourage focus on protein intake and regular meals. - Discussed the importance of self-care and stress management, including yoga and mindfulness techniques. - Encourage outdoor activity and sunshine exposure to help with circadian rhythm and sleep.  Emotional eating behavior Emotional eating is influenced by recent stressors, including family responsibilities and work-related stress. She acknowledges stress  eating and is working on strategies to manage it, including focusing on protein intake and meal planning. - Encourage continued focus on protein intake and meal  planning to manage emotional eating. - Discussed strategies for stress management, including yoga, mindfulness, and self-care activities. - Encourage the use of meal prep services to facilitate healthy eating habits.  Prediabetes Prediabetes is being managed with metformin , which also aids in weight management. She is aware of dietary triggers that affect gastrointestinal symptoms and is adjusting her diet accordingly. Lab Results  Component Value Date   HGBA1C 5.6 05/25/2023   HGBA1C 5.7 (H) 09/15/2022   HGBA1C 6.3 01/06/2022   Lab Results  Component Value Date   LDLCALC 160 (H) 05/25/2023   CREATININE 0.85 05/25/2023   INSULIN   Date Value Ref Range Status  05/25/2023 15.9 2.6 - 24.9 uIU/mL Final  ]- Continue metformin  500 mg at breakfast and lunch, increase to 1000 mg at dinner for better evening hunger control. - Monitor for gastrointestinal side effects with metformin  and adjust dosage if necessary. - Continue metformin  as prescribed, with adjustments for gastrointestinal tolerance. - Encourage dietary modifications to manage blood sugar levels and gastrointestinal symptoms. Meds ordered this encounter  Medications   metFORMIN  (GLUCOPHAGE ) 500 MG tablet    Sig: 1 po with food at B, L, D    Dispense:  120 tablet    Refill:  0   Eating disorder/emotional eating Sheri [name] has had issues with stress/emotional eating. Currently this is moderately controlled. Overall mood is stable. Medication(s): Topiramate  stopped due to SE  Plan:  DC topiramate  due to SE.  Discussed some strategies to address emotional eating.   Attention deficit disorder She has attention deficit disorder and is under the care of a psychiatrist. Topiramate  was discontinued due to cognitive side effects, which impacted her ability to focus and perform daily tasks. - Continue psychiatric care and follow-up with the psychiatrist for management of attention deficit disorder. - Avoid topiramate  due to  cognitive side effects.  Vitals Temp: 98.3 F (36.8 C) BP: 111/76 Pulse Rate: 78 SpO2: 99 %   Anthropometric Measurements Height: 5\' 4"  (1.626 m) Weight: 196 lb (88.9 kg) BMI (Calculated): 33.63 Weight at Last Visit: 196 lb Weight Lost Since Last Visit: 0 Weight Gained Since Last Visit: 0 Starting Weight: 214 lb Total Weight Loss (lbs): 18 lb (8.165 kg) Peak Weight: 225 lb   Body Composition  Body Fat %: 39.3 % Fat Mass (lbs): 77.4 lbs Muscle Mass (lbs): 113.4 lbs Total Body Water (lbs): 80.6 lbs Visceral Fat Rating : 8   Other Clinical Data Fasting: no Labs: no Today's Visit #: 15 Starting Date: 09/15/22     ASSESSMENT AND PLAN:  Diet: Cherel is currently in the action stage of change. As such, her goal is to continue with weight loss efforts. She has agreed to Category 2 Plan.  Exercise: Kelseigh has been instructed to work up to a goal of 150 minutes of combined cardio and strengthening exercise per week for weight loss and overall health benefits.   Behavior Modification:  We discussed the following Behavioral Modification Strategies today: increasing lean protein intake, decreasing simple carbohydrates, increasing vegetables, increase H2O intake, increase high fiber foods, no skipping meals, avoiding temptations, and planning for success. We discussed various medication options to help Ivelise with her weight loss efforts and we both agreed to continue metformin  for primary indication of prediabetes and continue to work on nutritional and behavioral strategies to promote weight loss.  Aaron Aas  Return in about 4 weeks (around 09/20/2023).Aaron Aas She was informed of the importance of frequent follow up visits to maximize her success with intensive lifestyle modifications for her multiple health conditions.  Attestation Statements:   Reviewed by clinician on day of visit: allergies, medications, problem list, medical history, surgical history, family history, social  history, and previous encounter notes.   Time spent on visit including pre-visit chart review and post-visit care and charting was 39 minutes.    Randilyn Foisy, PA-C

## 2023-08-23 ENCOUNTER — Ambulatory Visit (INDEPENDENT_AMBULATORY_CARE_PROVIDER_SITE_OTHER): Admitting: Physician Assistant

## 2023-08-23 ENCOUNTER — Encounter (INDEPENDENT_AMBULATORY_CARE_PROVIDER_SITE_OTHER): Payer: Self-pay | Admitting: Physician Assistant

## 2023-08-23 VITALS — BP 111/76 | HR 78 | Temp 98.3°F | Ht 64.0 in | Wt 196.0 lb

## 2023-08-23 DIAGNOSIS — R7303 Prediabetes: Secondary | ICD-10-CM | POA: Diagnosis not present

## 2023-08-23 DIAGNOSIS — F5089 Other specified eating disorder: Secondary | ICD-10-CM

## 2023-08-23 DIAGNOSIS — E538 Deficiency of other specified B group vitamins: Secondary | ICD-10-CM

## 2023-08-23 DIAGNOSIS — E66812 Obesity, class 2: Secondary | ICD-10-CM

## 2023-08-23 DIAGNOSIS — F909 Attention-deficit hyperactivity disorder, unspecified type: Secondary | ICD-10-CM | POA: Diagnosis not present

## 2023-08-23 DIAGNOSIS — E559 Vitamin D deficiency, unspecified: Secondary | ICD-10-CM

## 2023-08-23 DIAGNOSIS — Z6833 Body mass index (BMI) 33.0-33.9, adult: Secondary | ICD-10-CM

## 2023-08-23 MED ORDER — METFORMIN HCL 500 MG PO TABS: ORAL_TABLET | ORAL | 0 refills | Status: DC

## 2023-08-30 ENCOUNTER — Telehealth (INDEPENDENT_AMBULATORY_CARE_PROVIDER_SITE_OTHER): Admitting: Psychology

## 2023-09-01 NOTE — Progress Notes (Unsigned)
 [facility] [address] [place] [address] Phone: [phone] Subjective:   Delwyn Filippo, am serving as a scribe for Dr. Ronnell Coins.  I'm seeing this patient by the request  of:  Panosh, Joaquim Muir, MD  CC:  Back and neck pain follow up  LKG:[id number]  Sheri [name] is a 33 y.o. female coming in with complaint of back and neck pain. OMT 06/01/2023. Patient states that her lower back is sore and feels stuck. R side worse than left.   L scapula and shoulder are also tight and painful.   Medications patient has been prescribed: Meloxicam   Taking:         Reviewed prior external information including notes and imaging from previsou exam, outside providers and external EMR if available.   As well as notes that were available from care everywhere and other healthcare systems.  Past medical history, social, surgical and family history all reviewed in electronic medical record.  No pertanent information unless stated regarding to the chief complaint.   Past Medical History:  Diagnosis Date   Alcohol addiction (HCC)    Anemia    Anxiety    Back pain    Bipolar depression (HCC)    under psych rx    Carpal tunnel syndrome    Chest pain    Depression    Drug addiction in remission [facility])    heroin   Enteritis due to Norovirus 05/18/2016   Heartburn    Hx of pulmonary embolus 02/13/2015   Hyperlipidemia    Hypokalemia 05/17/2016   IUD (intrauterine device) in place 02/13/2015   Joint pain    Mild mitral regurgitation    Mild tricuspid regurgitation    Mitral regurgitation 06/13/2018   Near syncope 05/16/2016   a. felt due to norovirus.   Nonallopathic lesion of cervical region 09/01/2018   Nonallopathic lesion of rib cage 09/01/2018   Obesity    Orthostatic hypotension 05/18/2016   Palpitations    PCOS (polycystic ovarian syndrome) 02/13/2015   Prediabetes    Pulmonary embolism (HCC) 2011   neg heme evaluation  felt  to be from ocps    Recurrent upper respiratory infection (URI)    Scapular dyskinesis 09/01/2018   Smoker 12/30/2016   Syncope and collapse 05/16/2016   Thoracic outlet syndrome of left thoracic outlet 02/15/2018   Thoracic radiculopathy due to degenerative joint disease of spine 10/13/2016   CT and XR show at least 3 levels of DDD of thoracic spine  I suspect this is related to both the scoliosis and the history of dance   Urticaria     Allergies  Allergen Reactions   Bupropion Itching and Other (See Comments)     Review of Systems:  No headache, visual changes, nausea, vomiting, diarrhea, constipation, dizziness, abdominal pain, skin rash, fevers, chills, night sweats, weight loss, swollen lymph nodes, body aches, joint swelling, chest pain, shortness of breath, mood changes. POSITIVE muscle aches  Objective  Blood pressure 116/78, pulse 83, height 5\' 4"  (1.626 m), weight 200 lb (90.7 kg), SpO2 98%.   General: No apparent distress alert and oriented x3 mood and affect normal, dressed appropriately.  HEENT: Pupils equal, extraocular movements intact  Respiratory: Patient's speak in full sentences and does not appear short of breath  Cardiovascular: No lower extremity edema, non tender, no erythema  Gait normal  MSK:  Back does have loss of lordosis  Positive faber on right significant tightness noted in the  parascapular area on the left side.  Osteopathic findings  C3 flexed rotated and side bent left C7 flexed rotated and side bent left T3 extended rotated and side bent left inhaled rib T9 extended rotated and side bent left L2 flexed rotated and side bent right L4 flexed rotated and side bent right Sacrum right on right       Assessment and Plan:  SI (sacroiliac) joint dysfunction More of the lower back tightness but also had some scapular dyskinesis on the left side.  Discussed icing regimen and home exercises, which activities to do and which ones to avoid.   Increase activity slowly over the course of next several weeks.  Discussed icing regimen.  Follow-up again in 6 to 8 weeks otherwise.    Nonallopathic problems  Decision today to treat with OMT was based on Physical Exam  After verbal consent patient was treated with HVLA, ME, FPR techniques in cervical, rib, thoracic, lumbar, and sacral  areas  Patient tolerated the procedure well with improvement in symptoms  Patient given exercises, stretches and lifestyle modifications  See medications in patient instructions if given  Patient will follow up in 4-8 weeks     The above documentation has been reviewed and is accurate and complete Sheri Mohiuddin M Ahyana Skillin, DO         Note: This dictation was prepared with Dragon dictation along with smaller phrase technology. Any transcriptional errors that result from this process are unintentional.

## 2023-09-02 ENCOUNTER — Encounter: Payer: Self-pay | Admitting: Family Medicine

## 2023-09-02 ENCOUNTER — Ambulatory Visit: Admitting: Family Medicine

## 2023-09-02 VITALS — BP 116/78 | HR 83 | Ht 64.0 in | Wt 200.0 lb

## 2023-09-02 DIAGNOSIS — M9901 Segmental and somatic dysfunction of cervical region: Secondary | ICD-10-CM | POA: Diagnosis not present

## 2023-09-02 DIAGNOSIS — M9904 Segmental and somatic dysfunction of sacral region: Secondary | ICD-10-CM

## 2023-09-02 DIAGNOSIS — M9908 Segmental and somatic dysfunction of rib cage: Secondary | ICD-10-CM | POA: Diagnosis not present

## 2023-09-02 DIAGNOSIS — M9903 Segmental and somatic dysfunction of lumbar region: Secondary | ICD-10-CM

## 2023-09-02 DIAGNOSIS — M533 Sacrococcygeal disorders, not elsewhere classified: Secondary | ICD-10-CM | POA: Diagnosis not present

## 2023-09-02 DIAGNOSIS — M9902 Segmental and somatic dysfunction of thoracic region: Secondary | ICD-10-CM

## 2023-09-02 NOTE — Assessment & Plan Note (Signed)
 More of the lower back tightness but also had some scapular dyskinesis on the left side.  Discussed icing regimen and home exercises, which activities to do and which ones to avoid.  Increase activity slowly over the course of next several weeks.  Discussed icing regimen.  Follow-up again in 6 to 8 weeks otherwise.

## 2023-09-02 NOTE — Patient Instructions (Signed)
 Great to see you See me in 6-8 weeks

## 2023-09-27 ENCOUNTER — Encounter (INDEPENDENT_AMBULATORY_CARE_PROVIDER_SITE_OTHER): Payer: Self-pay | Admitting: Family Medicine

## 2023-09-27 ENCOUNTER — Ambulatory Visit (INDEPENDENT_AMBULATORY_CARE_PROVIDER_SITE_OTHER): Admitting: Family Medicine

## 2023-09-27 ENCOUNTER — Telehealth (INDEPENDENT_AMBULATORY_CARE_PROVIDER_SITE_OTHER): Payer: Self-pay

## 2023-09-27 VITALS — BP 108/67 | HR 84 | Temp 98.5°F | Ht 64.0 in | Wt 197.0 lb

## 2023-09-27 DIAGNOSIS — Z6833 Body mass index (BMI) 33.0-33.9, adult: Secondary | ICD-10-CM

## 2023-09-27 DIAGNOSIS — E66812 Obesity, class 2: Secondary | ICD-10-CM

## 2023-09-27 DIAGNOSIS — R7303 Prediabetes: Secondary | ICD-10-CM

## 2023-09-27 DIAGNOSIS — E782 Mixed hyperlipidemia: Secondary | ICD-10-CM

## 2023-09-27 DIAGNOSIS — E559 Vitamin D deficiency, unspecified: Secondary | ICD-10-CM | POA: Diagnosis not present

## 2023-09-27 MED ORDER — VITAMIN D (ERGOCALCIFEROL) 1.25 MG (50000 UNIT) PO CAPS: 50000.0000 [IU] | ORAL_CAPSULE | ORAL | 0 refills | Status: DC

## 2023-09-27 MED ORDER — METFORMIN HCL 500 MG PO TABS: ORAL_TABLET | ORAL | 1 refills | Status: DC

## 2023-09-27 MED ORDER — TIRZEPATIDE 2.5 MG/0.5ML ~~LOC~~ SOAJ
2.5000 mg | SUBCUTANEOUS | 0 refills | Status: DC
Start: 2023-09-27 — End: 2023-10-18

## 2023-09-27 MED ORDER — METFORMIN HCL 500 MG PO TABS: ORAL_TABLET | ORAL | 0 refills | Status: DC

## 2023-09-27 NOTE — Telephone Encounter (Addendum)
 Prior auth started for Mounjaro 2.5 mg for Obesity and prediabetes. Awaiting questions.

## 2023-09-27 NOTE — Progress Notes (Addendum)
 Sheri [name], D.O.  ABFM, ABOM Specializing in Clinical Bariatric Medicine  Office located at: [address] [place], [place]  [address]   Assessment and Plan:   Medications Discontinued During This Encounter  Medication Reason   Vitamin D , Ergocalciferol , (DRISDOL ) 1.25 MG (50000 UNIT) CAPS capsule Reorder   metFORMIN  (GLUCOPHAGE ) 500 MG tablet Reorder   metFORMIN  (GLUCOPHAGE ) 500 MG tablet      Meds ordered this encounter  Medications   Vitamin D , Ergocalciferol , (DRISDOL ) 1.25 MG (50000 UNIT) CAPS capsule    Sig: Take 1 capsule (50,000 Units total) by mouth every 7 (seven) days.    Dispense:  4 capsule    Refill:  0   DISCONTD: metFORMIN  (GLUCOPHAGE ) 500 MG tablet    Sig: 1 po with food at B, L, D    Dispense:  120 tablet    Refill:  0   metFORMIN  (GLUCOPHAGE ) 500 MG tablet    Sig: 1 po with food at B, L, and 2 tabs at D    Dispense:  120 tablet    Refill:  1   tirzepatide (MOUNJARO) 2.5 MG/0.5ML Pen    Sig: Inject 2.5 mg into the skin once a week.    Dispense:  2 mL    Refill:  0   FOR THE DISEASE OF OBESITY:  BMI 33.0-33.9,adult Current BMI 33.8 Obesity with starting BMI of 36.8 Assessment & Plan: Since last office visit on 08/23/2023 patient's  Muscle mass has increased by 1.2 lb. Fat mass has decreased by 0.8 lb. Total body water has decreased by 0.4 lb.  Counseling done on how various foods will affect these numbers and how to maximize success  Total lbs lost to date: 17 lbs  Total weight loss percentage to date: 7.94%    Recommended Dietary Goals Sheri [name] is currently in the action stage of change. As such, her goal is to continue weight management plan.  She has agreed to: continue current plan   Behavioral Intervention We discussed the following today: continue to work on maintaining a reduced calorie state, getting the recommended amount of protein, incorporating whole foods, making healthy choices, staying well hydrated and practicing  mindfulness when eating.  Additional resources provided today: None  Evidence-based interventions for health behavior change were utilized today including the discussion of self monitoring techniques, problem-solving barriers and SMART goal setting techniques.   Regarding patient's less desirable eating habits and patterns, we employed the technique of small changes.   Pt will specifically work on: n/a    Recommended Physical Activity Goals Sheri [name] has been advised to work up to 300-450 minutes of moderate intensity aerobic activity a week and strengthening exercises 2-3 times per week for cardiovascular health, weight loss maintenance and preservation of muscle mass.   She has agreed to START doing some form of exercise 20 minutes, 3 days a week.   Pharmacotherapy We both agreed to: ( See Pre-DM note as well.) Patient has not responded well to metformin  for her health goals and has had little improvement in her insulin  resistance.  She would greatly benefit from Monjauro with prediabetes / insulin  resistance as the primary indication     ASSOCIATED CONDITIONS ADDRESSED TODAY:   Prediabetes Assessment & Plan: Lab Results  Component Value Date   HGBA1C 5.6 05/25/2023   HGBA1C 5.7 (H) 09/15/2022   HGBA1C 6.3 01/06/2022   INSULIN  15.9 05/25/2023   INSULIN  20.1 09/15/2022    On Metformin  500 mg at breakfast and lunch  and 1000 mg at dinner w/o gastrointestinal issues. Since discontinuing Topiramate  and Lomaira  several weeks ago, pt endorses experiencing increased hunger and "food noise".  Pt states her insurance covers Mounjaro for her existing medical conditions as she spoke to them recently and requests I write for it today.  Continue Metformin  therapy. Additionally, pt is agreeable to STARTING Mounjaro 2.5 mg wkly. Patient denies a personal history of pancreatitis;  and denies a family history of medullary thyroid  carcinoma or multiple endocrine neoplasia type II.  Potential risks/  benefits of the medication were explained to patient. Explained that the more she eats "off-plan", the more likely for her to have side effects of the drug.  All questions were answered appropriately and alternative treatment options were discussed; patient wishes to move forward with the medication at this time.  Even though she states her insurance told her it's covered, I explained I am doubtful it actually will be covered for her existing medical conditions.    Mixed hyperlipidemia Assessment & Plan: Lab Results  Component Value Date   CHOL 237 (H) 05/25/2023   HDL 50 05/25/2023   LDLCALC 160 (H) 05/25/2023   LDLDIRECT 150.0 02/13/2015   TRIG 151 (H) 05/25/2023   CHOLHDL 4.7 (H) 05/25/2023   Diet/life style approach.   She will be getting labs through cardiology around 7/16. Continue to work on nutrition plan -decreasing simple carbohydrates, increasing lean proteins, decreasing saturated fats and cholesterol , avoiding trans fats and exercise as able to promote weight loss, improve lipids and decrease cardiovascular risks.     Vitamin D  deficiency Assessment & Plan: Lab Results  Component Value Date   VD25OH 56.9 05/25/2023   VD25OH 32.6 09/15/2022   Doing well on ergocalciferol  50,000 units wkly. Continue regimen. Recheck; future.      Follow up:   Return 10/18/2023 at 10:40 AM. She was informed of the importance of frequent follow up visits to maximize her success with intensive lifestyle modifications for her multiple health conditions.    Subjective:   Chief complaint: Obesity Sheri [name] is here to discuss her progress with her obesity treatment plan. She is on the Category 2 Plan.  She states she is not exercising.  Interval History:  Sheri [name] is here for a follow up office visit. Since last OV on 5/52025, Sheri [name] is up 1 lb. States she is following her eating plan approximately 45-50% of the time .Since discontinuing Topiramate  and Lomaira  several  weeks ago, pt endorses experiencing increased hunger and "food noise". Pt states her insurance covers Mounjaro and requests I write for it.   Pharmacotherapy for weight loss: She is currently taking Metformin  500 mg  at breakfast and lunch and 1000 mg at dinner. Was unable to tolerate Topiramate  (experienced grogginess and cognitive dulling). Was also unable to tolerate Lomaira  (experienced flushing and heat sensations)  Review of Systems:  Pertinent positives were addressed with patient today.  Reviewed by clinician on day of visit: allergies, medications, problem list, medical history, surgical history, family history, social history, and previous encounter notes.    Weight Summary and Biometrics   Weight Lost Since Last Visit: 0lb  Weight Gained Since Last Visit: 1lb   Vitals Temp: 98.5 F (36.9 C) BP: 108/67 Pulse Rate: 84 SpO2: 98 %   Anthropometric Measurements Height: 5\' 4"  (1.626 m) Weight: 197 lb (89.4 kg) BMI (Calculated): 33.8 Weight at Last Visit: 196lb Weight Lost Since Last Visit: 0lb Weight Gained Since Last Visit: 1lb Starting Weight: 214lb Total Weight  Loss (lbs): 17 lb (7.711 kg) Peak Weight: 225lb   Body Composition  Body Fat %: 38.8 % Fat Mass (lbs): 76.6 lbs Muscle Mass (lbs): 114.6 lbs Total Body Water (lbs): 79 lbs Visceral Fat Rating : 8   Other Clinical Data Fasting: No Labs: No Today's Visit #: 16 Starting Date: 09/15/22     Objective:   PHYSICAL EXAM: Blood pressure 108/67, pulse 84, temperature 98.5 F (36.9 C), height 5\' 4"  (1.626 m), weight 197 lb (89.4 kg), SpO2 98%. Body mass index is 33.81 kg/m.  General: she is overweight, cooperative and in no acute distress. PSYCH: Has normal mood, affect and thought process.   HEENT: EOMI, sclerae are anicteric. Lungs: Normal breathing effort, no conversational dyspnea. Extremities: Moves * 4 Neurologic: A and O * 3, good insight  DIAGNOSTIC DATA REVIEWED: BMET    Component  Value Date/Time   NA 139 05/25/2023 0857   K 4.2 05/25/2023 0857   CL 102 05/25/2023 0857   CO2 21 05/25/2023 0857   GLUCOSE 84 05/25/2023 0857   GLUCOSE 73 01/06/2022 1518   BUN 13 05/25/2023 0857   CREATININE 0.85 05/25/2023 0857   CALCIUM 9.6 05/25/2023 0857   GFRNONAA >60 05/17/2016 0453   GFRAA >60 05/17/2016 0453   Lab Results  Component Value Date   HGBA1C 5.6 05/25/2023   HGBA1C 5.5 02/13/2015   Lab Results  Component Value Date   INSULIN  15.9 05/25/2023   INSULIN  20.1 09/15/2022   Lab Results  Component Value Date   TSH 2.400 09/15/2022   CBC    Component Value Date/Time   WBC 7.7 09/15/2022 0939   WBC 9.5 01/06/2022 1518   RBC 4.52 09/15/2022 0939   RBC 4.61 01/06/2022 1518   HGB 12.9 09/15/2022 0939   HCT 39.2 09/15/2022 0939   PLT 334 09/15/2022 0939   MCV 87 09/15/2022 0939   MCH 28.5 09/15/2022 0939   MCH 29.6 12/12/2019 0600   MCHC 32.9 09/15/2022 0939   MCHC 33.2 01/06/2022 1518   RDW 13.2 09/15/2022 0939   Iron  Studies    Component Value Date/Time   IRON  54 06/12/2020 1045   FERRITIN 12.1 06/12/2020 1045   IRONPCTSAT 11.7 (L) 06/12/2020 1045   Lipid Panel     Component Value Date/Time   CHOL 237 (H) 05/25/2023 0857   TRIG 151 (H) 05/25/2023 0857   HDL 50 05/25/2023 0857   CHOLHDL 4.7 (H) 05/25/2023 0857   CHOLHDL 4 06/12/2020 1045   VLDL 27.8 06/12/2020 1045   LDLCALC 160 (H) 05/25/2023 0857   LDLDIRECT 150.0 02/13/2015 1449   Hepatic Function Panel     Component Value Date/Time   PROT 7.8 05/25/2023 0857   ALBUMIN 4.5 05/25/2023 0857   AST 12 05/25/2023 0857   ALT 14 05/25/2023 0857   ALKPHOS 68 05/25/2023 0857   BILITOT 0.4 05/25/2023 0857   BILIDIR 0.1 01/06/2022 1518      Component Value Date/Time   TSH 2.400 09/15/2022 0939   Nutritional Lab Results  Component Value Date   VD25OH 56.9 05/25/2023   VD25OH 32.6 09/15/2022     Attestations:   I, Special Puri , acting as a Stage manager for [employer],  DO., have compiled all relevant documentation for today's office visit on behalf of Sheri Sensor, DO, while in the presence of [employer], DO.  I have reviewed the above documentation for accuracy and completeness, and I agree with the above. Sheri [name], D.O.  The 21st Century  Cures Act was signed into law in 2016 which includes the topic of electronic health records.  This provides immediate access to information in MyChart.  This includes consultation notes, operative notes, office notes, lab results and pathology reports.  If you have any questions about what you read please let us  know at your next visit so we can discuss your concerns and take corrective action if need be.  We are right here with you.

## 2023-09-27 NOTE — Telephone Encounter (Signed)
Prior auth has been submitted. Awaiting response.

## 2023-09-29 NOTE — Telephone Encounter (Signed)
 Response from Cover my meds.  Mounjaro has been denied.

## 2023-09-30 NOTE — Progress Notes (Signed)
 [name] [address] [place] [address] Phone: [phone]   Assessment and Plan:     1. Chronic left shoulder pain 2. Subacromial bursitis of left shoulder joint  -Chronic with exacerbation, subsequent visit -Recurrence of left shoulder pain x 3 weeks consistent with recurrent subacromial bursitis without specific MOI. - Patient has not had significant improvement despite intermittent meloxicam  use, relative rest.  Elected to not repeat prescription NSAID course at this time - Patient elected for subacromial CSI.  Tolerated well per note below.  Patient has history of vasovagal reactions, so patient was laid down for procedure, allowed to slowly go from lying to sitting to standing position, and was asymptomatic at the time of leaving office - Continue Tylenol /meloxicam  as needed for pain relief - Start HEP targeting rotator cuff  Procedure: Subacromial Injection Side: Left  Risks explained and consent was given verbally. The site was cleaned with alcohol prep. A steroid injection was performed from posterior approach using 2mL of 1% lidocaine  without epinephrine and 1mL of kenalog  40mg /ml. This was well tolerated and resulted in symptomatic relief.  Needle was removed, hemostasis achieved, and post injection instructions were explained.   Pt was advised to call or return to clinic if these symptoms worsen or fail to improve as anticipated.   Pertinent previous records reviewed include none  Follow Up: Patient has scheduled follow-up with Dr. Felipe [name] in 3 weeks.  Recommend keeping this appointment.  May follow-up with me sooner if needed and could consider x-ray versus ultrasound versus physical therapy   Subjective:   I, Sheri [name] am a scribe for Dr. Cleora Daft.   Chief Complaint: Left shoulder pain   HPI:   10/01/23 Patient is a 33 year old female presenting to [facility] with left shoulder pain. Patient  sees Dr. Felipe [name] and last saw him on 09/02/23 for OMT. Pain has woken her up at night but not currently.   Duration?about 3 weeks Did you have an Injury to cause this pain?no Taking Medication for pain?ibu, tylenol  Numbness or Tingling?yes Does the pain Radiate? yes Altered gait or use?yes ROM/ impairment of movement?yes   Relevant Historical Information:   None pertinent  Additional pertinent review of systems negative.   Current Outpatient Medications:    albuterol  (PROAIR  HFA) 108 (90 Base) MCG/ACT inhaler, Inhale 2 puffs into the lungs every 6 (six) hours as needed for wheezing or shortness of breath. (Patient not taking: Reported on 09/27/2023), Disp: 1 each, Rfl: 1   Albuterol -Budesonide (AIRSUPRA ) 90-80 MCG/ACT AERO, Inhale 2 puffs into the lungs every 4 (four) hours as needed (coughing, wheezing, chest tightness). Do not exceed 12 puffs in 24 hours., Disp: 10.7 g, Rfl: 2   aspirin EC 81 MG tablet, Take 81 mg by mouth daily. Swallow whole., Disp: , Rfl:    busPIRone  (BUSPAR ) 15 MG tablet, Take 1 tablet (15 mg total) by mouth 2 (two) times daily., Disp: 180 tablet, Rfl: 0   cetirizine (ZYRTEC) 10 MG tablet, Take 10 mg by mouth daily., Disp: , Rfl:    cyanocobalamin  (VITAMIN B12) 500 MCG tablet, Take 1 tablet (500 mcg total) by mouth daily., Disp: , Rfl:    fluticasone  furoate-vilanterol (BREO ELLIPTA ) 100-25 MCG/ACT AEPB, Inhale 1 puff into the lungs daily. Rinse mouth after each use., Disp: 30 each, Rfl: 5   lamoTRIgine  (LAMICTAL ) 100 MG tablet, Take 1 tablet (100 mg total) by mouth daily., Disp: 90 tablet, Rfl: 0   meloxicam  (  MOBIC ) 15 MG tablet, TAKE 1 TABLET (15 MG TOTAL) BY MOUTH DAILY., Disp: 90 tablet, Rfl: 0   metFORMIN  (GLUCOPHAGE ) 500 MG tablet, 1 po with food at B, L, and 2 tabs at D, Disp: 120 tablet, Rfl: 1   ondansetron  (ZOFRAN -ODT) 4 MG disintegrating tablet, Take 1 tablet (4 mg total) by mouth every 8 (eight) hours as needed for nausea or vomiting., Disp: 20 tablet, Rfl:  0   paragard intrauterine copper IUD IUD, 1 each by Intrauterine route once., Disp: , Rfl:    Phentermine  HCl (LOMAIRA ) 8 MG TABS, Take 1 tablet (8 mg total) by mouth daily. (Patient not taking: Reported on 08/16/2023), Disp: 30 tablet, Rfl: 0   sertraline  (ZOLOFT ) 50 MG tablet, Take 1 tablet (50 mg total) by mouth daily., Disp: 90 tablet, Rfl: 0   tirzepatide  (MOUNJARO ) 2.5 MG/0.5ML Pen, Inject 2.5 mg into the skin once a week., Disp: 2 mL, Rfl: 0   topiramate  (TOPAMAX ) 50 MG tablet, Take 1 tablet (50 mg total) by mouth 2 (two) times daily. (Patient not taking: Reported on 08/16/2023), Disp: 60 tablet, Rfl: 0   Vitamin D , Ergocalciferol , (DRISDOL ) 1.25 MG (50000 UNIT) CAPS capsule, Take 1 capsule (50,000 Units total) by mouth every 7 (seven) days., Disp: 4 capsule, Rfl: 0   Objective:     Vitals:   10/01/23 0834  BP: 100/60  Pulse: 83  SpO2: 97%  Weight: 203 lb (92.1 kg)  Height: 5' 4 (1.626 m)      Body mass index is 34.84 kg/m.    Physical Exam:    Gen: Appears well, nad, nontoxic and pleasant Neuro:sensation intact, strength is 5/5 with df/pf/inv/ev, muscle tone wnl Skin: no suspicious lesion or defmority Psych: A&O, appropriate mood and affect  Left shoulder:  No deformity, swelling or muscle wasting No scapular winging FF 180, abd 180, int 0, ext 90 NTTP over the [place], clavicle, ac, coracoid, biceps groove, humerus, deltoid, trapezius, cervical spine Positive Hawkins, empty can,   Neg neer,  obriens, crossarm, subscap liftoff, speeds Neg ant drawer, sulcus sign, apprehension Negative Spurling's test bilat FROM of neck    Electronically signed by:  Marshall Skeeter D.[name] 8:49 AM 10/01/23

## 2023-10-01 ENCOUNTER — Ambulatory Visit: Admitting: Sports Medicine

## 2023-10-01 VITALS — BP 100/60 | HR 83 | Ht 64.0 in | Wt 203.0 lb

## 2023-10-01 DIAGNOSIS — M25512 Pain in left shoulder: Secondary | ICD-10-CM

## 2023-10-01 DIAGNOSIS — G8929 Other chronic pain: Secondary | ICD-10-CM | POA: Diagnosis not present

## 2023-10-01 DIAGNOSIS — M7552 Bursitis of left shoulder: Secondary | ICD-10-CM | POA: Diagnosis not present

## 2023-10-01 NOTE — Patient Instructions (Addendum)
 Good to see you  Shoulder injection given today  Shoulder exercises given today  Keep follow up with Dr. Felipe Horton

## 2023-10-08 NOTE — Progress Notes (Signed)
 [name] [address] [place] [address] Phone: [phone] Subjective:    I'm seeing this patient by the request  of:  Panosh, [name], MD  CC: Back and neck pain follow-up  [name]  [name] is a 33 y.o. female coming in with complaint of back and neck pain. OMT on 09/02/2023. Patient states she is better   Medications patient has been prescribed:   Taking:         Reviewed prior external information including notes and imaging from previsou exam, outside providers and external EMR if available.   As well as notes that were available from care everywhere and other healthcare systems.  Past medical history, social, surgical and family history all reviewed in electronic medical record.  No pertanent information unless stated regarding to the chief complaint.   Past Medical History:  Diagnosis Date   Alcohol addiction (HCC)    Anemia    Anxiety    Back pain    Bipolar depression (HCC)    under psych rx    Carpal tunnel syndrome    Chest pain    Depression    Drug addiction in remission [facility])    heroin   Enteritis due to Norovirus 05/18/2016   Heartburn    Hx of pulmonary embolus 02/13/2015   Hyperlipidemia    Hypokalemia 05/17/2016   IUD (intrauterine device) in place 02/13/2015   Joint pain    Mild mitral regurgitation    Mild tricuspid regurgitation    Mitral regurgitation 06/13/2018   Near syncope 05/16/2016   a. felt due to norovirus.   Nonallopathic lesion of cervical region 09/01/2018   Nonallopathic lesion of rib cage 09/01/2018   Obesity    Orthostatic hypotension 05/18/2016   Palpitations    PCOS (polycystic ovarian syndrome) 02/13/2015   Prediabetes    Pulmonary embolism (HCC) 2011   neg heme evaluation  felt to be from ocps    Recurrent upper respiratory infection (URI)    Scapular dyskinesis 09/01/2018   Smoker 12/30/2016   Syncope and collapse 05/16/2016   Thoracic outlet syndrome  of left thoracic outlet 02/15/2018   Thoracic radiculopathy due to degenerative joint disease of spine 10/13/2016   CT and XR show at least 3 levels of DDD of thoracic spine  I suspect this is related to both the scoliosis and the history of dance   Urticaria     Allergies  Allergen Reactions   Bupropion Itching and Other (See Comments)     Review of Systems:  No headache, visual changes, nausea, vomiting, diarrhea, constipation, dizziness, abdominal pain, skin rash, fevers, chills, night sweats, weight loss, swollen lymph nodes, body aches, joint swelling, chest pain, shortness of breath, mood changes. POSITIVE muscle aches  Objective  There were no vitals taken for this visit.   General: No apparent distress alert and oriented x3 mood and affect normal, dressed appropriately.  HEENT: Pupils equal, extraocular movements intact  Respiratory: Patient's speak in full sentences and does not appear short of breath  Cardiovascular: No lower extremity edema, non tender, no erythema  Gait MSK:  Back increasing discomfort in the paraspinal musculature of the lumbar spine.  Patient has more pain over the sacroiliac joint than previously seen. Tightness with Deri right greater than left.  Neck exam does have some loss of lordosis.  Osteopathic findings  C2 flexed rotated and side bent right C7 flexed rotated and side bent right T3 extended rotated and side  bent right inhaled rib T7 extended rotated and side bent left L2 flexed rotated and side bent right L3 F RS left  Sacrum right on right     Assessment and Plan:  No problem-specific Assessment & Plan notes found for this encounter.    Nonallopathic problems  Decision today to treat with OMT was based on Physical Exam  After verbal consent patient was treated with HVLA, ME, FPR techniques in cervical, rib, thoracic, lumbar, and sacral  areas  Patient tolerated the procedure well with improvement in symptoms  Patient given  exercises, stretches and lifestyle modifications  See medications in patient instructions if given  Patient will follow up in 4-8 weeks     The above documentation has been reviewed and is accurate and complete [name] M [name], DO         Note: This dictation was prepared with Dragon dictation along with smaller phrase technology. Any transcriptional errors that result from this process are unintentional.

## 2023-10-14 ENCOUNTER — Ambulatory Visit: Admitting: Family Medicine

## 2023-10-14 VITALS — BP 122/82 | HR 78 | Ht 64.0 in | Wt 202.0 lb

## 2023-10-14 DIAGNOSIS — G2589 Other specified extrapyramidal and movement disorders: Secondary | ICD-10-CM

## 2023-10-14 DIAGNOSIS — M9904 Segmental and somatic dysfunction of sacral region: Secondary | ICD-10-CM

## 2023-10-14 DIAGNOSIS — M9902 Segmental and somatic dysfunction of thoracic region: Secondary | ICD-10-CM

## 2023-10-14 DIAGNOSIS — M9901 Segmental and somatic dysfunction of cervical region: Secondary | ICD-10-CM

## 2023-10-14 DIAGNOSIS — M9908 Segmental and somatic dysfunction of rib cage: Secondary | ICD-10-CM

## 2023-10-14 DIAGNOSIS — E538 Deficiency of other specified B group vitamins: Secondary | ICD-10-CM

## 2023-10-14 DIAGNOSIS — M9903 Segmental and somatic dysfunction of lumbar region: Secondary | ICD-10-CM

## 2023-10-14 DIAGNOSIS — M2550 Pain in unspecified joint: Secondary | ICD-10-CM

## 2023-10-14 MED ORDER — CYANOCOBALAMIN 1000 MCG/ML IJ SOLN
1000.0000 ug | Freq: Once | INTRAMUSCULAR | Status: AC
  Administered 2023-10-14: 1000 ug via INTRAMUSCULAR

## 2023-10-14 NOTE — Patient Instructions (Addendum)
 B 12 injection today  2 month follow up

## 2023-10-15 ENCOUNTER — Encounter: Payer: Self-pay | Admitting: Family Medicine

## 2023-10-15 DIAGNOSIS — E538 Deficiency of other specified B group vitamins: Secondary | ICD-10-CM | POA: Insufficient documentation

## 2023-10-15 NOTE — Assessment & Plan Note (Signed)
Injection given today. 

## 2023-10-15 NOTE — Assessment & Plan Note (Signed)
 Scapular dyskinesis  Discussed HEP  Core strengthening  RTC in 6-8 weeks

## 2023-10-18 ENCOUNTER — Ambulatory Visit (INDEPENDENT_AMBULATORY_CARE_PROVIDER_SITE_OTHER): Admitting: Family Medicine

## 2023-10-18 ENCOUNTER — Encounter (INDEPENDENT_AMBULATORY_CARE_PROVIDER_SITE_OTHER): Payer: Self-pay | Admitting: Family Medicine

## 2023-10-18 VITALS — BP 119/80 | HR 74 | Temp 98.7°F | Ht 64.0 in | Wt 200.0 lb

## 2023-10-18 DIAGNOSIS — E66812 Obesity, class 2: Secondary | ICD-10-CM

## 2023-10-18 DIAGNOSIS — Z6834 Body mass index (BMI) 34.0-34.9, adult: Secondary | ICD-10-CM | POA: Diagnosis not present

## 2023-10-18 DIAGNOSIS — R7303 Prediabetes: Secondary | ICD-10-CM

## 2023-10-18 DIAGNOSIS — E559 Vitamin D deficiency, unspecified: Secondary | ICD-10-CM

## 2023-10-18 MED ORDER — VITAMIN D (ERGOCALCIFEROL) 1.25 MG (50000 UNIT) PO CAPS: 50000.0000 [IU] | ORAL_CAPSULE | ORAL | 0 refills | Status: DC

## 2023-10-18 MED ORDER — TIRZEPATIDE-WEIGHT MANAGEMENT 2.5 MG/0.5ML ~~LOC~~ SOLN: 2.5000 mg | SUBCUTANEOUS | 0 refills | Status: DC

## 2023-10-18 MED ORDER — METFORMIN HCL 500 MG PO TABS: ORAL_TABLET | ORAL | 1 refills | Status: DC

## 2023-10-18 NOTE — Progress Notes (Signed)
 Sheri [name], D.O.  ABFM, ABOM Specializing in Clinical Bariatric Medicine  Office located at: [address] [place], [place]  [address]   Assessment and Plan:  No orders of the defined types were placed in this encounter.  Medications Discontinued During This Encounter  Medication Reason   Phentermine  HCl (LOMAIRA ) 8 MG TABS Side effect (s)   tirzepatide  (MOUNJARO ) 2.5 MG/0.5ML Pen Cost of medication   topiramate  (TOPAMAX ) 50 MG tablet Side effect (s)   Phentermine  HCl (LOMAIRA ) 8 MG TABS Side effect (s)   Vitamin D , Ergocalciferol , (DRISDOL ) 1.25 MG (50000 UNIT) CAPS capsule Reorder   metFORMIN  (GLUCOPHAGE ) 500 MG tablet Reorder   tirzepatide  (ZEPBOUND ) 2.5 MG/0.5ML injection vial     Meds ordered this encounter  Medications   Vitamin D , Ergocalciferol , (DRISDOL ) 1.25 MG (50000 UNIT) CAPS capsule    Sig: Take 1 capsule (50,000 Units total) by mouth every 7 (seven) days.    Dispense:  4 capsule    Refill:  0   metFORMIN  (GLUCOPHAGE ) 500 MG tablet    Sig: 1 po tid with meals    Dispense:  90 tablet    Refill:  1   DISCONTD: tirzepatide  (ZEPBOUND ) 2.5 MG/0.5ML injection vial    Sig: Inject 2.5 mg into the skin once a week.    Dispense:  2 mL    Refill:  0   tirzepatide  (ZEPBOUND ) 2.5 MG/0.5ML injection vial    Sig: Inject 2.5 mg into the skin once a week.    Dispense:  2 mL    Refill:  0      FOR THE DISEASE OF OBESITY: Class 2 severe obesity with serious comorbidity and body mass index (BMI) of 36.0 to 36.9 in adult, unspecified obesity type (HCC) BMI 34.0-34.9,adult current 34.33 Assessment & Plan: Since last office visit on 09/27/23 patient's muscle mass has increased by 0.6 lbs. Fat mass has increased by 3.8 lbs. Maintained total body water weight; no changes. Counseling done on how various foods will affect these numbers and how to maximize success  Total lbs lost to date: 14 lbs Total weight loss percentage to date: 6.54    Recommended Dietary  Goals Sheri [name] is currently in the action stage of change. As such, her goal is to continue weight management plan.  She has agreed to: continue current plan   Behavioral Intervention We discussed the following today: increasing lean protein intake to established goals, decreasing simple carbohydrates , avoiding skipping meals, increasing water intake , work on tracking and journaling calories using tracking application, and keeping healthy foods at home  Additional resources provided today: Handout on Daily Food Journaling Log  Evidence-based interventions for health behavior change were utilized today including the discussion of self monitoring techniques, problem-solving barriers and SMART goal setting techniques.   Regarding patient's less desirable eating habits and patterns, we employed the technique of small changes.   Pt will specifically work on: Prioritizing her protein intake, aiming for 85+++ g of protein.    Recommended Physical Activity Goals Sheri [name] has been advised to work up to 300-450 minutes of moderate intensity aerobic activity a week and strengthening exercises 2-3 times per week for cardiovascular health, weight loss maintenance and preservation of muscle mass.   She has agreed to: Continue current level of physical activity  and Increase physical activity in their day and reduce sedentary time (increase NEAT).   Pharmacotherapy She has been struggling with her weight loss and is interested in starting GIP/GLP-1s.  Pt has expressed an interest in starting Mounjaro  in the past. Her prior authorization was denied by insurance ( wrote script on 09/27/23 ) and pt states she is prepared to self-pay for Zepbound . Pt denies any personal or family history of pancreatitis, MEN syndrome, or medullary thyroid  cancer etc as per previous note.   Reviewed risks/benefits of Zepbound ; extensive counseling provided.  Educated pt on potential increase risk of reflux, chronic  constipation, and GI upset if eating off plan foods. Stressed the importance of eating on plan and exercising avoid these side effects. Encouraged regular exercise, increased water intake, and increase fiber.   - Also counseled pt on how the [employer] works and what she needs to do to obtain medication from them, cost and what to expect discussed.  We both agreed to: Continue with current nutritional and behavioral strategies and START Zepbound  today via [employer].     ASSOCIATED CONDITIONS ADDRESSED TODAY: Prediabetes Assessment & Plan: Lab Results  Component Value Date   HGBA1C 5.6 05/25/2023   HGBA1C 5.7 (H) 09/15/2022   HGBA1C 6.3 01/06/2022   INSULIN  15.9 05/25/2023   INSULIN  20.1 09/15/2022    Compliant with Metformin  500 mg with breakfast and lunch and 1,000 mg with dinner. Tolerating well with no adverse side effects.  Given she is starting Zepbound , she is interested in decreasing her evening dose for Metformin .   -Advised pt to continue best efforts with following her meal plan --> increase fiber and protein intake, decrease simple carb/sugar intake, and staying adequately hydrated.  - Reminded pt to eat on plan to avoid potential side effects including GI upset.  -After discussion of starting Zepbound  for weight loss, we mutually agreed to decrease Metformin  to 500 mg TID. Will continue to monitor condition alongside PCP.     Vitamin D  deficiency Assessment & Plan: Lab Results  Component Value Date   VD25OH 56.9 05/25/2023   VD25OH 32.6 09/15/2022   Compliant with ERGO 50K units once weekly. Tolerating well with no SE. No acute concerns today. Continue current supplementation; will refill ERGO. Continue monitoring and will recheck levels periodically.     Follow up:   Return in 3 weeks (on 11/08/2023) for 4 week f/u on 11/15/2023 at 7:40 AM . She was informed of the importance of frequent follow up visits to maximize her success with intensive lifestyle  modifications for her multiple health conditions.   Subjective:   Chief complaint: Obesity Sheri [name] is here to discuss her progress with her obesity treatment plan. She is on the Category 2 Plan.   Interval History:  Sheri [name] is here for a follow up office visit. Since last OV on 09/27/23, she is up 3 lbs. States she is following her eating plan approximately 20% of the time. She states she is swimming 10 minutes 5 days per week. She has been struggling with her weight loss and is interested in starting GIP/GLP-1s. She has not been journaling, stating she tried to journal on the apps but stopped.    Pharmacotherapy that aid with weight loss: She is currently taking Metformin  500 mg with breakfast, 500 mg at lunch, and 1,000 mg with dinner.   Review of Systems:  Pertinent positives were addressed with patient today.  Reviewed by clinician on day of visit: allergies, medications, problem list, medical history, surgical history, family history, social history, and previous encounter notes.  Weight Summary and Biometrics   Weight Lost Since Last Visit: 0lb  Weight Gained Since Last Visit:  3lb   Vitals Temp: 98.7 F (37.1 C) BP: 119/80 Pulse Rate: 74 SpO2: 97 %   Anthropometric Measurements Height: 5' 4 (1.626 m) Weight: 200 lb (90.7 kg) BMI (Calculated): 34.31 Weight at Last Visit: 197lb Weight Lost Since Last Visit: 0lb Weight Gained Since Last Visit: 3lb Starting Weight: 214lb Total Weight Loss (lbs): 14 lb (6.35 kg) Peak Weight: 225lb   Body Composition  Body Fat %: 40 % Fat Mass (lbs): 80.4 lbs Muscle Mass (lbs): 114.4 lbs Total Body Water (lbs): 79 lbs Visceral Fat Rating : 8   Other Clinical Data Fasting: No Labs: no Today's Visit #: 17 Starting Date: 09/15/22    Objective:   PHYSICAL EXAM: Blood pressure 119/80, pulse 74, temperature 98.7 F (37.1 C), height 5' 4 (1.626 m), weight 200 lb (90.7 kg), SpO2 97%. Body mass index is 34.33  kg/m.  General: she is overweight, cooperative and in no acute distress. PSYCH: Has normal mood, affect and thought process.   HEENT: EOMI, sclerae are anicteric. Lungs: Normal breathing effort, no conversational dyspnea. Extremities: Moves * 4 Neurologic: A and O * 3, good insight  DIAGNOSTIC DATA REVIEWED: BMET    Component Value Date/Time   NA 139 05/25/2023 0857   K 4.2 05/25/2023 0857   CL 102 05/25/2023 0857   CO2 21 05/25/2023 0857   GLUCOSE 84 05/25/2023 0857   GLUCOSE 73 01/06/2022 1518   BUN 13 05/25/2023 0857   CREATININE 0.85 05/25/2023 0857   CALCIUM 9.6 05/25/2023 0857   GFRNONAA >60 05/17/2016 0453   GFRAA >60 05/17/2016 0453   Lab Results  Component Value Date   HGBA1C 5.6 05/25/2023   HGBA1C 5.5 02/13/2015   Lab Results  Component Value Date   INSULIN  15.9 05/25/2023   INSULIN  20.1 09/15/2022   Lab Results  Component Value Date   TSH 2.400 09/15/2022   CBC    Component Value Date/Time   WBC 7.7 09/15/2022 0939   WBC 9.5 01/06/2022 1518   RBC 4.52 09/15/2022 0939   RBC 4.61 01/06/2022 1518   HGB 12.9 09/15/2022 0939   HCT 39.2 09/15/2022 0939   PLT 334 09/15/2022 0939   MCV 87 09/15/2022 0939   MCH 28.5 09/15/2022 0939   MCH 29.6 12/12/2019 0600   MCHC 32.9 09/15/2022 0939   MCHC 33.2 01/06/2022 1518   RDW 13.2 09/15/2022 0939   Iron  Studies    Component Value Date/Time   IRON  54 06/12/2020 1045   FERRITIN 12.1 06/12/2020 1045   IRONPCTSAT 11.7 (L) 06/12/2020 1045   Lipid Panel     Component Value Date/Time   CHOL 237 (H) 05/25/2023 0857   TRIG 151 (H) 05/25/2023 0857   HDL 50 05/25/2023 0857   CHOLHDL 4.7 (H) 05/25/2023 0857   CHOLHDL 4 06/12/2020 1045   VLDL 27.8 06/12/2020 1045   LDLCALC 160 (H) 05/25/2023 0857   LDLDIRECT 150.0 02/13/2015 1449   Hepatic Function Panel     Component Value Date/Time   PROT 7.8 05/25/2023 0857   ALBUMIN 4.5 05/25/2023 0857   AST 12 05/25/2023 0857   ALT 14 05/25/2023 0857   ALKPHOS 68  05/25/2023 0857   BILITOT 0.4 05/25/2023 0857   BILIDIR 0.1 01/06/2022 1518      Component Value Date/Time   TSH 2.400 09/15/2022 0939   Nutritional Lab Results  Component Value Date   VD25OH 56.9 05/25/2023   VD25OH 32.6 09/15/2022    Attestations:   I, Vernell Forest, acting as a medical  scribe for Sheri Jenkins, DO., have compiled all relevant documentation for today's office visit on behalf of Sheri Jenkins, DO, while in the presence of [employer], DO.  I have reviewed the above documentation for accuracy and completeness, and I agree with the above. Sheri JINNY [name], D.O.  The 21st Century Cures Act was signed into law in 2016 which includes the topic of electronic health records.  This provides immediate access to information in MyChart.  This includes consultation notes, operative notes, office notes, lab results and pathology reports.  If you have any questions about what you read please let us  know at your next visit so we can discuss your concerns and take corrective action if need be.  We are right here with you.

## 2023-10-23 ENCOUNTER — Other Ambulatory Visit (INDEPENDENT_AMBULATORY_CARE_PROVIDER_SITE_OTHER): Payer: Self-pay | Admitting: Family Medicine

## 2023-10-23 DIAGNOSIS — E559 Vitamin D deficiency, unspecified: Secondary | ICD-10-CM

## 2023-11-03 ENCOUNTER — Other Ambulatory Visit (INDEPENDENT_AMBULATORY_CARE_PROVIDER_SITE_OTHER): Payer: Self-pay | Admitting: Family Medicine

## 2023-11-03 ENCOUNTER — Encounter (HOSPITAL_BASED_OUTPATIENT_CLINIC_OR_DEPARTMENT_OTHER): Payer: Self-pay | Admitting: Cardiology

## 2023-11-03 ENCOUNTER — Ambulatory Visit (HOSPITAL_BASED_OUTPATIENT_CLINIC_OR_DEPARTMENT_OTHER): Admitting: Cardiology

## 2023-11-03 VITALS — BP 110/70 | HR 55 | Ht 64.0 in | Wt 202.0 lb

## 2023-11-03 DIAGNOSIS — E66811 Obesity, class 1: Secondary | ICD-10-CM

## 2023-11-03 DIAGNOSIS — E6609 Other obesity due to excess calories: Secondary | ICD-10-CM

## 2023-11-03 DIAGNOSIS — E782 Mixed hyperlipidemia: Secondary | ICD-10-CM

## 2023-11-03 DIAGNOSIS — Z8249 Family history of ischemic heart disease and other diseases of the circulatory system: Secondary | ICD-10-CM | POA: Diagnosis not present

## 2023-11-03 DIAGNOSIS — Z6834 Body mass index (BMI) 34.0-34.9, adult: Secondary | ICD-10-CM

## 2023-11-03 DIAGNOSIS — R002 Palpitations: Secondary | ICD-10-CM

## 2023-11-03 DIAGNOSIS — Z7189 Other specified counseling: Secondary | ICD-10-CM

## 2023-11-03 DIAGNOSIS — E66812 Obesity, class 2: Secondary | ICD-10-CM

## 2023-11-03 DIAGNOSIS — Z86711 Personal history of pulmonary embolism: Secondary | ICD-10-CM | POA: Diagnosis not present

## 2023-11-03 NOTE — Progress Notes (Signed)
 Cardiology Office Note:  .   Date:  11/03/2023  ID:  Sheri [name], DOB 08-11-1990, MRN [id number] PCP: Charlett [name], MD   HeartCare Providers Cardiologist:  Shelda Bruckner, MD {  History of Present Illness: .   Sheri [name] is a 33 y.o. female with PMH PE 2011 (2/2 OCP), syncope 2018 (2/2 norovirus). She was previously followed by Dr. Hobart, Dr. Maranda, and Dr Jeffrie and established care with me on 11/03/23.  Pertinent CV history: history of syncope in the setting of norovirus secondary to dehydration. CT angiogram 05/16/2016 negative for PE. TTE with normal LVEF 60 to 65% with mild MR/TR.   Saw Dr. Jeffrie in 01/2021 for palpitations with apple watch revealing HR in 120s. Zio monitor with NSR with average HR 90, rare ectopy, no arrhythmias.  Today: Seeing healthy weight and wellness, noted that her cholesterol spiked on recent labs. Has family history of high cholesterol, mother has elevated lp(a). Was on phentermine  for a while, was flushed and had palpitations. Still with some symptoms a month off of this, but slowly getting better. Just started zepbound , paying out of pocket. Reviewed options, current state of coverage for these. Has been on for two weeks. Tried topamax  in the past, didn't tolerate.  Has has intermittent chronic palpitations, was tolerable until phentermine , then exacerbated, now going back to her baseline.  Notes that she was diagnosed with asthma, largely exercise induced, illness triggered, and some chemicals. Much better with inhaler. Now swimming laps for about 10 minutes at her pool at home. [facility] [employer] to get exercise when the weather gets cool. Breo inhaler is daily, others are PRN.   Recent lipids from 05/2023 showed Tchol 237, TG 151, HDL 50, LDL 160 (up from 123). Diet at the time was high protein, low carb working through healthy weight and wellness. Had lost ~18 lbs at this point. Doesn't eat a lot of red meat,   ROS:  Denies chest pain, shortness of breath at rest or with normal exertion. No PND, orthopnea, LE edema or unexpected weight gain. No syncope. ROS otherwise negative except as noted.   Studies Reviewed: SABRA    EKG:  EKG Interpretation Date/Time:  Wednesday November 03 2023 09:15:41 EDT Ventricular Rate:  65 PR Interval:  122 QRS Duration:  88 QT Interval:  414 QTC Calculation: 430 R Axis:   39  Text Interpretation: Normal sinus rhythm Normal ECG Confirmed by Bruckner Shelda [phone]) on 11/03/2023 9:49:30 PM    Physical Exam:   VS:  BP 110/70 (BP Location: Left Arm, Patient Position: Sitting, Cuff Size: Normal)   Pulse (!) 55   Ht 5' 4 (1.626 m)   Wt 202 lb (91.6 kg)   BMI 34.67 kg/m    Wt Readings from Last 3 Encounters:  11/03/23 202 lb (91.6 kg)  10/18/23 200 lb (90.7 kg)  10/14/23 202 lb (91.6 kg)    GEN: Well nourished, well developed in no acute distress HEENT: Normal, moist mucous membranes NECK: No JVD CARDIAC: regular rhythm, normal S1 and S2, no rubs or gallops. No murmur. VASCULAR: Radial and DP pulses 2+ bilaterally. No carotid bruits RESPIRATORY:  Clear to auscultation without rales, wheezing or rhonchi  ABDOMEN: Soft, non-tender, non-distended MUSCULOSKELETAL:  Ambulates independently SKIN: Warm and dry, no edema NEUROLOGIC:  Alert and oriented x 3. No focal neuro deficits noted. PSYCHIATRIC:  Normal affect    ASSESSMENT AND PLAN: .    Palpitations -at baseline, reviewed prior workup -no  high risk features at this time, monitor for changes in symptoms  Mixed hyperlipidemia Family history of heart disease -recent worsening of lipid panel.  -will focus on weight loss, lifestyle, recheck lipids in 6 mos  Class 1 obesity, BMI 34 -started zepbound , doing well, reviewed GLP and data for CV risk reduction in secondary prevention (she is primary prevention at this time)  CV risk counseling and prevention -recommend heart healthy/Mediterranean diet, with whole  grains, fruits, vegetable, fish, lean meats, nuts, and olive oil. Limit salt. -recommend moderate walking, 3-5 times/week for 30-50 minutes each session. Aim for at least 150 minutes.week. Goal should be pace of 3 miles/hours, or walking 1.5 miles in 30 minutes -recommend avoidance of tobacco products. Avoid excess alcohol. -ASCVD risk score: The ASCVD Risk score (Arnett DK, et al., 2019) failed to calculate for the following reasons:   The 2019 ASCVD risk score is only valid for ages 70 to 75    Dispo: 6 mos, labs prior  Signed, Shelda Bruckner, MD   Shelda Bruckner, MD, PhD, [facility]  [facility]  [place]  [facility] at [facility] [address] [place], [place] [address] [phone]

## 2023-11-03 NOTE — Patient Instructions (Signed)
 Medication Instructions:   Your physician recommends that you continue on your current medications as directed. Please refer to the Current Medication list given to you today.   *If you need a refill on your cardiac medications before your next appointment, please call your pharmacy*  Lab Work:  Your physician recommends that you return for a FASTING lipid profile one week prior to appointment with Dr. Lonni in 6 months.     If you have labs (blood work) drawn today and your tests are completely normal, you will receive your results only by: MyChart Message (if you have MyChart) OR A paper copy in the mail If you have any lab test that is abnormal or we need to change your treatment, we will call you to review the results.  Testing/Procedures:  None ordered.   Follow-Up: At [facility], you and your health needs are our priority.  As part of our continuing mission to provide you with exceptional heart care, our providers are all part of one team.  This team includes your primary Cardiologist (physician) and Advanced Practice Providers or APPs (Physician Assistants and Nurse Practitioners) who all work together to provide you with the care you need, when you need it.  Your next appointment:   6 month(s)  Provider:   Shelda Lonni, MD    We recommend signing up for the patient portal called MyChart.  Sign up information is provided on this After Visit Summary.  MyChart is used to connect with patients for Virtual Visits (Telemedicine).  Patients are able to view lab/test results, encounter notes, upcoming appointments, etc.  Non-urgent messages can be sent to your provider as well.   To learn more about what you can do with MyChart, go to [email or web address].

## 2023-11-08 ENCOUNTER — Telehealth (HOSPITAL_COMMUNITY): Admitting: Psychiatry

## 2023-11-08 ENCOUNTER — Encounter (HOSPITAL_COMMUNITY): Payer: Self-pay | Admitting: Psychiatry

## 2023-11-08 DIAGNOSIS — F39 Unspecified mood [affective] disorder: Secondary | ICD-10-CM

## 2023-11-08 DIAGNOSIS — F419 Anxiety disorder, unspecified: Secondary | ICD-10-CM | POA: Diagnosis not present

## 2023-11-08 MED ORDER — SERTRALINE HCL 50 MG PO TABS: 75.0000 mg | ORAL_TABLET | Freq: Every day | ORAL | 0 refills | Status: DC

## 2023-11-08 MED ORDER — BUSPIRONE HCL 15 MG PO TABS: 15.0000 mg | ORAL_TABLET | Freq: Two times a day (BID) | ORAL | 0 refills | Status: DC

## 2023-11-08 MED ORDER — LAMOTRIGINE 100 MG PO TABS: 100.0000 mg | ORAL_TABLET | Freq: Every day | ORAL | 0 refills | Status: DC

## 2023-11-08 NOTE — Progress Notes (Signed)
 [facility] MD Virtual Progress Note   Patient Location: Work Provider Location: Home Office  I connect with patient by video and verified that I am speaking with correct person by using two identifiers. I discussed the limitations of evaluation and management by telemedicine and the availability of in person appointments. I also discussed with the patient that there may be a patient responsible charge related to this service. The patient expressed understanding and agreed to proceed.  Sheri [name] [id number] 33 y.o.  11/08/2023 3:23 PM  History of Present Illness:  Patient is evaluated by video session.  She reported some anxiety lately but not sure what is triggered.  She stopped the Topamax  that helps her cognition and attention.  Her attention concentration is better.  She denies any panic attack but feel nervous.  When discussed about her job she admitted funding for the job is challenging and may not continue next year.  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  She reported otherwise things are going okay.  She had a good support from her husband.  Her stepson is now 19 year old and her daughter is going to be 59 years old soon.  They have a upcoming beach trip for 1 night.  She also started weight loss program and trying to get Zepbound  but it is expensive.  She has no tremors, shakes or any EPS.  She denies any hallucination, paranoia or any suicidal thoughts.  She tried phentermine  but after feeling anxious she stopped and she is without the phentermine  for a while.  She denies any hopelessness or worthlessness.  She is taking BuSpar , Lamictal  and Zoloft .  Past Psychiatric History: H/O depression, anxiety, Bipolar, post partum and ADHD. On medication since age 89.  H/O addiction with opiates, Benzo and Adderall. Received therapy and medications at [facility] counseling and [facility] but not happy with care.  Had rehab at [facility] in 2015.  Did well for 5 years without medication until symptoms returns. H/O suicidal thoughts but no attempt. No psychosis.  Tried Celexa caused dizziness, trazodone, weight gain.  Took Lamictal , Klonopin, Lexapro, hydroxyzine , Ambien , Seroquel, Latuda, Hydroxyzine  and Wellbutrin.        Outpatient Encounter Medications as of 11/08/2023  Medication Sig   albuterol  (PROAIR  HFA) 108 (90 Base) MCG/ACT inhaler Inhale 2 puffs into the lungs every 6 (six) hours as needed for wheezing or shortness of breath.   Albuterol -Budesonide (AIRSUPRA ) 90-80 MCG/ACT AERO Inhale 2 puffs into the lungs every 4 (four) hours as needed (coughing, wheezing, chest tightness). Do not exceed 12 puffs in 24 hours.   aspirin EC 81 MG tablet Take 81 mg by mouth daily. Swallow whole.   busPIRone  (BUSPAR ) 15 MG tablet Take 1 tablet (15 mg total) by mouth 2 (two) times daily.   cetirizine (ZYRTEC) 10 MG tablet Take 10 mg by mouth daily.   cyanocobalamin  (VITAMIN B12) 500 MCG tablet Take 1 tablet (500 mcg total) by mouth daily.   fluticasone  furoate-vilanterol (BREO ELLIPTA ) 100-25 MCG/ACT AEPB Inhale 1 puff into the lungs daily. Rinse mouth after each use.   lamoTRIgine  (LAMICTAL ) 100 MG tablet Take 1 tablet (100 mg total) by mouth daily.   meloxicam  (MOBIC ) 15 MG tablet TAKE 1 TABLET (15 MG TOTAL) BY MOUTH DAILY.   metFORMIN  (GLUCOPHAGE ) 500 MG tablet 1 po tid with meals   ondansetron  (ZOFRAN -ODT) 4 MG disintegrating tablet Take 1 tablet (4 mg total) by mouth every 8 (eight)  hours as needed for nausea or vomiting.   paragard intrauterine copper IUD IUD 1 each by Intrauterine route once.   sertraline  (ZOLOFT ) 50 MG tablet Take 1 tablet (50 mg total) by mouth daily.   tirzepatide  (ZEPBOUND ) 2.5 MG/0.5ML injection vial Inject 2.5 mg into the skin once a week.   Vitamin D , Ergocalciferol , (DRISDOL ) 1.25 MG (50000 UNIT) CAPS capsule Take 1 capsule (50,000 Units total) by mouth every 7 (seven) days.   No  facility-administered encounter medications on file as of 11/08/2023.    No results found for this or any previous visit (from the past 2160 hours).   Psychiatric Specialty Exam: Physical Exam  Review of Systems  Weight 202 lb (91.6 kg).There is no height or weight on file to calculate BMI.  General Appearance: Casual  Eye Contact:  Good  Speech:  Clear and Coherent and Normal Rate  Volume:  Normal  Mood:  Anxious  Affect:  Congruent  Thought Process:  Goal Directed  Orientation:  Full (Time, Place, and Person)  Thought Content:  WDL  Suicidal Thoughts:  No  Homicidal Thoughts:  No  Memory:  Immediate;   Good Recent;   Good Remote;   Good  Judgement:  Good  Insight:  Present  Psychomotor Activity:  Normal  Concentration:  Concentration: Good and Attention Span: Good  Recall:  Good  Fund of Knowledge:  Good  Language:  Good  Akathisia:  No  Handed:  Right  AIMS (if indicated):     Assets:  Communication Skills Desire for Improvement Housing Social Support Talents/Skills Transportation  ADL's:  Intact  Cognition:  WNL  Sleep:  ok       04/15/2023    8:59 AM 04/30/2022    2:10 PM 01/06/2022    2:45 PM 06/05/2020    1:47 PM 06/02/2016    3:44 PM  Depression screen PHQ 2/9  Decreased Interest 0 0 0 0 0  Down, Depressed, Hopeless 0 0 0 0 0  PHQ - 2 Score 0 0 0 0 0  Altered sleeping 0 0 0    Tired, decreased energy 0 0 1    Change in appetite 0 0 1    Feeling bad or failure about yourself  0 0 0    Trouble concentrating 0 0 0    Moving slowly or fidgety/restless 0 0 0    Suicidal thoughts 0 0 0    PHQ-9 Score 0 0 2    Difficult doing work/chores Not difficult at all Not difficult at all Not difficult at all      Assessment/Plan: Anxiety - Plan: busPIRone  (BUSPAR ) 15 MG tablet, lamoTRIgine  (LAMICTAL ) 100 MG tablet, sertraline  (ZOLOFT ) 50 MG tablet  Mood disorder (HCC) - Plan: lamoTRIgine  (LAMICTAL ) 100 MG tablet, sertraline  (ZOLOFT ) 50 MG tablet  Discussed  anxiety.  Patient is no longer on phentermine  and not taking Topamax .  Not sure why she feels anxious however after some discussion realized could be a job related as her job depending on [employer]ater[employer].  She is in [facility] with [facility].  Discussed to optimize Zoloft  25 to 75 mg.  She agreed to give a try.  Will continue Zoloft  but higher dose.  Continue Lamictal  100 mg daily and BuSpar  50 mg twice a day.  Recommend to call back if she has any question, concern or if she feels worsening of the symptoms.  Encouraged to continue therapy with [name].  Will follow-up in  3 months or sooner if needed.   Follow Up Instructions:     I discussed the assessment and treatment plan with the patient. The patient was provided an opportunity to ask questions and all were answered. The patient agreed with the plan and demonstrated an understanding of the instructions.   The patient was advised to call back or seek an in-person evaluation if the symptoms worsen or if the condition fails to improve as anticipated.    Collaboration of Care: Other provider involved in patient's care AEB notes are available in epic to review  Patient/Guardian was advised Release of Information must be obtained prior to any record release in order to collaborate their care with an outside provider. Patient/Guardian was advised if they have not already done so to contact the registration department to sign all necessary forms in order for us  to release information regarding their care.   Consent: Patient/Guardian gives verbal consent for treatment and assignment of benefits for services provided during this visit. Patient/Guardian expressed understanding and agreed to proceed.     Total encounter time 27 minutes which includes face-to-face time, chart reviewed, care coordination, order entry and documentation during this encounter.   Note: This document was prepared by [employer] voice dictation  technology and any errors that results from this process are unintentional.    Leni ONEIDA Client, MD 11/08/2023

## 2023-11-10 ENCOUNTER — Other Ambulatory Visit (HOSPITAL_COMMUNITY): Payer: Self-pay | Admitting: Psychiatry

## 2023-11-10 DIAGNOSIS — F419 Anxiety disorder, unspecified: Secondary | ICD-10-CM

## 2023-11-10 DIAGNOSIS — F39 Unspecified mood [affective] disorder: Secondary | ICD-10-CM

## 2023-11-15 ENCOUNTER — Ambulatory Visit (INDEPENDENT_AMBULATORY_CARE_PROVIDER_SITE_OTHER): Admitting: Family Medicine

## 2023-11-15 ENCOUNTER — Encounter (INDEPENDENT_AMBULATORY_CARE_PROVIDER_SITE_OTHER): Payer: Self-pay | Admitting: Family Medicine

## 2023-11-15 VITALS — BP 106/69 | HR 90 | Temp 98.0°F | Ht 64.0 in | Wt 194.0 lb

## 2023-11-15 DIAGNOSIS — Z6834 Body mass index (BMI) 34.0-34.9, adult: Secondary | ICD-10-CM

## 2023-11-15 DIAGNOSIS — E66812 Obesity, class 2: Secondary | ICD-10-CM

## 2023-11-15 DIAGNOSIS — E559 Vitamin D deficiency, unspecified: Secondary | ICD-10-CM | POA: Diagnosis not present

## 2023-11-15 DIAGNOSIS — R7303 Prediabetes: Secondary | ICD-10-CM

## 2023-11-15 DIAGNOSIS — E538 Deficiency of other specified B group vitamins: Secondary | ICD-10-CM | POA: Diagnosis not present

## 2023-11-15 MED ORDER — METFORMIN HCL 500 MG PO TABS: ORAL_TABLET | ORAL | 0 refills | Status: DC

## 2023-11-15 MED ORDER — TIRZEPATIDE-WEIGHT MANAGEMENT 5 MG/0.5ML ~~LOC~~ SOLN
5.0000 mg | SUBCUTANEOUS | 0 refills | Status: DC
Start: 2023-11-15 — End: 2023-12-06

## 2023-11-15 MED ORDER — VITAMIN D (ERGOCALCIFEROL) 1.25 MG (50000 UNIT) PO CAPS: 50000.0000 [IU] | ORAL_CAPSULE | ORAL | 0 refills | Status: DC

## 2023-11-15 NOTE — Progress Notes (Signed)
 Sheri [name], D.O.  ABFM, ABOM Specializing in Clinical Bariatric Medicine  Office located at: [address] [place], [place]  [address]   Assessment and Plan:   Medications Discontinued During This Encounter  Medication Reason   tirzepatide  (ZEPBOUND ) 2.5 MG/0.5ML injection vial    Vitamin D , Ergocalciferol , (DRISDOL ) 1.25 MG (50000 UNIT) CAPS capsule Reorder   metFORMIN  (GLUCOPHAGE ) 500 MG tablet Reorder     Meds ordered this encounter  Medications   metFORMIN  (GLUCOPHAGE ) 500 MG tablet    Sig: 1 po tid with meals    Dispense:  90 tablet    Refill:  0   Vitamin D , Ergocalciferol , (DRISDOL ) 1.25 MG (50000 UNIT) CAPS capsule    Sig: Take 1 capsule (50,000 Units total) by mouth every 7 (seven) days.    Dispense:  4 capsule    Refill:  0   tirzepatide  5 MG/0.5ML injection vial    Sig: Inject 5 mg into the skin once a week.    Dispense:  2 mL    Refill:  0     FOR THE DISEASE OF OBESITY:   BMI 34.0-34.9,adult Class 2 severe obesity with serious comorbidity and body mass index (BMI) of 36.0 to 36.9 in adult, unspecified obesity type [facility]) Assessment & Plan: Since last office visit on 10/18/2023 patient's muscle mass has decreased by 1.4 lbs. Fat mass has decreased by 5.2 lbs. Total body water has decreased by 1.6 lbs.  Counseling done on how various foods will affect these numbers and how to maximize success  Total lbs lost to date: -20 lbs Total weight loss percentage to date: -9.35 %   Recommended Dietary Goals Muskaan is currently in the action stage of change. As such, her goal is to continue weight management plan.  She has agreed to: continue current plan   Behavioral Intervention We discussed the following today: increasing lean protein intake to established goals, increasing water intake , and continue to work on implementation of reduced calorie nutritional plan  Additional resources provided today: None  Evidence-based interventions for health  behavior change were utilized today including the discussion of self monitoring techniques, problem-solving barriers and SMART goal setting techniques.   Regarding patient's less desirable eating habits and patterns, we employed the technique of small changes.   Pt will specifically work on: n/a   Recommended Physical Activity Goals Kaveri has been advised to work up to 300-450 minutes of moderate intensity aerobic activity a week and strengthening exercises 2-3 times per week for cardiovascular health, weight loss maintenance and preservation of muscle mass.   She was encouraged to continue to gradually increase the amount and intensity of exercise routine   Pharmacotherapy She started Zepbound  2.5 mg weekly after LOV ( 10/18/2023). She denies constipation. Had nausea and muscle cramping the first week but symptoms have improved. She feels fuller faster. She is journaling her intake daily and prioritizing her protein intake. Endorses hydrating well.   After discussion of benefits and side effects patient will INCREASE Zepbound  to 5 mg weekly. Continue adequate hydration and journaling intake with an emphasis on decreasing simple carbs/ sugars; increasing fiber and proteins.    ASSOCIATED CONDITIONS ADDRESSED TODAY:   Prediabetes Assessment & Plan: Lab Results  Component Value Date   HGBA1C 5.6 05/25/2023   HGBA1C 5.7 (H) 09/15/2022   HGBA1C 6.3 01/06/2022   INSULIN  15.9 05/25/2023   INSULIN  20.1 09/15/2022    We discussed decreasing her Metformin  to 500 mg TID last OV  given she was starting Zepbound . She is still taking 500 mg with breakfast and lunch and 1,000 mg with dinner.   - Pt reminded to decrease Metformin  to 500 mg TID.  - Continue working on nutrition plan to decrease simple carbohydrates, increase lean proteins and exercise to promote weight loss and improve glycemic control and prevent progression to T2DM.    B12 deficiency Assessment & Plan: Lab Results   Component Value Date   VITAMINB12 420 05/25/2023   On OTC B12 500 mcg daily. Of note, she received a one time B12 injection from Dr.Smith on 10/14/2023.   - Continue B12 supplementation.  - Continue their prudent nutritional plan and focus on b12 rich foods such as lean red meats; poultry; eggs; seafood; beans, peas, and lentils; nuts and seeds; and soy products - Recheck; future.     Vitamin D  deficiency Assessment & Plan: Lab Results  Component Value Date   VD25OH 56.9 05/25/2023   VD25OH 32.6 09/15/2022   Currently on Ergocalciferol  50,000 units weekly without any adverse effects such as nausea, vomiting or muscle weakness. No acute concerns.   - Continue Vit D supplementation.  - Recheck-future     Follow up:   Return 12/06/2023 at 8:20 AM.  She was informed of the importance of frequent follow up visits to maximize her success with intensive lifestyle modifications for her multiple health conditions.   Subjective:   Chief complaint: Obesity Haddy is here to discuss her progress with her obesity treatment plan. She is on the Category 2 Plan and journaling 1300 calories and 85+ grams protein daily and states she is following her eating plan approximately 75% of the time. She states she is swimming 10 minutes 2 days per week.   Interval History:  Jasreet Dickie is here for a follow up office visit. She started Zepbound  2.5 mg weekly after LOV ( 10/18/2023). She denies constipation. Had nausea and muscle cramping the first week but symptoms have improved. She feels fuller faster. She is journaling her intake daily and prioritizing her protein intake. Lowest calories: 1044. Highest calories: 3222. Lowest Protein: 57. Highest protein: 143. She is down 6 lbs since LOV.    Pharmacotherapy that aid with weight loss: She is currently taking Zepbound  2.5 mg weekly and Metformin  500 mg with breakfast, 500 mg at lunch, and 1,000 mg with dinner.    Review of Systems:   Pertinent positives were addressed with patient today.  Reviewed by clinician on day of visit: allergies, medications, problem list, medical history, surgical history, family history, social history, and previous encounter notes.  Weight Summary and Biometrics   Weight Lost Since Last Visit: 6lb  Weight Gained Since Last Visit: 0lb    Vitals Temp: 98 F (36.7 C) BP: 106/69 Pulse Rate: 90 SpO2: 98 %   Anthropometric Measurements Height: 5' 4 (1.626 m) Weight: 194 lb (88 kg) BMI (Calculated): 33.28 Weight at Last Visit: 202lb Weight Lost Since Last Visit: 6lb Weight Gained Since Last Visit: 0lb Starting Weight: 214lb Total Weight Loss (lbs): 20 lb (9.072 kg) Peak Weight: 225lb   Body Composition  Body Fat %: 38.7 % Fat Mass (lbs): 75.2 lbs Muscle Mass (lbs): 113 lbs Total Body Water (lbs): 77.4 lbs Visceral Fat Rating : 8   Other Clinical Data Fasting: No Labs: no Today's Visit #: 18 Starting Date: 09/15/22    Objective:   PHYSICAL EXAM: Blood pressure 106/69, pulse 90, temperature 98 F (36.7 C), height 5' 4 (1.626 m), weight  194 lb (88 kg), SpO2 98%. Body mass index is 33.3 kg/m.  General: she is overweight, cooperative and in no acute distress. PSYCH: Has normal mood, affect and thought process.   HEENT: EOMI, sclerae are anicteric. Lungs: Normal breathing effort, no conversational dyspnea. Extremities: Moves * 4 Neurologic: A and O * 3, good insight  DIAGNOSTIC DATA REVIEWED: BMET    Component Value Date/Time   NA 139 05/25/2023 0857   K 4.2 05/25/2023 0857   CL 102 05/25/2023 0857   CO2 21 05/25/2023 0857   GLUCOSE 84 05/25/2023 0857   GLUCOSE 73 01/06/2022 1518   BUN 13 05/25/2023 0857   CREATININE 0.85 05/25/2023 0857   CALCIUM 9.6 05/25/2023 0857   GFRNONAA >60 05/17/2016 0453   GFRAA >60 05/17/2016 0453   Lab Results  Component Value Date   HGBA1C 5.6 05/25/2023   HGBA1C 5.5 02/13/2015   Lab Results  Component Value Date    INSULIN  15.9 05/25/2023   INSULIN  20.1 09/15/2022   Lab Results  Component Value Date   TSH 2.400 09/15/2022   CBC    Component Value Date/Time   WBC 7.7 09/15/2022 0939   WBC 9.5 01/06/2022 1518   RBC 4.52 09/15/2022 0939   RBC 4.61 01/06/2022 1518   HGB 12.9 09/15/2022 0939   HCT 39.2 09/15/2022 0939   PLT 334 09/15/2022 0939   MCV 87 09/15/2022 0939   MCH 28.5 09/15/2022 0939   MCH 29.6 12/12/2019 0600   MCHC 32.9 09/15/2022 0939   MCHC 33.2 01/06/2022 1518   RDW 13.2 09/15/2022 0939   Iron  Studies    Component Value Date/Time   IRON  54 06/12/2020 1045   FERRITIN 12.1 06/12/2020 1045   IRONPCTSAT 11.7 (L) 06/12/2020 1045   Lipid Panel     Component Value Date/Time   CHOL 237 (H) 05/25/2023 0857   TRIG 151 (H) 05/25/2023 0857   HDL 50 05/25/2023 0857   CHOLHDL 4.7 (H) 05/25/2023 0857   CHOLHDL 4 06/12/2020 1045   VLDL 27.8 06/12/2020 1045   LDLCALC 160 (H) 05/25/2023 0857   LDLDIRECT 150.0 02/13/2015 1449   Hepatic Function Panel     Component Value Date/Time   PROT 7.8 05/25/2023 0857   ALBUMIN 4.5 05/25/2023 0857   AST 12 05/25/2023 0857   ALT 14 05/25/2023 0857   ALKPHOS 68 05/25/2023 0857   BILITOT 0.4 05/25/2023 0857   BILIDIR 0.1 01/06/2022 1518      Component Value Date/Time   TSH 2.400 09/15/2022 0939   Nutritional Lab Results  Component Value Date   VD25OH 56.9 05/25/2023   VD25OH 32.6 09/15/2022    Attestations:   I, Special Puri, acting as a Stage manager for [employer], DO., have compiled all relevant documentation for today's office visit on behalf of Sheri Jenkins, DO, while in the presence of [employer], DO.  I have reviewed the above documentation for accuracy and completeness, and I agree with the above. Sheri JINNY [name], D.O.  The 21st Century Cures Act was signed into law in 2016 which includes the topic of electronic health records.  This provides immediate access to information in MyChart. This includes  consultation notes, operative notes, office notes, lab results and pathology reports.  If you have any questions about what you read please let us  know at your next visit so we can discuss your concerns and take corrective action if need be.  We are right here with you.

## 2023-11-18 ENCOUNTER — Other Ambulatory Visit (INDEPENDENT_AMBULATORY_CARE_PROVIDER_SITE_OTHER): Payer: Self-pay | Admitting: Family Medicine

## 2023-11-18 DIAGNOSIS — E559 Vitamin D deficiency, unspecified: Secondary | ICD-10-CM

## 2023-12-02 ENCOUNTER — Other Ambulatory Visit (INDEPENDENT_AMBULATORY_CARE_PROVIDER_SITE_OTHER): Payer: Self-pay | Admitting: Family Medicine

## 2023-12-02 DIAGNOSIS — Z6834 Body mass index (BMI) 34.0-34.9, adult: Secondary | ICD-10-CM

## 2023-12-06 ENCOUNTER — Encounter (INDEPENDENT_AMBULATORY_CARE_PROVIDER_SITE_OTHER): Payer: Self-pay | Admitting: Family Medicine

## 2023-12-06 ENCOUNTER — Ambulatory Visit (INDEPENDENT_AMBULATORY_CARE_PROVIDER_SITE_OTHER): Admitting: Family Medicine

## 2023-12-06 VITALS — BP 112/77 | HR 79 | Temp 98.3°F | Ht 64.0 in | Wt 188.0 lb

## 2023-12-06 DIAGNOSIS — R7303 Prediabetes: Secondary | ICD-10-CM

## 2023-12-06 DIAGNOSIS — E66812 Obesity, class 2: Secondary | ICD-10-CM

## 2023-12-06 DIAGNOSIS — E782 Mixed hyperlipidemia: Secondary | ICD-10-CM | POA: Diagnosis not present

## 2023-12-06 DIAGNOSIS — Z6832 Body mass index (BMI) 32.0-32.9, adult: Secondary | ICD-10-CM

## 2023-12-06 DIAGNOSIS — E559 Vitamin D deficiency, unspecified: Secondary | ICD-10-CM | POA: Diagnosis not present

## 2023-12-06 DIAGNOSIS — Z6833 Body mass index (BMI) 33.0-33.9, adult: Secondary | ICD-10-CM

## 2023-12-06 MED ORDER — VITAMIN D (ERGOCALCIFEROL) 1.25 MG (50000 UNIT) PO CAPS: 50000.0000 [IU] | ORAL_CAPSULE | ORAL | 0 refills | Status: DC

## 2023-12-06 MED ORDER — METFORMIN HCL 500 MG PO TABS: ORAL_TABLET | ORAL | 0 refills | Status: DC

## 2023-12-06 MED ORDER — TIRZEPATIDE-WEIGHT MANAGEMENT 7.5 MG/0.5ML ~~LOC~~ SOLN
7.5000 mg | SUBCUTANEOUS | 0 refills | Status: DC
Start: 2023-12-06 — End: 2023-12-29

## 2023-12-06 NOTE — Progress Notes (Signed)
 Sheri [name], D.O.  ABFM, ABOM Specializing in Clinical Bariatric Medicine  Office located at: [address] [place], [place]  [address]   Assessment and Plan:   Medications Discontinued During This Encounter  Medication Reason   tirzepatide  5 MG/0.5ML injection vial Dose change   metFORMIN  (GLUCOPHAGE ) 500 MG tablet Reorder   Vitamin D , Ergocalciferol , (DRISDOL ) 1.25 MG (50000 UNIT) CAPS capsule Reorder     Meds ordered this encounter  Medications   metFORMIN  (GLUCOPHAGE ) 500 MG tablet    Sig: 1 po tid with meals    Dispense:  90 tablet    Refill:  0   tirzepatide  7.5 MG/0.5ML injection vial    Sig: Inject 7.5 mg into the skin once a week.    Dispense:  2 mL    Refill:  0   Vitamin D , Ergocalciferol , (DRISDOL ) 1.25 MG (50000 UNIT) CAPS capsule    Sig: Take 1 capsule (50,000 Units total) by mouth every 7 (seven) days.    Dispense:  4 capsule    Refill:  0     Recheck A1c, Vit D, B12, and BMP next OV   FOR THE DISEASE OF OBESITY:  BMI 33.0-33.9,adult Current BMI 32.25 Class 2 severe obesity with serious comorbidity and body mass index (BMI) of 36.0 to 36.9 in adult, unspecified obesity type (HCC) Starting BMI 36.73 on 09/15/22 Assessment & Plan: Since last office visit on 11/15/2023 patient's muscle mass has decreased by 3.4 lbs. Fat mass has decreased by 2.2 lbs. Total body water has decreased by 2 lbs.  Body fat % has increased by 0.1 %. Counseling done on how various foods will affect these numbers and how to maximize success  Total lbs lost to date: - 26 lbs Total weight loss percentage to date: -12.15 %   Recommended Dietary Goals Sheri [name] is currently in the action stage of change. As such, her goal is to continue weight management plan.  She has agreed to: continue current plan   Behavioral Intervention We discussed the following today: increasing lean protein intake to established goals, decreasing simple carbohydrates , increasing fiber rich  foods, and increasing water intake   Additional resources provided today: Handout on Daily Food Journaling Log  Evidence-based interventions for health behavior change were utilized today including the discussion of self monitoring techniques, problem-solving barriers and SMART goal setting techniques.   Regarding patient's less desirable eating habits and patterns, we employed the technique of small changes.   Goal: increase lean protein intake   Recommended Physical Activity Goals Sheri [name] has been advised to work up to 300-450 minutes of moderate intensity aerobic activity a week and strengthening exercises 2-3 times per week for cardiovascular health, weight loss maintenance and preservation of muscle mass.   She  may continue to gradually increase the amount and intensity of exercise routine   Pharmacotherapy On Zepbound  5 mg weekly with reported good compliance. She denies experiencing nausea, muscle cramping, or constipation. She does experience loose stools at times which are manageable. She is taking Metamucil and was encouraged to increase her water and fiber intake. Because she is having sweet cravings at night which causes her to go over in her calorie parameters,  we will increase Zepbound  to 7.5 mg weekly.     ASSOCIATED CONDITIONS ADDRESSED TODAY:   Prediabetes Assessment & Plan: Lab Results  Component Value Date   HGBA1C 5.6 05/25/2023   HGBA1C 5.7 (H) 09/15/2022   HGBA1C 6.3 01/06/2022   INSULIN  15.9 05/25/2023  INSULIN  20.1 09/15/2022    On Metformin  500 mg TID with reported good compliance and tolerance. Lately, she does endorse having a sweet tooth in the evenings; she feels this is related to eating less protein at night. She was encouraged to increase her protein intake for craving suppression (goal: 90++ grams daily) and reducing processed carbs and simple sugars in their diet.  She will continue Metformin  at current dose and increase exercise as able.      Mixed hyperlipidemia Assessment & Plan: Lab Results  Component Value Date   CHOL 237 (H) 05/25/2023   HDL 50 05/25/2023   LDLCALC 160 (H) 05/25/2023   LDLDIRECT 150.0 02/13/2015   TRIG 151 (H) 05/25/2023   CHOLHDL 4.7 (H) 05/25/2023   HLD managed with dietary and life style interventions. She will be getting labs through cardiology in the near future. Continue to work on nutrition plan -decreasing simple carbohydrates, increasing lean proteins, decreasing saturated fats and cholesterol , avoiding trans fats and exercise as able to promote weight loss, improve lipids and decrease cardiovascular risks.     Vitamin D  deficiency Assessment & Plan: Lab Results  Component Value Date   VD25OH 56.9 05/25/2023   VD25OH 32.6 09/15/2022   Currently on Ergocalciferol  50,000 units weekly without any adverse effects such as nausea, vomiting or muscle weakness. No acute concerns.  Continue Vit D supplementation. Recheck-future     Follow up:   Return 12/29/2023 8:20 AM.  She was informed of the importance of frequent follow up visits to maximize her success with intensive lifestyle modifications for her multiple health conditions.   Subjective:   Chief complaint: Obesity Sheri [name] is here to discuss her progress with her obesity treatment plan. She is on the Category 2 Plan and journaling 1200-1300 calories and 90+ grams protein daily and states she is following her eating plan approximately 50% of the time. She states she is swimming 20 minutes 2-3 days per week.   Interval History:  Sheri [name] is here for a follow up office visit. Since last OV on 11/15/2023 , she is down 6 lbs. She is focusing on proteins and eating healthy fruits and veggies despite traveling. She is journaling her intake and averages 80 grams protein per day. She admits to being over in her calorie intake which she attributes to eating sweets in the evenings.    Pharmacotherapy that aid with weight  loss: She is currently taking Metformin  500 mg TID and Zepbound  5 mg weekly.    Review of Systems:  Pertinent positives were addressed with patient today.  Reviewed by clinician on day of visit: allergies, medications, problem list, medical history, surgical history, family history, social history, and previous encounter notes.  Weight Summary and Biometrics   Weight Lost Since Last Visit: 6lb  Weight Gained Since Last Visit: 0   Vitals Temp: 98.3 F (36.8 C) BP: 112/77 Pulse Rate: 79 SpO2: 98 %   Anthropometric Measurements Height: 5' 4 (1.626 m) Weight: 188 lb (85.3 kg) BMI (Calculated): 32.25 Weight at Last Visit: 194lb Weight Lost Since Last Visit: 6lb Weight Gained Since Last Visit: 0 Starting Weight: 214lb Total Weight Loss (lbs): 26 lb (11.8 kg) Peak Weight: 225lb   Body Composition  Body Fat %: 38.8 % Fat Mass (lbs): 73 lbs Muscle Mass (lbs): 109.6 lbs Total Body Water (lbs): 75.4 lbs Visceral Fat Rating : 7   Other Clinical Data Fasting: no Labs: no Today's Visit #: 19 Starting Date: 09/15/22    Objective:  PHYSICAL EXAM: Blood pressure 112/77, pulse 79, temperature 98.3 F (36.8 C), height 5' 4 (1.626 m), weight 188 lb (85.3 kg), SpO2 98%. Body mass index is 32.27 kg/m.  General: she is overweight, cooperative and in no acute distress. PSYCH: Has normal mood, affect and thought process.   HEENT: EOMI, sclerae are anicteric. Lungs: Normal breathing effort, no conversational dyspnea. Extremities: Moves * 4 Neurologic: A and O * 3, good insight  DIAGNOSTIC DATA REVIEWED: BMET    Component Value Date/Time   NA 139 05/25/2023 0857   K 4.2 05/25/2023 0857   CL 102 05/25/2023 0857   CO2 21 05/25/2023 0857   GLUCOSE 84 05/25/2023 0857   GLUCOSE 73 01/06/2022 1518   BUN 13 05/25/2023 0857   CREATININE 0.85 05/25/2023 0857   CALCIUM 9.6 05/25/2023 0857   GFRNONAA >60 05/17/2016 0453   GFRAA >60 05/17/2016 0453   Lab Results   Component Value Date   HGBA1C 5.6 05/25/2023   HGBA1C 5.5 02/13/2015   Lab Results  Component Value Date   INSULIN  15.9 05/25/2023   INSULIN  20.1 09/15/2022   Lab Results  Component Value Date   TSH 2.400 09/15/2022   CBC    Component Value Date/Time   WBC 7.7 09/15/2022 0939   WBC 9.5 01/06/2022 1518   RBC 4.52 09/15/2022 0939   RBC 4.61 01/06/2022 1518   HGB 12.9 09/15/2022 0939   HCT 39.2 09/15/2022 0939   PLT 334 09/15/2022 0939   MCV 87 09/15/2022 0939   MCH 28.5 09/15/2022 0939   MCH 29.6 12/12/2019 0600   MCHC 32.9 09/15/2022 0939   MCHC 33.2 01/06/2022 1518   RDW 13.2 09/15/2022 0939   Iron  Studies    Component Value Date/Time   IRON  54 06/12/2020 1045   FERRITIN 12.1 06/12/2020 1045   IRONPCTSAT 11.7 (L) 06/12/2020 1045   Lipid Panel     Component Value Date/Time   CHOL 237 (H) 05/25/2023 0857   TRIG 151 (H) 05/25/2023 0857   HDL 50 05/25/2023 0857   CHOLHDL 4.7 (H) 05/25/2023 0857   CHOLHDL 4 06/12/2020 1045   VLDL 27.8 06/12/2020 1045   LDLCALC 160 (H) 05/25/2023 0857   LDLDIRECT 150.0 02/13/2015 1449   Hepatic Function Panel     Component Value Date/Time   PROT 7.8 05/25/2023 0857   ALBUMIN 4.5 05/25/2023 0857   AST 12 05/25/2023 0857   ALT 14 05/25/2023 0857   ALKPHOS 68 05/25/2023 0857   BILITOT 0.4 05/25/2023 0857   BILIDIR 0.1 01/06/2022 1518      Component Value Date/Time   TSH 2.400 09/15/2022 0939   Nutritional Lab Results  Component Value Date   VD25OH 56.9 05/25/2023   VD25OH 32.6 09/15/2022    Attestations:   I, Special Puri, acting as a Stage manager for [employer], DO., have compiled all relevant documentation for today's office visit on behalf of Sheri Jenkins, DO, while in the presence of [employer], DO.  I have spent 40 minutes in the care of the patient today including 29 minutes face-to-face assessing and reviewing listed medical problems above as outlined in office visit note and providing  nutritional and behavioral counseling as outlined in obesity care plan.   I have reviewed the above documentation for accuracy and completeness, and I agree with the above. Sheri [name], D.O.  The 21st Century Cures Act was signed into law in 2016 which includes the topic of electronic health records.  This provides immediate access to  information in MyChart.  This includes consultation notes, operative notes, office notes, lab results and pathology reports.  If you have any questions about what you read please let us  know at your next visit so we can discuss your concerns and take corrective action if need be.  We are right here with you.

## 2023-12-09 NOTE — Progress Notes (Deleted)
 [name] [address] [place] [address] Phone: [phone] Subjective:    I'm seeing this patient by the request  of:  Panosh, [name], MD  CC:   [name]  Sheri [name] is a 33 y.o. female coming in with complaint of back and neck pain. OMT on 10/14/2023. Patient states   Medications patient has been prescribed:   Taking:         Reviewed prior external information including notes and imaging from previsou exam, outside providers and external EMR if available.   As well as notes that were available from care everywhere and other healthcare systems.  Past medical history, social, surgical and family history all reviewed in electronic medical record.  No pertanent information unless stated regarding to the chief complaint.   Past Medical History:  Diagnosis Date   Alcohol addiction (HCC)    Anemia    Anxiety    Back pain    Bipolar depression (HCC)    under psych rx    Carpal tunnel syndrome    Chest pain    Depression    Drug addiction in remission [facility])    heroin   Enteritis due to Norovirus 05/18/2016   Heartburn    Hx of pulmonary embolus 02/13/2015   Hyperlipidemia    Hypokalemia 05/17/2016   IUD (intrauterine device) in place 02/13/2015   Joint pain    Mild mitral regurgitation    Mild tricuspid regurgitation    Mitral regurgitation 06/13/2018   Near syncope 05/16/2016   a. felt due to norovirus.   Nonallopathic lesion of cervical region 09/01/2018   Nonallopathic lesion of rib cage 09/01/2018   Obesity    Orthostatic hypotension 05/18/2016   Palpitations    PCOS (polycystic ovarian syndrome) 02/13/2015   Prediabetes    Pulmonary embolism (HCC) 2011   neg heme evaluation  felt to be from ocps    Recurrent upper respiratory infection (URI)    Scapular dyskinesis 09/01/2018   Smoker 12/30/2016   Syncope and collapse 05/16/2016   Thoracic outlet syndrome of left thoracic outlet 02/15/2018    Thoracic radiculopathy due to degenerative joint disease of spine 10/13/2016   CT and XR show at least 3 levels of DDD of thoracic spine  I suspect this is related to both the scoliosis and the history of dance   Urticaria     Allergies  Allergen Reactions   Bupropion Itching and Other (See Comments)     Review of Systems:  No headache, visual changes, nausea, vomiting, diarrhea, constipation, dizziness, abdominal pain, skin rash, fevers, chills, night sweats, weight loss, swollen lymph nodes, body aches, joint swelling, chest pain, shortness of breath, mood changes. POSITIVE muscle aches  Objective  There were no vitals taken for this visit.   General: No apparent distress alert and oriented x3 mood and affect normal, dressed appropriately.  HEENT: Pupils equal, extraocular movements intact  Respiratory: Patient's speak in full sentences and does not appear short of breath  Cardiovascular: No lower extremity edema, non tender, no erythema  Gait MSK:  Back   Osteopathic findings  C2 flexed rotated and side bent right C6 flexed rotated and side bent left T3 extended rotated and side bent right inhaled rib T9 extended rotated and side bent left L2 flexed rotated and side bent right Sacrum right on right       Assessment and Plan:  No problem-specific Assessment & Plan notes found for this encounter.  Nonallopathic problems  Decision today to treat with OMT was based on Physical Exam  After verbal consent patient was treated with HVLA, ME, FPR techniques in cervical, rib, thoracic, lumbar, and sacral  areas  Patient tolerated the procedure well with improvement in symptoms  Patient given exercises, stretches and lifestyle modifications  See medications in patient instructions if given  Patient will follow up in 4-8 weeks             Note: This dictation was prepared with Dragon dictation along with smaller phrase technology. Any transcriptional errors  that result from this process are unintentional.

## 2023-12-14 ENCOUNTER — Ambulatory Visit: Admitting: Family Medicine

## 2023-12-16 ENCOUNTER — Other Ambulatory Visit (INDEPENDENT_AMBULATORY_CARE_PROVIDER_SITE_OTHER): Payer: Self-pay | Admitting: Family Medicine

## 2023-12-16 DIAGNOSIS — R7303 Prediabetes: Secondary | ICD-10-CM

## 2023-12-16 DIAGNOSIS — E559 Vitamin D deficiency, unspecified: Secondary | ICD-10-CM

## 2023-12-29 ENCOUNTER — Ambulatory Visit (INDEPENDENT_AMBULATORY_CARE_PROVIDER_SITE_OTHER): Admitting: Family Medicine

## 2023-12-29 ENCOUNTER — Encounter (INDEPENDENT_AMBULATORY_CARE_PROVIDER_SITE_OTHER): Payer: Self-pay | Admitting: Family Medicine

## 2023-12-29 VITALS — BP 117/67 | HR 75 | Temp 98.2°F | Ht 64.0 in | Wt 187.0 lb

## 2023-12-29 DIAGNOSIS — E66812 Obesity, class 2: Secondary | ICD-10-CM

## 2023-12-29 DIAGNOSIS — Z6832 Body mass index (BMI) 32.0-32.9, adult: Secondary | ICD-10-CM | POA: Diagnosis not present

## 2023-12-29 DIAGNOSIS — E559 Vitamin D deficiency, unspecified: Secondary | ICD-10-CM

## 2023-12-29 DIAGNOSIS — R7303 Prediabetes: Secondary | ICD-10-CM | POA: Diagnosis not present

## 2023-12-29 DIAGNOSIS — Z6833 Body mass index (BMI) 33.0-33.9, adult: Secondary | ICD-10-CM

## 2023-12-29 MED ORDER — VITAMIN D (ERGOCALCIFEROL) 1.25 MG (50000 UNIT) PO CAPS: 50000.0000 [IU] | ORAL_CAPSULE | ORAL | 0 refills | Status: DC

## 2023-12-29 MED ORDER — METFORMIN HCL 500 MG PO TABS: ORAL_TABLET | ORAL | 0 refills | Status: DC

## 2023-12-29 MED ORDER — TIRZEPATIDE-WEIGHT MANAGEMENT 10 MG/0.5ML ~~LOC~~ SOLN: 10.0000 mg | SUBCUTANEOUS | 0 refills | Status: DC

## 2023-12-29 NOTE — Progress Notes (Signed)
 Sheri [name], D.O.  ABFM, ABOM Specializing in Clinical Bariatric Medicine  Office located at: [address] [place], [place]  [address]   Assessment and Plan:   Medications Discontinued During This Encounter  Medication Reason   tirzepatide  7.5 MG/0.5ML injection vial Dose change   metFORMIN  (GLUCOPHAGE ) 500 MG tablet Reorder   Vitamin D , Ergocalciferol , (DRISDOL ) 1.25 MG (50000 UNIT) CAPS capsule Reorder    Meds ordered this encounter  Medications   metFORMIN  (GLUCOPHAGE ) 500 MG tablet    Sig: 2 po BID    Dispense:  120 tablet    Refill:  0   Vitamin D , Ergocalciferol , (DRISDOL ) 1.25 MG (50000 UNIT) CAPS capsule    Sig: Take 1 capsule (50,000 Units total) by mouth every 7 (seven) days.    Dispense:  4 capsule    Refill:  0   tirzepatide  10 MG/0.5ML injection vial    Sig: Inject 10 mg into the skin once a week.    Dispense:  2 mL    Refill:  0    Patient declines labs today due to orthostatic hypotension  FOR THE DISEASE OF OBESITY:  Class 2 severe obesity with serious comorbidity and body mass index (BMI) of 36.0 to 36.9 in adult, unspecified obesity type (HCC) Starting BMI 36.73 on 09/15/22; BMI 33.0-33.9,adult Current BMI 32.08 Assessment & Plan: Since last office visit on 12/06/2023 patient's muscle mass has decreased by 2.4 lbs. Fat mass has increased by 1.8 lbs. Total body water has increased by 1 lbs.  Body fat % has increased by 1 %. Counseling done on how various foods will affect these numbers and how to maximize success  Total lbs lost to date: 27 lbs Total weight loss percentage to date: 12.62 %   Recommended Dietary Goals Sheri [name] is currently in the action stage of change. As such, her goal is to continue weight management plan.  She has agreed to: continue current plan   Behavioral Intervention We discussed the following today: work on meal planning and preparation, high-protein options to consider incorporating (I.e edamame spaghetti or  black bean spaghetti & egg whites)  Additional resources provided today: None  Evidence-based interventions for health behavior change were utilized today including the discussion of self monitoring techniques, problem-solving barriers and SMART goal setting techniques.   Regarding patient's less desirable eating habits and patterns, we employed the technique of small changes.   Goal(s) for next OV: Increase protein intake    Recommended Physical Activity Goals [name] has been advised to work up to 300-450 minutes of moderate intensity aerobic activity a week and strengthening exercises 2-3 times per week for cardiovascular health, weight loss maintenance and preservation of muscle mass.   She has agreed to: Think about enjoyable ways to increase daily physical activity and overcoming barriers to exercise, Increase physical activity in their day and reduce sedentary time (increase NEAT)., Increase volume of physical activity to a goal of 240 minutes a week, and Combine aerobic and strengthening exercises for efficiency and improved cardiometabolic health.  Pharmacotherapy We both agreed to: Continue with current nutritional and behavioral strategies and Increase Metformin  500 mg 4x daily as needed 1 week prior to menses.  ASSOCIATED CONDITIONS ADDRESSED TODAY:  Prediabetes Goal is HgbA1c < 5.7, with her most recent in February being 5.6%, and fasting Insulin  goal is 5, with most recent being 3x that.  Medication: Tirzepatide  7.5 mg weekly and Metformin  500 mg 3x daily, which she denies having any side effects with.  She does, however, mention that she thinks she would benefit from increasing Metformin  to 4x daily again as she's noticed that she tends to experience strong cravings around her menstrual cycle. She will continue to focus on protein-rich, low simple carbohydrate foods. We reviewed the importance of hydration, regular exercise for stress reduction, and restorative sleep. Recommended  increasing Metformin  500 mg 4x daily as needed 1 week prior to menses. Refilling Metformin  and Tirzepatide  today. Lab Results  Component Value Date   HGBA1C 5.6 05/25/2023   HGBA1C 5.7 (H) 09/15/2022   HGBA1C 6.3 01/06/2022   Lab Results  Component Value Date   INSULIN  15.9 05/25/2023   INSULIN  20.1 09/15/2022   Vitamin-D Deficiency Goal is 50-70 - in February her Vitamin-D was low normal. Continue to take prescription Vitamin D  50,000 IU every week as prescribed. Refilled this today. Lab Results  Component Value Date   VD25OH 56.9 05/25/2023   Follow up:   Return in 4 weeks (on 01/26/2024). She was informed of the importance of frequent follow up visits to maximize her success with intensive lifestyle modifications for her multiple health conditions.  Subjective:    Chief complaint: Obesity [name] is here to discuss her progress with her obesity treatment plan. She is on  the Category 2 Plan and journaling 1200-1300 calories and 90+ grams protein daily and states she is following her eating plan approximately 50% of the time. Pt is swimming 20-30 minutes 3 days per week.    Interval History:  Sheri [name] is here for a follow up office visit. Pt has experienced a weight loss of 1 LB since last OV on 12/06/2023. She says that she is feeling okay, but last week she was experiencing PMS, feeling bloated, not satiated and craving. To combat cravings, she does usually have frequent, high protein snacks. She does additionally note that she has started taking Magnesium  and B12/Folate for symptoms of restless legs.    Pharmacotherapy that aid with weight loss: She is currently taking Metformin  and Tirzepatide  with adequate clinical response and without side effects.    Review of Systems:  Pertinent positives were addressed with patient today.  Reviewed by clinician on day of visit: allergies, medications, problem list, medical history, surgical history, family history,  social history, and previous encounter notes.  Weight Summary and Biometrics   Weight Lost Since Last Visit: 1lb  Weight Gained Since Last Visit: 0   Vitals Temp: 98.2 F (36.8 C) BP: 117/67 Pulse Rate: 75 SpO2: 98 %   Anthropometric Measurements Height: 5' 4 (1.626 m) Weight: 187 lb (84.8 kg) BMI (Calculated): 32.08 Weight at Last Visit: 188lb Weight Lost Since Last Visit: 1lb Weight Gained Since Last Visit: 0 Starting Weight: 214lb Total Weight Loss (lbs): 27 lb (12.2 kg) Peak Weight: 225lb   Body Composition  Body Fat %: 39.8 % Fat Mass (lbs): 74.8 lbs Muscle Mass (lbs): 107.2 lbs Total Body Water (lbs): 76.4 lbs Visceral Fat Rating : 8   Other Clinical Data Fasting: no Labs: no Today's Visit #: 20 Starting Date: 09/15/22    Objective:   PHYSICAL EXAM: Blood pressure 117/67, pulse 75, temperature 98.2 F (36.8 C), height 5' 4 (1.626 m), weight 187 lb (84.8 kg), SpO2 98%. Body mass index is 32.1 kg/m.  General: she is overweight, cooperative and in no acute distress. PSYCH: Has normal mood, affect and thought process.   HEENT: EOMI, sclerae are anicteric. Lungs: Normal breathing effort, no conversational dyspnea. Extremities: Moves * 4 Neurologic: A  and O * 3, good insight  DIAGNOSTIC DATA REVIEWED: BMET    Component Value Date/Time   NA 139 05/25/2023 0857   K 4.2 05/25/2023 0857   CL 102 05/25/2023 0857   CO2 21 05/25/2023 0857   GLUCOSE 84 05/25/2023 0857   GLUCOSE 73 01/06/2022 1518   BUN 13 05/25/2023 0857   CREATININE 0.85 05/25/2023 0857   CALCIUM 9.6 05/25/2023 0857   GFRNONAA >60 05/17/2016 0453   GFRAA >60 05/17/2016 0453   Lab Results  Component Value Date   HGBA1C 5.6 05/25/2023   HGBA1C 5.5 02/13/2015   Lab Results  Component Value Date   INSULIN  15.9 05/25/2023   INSULIN  20.1 09/15/2022   Lab Results  Component Value Date   TSH 2.400 09/15/2022   CBC    Component Value Date/Time   WBC 7.7 09/15/2022 0939    WBC 9.5 01/06/2022 1518   RBC 4.52 09/15/2022 0939   RBC 4.61 01/06/2022 1518   HGB 12.9 09/15/2022 0939   HCT 39.2 09/15/2022 0939   PLT 334 09/15/2022 0939   MCV 87 09/15/2022 0939   MCH 28.5 09/15/2022 0939   MCH 29.6 12/12/2019 0600   MCHC 32.9 09/15/2022 0939   MCHC 33.2 01/06/2022 1518   RDW 13.2 09/15/2022 0939   Iron  Studies    Component Value Date/Time   IRON  54 06/12/2020 1045   FERRITIN 12.1 06/12/2020 1045   IRONPCTSAT 11.7 (L) 06/12/2020 1045   Lipid Panel     Component Value Date/Time   CHOL 237 (H) 05/25/2023 0857   TRIG 151 (H) 05/25/2023 0857   HDL 50 05/25/2023 0857   CHOLHDL 4.7 (H) 05/25/2023 0857   CHOLHDL 4 06/12/2020 1045   VLDL 27.8 06/12/2020 1045   LDLCALC 160 (H) 05/25/2023 0857   LDLDIRECT 150.0 02/13/2015 1449   Hepatic Function Panel     Component Value Date/Time   PROT 7.8 05/25/2023 0857   ALBUMIN 4.5 05/25/2023 0857   AST 12 05/25/2023 0857   ALT 14 05/25/2023 0857   ALKPHOS 68 05/25/2023 0857   BILITOT 0.4 05/25/2023 0857   BILIDIR 0.1 01/06/2022 1518      Component Value Date/Time   TSH 2.400 09/15/2022 0939   Nutritional Lab Results  Component Value Date   VD25OH 56.9 05/25/2023   VD25OH 32.6 09/15/2022    Attestations:   I, Damien Blanks, acting as a Stage manager for Sheri Jenkins, DO., have compiled all relevant documentation for today's office visit on behalf of Sheri Jenkins, DO, while in the presence of [employer], DO.  I have spent 30 minutes in the care of the patient today including 22 minutes face-to-face assessing and reviewing listed medical problems above as outlined in office visit note and providing nutritional and behavioral counseling as outlined in obesity care plan.   I have reviewed the above documentation for accuracy and completeness, and I agree with the above. Sheri JINNY [name], D.O.  The 21st Century Cures Act was signed into law in 2016 which includes the topic of electronic health  records.  This provides immediate access to information in MyChart.  This includes consultation notes, operative notes, office notes, lab results and pathology reports.  If you have any questions about what you read please let us  know at your next visit so we can discuss your concerns and take corrective action if need be.  We are right here with you.

## 2024-01-03 ENCOUNTER — Telehealth (INDEPENDENT_AMBULATORY_CARE_PROVIDER_SITE_OTHER): Payer: Self-pay | Admitting: Family Medicine

## 2024-01-03 NOTE — Telephone Encounter (Signed)
 Pt called in about Zepbound , stating that it was sent to [facility] on 12/29/23 but it is not processing after 72 hours. Please follow up with the pt.

## 2024-01-24 ENCOUNTER — Other Ambulatory Visit (INDEPENDENT_AMBULATORY_CARE_PROVIDER_SITE_OTHER): Payer: Self-pay | Admitting: Family Medicine

## 2024-01-26 ENCOUNTER — Ambulatory Visit (INDEPENDENT_AMBULATORY_CARE_PROVIDER_SITE_OTHER): Admitting: Family Medicine

## 2024-01-26 ENCOUNTER — Encounter (INDEPENDENT_AMBULATORY_CARE_PROVIDER_SITE_OTHER): Payer: Self-pay | Admitting: Family Medicine

## 2024-01-26 VITALS — BP 117/79 | HR 81 | Temp 98.3°F | Ht 64.0 in | Wt 182.0 lb

## 2024-01-26 DIAGNOSIS — E538 Deficiency of other specified B group vitamins: Secondary | ICD-10-CM | POA: Diagnosis not present

## 2024-01-26 DIAGNOSIS — R7303 Prediabetes: Secondary | ICD-10-CM

## 2024-01-26 DIAGNOSIS — E66812 Obesity, class 2: Secondary | ICD-10-CM | POA: Diagnosis not present

## 2024-01-26 DIAGNOSIS — Z6833 Body mass index (BMI) 33.0-33.9, adult: Secondary | ICD-10-CM

## 2024-01-26 DIAGNOSIS — Z6836 Body mass index (BMI) 36.0-36.9, adult: Secondary | ICD-10-CM

## 2024-01-26 DIAGNOSIS — Z6831 Body mass index (BMI) 31.0-31.9, adult: Secondary | ICD-10-CM

## 2024-01-26 DIAGNOSIS — E559 Vitamin D deficiency, unspecified: Secondary | ICD-10-CM | POA: Diagnosis not present

## 2024-01-26 MED ORDER — VITAMIN D (ERGOCALCIFEROL) 1.25 MG (50000 UNIT) PO CAPS
50000.0000 [IU] | ORAL_CAPSULE | ORAL | 0 refills | Status: DC
Start: 2024-01-26 — End: 2024-02-24

## 2024-01-26 MED ORDER — TIRZEPATIDE-WEIGHT MANAGEMENT 10 MG/0.5ML ~~LOC~~ SOLN: 10.0000 mg | SUBCUTANEOUS | 0 refills | Status: DC

## 2024-01-26 MED ORDER — METFORMIN HCL 500 MG PO TABS: ORAL_TABLET | ORAL | 0 refills | Status: DC

## 2024-01-26 MED ORDER — CYANOCOBALAMIN 500 MCG PO TABS: 500.0000 ug | ORAL_TABLET | Freq: Every day | ORAL | Status: AC

## 2024-01-26 NOTE — Progress Notes (Signed)
 Sheri [name], D.O.  ABFM, ABOM Specializing in Clinical Bariatric Medicine  Office located at: [address] [place], [place]  [address]    FOR THE CHRONIC DISEASE OF OBESITY:   Class 2 severe obesity with serious comorbidity and body mass index (BMI) of 36.0 to 36.9 in adult,Starting BMI 36.73 on 09/15/22; BMI 33.0-33.9,adult Current BMI 31.22  Weight Summary and Body Composition Analysis (BIA)   Weight Lost Since Last Visit: 5lb  Weight Gained Since Last Visit: 0lb    Vitals Temp: 98.3 F (36.8 C) BP: 117/79 Pulse Rate: 81 SpO2: 99 %   Anthropometric Measurements Height: 5' 4 (1.626 m) Weight: 182 lb (82.6 kg) BMI (Calculated): 31.22 Weight at Last Visit: 187lb Weight Lost Since Last Visit: 5lb Weight Gained Since Last Visit: 0lb Starting Weight: 214lb Total Weight Loss (lbs): 32 lb (14.5 kg) Peak Weight: 225lb   Body Composition  Body Fat %: 37.2 % Fat Mass (lbs): 67.8 lbs Muscle Mass (lbs): 108.6 lbs Total Body Water (lbs): 74.6 lbs Visceral Fat Rating : 7   Other Clinical Data Fasting: no Labs: no Today's Visit #: 21 Starting Date: 09/15/22    Chief complaint: Obesity  Interval History Sheri [name] is here for a follow-up office visit to discuss her progress with her obesity treatment plan. She is on the Category 2 Plan and keeping a food journal and adhering to recommended goals of 1200-1300 calories and 90+ grams protein and states she is following her eating plan approximately 70 % of the time. She is  walking and doing weight training 60   minutes 3-4 days per week  She has experienced a weight loss of 5 lbs since last OV on 12/29/2023.   Her dietary and life habits include:  - Tracking Calories/Macros: not currently  - Eating More Whole Foods: yes  - Adequate Protein Intake: yes  - Adequate Water Intake: yes  - Skipping Meals: only skipping meals once in a while  - Sleeping 7-9 Hours/ Night: no, because she has a  small child who wakes her up    12/29/23 08:00 01/26/24 08:00   Body Fat % 39.8 % 37.2 %  Muscle Mass (lbs) 107.2 lbs 108.6 lbs  Fat Mass (lbs) 74.8 lbs 67.8 lbs  Total Body Water (lbs) 76.4 lbs 74.6 lbs  Visceral Fat Rating  8 7   Counseling done on how various foods will affect these numbers and how to maximize success  Total lbs lost to date: -32 lbs Total Fat Mass lost to date: - 21.2 lbs Total weight loss percentage to date: -14.95 %   Nutritional and Behavioral Counseling:  We discussed the following today: sleeping 7-9/hrs per night, increasing lean protein intake to established goals, increasing fiber rich foods, continue to work on implementation of reduced calorie nutritional plan, and focusing on food with a 10:1 ratio of calories: grams of protein  Additional resources provided today: none  Evidence-based interventions for health behavior change were utilized today including the discussion of self monitoring techniques, problem-solving barriers and SMART goal setting techniques.   Regarding patient's less desirable eating habits and patterns, we employed the technique of small changes.   SMART Goal(s) created today: increasing fiber intake   Recommended Dietary Goals Sheri [name] is currently in the action stage of change. As such, her goal is to continue weight management plan.  She has agreed to continue the CAT 2 MP and journaling 1200-1300 cal and 90+ grams of protein daily.  Recommended Physical Activity Goals [name] has been advised to work up to 300-450 minutes of moderate intensity aerobic activity a week and strengthening exercises 2-3 times per week for cardiovascular health, weight loss maintenance and preservation of muscle mass.   She has agreed to: Continue to gradually increase the amount and intensity of exercise routine   Medical Interventions and Pharmacotherapy Previous Bariatric surgery: n/a Pharmacotherapy for weight loss: She has a long  history of IBS with symptoms including loose stools. She has loose stools 5-6x per day despite adhering to her prudent nutritional plan the majority of the time. She is taking Metamucil as she feels Miralax was too strong for her. She does not feel the Tirzepatide  or Metformin  are contributing to the loose stools and desires to maintain at the same doses. Encouraged adequate hydration and fiber intake. Cont to decrease simple carbs/ sugars; increase proteins.    OBESITY RELATED CONDITIONS ADDRESSED TODAY:   Medications Discontinued During This Encounter  Medication Reason   cyanocobalamin  (VITAMIN B12) 500 MCG tablet Reorder   metFORMIN  (GLUCOPHAGE ) 500 MG tablet Reorder   Vitamin D , Ergocalciferol , (DRISDOL ) 1.25 MG (50000 UNIT) CAPS capsule Reorder   tirzepatide  10 MG/0.5ML injection vial Reorder     Meds ordered this encounter  Medications   metFORMIN  (GLUCOPHAGE ) 500 MG tablet    Sig: 2 po BID    Dispense:  120 tablet    Refill:  0   tirzepatide  10 MG/0.5ML injection vial    Sig: Inject 10 mg into the skin once a week.    Dispense:  2 mL    Refill:  0   Vitamin D , Ergocalciferol , (DRISDOL ) 1.25 MG (50000 UNIT) CAPS capsule    Sig: Take 1 capsule (50,000 Units total) by mouth every 7 (seven) days.    Dispense:  4 capsule    Refill:  0   cyanocobalamin  (VITAMIN B12) 500 MCG tablet    Sig: Take 1 tablet (500 mcg total) by mouth daily.     Next OV recheck fasting insulin , A1c, BMP, Vit D, and B12. She will be getting lipid panel through cardiology.    Prediabetes Assessment & Plan: Lab Results  Component Value Date   HGBA1C 5.6 05/25/2023   HGBA1C 5.7 (H) 09/15/2022   HGBA1C 6.3 01/06/2022   INSULIN  15.9 05/25/2023   INSULIN  20.1 09/15/2022    Pt takes Metformin  500 mg TID the majority of the time, but increases to 2 po BID one week prior to her menses. Denies adverse side effects. Good control of hunger and cravings. Cont Metformin  therapy and working on nutrition plan  to decrease simple carbohydrates, increase lean proteins and exercise to promote weight loss and improve glycemic control and prevent progression to T2DM. Recheck labs next OV.    Vitamin D  deficiency Assessment & Plan: Lab Results  Component Value Date   VD25OH 56.9 05/25/2023   VD25OH 32.6 09/15/2022   Currently on Ergocalciferol  50,000 lU wkly with good compliance and tolerance. No acute concerns.  Continue same regimen. Recheck level next OV.     B12 deficiency Assessment & Plan: Lab Results  Component Value Date   VITAMINB12 420 05/25/2023   On OTC B12 500 mcg daily. Cont supplementation and nutrient rich diet. Recheck level next OV.    Follow up:   Return 02/24/2024 at 7:40 AM.  She was informed of the importance of frequent follow up visits to maximize her success with intensive lifestyle modifications for her multiple health conditions.   Objective:  PHYSICAL EXAM: Blood pressure 117/79, pulse 81, temperature 98.3 F (36.8 C), height 5' 4 (1.626 m), weight 182 lb (82.6 kg), SpO2 99%. Body mass index is 31.24 kg/m.  General: she is overweight, cooperative and in no acute distress. PSYCH: Has normal mood, affect and thought process.   HEENT: EOMI, sclerae are anicteric. Lungs: Normal breathing effort, no conversational dyspnea. Extremities: Moves * 4 Neurologic: A and O * 3, good insight  DIAGNOSTIC DATA REVIEWED: BMET    Component Value Date/Time   NA 139 05/25/2023 0857   K 4.2 05/25/2023 0857   CL 102 05/25/2023 0857   CO2 21 05/25/2023 0857   GLUCOSE 84 05/25/2023 0857   GLUCOSE 73 01/06/2022 1518   BUN 13 05/25/2023 0857   CREATININE 0.85 05/25/2023 0857   CALCIUM 9.6 05/25/2023 0857   GFRNONAA >60 05/17/2016 0453   GFRAA >60 05/17/2016 0453   Lab Results  Component Value Date   HGBA1C 5.6 05/25/2023   HGBA1C 5.5 02/13/2015   Lab Results  Component Value Date   INSULIN  15.9 05/25/2023   INSULIN  20.1 09/15/2022   Lab Results   Component Value Date   TSH 2.400 09/15/2022   CBC    Component Value Date/Time   WBC 7.7 09/15/2022 0939   WBC 9.5 01/06/2022 1518   RBC 4.52 09/15/2022 0939   RBC 4.61 01/06/2022 1518   HGB 12.9 09/15/2022 0939   HCT 39.2 09/15/2022 0939   PLT 334 09/15/2022 0939   MCV 87 09/15/2022 0939   MCH 28.5 09/15/2022 0939   MCH 29.6 12/12/2019 0600   MCHC 32.9 09/15/2022 0939   MCHC 33.2 01/06/2022 1518   RDW 13.2 09/15/2022 0939   Iron  Studies    Component Value Date/Time   IRON  54 06/12/2020 1045   FERRITIN 12.1 06/12/2020 1045   IRONPCTSAT 11.7 (L) 06/12/2020 1045   Lipid Panel     Component Value Date/Time   CHOL 237 (H) 05/25/2023 0857   TRIG 151 (H) 05/25/2023 0857   HDL 50 05/25/2023 0857   CHOLHDL 4.7 (H) 05/25/2023 0857   CHOLHDL 4 06/12/2020 1045   VLDL 27.8 06/12/2020 1045   LDLCALC 160 (H) 05/25/2023 0857   LDLDIRECT 150.0 02/13/2015 1449   Hepatic Function Panel     Component Value Date/Time   PROT 7.8 05/25/2023 0857   ALBUMIN 4.5 05/25/2023 0857   AST 12 05/25/2023 0857   ALT 14 05/25/2023 0857   ALKPHOS 68 05/25/2023 0857   BILITOT 0.4 05/25/2023 0857   BILIDIR 0.1 01/06/2022 1518      Component Value Date/Time   TSH 2.400 09/15/2022 0939   Nutritional Lab Results  Component Value Date   VD25OH 56.9 05/25/2023   VD25OH 32.6 09/15/2022    Attestations:   I, Special Puri, acting as a Stage manager for [employer], DO., have compiled all relevant documentation for today's office visit on behalf of Sheri Jenkins, DO, while in the presence of [employer], DO.  Pertinent positives were addressed with patient today. Reviewed by clinician on day of visit: allergies, medications, problem list, medical history, surgical history, family history, social history, and previous encounter notes.  I have reviewed the above documentation for accuracy and completeness, and I agree with the above. Sheri [name], D.O.  The 21st Century Cures  Act was signed into law in 2016 which includes the topic of electronic health records.  This provides immediate access to information in MyChart. This includes consultation notes, operative notes, office notes, lab  results and pathology reports.  If you have any questions about what you read please let us  know at your next visit so we can discuss your concerns and take corrective action if need be.  We are right here with you.

## 2024-02-06 ENCOUNTER — Other Ambulatory Visit (HOSPITAL_COMMUNITY): Payer: Self-pay | Admitting: Psychiatry

## 2024-02-06 DIAGNOSIS — F419 Anxiety disorder, unspecified: Secondary | ICD-10-CM

## 2024-02-06 DIAGNOSIS — F39 Unspecified mood [affective] disorder: Secondary | ICD-10-CM

## 2024-02-07 ENCOUNTER — Telehealth (HOSPITAL_COMMUNITY): Admitting: Psychiatry

## 2024-02-07 ENCOUNTER — Encounter (HOSPITAL_COMMUNITY): Payer: Self-pay | Admitting: Psychiatry

## 2024-02-07 VITALS — Wt 186.0 lb

## 2024-02-07 DIAGNOSIS — F419 Anxiety disorder, unspecified: Secondary | ICD-10-CM

## 2024-02-07 DIAGNOSIS — F39 Unspecified mood [affective] disorder: Secondary | ICD-10-CM

## 2024-02-07 MED ORDER — BUSPIRONE HCL 15 MG PO TABS: 15.0000 mg | ORAL_TABLET | Freq: Two times a day (BID) | ORAL | 0 refills | Status: DC

## 2024-02-07 MED ORDER — LAMOTRIGINE 100 MG PO TABS: 100.0000 mg | ORAL_TABLET | Freq: Every day | ORAL | 0 refills | Status: DC

## 2024-02-07 MED ORDER — SERTRALINE HCL 50 MG PO TABS: 75.0000 mg | ORAL_TABLET | Freq: Every day | ORAL | 0 refills | Status: DC

## 2024-02-07 NOTE — Progress Notes (Signed)
 [facility] MD Virtual Progress Note   Patient Location: Home Provider Location: Home Office  I connect with patient by video and verified that I am speaking with correct person by using two identifiers. I discussed the limitations of evaluation and management by telemedicine and the availability of in person appointments. I also discussed with the patient that there may be a patient responsible charge related to this service. The patient expressed understanding and agreed to proceed.  Sheri [name] [id number] 33 y.o.  02/07/2024 1:09 PM  History of Present Illness:  Patient is evaluated by video session.  She reported things are going very well.  She reported school started and things are on routine.  She reported weight loss program going very well and she had lost more than 14 pounds since the last visit.  She is now on Zepbound .  Her anxiety is not as bad and she denies any major panic attack.  Since stopped the Topamax  more than 3 months ago her attention concentration is much better.  She had a good support from her husband.  Her stepson is now teenager.  Patient also had a 4-year-old daughter.  She denies any hallucination, paranoia, suicidal thoughts.  She like to keep the current medication.  She noticed increase Zoloft  had to help her anxiety.  She is also on Lamictal  and BuSpar .  Past Psychiatric History: H/O depression, anxiety, Bipolar, post partum and ADHD. On medication since age 19.  H/O addiction with opiates, Benzo and Adderall. Received therapy and medications at [facility] counseling and [facility] but not happy with care. Had rehab at [facility] in 2015.  Did well for 5 years without medication until symptoms returns. H/O suicidal thoughts but no attempt. No psychosis.  Tried Celexa caused dizziness, trazodone, weight gain.  Took Lamictal , Klonopin, Lexapro, hydroxyzine , Ambien , Seroquel, Latuda, Hydroxyzine  and Wellbutrin.      Past Medical  History:  Diagnosis Date   Alcohol addiction (HCC)    Anemia    Anxiety    Back pain    Bipolar depression (HCC)    under psych rx    Carpal tunnel syndrome    Chest pain    Depression    Drug addiction in remission [facility])    heroin   Enteritis due to Norovirus 05/18/2016   Heartburn    Hx of pulmonary embolus 02/13/2015   Hyperlipidemia    Hypokalemia 05/17/2016   IUD (intrauterine device) in place 02/13/2015   Joint pain    Mild mitral regurgitation    Mild tricuspid regurgitation    Mitral regurgitation 06/13/2018   Near syncope 05/16/2016   a. felt due to norovirus.   Nonallopathic lesion of cervical region 09/01/2018   Nonallopathic lesion of rib cage 09/01/2018   Obesity    Orthostatic hypotension 05/18/2016   Palpitations    PCOS (polycystic ovarian syndrome) 02/13/2015   Prediabetes    Pulmonary embolism (HCC) 2011   neg heme evaluation  felt to be from ocps    Recurrent upper respiratory infection (URI)    Scapular dyskinesis 09/01/2018   Smoker 12/30/2016   Syncope and collapse 05/16/2016   Thoracic outlet syndrome of left thoracic outlet 02/15/2018   Thoracic radiculopathy due to degenerative joint disease of spine 10/13/2016   CT and XR show at least 3 levels of DDD of thoracic spine  I suspect this is related to both the scoliosis and the history of dance   Urticaria     Outpatient Encounter Medications as of  02/07/2024  Medication Sig   albuterol  (PROAIR  HFA) 108 (90 Base) MCG/ACT inhaler Inhale 2 puffs into the lungs every 6 (six) hours as needed for wheezing or shortness of breath.   Albuterol -Budesonide (AIRSUPRA ) 90-80 MCG/ACT AERO Inhale 2 puffs into the lungs every 4 (four) hours as needed (coughing, wheezing, chest tightness). Do not exceed 12 puffs in 24 hours.   aspirin EC 81 MG tablet Take 81 mg by mouth daily. Swallow whole.   busPIRone  (BUSPAR ) 15 MG tablet Take 1 tablet (15 mg total) by mouth 2 (two) times daily.   cetirizine (ZYRTEC) 10 MG  tablet Take 10 mg by mouth daily.   cyanocobalamin  (VITAMIN B12) 500 MCG tablet Take 1 tablet (500 mcg total) by mouth daily.   fluticasone  furoate-vilanterol (BREO ELLIPTA ) 100-25 MCG/ACT AEPB Inhale 1 puff into the lungs daily. Rinse mouth after each use.   lamoTRIgine  (LAMICTAL ) 100 MG tablet Take 1 tablet (100 mg total) by mouth daily.   meloxicam  (MOBIC ) 15 MG tablet TAKE 1 TABLET (15 MG TOTAL) BY MOUTH DAILY.   metFORMIN  (GLUCOPHAGE ) 500 MG tablet 2 po BID   ondansetron  (ZOFRAN -ODT) 4 MG disintegrating tablet Take 1 tablet (4 mg total) by mouth every 8 (eight) hours as needed for nausea or vomiting.   paragard intrauterine copper IUD IUD 1 each by Intrauterine route once.   sertraline  (ZOLOFT ) 50 MG tablet Take 1.5 tablets (75 mg total) by mouth daily.   tirzepatide  10 MG/0.5ML injection vial Inject 10 mg into the skin once a week.   Vitamin D , Ergocalciferol , (DRISDOL ) 1.25 MG (50000 UNIT) CAPS capsule Take 1 capsule (50,000 Units total) by mouth every 7 (seven) days.   No facility-administered encounter medications on file as of 02/07/2024.    No results found for this or any previous visit (from the past 2160 hours).   Psychiatric Specialty Exam: Physical Exam  Review of Systems  Weight 186 lb (84.4 kg).There is no height or weight on file to calculate BMI.  General Appearance: Casual  Eye Contact:  Good  Speech:  Clear and Coherent  Volume:  Normal  Mood:  Euthymic  Affect:  Appropriate  Thought Process:  Goal Directed  Orientation:  Full (Time, Place, and Person)  Thought Content:  Logical  Suicidal Thoughts:  No  Homicidal Thoughts:  No  Memory:  Immediate;   Good Recent;   Good Remote;   Good  Judgement:  Good  Insight:  Good  Psychomotor Activity:  Normal  Concentration:  Concentration: Good and Attention Span: Good  Recall:  Good  Fund of Knowledge:  Good  Language:  Good  Akathisia:  No  Handed:  Right  AIMS (if indicated):     Assets:  Communication  Skills Desire for Improvement Housing Resilience Social Support Talents/Skills Transportation  ADL's:  Intact  Cognition:  WNL  Sleep:  ok       04/15/2023    8:59 AM 04/30/2022    2:10 PM 01/06/2022    2:45 PM 06/05/2020    1:47 PM 06/02/2016    3:44 PM  Depression screen PHQ 2/9  Decreased Interest 0 0 0 0 0  Down, Depressed, Hopeless 0 0 0 0 0  PHQ - 2 Score 0 0 0 0 0  Altered sleeping 0 0 0    Tired, decreased energy 0 0 1    Change in appetite 0 0 1    Feeling bad or failure about yourself  0 0 0    Trouble concentrating  0 0 0    Moving slowly or fidgety/restless 0 0 0    Suicidal thoughts 0 0 0    PHQ-9 Score 0 0 2    Difficult doing work/chores Not difficult at all Not difficult at all Not difficult at all      Assessment/Plan: Mood disorder - Plan: lamoTRIgine  (LAMICTAL ) 100 MG tablet, sertraline  (ZOLOFT ) 50 MG tablet  Anxiety - Plan: busPIRone  (BUSPAR ) 15 MG tablet, lamoTRIgine  (LAMICTAL ) 100 MG tablet, sertraline  (ZOLOFT ) 50 MG tablet  Patient doing better since Zoloft  dose increased to 75 mg.  She is tolerating very well and reported no tremor or shakes or any side effects.  She is in therapy with [facility].  Since started Zepbound  she had lost more than 15 pounds.  She does not want to change the medication as symptoms are much better and stable on current psychotropic medication.  Denies any major panic attack.  Continue Lamictal  100 mg daily, BuSpar  15 mg 2 times a day and Zoloft  75 mg daily.  Recommend to call back if she has any question or any concern.  Follow-up in 3 months.   Follow Up Instructions:     I discussed the assessment and treatment plan with the patient. The patient was provided an opportunity to ask questions and all were answered. The patient agreed with the plan and demonstrated an understanding of the instructions.   The patient was advised to call back or seek an in-person evaluation if the symptoms worsen or if the condition  fails to improve as anticipated.    Collaboration of Care: Other provider involved in patient's care AEB notes are available in epic to review  Patient/Guardian was advised Release of Information must be obtained prior to any record release in order to collaborate their care with an outside provider. Patient/Guardian was advised if they have not already done so to contact the registration department to sign all necessary forms in order for us  to release information regarding their care.   Consent: Patient/Guardian gives verbal consent for treatment and assignment of benefits for services provided during this visit. Patient/Guardian expressed understanding and agreed to proceed.     Total encounter time 12 minutes which includes face-to-face time, chart reviewed, care coordination, order entry and documentation during this encounter.   Note: This document was prepared by [employer] voice dictation technology and any errors that results from this process are unintentional.    Leni ONEIDA Client, MD 02/07/2024

## 2024-02-11 ENCOUNTER — Other Ambulatory Visit (INDEPENDENT_AMBULATORY_CARE_PROVIDER_SITE_OTHER): Payer: Self-pay | Admitting: Family Medicine

## 2024-02-11 DIAGNOSIS — E559 Vitamin D deficiency, unspecified: Secondary | ICD-10-CM

## 2024-02-15 NOTE — Progress Notes (Unsigned)
 Sheri [name] Sheri [name] [address] [place] [address] Phone: [phone] Subjective:   LILLETTE Berwyn Posey, am serving as a scribe for Dr. Arthea Claudene.  I'm seeing this patient by the request  of:  Panosh, Sheri POUR, MD  CC: back and neck pain follow up   HPI: Sheri [name] is a 33 y.o. female coming in with complaint of back and neck pain. OMT 10/14/2023. Back has been doing ok. Patient states that she is having L hip pain over GT that radiates to the knee. Sitting worsens her pain.   Medications patient has been prescribed: None  Taking:         Reviewed prior external information including notes and imaging from previsou exam, outside providers and external EMR if available.   As well as notes that were available from care everywhere and other healthcare systems.  Past medical history, social, surgical and family history all reviewed in electronic medical record.  No pertanent information unless stated regarding to the chief complaint.   Past Medical History:  Diagnosis Date   Alcohol addiction (HCC)    Anemia    Anxiety    Back pain    Bipolar depression (HCC)    under psych rx    Carpal tunnel syndrome    Chest pain    Depression    Drug addiction in remission [facility])    heroin   Enteritis due to Norovirus 05/18/2016   Heartburn    Hx of pulmonary embolus 02/13/2015   Hyperlipidemia    Hypokalemia 05/17/2016   IUD (intrauterine device) in place 02/13/2015   Joint pain    Mild mitral regurgitation    Mild tricuspid regurgitation    Mitral regurgitation 06/13/2018   Near syncope 05/16/2016   a. felt due to norovirus.   Nonallopathic lesion of cervical region 09/01/2018   Nonallopathic lesion of rib cage 09/01/2018   Obesity    Orthostatic hypotension 05/18/2016   Palpitations    PCOS (polycystic ovarian syndrome) 02/13/2015   Prediabetes    Pulmonary embolism (HCC) 2011   neg heme evaluation  felt to be from ocps     Recurrent upper respiratory infection (URI)    Scapular dyskinesis 09/01/2018   Smoker 12/30/2016   Syncope and collapse 05/16/2016   Thoracic outlet syndrome of left thoracic outlet 02/15/2018   Thoracic radiculopathy due to degenerative joint disease of spine 10/13/2016   CT and XR show at least 3 levels of DDD of thoracic spine  I suspect this is related to both the scoliosis and the history of dance   Urticaria     Allergies  Allergen Reactions   Bupropion Itching and Other (See Comments)     Review of Systems:  No headache, visual changes, nausea, vomiting, diarrhea, constipation, dizziness, abdominal pain, skin rash, fevers, chills, night sweats, weight loss, swollen lymph nodes, body aches, joint swelling, chest pain, shortness of breath, mood changes. POSITIVE muscle aches  Objective  Blood pressure 112/78, pulse 89, height 5' 4 (1.626 m), weight 186 lb (84.4 kg), SpO2 100%.   General: No apparent distress alert and oriented x3 mood and affect normal, dressed appropriately.  HEENT: Pupils equal, extraocular movements intact  Respiratory: Patient's speak in full sentences and does not appear short of breath  Cardiovascular: No lower extremity edema, non tender, no erythema  Gait relatively normal MSK:  Back does have some loss lordosis noted.  Some tenderness to palpation noted.  More tenderness over the sacroiliac  joint on the left side.  Negative straight leg test noted.  Tightness noted in the paraspinal musculature left side of the paraspinal musculature  Osteopathic findings  C2 flexed rotated and side bent right T9 extended rotated and side bent left inhaled rib L2 flexed rotated and side bent right L3 flexed rotated and side bent left Sacrum left on left     Assessment and Plan:  SI (sacroiliac) joint dysfunction Discussed with patient doing too much weight at the moment.  Trying to increase strength and muscle at the moment.  Has been doing a good amount of  weight loss.  Discussed with patient to increase activity slowly and discussed icing regimen.  Follow-up with me again in 6 to 12 weeks.  Discussed protein supplementation.  New exercises given as well to work on this and hip abductor strengthening.    Nonallopathic problems  Decision today to treat with OMT was based on Physical Exam  After verbal consent patient was treated with HVLA, ME, FPR techniques in cervical, rib, thoracic, lumbar, and sacral  areas  Patient tolerated the procedure well with improvement in symptoms  Patient given exercises, stretches and lifestyle modifications  See medications in patient instructions if given  Patient will follow up in 4-8 weeks    The above documentation has been reviewed and is accurate and complete Sheri [name] M Sheri Amero, DO          Note: This dictation was prepared with Dragon dictation along with smaller phrase technology. Any transcriptional errors that result from this process are unintentional.

## 2024-02-16 ENCOUNTER — Encounter: Payer: Self-pay | Admitting: Family Medicine

## 2024-02-16 ENCOUNTER — Ambulatory Visit: Admitting: Family Medicine

## 2024-02-16 VITALS — BP 112/78 | HR 89 | Ht 64.0 in | Wt 186.0 lb

## 2024-02-16 DIAGNOSIS — M9908 Segmental and somatic dysfunction of rib cage: Secondary | ICD-10-CM | POA: Diagnosis not present

## 2024-02-16 DIAGNOSIS — M9901 Segmental and somatic dysfunction of cervical region: Secondary | ICD-10-CM | POA: Diagnosis not present

## 2024-02-16 DIAGNOSIS — M9903 Segmental and somatic dysfunction of lumbar region: Secondary | ICD-10-CM

## 2024-02-16 DIAGNOSIS — M533 Sacrococcygeal disorders, not elsewhere classified: Secondary | ICD-10-CM

## 2024-02-16 DIAGNOSIS — M9904 Segmental and somatic dysfunction of sacral region: Secondary | ICD-10-CM | POA: Diagnosis not present

## 2024-02-16 DIAGNOSIS — M9902 Segmental and somatic dysfunction of thoracic region: Secondary | ICD-10-CM

## 2024-02-16 NOTE — Patient Instructions (Signed)
 SI jt exercises Remember basketball trick More biking than treadmill Whey protein isolate at least 75g of protein a day See me in 2 months

## 2024-02-16 NOTE — Assessment & Plan Note (Addendum)
 Discussed with patient doing too much weight at the moment.  Trying to increase strength and muscle at the moment.  Has been doing a good amount of weight loss.  Discussed with patient to increase activity slowly and discussed icing regimen.  Follow-up with me again in 6 to 12 weeks.  Discussed protein supplementation.  New exercises given as well to work on this and hip abductor strengthening.

## 2024-02-17 ENCOUNTER — Other Ambulatory Visit: Payer: Self-pay | Admitting: Allergy

## 2024-02-22 ENCOUNTER — Other Ambulatory Visit (INDEPENDENT_AMBULATORY_CARE_PROVIDER_SITE_OTHER): Payer: Self-pay | Admitting: Family Medicine

## 2024-02-24 ENCOUNTER — Ambulatory Visit (INDEPENDENT_AMBULATORY_CARE_PROVIDER_SITE_OTHER): Payer: Self-pay | Admitting: Family Medicine

## 2024-02-24 ENCOUNTER — Encounter (INDEPENDENT_AMBULATORY_CARE_PROVIDER_SITE_OTHER): Payer: Self-pay | Admitting: Family Medicine

## 2024-02-24 VITALS — BP 122/79 | HR 91 | Temp 97.8°F | Ht 64.0 in | Wt 179.0 lb

## 2024-02-24 DIAGNOSIS — E66812 Obesity, class 2: Secondary | ICD-10-CM

## 2024-02-24 DIAGNOSIS — E538 Deficiency of other specified B group vitamins: Secondary | ICD-10-CM | POA: Diagnosis not present

## 2024-02-24 DIAGNOSIS — Z6830 Body mass index (BMI) 30.0-30.9, adult: Secondary | ICD-10-CM

## 2024-02-24 DIAGNOSIS — E559 Vitamin D deficiency, unspecified: Secondary | ICD-10-CM | POA: Diagnosis not present

## 2024-02-24 DIAGNOSIS — R7303 Prediabetes: Secondary | ICD-10-CM

## 2024-02-24 DIAGNOSIS — Z6833 Body mass index (BMI) 33.0-33.9, adult: Secondary | ICD-10-CM

## 2024-02-24 MED ORDER — VITAMIN D (ERGOCALCIFEROL) 1.25 MG (50000 UNIT) PO CAPS: 50000.0000 [IU] | ORAL_CAPSULE | ORAL | 0 refills | Status: DC

## 2024-02-24 MED ORDER — METFORMIN HCL 500 MG PO TABS: ORAL_TABLET | ORAL | 0 refills | Status: DC

## 2024-02-24 MED ORDER — TIRZEPATIDE-WEIGHT MANAGEMENT 10 MG/0.5ML ~~LOC~~ SOLN: 10.0000 mg | SUBCUTANEOUS | 0 refills | Status: DC

## 2024-02-24 NOTE — Progress Notes (Signed)
 Sheri [name], D.O.  ABFM, ABOM Specializing in Clinical Bariatric Medicine  Office located at: [address] [place], [place]  [address]    Review labs at next OV.  A) FOR THE CHRONIC DISEASE OF OBESITY:  Chief complaint: Obesity Sheri [name] is here to discuss her progress with her obesity treatment plan.   History of present illness / Interval history:  Sheri [name] is here today for her follow-up office visit.  Since last OV on 01/26/24, pt is down 3 lbs. Patient states that she is doing well and is excited to see her muscle mass. She endorses drinking 2 to 3 bottles of 32 oz of water.     01/26/24 08:00 02/24/24 07:00   Body Fat % 37.2 % 36.7 %  Muscle Mass (lbs) 108.6 lbs 107.8 lbs  Fat Mass (lbs) 67.8 lbs 66 lbs  Total Body Water (lbs) 74.6 lbs 75.2 lbs  Visceral Fat Rating  7 7    Counseling done on how various foods will affect these numbers and how to maximize success   Total lbs lost to date: - 35 lbs Total Fat Mass in lbs lost to date: - 21.2 lbs Total weight loss percentage to date: - 16.36 %    BMI 33.0-33.9,adult Current BMI 30.71 Class 2 severe obesity with serious comorbidity and body mass index (BMI) of 36.0 to 36.9 in adult, unspecified obesity type (HCC) Starting BMI 36.73 on 09/15/22  Nutrition Therapy She is journaling 1200-1300 cal and 90+ grams of protein daily and states she is following her eating plan approximately 70 % of the time.   - Tracking Calories/Macros: yes  - Eating More Whole Foods: yes  - Adequate Protein Intake: yes  - Adequate Water Intake: yes  - Skipping Meals: no   - Sleeping 7-9 Hours/ Night: yes   Sheri [name] is currently in the action stage of change. As such, her goal is to continue weight management plan.  She has agreed to: continue current plan   Physical Activity Sheri [name] is doing weights 40 minutes 2 to 3 days per week and  cardio 30 minutes 2 to 3 days per week.    Sheri [name] has been  advised to work up to 300-450 minutes of moderate intensity aerobic activity a week and strengthening exercises 2-3 times per week for cardiovascular health, weight loss maintenance and preservation of muscle mass.  She has agreed to : Continue current level of physical activity    Behavioral Modifications Evidence-based interventions for health behavior change were utilized today including the discussion of  1) self monitoring techniques:    - Continue being aware of protein intake   2) SMART goals for next OV:    - Get at least 90 g of protein a day   Regarding patient's less desirable eating habits and patterns, we employed the technique of small changes.   We discussed the following today: increasing lean protein intake to established goals and continue to work on maintaining a reduced calorie state, getting the recommended amount of protein, incorporating whole foods, making healthy choices, staying well hydrated and practicing mindfulness when eating. Additional resources provided today: None   Medical Interventions/ Pharmacotherapy Previous Bariatric surgery: n/a Pharmacotherapy for weight loss: She is currently taking Metformin  2 pill BID and Tirzepatide  10 mg weekly for medical weight loss.    We discussed various medication options to help Sheri [name] with her weight loss efforts and we both agreed to : Adequate clinical response to anti-obesity medication,  continue current regimen  Patient is taking Tirzepatide  10 mg weekly. Patient states that her hunger and cravings are well controlled. Patient endorses feeling that her food noise has decreased and the obsessive thoughts of food have decreased. Continue with medication, will refill today.     B) OBESITY RELATED CONDITIONS ADDRESSED TODAY:  Prediabetes Assessment & Plan Lab Results  Component Value Date   HGBA1C 5.6 05/25/2023   HGBA1C 5.7 (H) 09/15/2022   HGBA1C 6.3 01/06/2022   INSULIN  15.9 05/25/2023   INSULIN  20.1  09/15/2022    Takes Metformin  500 mg twice daily. Good compliance and tolerance. Patient states that her hunger and cravings are well controlled. Patient states that she has been eating on plan but yesterday she gave into a cravings for pizza. Patient endorses feeling more bloated and having more nausea right after eating the pizza. Patient states that she is not taking Metformin  but is using metamucil to help regulate bowel movements. No acute concerns today. Continue with medications and following prudent meal plan. Will obtain labs today and review at next OV.    Vitamin D  deficiency Assessment & Plan Lab Results  Component Value Date   VD25OH 56.9 05/25/2023   VD25OH 32.6 09/15/2022   Taking ERGO 50K units weekly. Good compliance and tolerance.  Will refill today. No acute concerns today. Previous Vit D levels were at goal. Continue with supplementation. Will obtain labs today and review at next OV.      B12 deficiency Assessment & Plan Lab Results  Component Value Date   VITAMINB12 420 05/25/2023   Taking OTC B12 500 mcg daily. With reported good compliance and tolerance. Goal levels are 400,500 or above. Continue with supplementation. Will obtain labs today and review at next OV.    Medications Discontinued During This Encounter  Medication Reason   metFORMIN  (GLUCOPHAGE ) 500 MG tablet Reorder   tirzepatide  10 MG/0.5ML injection vial Reorder   Vitamin D , Ergocalciferol , (DRISDOL ) 1.25 MG (50000 UNIT) CAPS capsule Reorder     Meds ordered this encounter  Medications   metFORMIN  (GLUCOPHAGE ) 500 MG tablet    Sig: 2 po BID    Dispense:  120 tablet    Refill:  0   tirzepatide  10 MG/0.5ML injection vial    Sig: Inject 10 mg into the skin once a week.    Dispense:  2 mL    Refill:  0   Vitamin D , Ergocalciferol , (DRISDOL ) 1.25 MG (50000 UNIT) CAPS capsule    Sig: Take 1 capsule (50,000 Units total) by mouth every 7 (seven) days.    Dispense:  4 capsule    Refill:  0       Follow up:   Return 03/23/2024 at 7:40 AM  She was informed of the importance of frequent follow up visits to maximize her success with intensive lifestyle modifications for her multiple health conditions.   Weight Summary and Biometrics   Weight Lost Since Last Visit: 3  Weight Gained Since Last Visit: 0    Vitals Temp: 97.8 F (36.6 C) BP: 122/79 Pulse Rate: 91 SpO2: 99 %   Anthropometric Measurements Height: 5' 4 (1.626 m) (Simultaneous filing. User may not have seen previous data.) Weight: 179 lb (81.2 kg) BMI (Calculated): 30.71 Weight at Last Visit: 182 lb (Simultaneous filing. User may not have seen previous data.) Weight Lost Since Last Visit: 3 Weight Gained Since Last Visit: 0 Starting Weight: 214 lb (Simultaneous filing. User may not have seen previous data.) Total Weight Loss (lbs):  35 lb (15.9 kg) Peak Weight: 225 lb (Simultaneous filing. User may not have seen previous data.)   Body Composition  Body Fat %: 36.7 % Fat Mass (lbs): 66 lbs Muscle Mass (lbs): 107.8 lbs Total Body Water (lbs): 75.2 lbs Visceral Fat Rating : 7   Other Clinical Data Fasting: yes Labs: ? Today's Visit #: 22 (Simultaneous filing. User may not have seen previous data.) Starting Date: 09/15/22 (Simultaneous filing. User may not have seen previous data.)    Objective:   PHYSICAL EXAM: Blood pressure 122/79, pulse 91, temperature 97.8 F (36.6 C), height 5' 4 (1.626 m), weight 179 lb (81.2 kg), SpO2 99%. Body mass index is 30.73 kg/m.  General: she is overweight, cooperative and in no acute distress. PSYCH: Has normal mood, affect and thought process.   HEENT: EOMI, sclerae are anicteric. Lungs: Normal breathing effort, no conversational dyspnea. Extremities: Moves * 4 Neurologic: A and O * 3, good insight  DIAGNOSTIC DATA REVIEWED: BMET    Component Value Date/Time   NA 139 05/25/2023 0857   K 4.2 05/25/2023 0857   CL 102 05/25/2023 0857   CO2 21  05/25/2023 0857   GLUCOSE 84 05/25/2023 0857   GLUCOSE 73 01/06/2022 1518   BUN 13 05/25/2023 0857   CREATININE 0.85 05/25/2023 0857   CALCIUM 9.6 05/25/2023 0857   GFRNONAA >60 05/17/2016 0453   GFRAA >60 05/17/2016 0453   Lab Results  Component Value Date   HGBA1C 5.6 05/25/2023   HGBA1C 5.5 02/13/2015   Lab Results  Component Value Date   INSULIN  15.9 05/25/2023   INSULIN  20.1 09/15/2022   Lab Results  Component Value Date   TSH 2.400 09/15/2022   CBC    Component Value Date/Time   WBC 7.7 09/15/2022 0939   WBC 9.5 01/06/2022 1518   RBC 4.52 09/15/2022 0939   RBC 4.61 01/06/2022 1518   HGB 12.9 09/15/2022 0939   HCT 39.2 09/15/2022 0939   PLT 334 09/15/2022 0939   MCV 87 09/15/2022 0939   MCH 28.5 09/15/2022 0939   MCH 29.6 12/12/2019 0600   MCHC 32.9 09/15/2022 0939   MCHC 33.2 01/06/2022 1518   RDW 13.2 09/15/2022 0939   Iron  Studies    Component Value Date/Time   IRON  54 06/12/2020 1045   FERRITIN 12.1 06/12/2020 1045   IRONPCTSAT 11.7 (L) 06/12/2020 1045   Lipid Panel     Component Value Date/Time   CHOL 237 (H) 05/25/2023 0857   TRIG 151 (H) 05/25/2023 0857   HDL 50 05/25/2023 0857   CHOLHDL 4.7 (H) 05/25/2023 0857   CHOLHDL 4 06/12/2020 1045   VLDL 27.8 06/12/2020 1045   LDLCALC 160 (H) 05/25/2023 0857   LDLDIRECT 150.0 02/13/2015 1449   Hepatic Function Panel     Component Value Date/Time   PROT 7.8 05/25/2023 0857   ALBUMIN 4.5 05/25/2023 0857   AST 12 05/25/2023 0857   ALT 14 05/25/2023 0857   ALKPHOS 68 05/25/2023 0857   BILITOT 0.4 05/25/2023 0857   BILIDIR 0.1 01/06/2022 1518      Component Value Date/Time   TSH 2.400 09/15/2022 0939   Nutritional Lab Results  Component Value Date   VD25OH 56.9 05/25/2023   VD25OH 32.6 09/15/2022    Attestations:   ISonny Laroche, acting as a stage manager for Sheri Jenkins, DO., have compiled all relevant documentation for today's office visit on behalf of Sheri Jenkins, DO,  while in the presence of [employer], DO.  I have reviewed the above documentation for accuracy and completeness, and I agree with the above. Sheri JINNY [name], D.O.  The 21st Century Cures Act was signed into law in 2016 which includes the topic of electronic health records.  This provides immediate access to information in MyChart.  This includes consultation notes, operative notes, office notes, lab results and pathology reports.  If you have any questions about what you read please let us  know at your next visit so we can discuss your concerns and take corrective action if need be.  We are right here with you.

## 2024-02-25 LAB — T4, FREE: Free T4: 1.29 ng/dL (ref 0.82–1.77)

## 2024-02-25 LAB — BASIC METABOLIC PANEL WITH GFR
BUN/Creatinine Ratio: 21 (ref 9–23)
BUN: 16 mg/dL (ref 6–20)
CO2: 20 mmol/L (ref 20–29)
Calcium: 10 mg/dL (ref 8.7–10.2)
Chloride: 103 mmol/L (ref 96–106)
Creatinine, Ser: 0.78 mg/dL (ref 0.57–1.00)
Glucose: 81 mg/dL (ref 70–99)
Potassium: 4.4 mmol/L (ref 3.5–5.2)
Sodium: 139 mmol/L (ref 134–144)
eGFR: 102 mL/min/1.73 (ref >59)

## 2024-02-25 LAB — HEMOGLOBIN A1C
Est. average glucose Bld gHb Est-mCnc: 103 mg/dL
Hgb A1c MFr Bld: 5.2 % (ref 4.8–5.6)

## 2024-02-25 LAB — TSH: TSH: 2.21 u[IU]/mL (ref 0.450–4.500)

## 2024-02-25 LAB — INSULIN, RANDOM: INSULIN: 11.7 u[IU]/mL (ref 2.6–24.9)

## 2024-02-25 LAB — VITAMIN D 25 HYDROXY (VIT D DEFICIENCY, FRACTURES): Vit D, 25-Hydroxy: 75.5 ng/mL (ref 30.0–100.0)

## 2024-02-25 LAB — VITAMIN B12: Vitamin B-12: >2000 pg/mL — ABNORMAL HIGH (ref 232–1245)

## 2024-03-05 ENCOUNTER — Other Ambulatory Visit (INDEPENDENT_AMBULATORY_CARE_PROVIDER_SITE_OTHER): Payer: Self-pay | Admitting: Family Medicine

## 2024-03-05 DIAGNOSIS — R7303 Prediabetes: Secondary | ICD-10-CM

## 2024-03-11 ENCOUNTER — Other Ambulatory Visit (INDEPENDENT_AMBULATORY_CARE_PROVIDER_SITE_OTHER): Payer: Self-pay | Admitting: Family Medicine

## 2024-03-11 DIAGNOSIS — E559 Vitamin D deficiency, unspecified: Secondary | ICD-10-CM

## 2024-03-15 ENCOUNTER — Ambulatory Visit: Admitting: Family Medicine

## 2024-03-23 ENCOUNTER — Ambulatory Visit (INDEPENDENT_AMBULATORY_CARE_PROVIDER_SITE_OTHER): Payer: Self-pay | Admitting: Family Medicine

## 2024-03-23 ENCOUNTER — Encounter (INDEPENDENT_AMBULATORY_CARE_PROVIDER_SITE_OTHER): Payer: Self-pay | Admitting: Family Medicine

## 2024-03-23 VITALS — BP 119/78 | HR 89 | Temp 98.0°F | Ht 64.0 in | Wt 177.0 lb

## 2024-03-23 DIAGNOSIS — E66812 Obesity, class 2: Secondary | ICD-10-CM

## 2024-03-23 DIAGNOSIS — E559 Vitamin D deficiency, unspecified: Secondary | ICD-10-CM

## 2024-03-23 DIAGNOSIS — E538 Deficiency of other specified B group vitamins: Secondary | ICD-10-CM

## 2024-03-23 DIAGNOSIS — R7303 Prediabetes: Secondary | ICD-10-CM

## 2024-03-23 DIAGNOSIS — Z6833 Body mass index (BMI) 33.0-33.9, adult: Secondary | ICD-10-CM

## 2024-03-23 MED ORDER — VITAMIN D (ERGOCALCIFEROL) 1.25 MG (50000 UNIT) PO CAPS: 50000.0000 [IU] | ORAL_CAPSULE | ORAL | 1 refills | Status: DC

## 2024-03-23 MED ORDER — TIRZEPATIDE-WEIGHT MANAGEMENT 10 MG/0.5ML ~~LOC~~ SOLN: 10.0000 mg | SUBCUTANEOUS | 1 refills | Status: DC

## 2024-03-23 MED ORDER — METFORMIN HCL 500 MG PO TABS: ORAL_TABLET | ORAL | 1 refills | Status: DC

## 2024-03-23 NOTE — Progress Notes (Signed)
 Sheri [name], D.O.  ABFM, ABOM Specializing in Clinical Bariatric Medicine  Office located at: [address] [place], [place]  [address]      A) FOR THE CHRONIC DISEASE OF OBESITY:  Chief complaint: Obesity Sheri [name] is here to discuss her progress with her obesity treatment plan.   History of present illness / Interval history:  Sheri [name] is here today for her follow-up office visit.  Since last OV on 02/24/24, pt is down 2 lbs. She reports that her exercise has not been consistent this month.     02/24/24 07:00 03/23/24 07:00   Body Fat % 36.7 % 37.4 %  Muscle Mass (lbs) 107.8 lbs 105.6 lbs  Fat Mass (lbs) 66 lbs 66.4 lbs  Total Body Water (lbs) 75.2 lbs 73.8 lbs  Visceral Fat Rating  7 7   Counseling done on how various foods will affect these numbers and how to maximize success.   Total lbs lost to date: - 37 lbs Total Fat Mass in lbs lost to date: - 22.6 lbs Total weight loss percentage to date: - 17.29 %    BMI 33.0-33.9,adult Current BMI 30.37 Class 2 severe obesity with serious comorbidity and body mass index (BMI) of 36.0 to 36.9 in adult, unspecified obesity type (HCC) Starting BMI 36.73 on 09/15/22  Nutrition Therapy She is journaling 1200-1300 cal and 90+ grams of protein daily and states she is following her eating plan approximately 70 % of the time.   - Tracking Calories/Macros: no  - Eating More Whole Foods: yes  - Adequate Protein Intake: yes  - Adequate Water Intake: yes  - Skipping Meals: no -   - Sleeping 7-9 Hours/ Night: yes   Sheri [name] is currently in the action stage of change. As such, her goal is to continue weight management plan.  She has agreed to: continue current plan   Physical Activity Sheri [name] is doing weights or cardio 30-60  minutes 3 days per week   Sheri [name] has been advised to work up to 300-450 minutes of moderate intensity aerobic activity a week and strengthening exercises 2-3 times per week  for cardiovascular health, weight loss maintenance and preservation of muscle mass.  She has agreed to : Continue current level of physical activity    Behavioral Modifications Evidence-based interventions for health behavior change were utilized today including the discussion of  1) SMART goals for next OV:    - Maintain weight over the holidays   Regarding patient's less desirable eating habits and patterns, we employed the technique of small changes.   We discussed the following today: increasing lean protein intake to established goals, reading food labels , and continue to work on maintaining a reduced calorie state, getting the recommended amount of protein, incorporating whole foods, making healthy choices, staying well hydrated and practicing mindfulness when eating. Additional resources provided today: None   Medical Interventions/ Pharmacotherapy Previous Bariatric surgery: n/a Pharmacotherapy for weight loss: She is currently taking Metformin  500 mg twice daily and tirzepatide  10 mg weekly  for medical weight loss.    Taking Mounjaro  10 mg weekly. With reported good compliance and tolerance. Patient states that she feels like the medication has been doing well for her. She endorses being purposeful when eating her protein and it has caused a decrease in her cravings. She reports experiencing bloating and had her first issues with constipation. She denies taking Miralax due to previous experience with Miralax and using the restroom up to 6  times a day. Patient also reports that she will have to be off her medication for about 3 weeks due to a procedure she is having and her surgeon told her to no longer take the medication during the time. Explained to patient that she should due as her surgeon recommends and make an appointment back here after to reassess the medication.     We discussed various medication options to help Sheri [name] with her weight loss efforts and we both agreed  to : Adequate clinical response to anti-obesity medication, continue current regimen   B) OBESITY RELATED CONDITIONS ADDRESSED TODAY:  Prediabetes Assessment & Plan Lab Results  Component Value Date   HGBA1C 5.2 02/24/2024   HGBA1C 5.6 05/25/2023   HGBA1C 5.7 (H) 09/15/2022   INSULIN  11.7 02/24/2024   INSULIN  15.9 05/25/2023   INSULIN  20.1 09/15/2022      Component Value Date/Time   NA 139 02/24/2024 0821   K 4.4 02/24/2024 0821   CL 103 02/24/2024 0821   CO2 20 02/24/2024 0821   GLUCOSE 81 02/24/2024 0821   GLUCOSE 73 01/06/2022 1518   BUN 16 02/24/2024 0821   CREATININE 0.78 02/24/2024 0821   CALCIUM 10.0 02/24/2024 0821   PROT 7.8 05/25/2023 0857   ALBUMIN 4.5 05/25/2023 0857   AST 12 05/25/2023 0857   ALT 14 05/25/2023 0857   ALKPHOS 68 05/25/2023 0857   BILITOT 0.4 05/25/2023 0857   GFR 103.53 01/06/2022 1518   EGFR 102 02/24/2024 0821   GFRNONAA >60 05/17/2016 0453   Lab Results  Component Value Date   TSH 2.210 02/24/2024   T3TOTAL 137 09/15/2022   FREET4 1.29 02/24/2024   On Metformin  500 mg twice daily. With reported good compliance and tolerance. Patient states that the Metformin  has not caused any loose stools. A1C have decreased from 5.6 to 5.2. Insulin  levels have also decreased from 15.9 to 11.7 which is still not at goal. Patient endorses eating smaller meals throughout the day and it has helped reduce her craving and hunger. Continue with medications, will refill today and following prudent meal plan decreasing simple carbs ad sugars.    Kidney and thyroid  levels are normal. No acute concerns. Continue to increase exercise and follow meal plan.     Vitamin D  deficiency Assessment & Plan Lab Results  Component Value Date   VD25OH 75.5 02/24/2024   VD25OH 56.9 05/25/2023   VD25OH 32.6 09/15/2022   Taking ERGO 50K units once weekly. With reported good compliance and tolerance. Patients Vit D levels are goal. No acute concerns. Reminded patient  that as she sheds adipose tissue Vit D levels will naturally rise. Continue with supplementation, will refill today.     B12 deficiency Assessment & Plan Last vitamin B12 and Folate Lab Results  Component Value Date   VITAMINB12 >2000 (H) 02/24/2024   FOLATE 13.1 09/15/2022   Taking Cyanocobalamin  500 mcg daily. Patient states that she recently bought a new bottle and is unsure if it still 500 or if is 5000 mcg. B12 levels have increased dramatically. If patient is still taking 500 mcg recommended that she switch to every 3 days instead of every day. At next OV patient will check and bring in her dosage.      Medications Discontinued During This Encounter  Medication Reason   metFORMIN  (GLUCOPHAGE ) 500 MG tablet Reorder   tirzepatide  10 MG/0.5ML injection vial Reorder   Vitamin D , Ergocalciferol , (DRISDOL ) 1.25 MG (50000 UNIT) CAPS capsule Reorder   tirzepatide  10 MG/0.5ML  injection vial      Meds ordered this encounter  Medications   metFORMIN  (GLUCOPHAGE ) 500 MG tablet    Sig: 2 po BID    Dispense:  120 tablet    Refill:  1   DISCONTD: tirzepatide  10 MG/0.5ML injection vial    Sig: Inject 10 mg into the skin once a week.    Dispense:  2 mL    Refill:  1   Vitamin D , Ergocalciferol , (DRISDOL ) 1.25 MG (50000 UNIT) CAPS capsule    Sig: Take 1 capsule (50,000 Units total) by mouth every 7 (seven) days.    Dispense:  4 capsule    Refill:  1   tirzepatide  10 MG/0.5ML injection vial    Sig: Inject 10 mg into the skin once a week.    Dispense:  2 mL    Refill:  1      Follow up:   Return 05/23/2024 at 8:20 AM  She was informed of the importance of frequent follow up visits to maximize her success with intensive lifestyle modifications for her multiple health conditions.   Weight Summary and Biometrics   Weight Lost Since Last Visit: 2lb  Weight Gained Since Last Visit: 0lb   Vitals BP: 119/78 Pulse Rate: 89 SpO2: 100 %   Anthropometric Measurements Height: 5' 4  (1.626 m) Weight: 177 lb (80.3 kg) BMI (Calculated): 30.37 Weight at Last Visit: 179lb Weight Lost Since Last Visit: 2lb Weight Gained Since Last Visit: 0lb Starting Weight: 214lb Total Weight Loss (lbs): 37 lb (16.8 kg) Peak Weight: 225lb   Body Composition  Body Fat %: 37.4 % Fat Mass (lbs): 66.4 lbs Muscle Mass (lbs): 105.6 lbs Total Body Water (lbs): 73.8 lbs Visceral Fat Rating : 7   Other Clinical Data Fasting: No Labs: no Today's Visit #: 23 Starting Date: 09/15/22    Objective:   PHYSICAL EXAM: Blood pressure 119/78, pulse 89, height 5' 4 (1.626 m), weight 177 lb (80.3 kg), SpO2 100%. Body mass index is 30.38 kg/m.  General: she is overweight, cooperative and in no acute distress. PSYCH: Has normal mood, affect and thought process.   HEENT: EOMI, sclerae are anicteric. Lungs: Normal breathing effort, no conversational dyspnea. Extremities: Moves * 4 Neurologic: A and O * 3, good insight  DIAGNOSTIC DATA REVIEWED: BMET    Component Value Date/Time   NA 139 02/24/2024 0821   K 4.4 02/24/2024 0821   CL 103 02/24/2024 0821   CO2 20 02/24/2024 0821   GLUCOSE 81 02/24/2024 0821   GLUCOSE 73 01/06/2022 1518   BUN 16 02/24/2024 0821   CREATININE 0.78 02/24/2024 0821   CALCIUM 10.0 02/24/2024 0821   GFRNONAA >60 05/17/2016 0453   GFRAA >60 05/17/2016 0453   Lab Results  Component Value Date   HGBA1C 5.2 02/24/2024   HGBA1C 5.5 02/13/2015   Lab Results  Component Value Date   INSULIN  11.7 02/24/2024   INSULIN  20.1 09/15/2022   Lab Results  Component Value Date   TSH 2.210 02/24/2024   CBC    Component Value Date/Time   WBC 7.7 09/15/2022 0939   WBC 9.5 01/06/2022 1518   RBC 4.52 09/15/2022 0939   RBC 4.61 01/06/2022 1518   HGB 12.9 09/15/2022 0939   HCT 39.2 09/15/2022 0939   PLT 334 09/15/2022 0939   MCV 87 09/15/2022 0939   MCH 28.5 09/15/2022 0939   MCH 29.6 12/12/2019 0600   MCHC 32.9 09/15/2022 0939   MCHC 33.2 01/06/2022 1518  RDW 13.2 09/15/2022 0939   Iron  Studies    Component Value Date/Time   IRON  54 06/12/2020 1045   FERRITIN 12.1 06/12/2020 1045   IRONPCTSAT 11.7 (L) 06/12/2020 1045   Lipid Panel     Component Value Date/Time   CHOL 237 (H) 05/25/2023 0857   TRIG 151 (H) 05/25/2023 0857   HDL 50 05/25/2023 0857   CHOLHDL 4.7 (H) 05/25/2023 0857   CHOLHDL 4 06/12/2020 1045   VLDL 27.8 06/12/2020 1045   LDLCALC 160 (H) 05/25/2023 0857   LDLDIRECT 150.0 02/13/2015 1449   Hepatic Function Panel     Component Value Date/Time   PROT 7.8 05/25/2023 0857   ALBUMIN 4.5 05/25/2023 0857   AST 12 05/25/2023 0857   ALT 14 05/25/2023 0857   ALKPHOS 68 05/25/2023 0857   BILITOT 0.4 05/25/2023 0857   BILIDIR 0.1 01/06/2022 1518      Component Value Date/Time   TSH 2.210 02/24/2024 0821   Nutritional Lab Results  Component Value Date   VD25OH 75.5 02/24/2024   VD25OH 56.9 05/25/2023   VD25OH 32.6 09/15/2022    Attestations:   I, Sonny Laroche, acting as a stage manager for Sheri Jenkins, DO., have compiled all relevant documentation for today's office visit on behalf of Sheri Jenkins, DO, while in the presence of [employer], DO.   I have reviewed the above documentation for accuracy and completeness, and I agree with the above. Sheri JINNY [name], D.O.  The 21st Century Cures Act was signed into law in 2016 which includes the topic of electronic health records.  This provides immediate access to information in MyChart.  This includes consultation notes, operative notes, office notes, lab results and pathology reports.  If you have any questions about what you read please let us  know at your next visit so we can discuss your concerns and take corrective action if need be.  We are right here with you.

## 2024-03-27 ENCOUNTER — Telehealth: Admitting: Family Medicine

## 2024-03-27 DIAGNOSIS — B354 Tinea corporis: Secondary | ICD-10-CM

## 2024-03-27 MED ORDER — FLUCONAZOLE 150 MG PO TABS: 150.0000 mg | ORAL_TABLET | ORAL | 0 refills | Status: DC

## 2024-03-27 MED ORDER — KETOCONAZOLE 2 % EX CREA: 1.0000 | TOPICAL_CREAM | Freq: Every day | CUTANEOUS | 0 refills | Status: DC

## 2024-03-27 NOTE — Progress Notes (Signed)
 E Visit for Rash  We are sorry that you are not feeling well. Here is how we plan to help!  Ringworm treatment  Diflucan  once weekly for two weeks  Topical- Ketoconzaole is also provided for once daily application.  HOME CARE:  Take cool showers and avoid direct sunlight. Apply cool compress or wet dressings. Take a bath in an oatmeal bath.  Sprinkle content of one Aveeno packet under running faucet with comfortably warm water.  Bathe for 15-20 minutes, 1-2 times daily.  Pat dry with a towel. Do not rub the rash. Use hydrocortisone cream. Take an antihistamine like Benadryl  for widespread rashes that itch.  The adult dose of Benadryl  is 25-50 mg by mouth 4 times daily. Caution:  This type of medication may cause sleepiness.  Do not drink alcohol, drive, or operate dangerous machinery while taking antihistamines.  Do not take these medications if you have prostate enlargement.  Read package instructions thoroughly on all medications that you take.  GET HELP RIGHT AWAY IF:  Symptoms don't go away after treatment. Severe itching that persists. If you rash spreads or swells. If you rash begins to smell. If it blisters and opens or develops a yellow-brown crust. You develop a fever. You have a sore throat. You become short of breath.  MAKE SURE YOU:  Understand these instructions. Will watch your condition. Will get help right away if you are not doing well or get worse.  Thank you for choosing an e-visit. Your e-visit answers were reviewed by a board certified advanced clinical practitioner to complete your personal care plan. Depending upon the condition, your plan could have included both over the counter or prescription medications. Please review your pharmacy choice. Be sure that the pharmacy you have chosen is open so that you can pick up your prescription now.  If there is a problem you may message your provider in MyChart to have the prescription routed to another  pharmacy. Your safety is important to us . If you have drug allergies check your prescription carefully.  For the next 24 hours, you can use MyChart to ask questions about today's visit, request a non-urgent call back, or ask for a work or school excuse from your e-visit provider. You will get an email in the next two days asking about your experience. I hope that your e-visit has been valuable and will speed your recovery.  I have spent 5 minutes in review of e-visit questionnaire, review and updating patient chart, medical decision making and response to patient.   Chiquita CHRISTELLA Barefoot, NP

## 2024-03-29 ENCOUNTER — Encounter (HOSPITAL_BASED_OUTPATIENT_CLINIC_OR_DEPARTMENT_OTHER): Payer: Self-pay | Admitting: Cardiology

## 2024-04-10 ENCOUNTER — Telehealth: Admitting: Physician Assistant

## 2024-04-10 DIAGNOSIS — A084 Viral intestinal infection, unspecified: Secondary | ICD-10-CM

## 2024-04-10 MED ORDER — ONDANSETRON 4 MG PO TBDP: 4.0000 mg | ORAL_TABLET | Freq: Three times a day (TID) | ORAL | 0 refills | Status: AC | PRN

## 2024-04-10 NOTE — Progress Notes (Unsigned)

## 2024-04-11 NOTE — Progress Notes (Unsigned)
 " [name] [address] [place] [address] Phone: [phone] Subjective:   Sheri [name], am serving as a scribe for Dr. Arthea Claudene.  I'Sheri seeing this patient by the request  of:  Panosh, [name], MD  CC: back and neck pain follow up   [name]  Sheri [name] is a 33 y.o. female coming in with complaint of back and neck pain. OMT 02/16/2024. Patient states that hips are doing well. Scapular area is painful.         Reviewed prior external information including notes and imaging from previsou exam, outside providers and external EMR if available.   As well as notes that were available from care everywhere and other healthcare systems.  Past medical history, social, surgical and family history all reviewed in electronic medical record.  No pertanent information unless stated regarding to the chief complaint.   Past Medical History:  Diagnosis Date   Alcohol addiction (HCC)    Anemia    Anxiety    Back pain    Bipolar depression (HCC)    under psych rx    Carpal tunnel syndrome    Chest pain    Depression    Drug addiction in remission [facility])    heroin   Enteritis due to Norovirus 05/18/2016   Heartburn    Hx of pulmonary embolus 02/13/2015   Hyperlipidemia    Hypokalemia 05/17/2016   IUD (intrauterine device) in place 02/13/2015   Joint pain    Mild mitral regurgitation    Mild tricuspid regurgitation    Mitral regurgitation 06/13/2018   Near syncope 05/16/2016   a. felt due to norovirus.   Nonallopathic lesion of cervical region 09/01/2018   Nonallopathic lesion of rib cage 09/01/2018   Obesity    Orthostatic hypotension 05/18/2016   Palpitations    PCOS (polycystic ovarian syndrome) 02/13/2015   Prediabetes    Pulmonary embolism (HCC) 2011   neg heme evaluation  felt to be from ocps    Recurrent upper respiratory infection (URI)    Scapular dyskinesis 09/01/2018   Smoker 12/30/2016   Syncope and  collapse 05/16/2016   Thoracic outlet syndrome of left thoracic outlet 02/15/2018   Thoracic radiculopathy due to degenerative joint disease of spine 10/13/2016   CT and XR show at least 3 levels of DDD of thoracic spine  I suspect this is related to both the scoliosis and the history of dance   Urticaria     Allergies[1]   Review of Systems:  No headache, visual changes, nausea, vomiting, diarrhea, constipation, dizziness, abdominal pain, skin rash, fevers, chills, night sweats, weight loss, swollen lymph nodes, body aches, joint swelling, chest pain, shortness of breath, mood changes. POSITIVE muscle aches  Objective  Blood pressure 110/80, pulse 94, height 5' 4 (1.626 Sheri), weight 181 lb (82.1 kg), SpO2 98%.   General: No apparent distress alert and oriented x3 mood and affect normal, dressed appropriately.  HEENT: Pupils equal, extraocular movements intact  Respiratory: Patient's speak in full sentences and does not appear short of breath  Cardiovascular: No lower extremity edema, non tender, no erythema  Gait Neck exam does have loss of lordosis.  Tightness noted in the right side of the lower neck but the left side of the parascapular area  Osteopathic findings  C2 flexed rotated and side bent right C6 flexed rotated and side bent left C7 flexed rotated and side bent right T2 extended rotated and side bent right  inhaled rib T7 extended rotated and side bent left inhaled rib  L1 flexed rotated and side bent right Sacrum right on right       Assessment and Plan:  Scapular dyskinesis Scapular dyskinesis causing more of an impingement noted the upper shoulder blade that I think is causing more of the discomfort in the neck.  Responded extremely well though to osteopathic manipulation with improvement in range of motion and strength even.  We discussed with patient the icing regimen and home exercises, increase activity slowly.  Follow-up again in 6 to 12 weeks.     Nonallopathic problems  Decision today to treat with OMT was based on Physical Exam  After verbal consent patient was treated with HVLA, ME, FPR techniques in cervical, rib, thoracic, lumbar, and sacral  areas  Patient tolerated the procedure well with improvement in symptoms  Patient given exercises, stretches and lifestyle modifications  See medications in patient instructions if given  Patient will follow up in 4-8 weeks     The above documentation has been reviewed and is accurate and complete Sheri [name] Sheri Labrina Lines, DO         Note: This dictation was prepared with Dragon dictation along with smaller phrase technology. Any transcriptional errors that result from this process are unintentional.            [1]  Allergies Allergen Reactions   Bupropion Itching and Other (See Comments)   "

## 2024-04-17 ENCOUNTER — Ambulatory Visit: Admitting: Family Medicine

## 2024-04-17 ENCOUNTER — Encounter: Payer: Self-pay | Admitting: Family Medicine

## 2024-04-17 VITALS — BP 110/80 | HR 94 | Ht 64.0 in | Wt 181.0 lb

## 2024-04-17 DIAGNOSIS — G2589 Other specified extrapyramidal and movement disorders: Secondary | ICD-10-CM | POA: Diagnosis not present

## 2024-04-17 DIAGNOSIS — M9908 Segmental and somatic dysfunction of rib cage: Secondary | ICD-10-CM | POA: Diagnosis not present

## 2024-04-17 DIAGNOSIS — M9902 Segmental and somatic dysfunction of thoracic region: Secondary | ICD-10-CM | POA: Diagnosis not present

## 2024-04-17 DIAGNOSIS — M9901 Segmental and somatic dysfunction of cervical region: Secondary | ICD-10-CM | POA: Diagnosis not present

## 2024-04-17 DIAGNOSIS — M9903 Segmental and somatic dysfunction of lumbar region: Secondary | ICD-10-CM | POA: Diagnosis not present

## 2024-04-17 DIAGNOSIS — M9904 Segmental and somatic dysfunction of sacral region: Secondary | ICD-10-CM | POA: Diagnosis not present

## 2024-04-17 NOTE — Assessment & Plan Note (Signed)
 Scapular dyskinesis causing more of an impingement noted the upper shoulder blade that I think is causing more of the discomfort in the neck.  Responded extremely well though to osteopathic manipulation with improvement in range of motion and strength even.  We discussed with patient the icing regimen and home exercises, increase activity slowly.  Follow-up again in 6 to 12 weeks.

## 2024-04-24 ENCOUNTER — Ambulatory Visit (INDEPENDENT_AMBULATORY_CARE_PROVIDER_SITE_OTHER): Admitting: Family Medicine

## 2024-04-28 ENCOUNTER — Other Ambulatory Visit: Payer: Self-pay | Admitting: Allergy

## 2024-04-28 ENCOUNTER — Encounter: Payer: Self-pay | Admitting: Allergy

## 2024-04-28 NOTE — Anesthesia Postprocedure Evaluation (Signed)
 Patient: Sheri [name]  Procedure Summary     Date: 04/28/24 Room / Location: [facility] OR 01 / [facility] OR   Anesthesia Start: 1042 Anesthesia Stop: 1157   Procedures:      HEMORRHOIDECTOMY (Anus)     BIOPSY/EXCISION SKIN (Right: Thigh) Diagnosis:      Grade IV hemorrhoids     (Grade IV hemorrhoids [K64.3])   Surgeons: Chandler Lace, MD Responsible Provider: Abran Sherwood Molt, MD   Anesthesia Type: general ASA Status: 2       Anesthesia Type: general  Vitals Value Taken Time  BP 92/75 04/28/24 12:15  Temp 97.5 F (36.4 C) 04/28/24 11:54  Pulse 88 04/28/24 12:19  Resp 19 04/28/24 12:19  SpO2 100 % 04/28/24 12:19  Vitals shown include unfiled device data.  No notable events documented.  Anesthesia Post Evaluation  Final anesthesia type: general Patient location during evaluation: PACU Patient participation: complete - patient participated Level of consciousness: awake and alert Pain score: 0 Pain management: adequate Post-op nausea and vomiting?: none Post-op vital signs: post-procedure vital signs are stable Patient temperature: Normothermic Cardiovascular status: acceptable and hemodynamically stable Respiratory status: acceptable Hydration status: acceptable Post-op disposition: Phase II Anesthesia post-op complications?:no complications

## 2024-04-28 NOTE — H&P (Addendum)
 General Surgery Office Visit   Name:  Sheri [name] MRN:  [id number] Date:  03/06/2024   Referring Physician:  No referring provider defined for this encounter.  PCP:  Apolinar Jacques [place], MD   Chief Complaint: Hemorrhoids   History of Present Illness:   Sheri [name] is a 34 y.o. female who presents as referral from her gastroenterology provider in [place] to our office today to discuss management of her worsening hemorrhoids. Patient states that she has had these hemorrhoids for some time but that over the last month has progressive pain and discomfort. The patient states that she has suppositories but has not irregularity. On examination the patient has a grade 4 hemorrhoid without signs of incarceration, strangulation or active bleeding. She states that her chief complaint is that of pain and that it is greatly affecting her quality of life desires definitive surgical management if possible. She is also concerned about recurrence of her symptoms should she decide to have any future pregnancies. No other relevant findings.    Interval history: Sheri [name] returns to clinic today for continued instructions with regards to surgical management of her hemorrhoids.  She was last seen in this office back in February of this year and was tentatively scheduled for a hemorrhoidectomy in May.  She reports today that she reconsidered surgical intervention following a sigmoidoscopy and opinion rendered by another provider.  She is here again today to discuss surgical management as her hemorrhoids have returned.  She reports that is not as severe as last time but remained uncomfortable.  She reports she has tried cream and increase fiber intake with no significant improvement.  She notes no bleeding following bowel movement but reports that bowel movements are often uncomfortable.  On examination a grade 4 mildly thrombosed hemorrhoid is noted.       Past Medical History:     [Medical History]  [Medical History] Past Medical History     Diagnosis Date   Anxiety     [Surgical History]  [Surgical History] Past Surgical History No past surgical history on file.   Past Family History:   [Family History]  [Family History]       Problem Relation Name Age of Onset   Colon cancer Maternal Grandfather       Hyperlipidemia Mother       Hyperlipidemia Father       Social History           Socioeconomic History   Marital status: Single      Spouse name: Not on file   Number of children: Not on file   Years of education: Not on file   Highest education level: Not on file  Occupational History   Not on file  Tobacco Use   Smoking status: Never   Smokeless tobacco: Never  Substance and Sexual Activity   Alcohol use: Not Currently   Drug use: Never      Comment: Drug use: Denies   Sexual activity: Not on file  Other Topics Concern   Not on file  Social History Narrative   Not on file    Social Drivers of Health        Food Insecurity: Food Insecurity Present (04/14/2023)    Received from [facility]    Food vital sign     Within the past 12 months, you worried that your food would run out before you got money to buy more: Sometimes true     Within the past  12 months, the food you bought just didn't last and you didn't have money to get more: Never true  Transportation Needs: No Transportation Needs (04/14/2023)    Received from [facility] - Transportation     Lack of Transportation (Medical): No     Lack of Transportation (Non-Medical): No  Safety: Not on file  Living Situation: Unknown (04/14/2023)    Received from [facility]    Living Situation     In the last 12 months, was there a time when you were not able to pay the mortgage or rent on time?: No     Number of Times Moved in the Last Year: Not on file     At any time in the past 12 months, were you homeless or living in a shelter (including  now)?: No        Review of systems: Pertinent positives noted in interval history above otherwise negative   Medications: No orders of the defined types were placed in this encounter.         Allergies: [Allergies]  [Allergies]     Allergen Reactions   Bupropion Itching and Other (See Comments)     Vital Signs: Review Flowsheet  More data exists        01/11/2023 06/02/2023 06/09/2023 03/06/2024  ONCBCN RECENT VITALS  Height 163 cm 163 cm 163 cm 163 cm  Weight 95.255 kg 95.255 kg 89.359 kg 89.359 kg  BSA (Calculated - sq m) 2.07 m2 2.07 m2 2.01 m2 2.01 m2  Temp 98.5 F (36.9 C) 98 F (36.7 C) 98.4 F (36.9 C) 98 F (36.7 C)  BP 145/87 108/65 114/60 111/74  Heart Rate 94 82 90 97  Respiratory Rate 18 - 19 -  Oxygen Saturation 100 % 99 % 100 % 97 %      Physical Exam Vitals reviewed. Exam conducted with a chaperone present.  Constitutional:      General: She is not in acute distress.    Appearance: Normal appearance.  HENT:     Head: Normocephalic and atraumatic.  Cardiovascular:     Rate and Rhythm: Normal rate.  Pulmonary:     Effort: Pulmonary effort is normal. No respiratory distress.  Abdominal:     General: Abdomen is flat.     Palpations: Abdomen is soft.  Genitourinary:    Comments: Grade 4 mildly thrombosed hemorrhoid present.  Digital rectal examination not performed Skin:    General: Skin is warm and dry.  Right medial thigh skin tag Neurological:     General: No focal deficit present.     Mental Status: She is alert and oriented to person, place, and time.  Psychiatric:        Mood and Affect: Mood normal.        Behavior: Behavior normal.       Laboratory Data:   Recent Results  No results found for this or any previous visit (from the past week).     Impression and Plan: 34 year old female with clinical findings consistent with symptomatic hemorrhoids.  The pathophysiology of anorectal hemorrhoids was discussed in detail as well as  the typical natural history.  The various treatment options were discussed including operative versus nonoperative treatment.  It was explained that the majority of hemorrhoids can be managed nonoperatively with best medical therapy including increased fluid intake, fiber, sitz bath's and stool softeners along with Anusol cream and that surgery is typically reserved for refractory cases.  The  patient has described in detail that nonsurgical treatment has failed to demonstrate improvement and wishes to proceed with surgical removal of the involved symptomatic hemorrhoidal tissue.  I explained further in detail that hemorrhoidectomy involves removal of 2 out of the 3 described hemorrhoidal columns out of concern for injuring the underlying sphincter muscle.  The risks and benefits of surgical hemorrhoidectomy were described in detail including bleeding, infection further symptomatic hemorrhoid disease with the remaining hemorrhoidal column, urinary retention, significant postoperative pain, as well as the cardiopulmonary risks of general anesthesia.  The patient voiced understanding and agreed to proceed.   PLAN:  OR for surgical hemorrhoidectomy and right medial skin tag excision in lithotomy position on 04/28/24 at [facility] Informed consent obtained in office today Basic labs and testing as indicated  Continue with anorectal hygiene including 28 g of fiber per day 8 glasses of water per day Frequent sits baths as needed Stool softeners   Beverley Ned, [facility] General Acute Care & Robotic Surgery  [facility]   ______________________________________________________________________________________________________________ Interval H&P  The H&P has been reviewed. The patient has been seen and examined. No acute changes noted.  OR for surgical hemorrhoidectomy and right medial skin tag excision in lithotomy position  The patient has been cleared to  undergo surgery in an ambulatory surgical setting  Beverley Ned, [facility] General Acute Care & Robotic Surgery  [facility]

## 2024-05-01 ENCOUNTER — Other Ambulatory Visit: Payer: Self-pay

## 2024-05-01 ENCOUNTER — Emergency Department (HOSPITAL_COMMUNITY)

## 2024-05-01 ENCOUNTER — Encounter (HOSPITAL_COMMUNITY): Payer: Self-pay

## 2024-05-01 ENCOUNTER — Observation Stay (HOSPITAL_COMMUNITY)

## 2024-05-01 ENCOUNTER — Observation Stay (HOSPITAL_COMMUNITY)
Admission: EM | Admit: 2024-05-01 | Discharge: 2024-05-02 | Disposition: A | Attending: Emergency Medicine | Admitting: Emergency Medicine

## 2024-05-01 DIAGNOSIS — Z7982 Long term (current) use of aspirin: Secondary | ICD-10-CM | POA: Insufficient documentation

## 2024-05-01 DIAGNOSIS — F419 Anxiety disorder, unspecified: Secondary | ICD-10-CM

## 2024-05-01 DIAGNOSIS — F39 Unspecified mood [affective] disorder: Secondary | ICD-10-CM

## 2024-05-01 DIAGNOSIS — Z87891 Personal history of nicotine dependence: Secondary | ICD-10-CM | POA: Diagnosis not present

## 2024-05-01 DIAGNOSIS — K649 Unspecified hemorrhoids: Secondary | ICD-10-CM | POA: Insufficient documentation

## 2024-05-01 DIAGNOSIS — Z8742 Personal history of other diseases of the female genital tract: Secondary | ICD-10-CM | POA: Diagnosis not present

## 2024-05-01 DIAGNOSIS — E66811 Obesity, class 1: Secondary | ICD-10-CM | POA: Insufficient documentation

## 2024-05-01 DIAGNOSIS — R7401 Elevation of levels of liver transaminase levels: Secondary | ICD-10-CM | POA: Insufficient documentation

## 2024-05-01 DIAGNOSIS — K851 Biliary acute pancreatitis without necrosis or infection: Principal | ICD-10-CM

## 2024-05-01 DIAGNOSIS — R748 Abnormal levels of other serum enzymes: Secondary | ICD-10-CM | POA: Diagnosis not present

## 2024-05-01 DIAGNOSIS — R109 Unspecified abdominal pain: Secondary | ICD-10-CM | POA: Diagnosis present

## 2024-05-01 DIAGNOSIS — Z6830 Body mass index (BMI) 30.0-30.9, adult: Secondary | ICD-10-CM | POA: Insufficient documentation

## 2024-05-01 LAB — COMPREHENSIVE METABOLIC PANEL WITH GFR
ALT: 89 U/L — ABNORMAL HIGH (ref 0–44)
AST: 157 U/L — ABNORMAL HIGH (ref 15–41)
Albumin: 4.1 g/dL (ref 3.5–5.0)
Alkaline Phosphatase: 70 U/L (ref 38–126)
Anion gap: 9 (ref 5–15)
BUN: 11 mg/dL (ref 6–20)
CO2: 26 mmol/L (ref 22–32)
Calcium: 9.3 mg/dL (ref 8.9–10.3)
Chloride: 102 mmol/L (ref 98–111)
Creatinine, Ser: 0.78 mg/dL (ref 0.44–1.00)
GFR, Estimated: >60 mL/min
Glucose, Bld: 112 mg/dL — ABNORMAL HIGH (ref 70–99)
Potassium: 3.7 mmol/L (ref 3.5–5.1)
Sodium: 137 mmol/L (ref 135–145)
Total Bilirubin: 0.3 mg/dL (ref 0.0–1.2)
Total Protein: 7.6 g/dL (ref 6.5–8.1)

## 2024-05-01 LAB — URINALYSIS, ROUTINE W REFLEX MICROSCOPIC
Glucose, UA: NEGATIVE mg/dL
Hgb urine dipstick: NEGATIVE
Ketones, ur: NEGATIVE mg/dL
Leukocytes,Ua: NEGATIVE
Nitrite: NEGATIVE
Protein, ur: 30 mg/dL — AB
Specific Gravity, Urine: 1.044 — ABNORMAL HIGH (ref 1.005–1.030)
pH: 5 (ref 5.0–8.0)

## 2024-05-01 LAB — CBC
HCT: 40 % (ref 36.0–46.0)
HCT: 38.5 % (ref 36.0–46.0)
Hemoglobin: 13.1 g/dL (ref 12.0–15.0)
Hemoglobin: 12.1 g/dL (ref 12.0–15.0)
MCH: 28.9 pg (ref 26.0–34.0)
MCH: 27.8 pg (ref 26.0–34.0)
MCHC: 32.8 g/dL (ref 30.0–36.0)
MCHC: 31.4 g/dL (ref 30.0–36.0)
MCV: 88.3 fL (ref 80.0–100.0)
MCV: 88.5 fL (ref 80.0–100.0)
Platelets: 372 K/uL (ref 150–400)
Platelets: 368 K/uL (ref 150–400)
RBC: 4.53 MIL/uL (ref 3.87–5.11)
RBC: 4.35 MIL/uL (ref 3.87–5.11)
RDW: 13.5 % (ref 11.5–15.5)
RDW: 13.4 % (ref 11.5–15.5)
WBC: 12.5 K/uL — ABNORMAL HIGH (ref 4.0–10.5)
WBC: 10.1 K/uL (ref 4.0–10.5)
nRBC: 0 % (ref 0.0–0.2)
nRBC: 0 % (ref 0.0–0.2)

## 2024-05-01 LAB — LIPASE, BLOOD: Lipase: 130 U/L — ABNORMAL HIGH (ref 11–51)

## 2024-05-01 LAB — HCG, QUANTITATIVE, PREGNANCY: hCG, Beta Chain, Quant, S: <1 m[IU]/mL

## 2024-05-01 LAB — CREATININE, SERUM
Creatinine, Ser: 0.76 mg/dL (ref 0.44–1.00)
GFR, Estimated: >60 mL/min

## 2024-05-01 LAB — HIV ANTIBODY (ROUTINE TESTING W REFLEX): HIV Screen 4th Generation wRfx: NONREACTIVE

## 2024-05-01 MED ORDER — ALBUTEROL-BUDESONIDE 90-80 MCG/ACT IN AERO: 2.0000 | INHALATION_SPRAY | RESPIRATORY_TRACT | Status: DC | PRN

## 2024-05-01 MED ORDER — MORPHINE SULFATE (PF) 2 MG/ML IV SOLN
1.0000 mg | INTRAVENOUS | Status: DC | PRN
  Administered 2024-05-01 – 2024-05-02 (×3): 1 mg via INTRAVENOUS
  Filled 2024-05-01 (×3): qty 1

## 2024-05-01 MED ORDER — LACTATED RINGERS IV SOLN: INTRAVENOUS | Status: DC

## 2024-05-01 MED ORDER — GADOBUTROL 1 MMOL/ML IV SOLN
8.0000 mL | Freq: Once | INTRAVENOUS | Status: AC | PRN
  Administered 2024-05-01: 8 mL via INTRAVENOUS

## 2024-05-01 MED ORDER — BUDESONIDE 0.25 MG/2ML IN SUSP: 0.2500 mg | RESPIRATORY_TRACT | Status: DC | PRN

## 2024-05-01 MED ORDER — LAMOTRIGINE 100 MG PO TABS
100.0000 mg | ORAL_TABLET | Freq: Every day | ORAL | Status: DC
  Administered 2024-05-02: 100 mg via ORAL
  Filled 2024-05-01: qty 1

## 2024-05-01 MED ORDER — ONDANSETRON HCL 4 MG PO TABS: 4.0000 mg | ORAL_TABLET | Freq: Four times a day (QID) | ORAL | Status: DC | PRN

## 2024-05-01 MED ORDER — ACETAMINOPHEN 325 MG PO TABS
650.0000 mg | ORAL_TABLET | Freq: Four times a day (QID) | ORAL | Status: DC | PRN
  Administered 2024-05-02 (×2): 650 mg via ORAL
  Filled 2024-05-01 (×2): qty 2

## 2024-05-01 MED ORDER — POLYETHYLENE GLYCOL 3350 17 G PO PACK
17.0000 g | PACK | Freq: Every day | ORAL | Status: DC
  Administered 2024-05-01 – 2024-05-02 (×2): 17 g via ORAL
  Filled 2024-05-01 (×2): qty 1

## 2024-05-01 MED ORDER — ONDANSETRON HCL 4 MG/2ML IJ SOLN
4.0000 mg | Freq: Once | INTRAMUSCULAR | Status: AC
  Administered 2024-05-01: 4 mg via INTRAVENOUS
  Filled 2024-05-01: qty 2

## 2024-05-01 MED ORDER — DOCUSATE SODIUM 100 MG PO CAPS
100.0000 mg | ORAL_CAPSULE | Freq: Two times a day (BID) | ORAL | Status: DC
  Administered 2024-05-01 – 2024-05-02 (×2): 100 mg via ORAL
  Filled 2024-05-01 (×4): qty 1

## 2024-05-01 MED ORDER — ALBUTEROL SULFATE (2.5 MG/3ML) 0.083% IN NEBU: 2.5000 mg | INHALATION_SOLUTION | RESPIRATORY_TRACT | Status: DC | PRN

## 2024-05-01 MED ORDER — ONDANSETRON HCL 4 MG/2ML IJ SOLN
4.0000 mg | Freq: Four times a day (QID) | INTRAMUSCULAR | Status: DC | PRN
  Administered 2024-05-01: 4 mg via INTRAVENOUS
  Filled 2024-05-01: qty 2

## 2024-05-01 MED ORDER — KETOROLAC TROMETHAMINE 15 MG/ML IJ SOLN
15.0000 mg | Freq: Four times a day (QID) | INTRAMUSCULAR | Status: DC | PRN
  Administered 2024-05-01 – 2024-05-02 (×3): 15 mg via INTRAVENOUS
  Filled 2024-05-01 (×3): qty 1

## 2024-05-01 MED ORDER — SODIUM CHLORIDE 0.9 % IV SOLN
2.0000 g | Freq: Once | INTRAVENOUS | Status: AC
  Administered 2024-05-01: 2 g via INTRAVENOUS
  Filled 2024-05-01: qty 20

## 2024-05-01 MED ORDER — SERTRALINE HCL 50 MG PO TABS
75.0000 mg | ORAL_TABLET | Freq: Every day | ORAL | Status: DC
  Administered 2024-05-02: 75 mg via ORAL
  Filled 2024-05-01: qty 1

## 2024-05-01 MED ORDER — ENOXAPARIN SODIUM 40 MG/0.4ML IJ SOSY
40.0000 mg | PREFILLED_SYRINGE | Freq: Every day | INTRAMUSCULAR | Status: DC
  Administered 2024-05-01: 40 mg via SUBCUTANEOUS
  Filled 2024-05-01: qty 0.4

## 2024-05-01 MED ORDER — MORPHINE SULFATE (PF) 4 MG/ML IV SOLN
4.0000 mg | Freq: Once | INTRAVENOUS | Status: AC
  Administered 2024-05-01: 4 mg via INTRAVENOUS
  Filled 2024-05-01: qty 1

## 2024-05-01 MED ORDER — IBUPROFEN 800 MG PO TABS
800.0000 mg | ORAL_TABLET | Freq: Four times a day (QID) | ORAL | Status: DC | PRN
  Filled 2024-05-01: qty 1

## 2024-05-01 MED ORDER — ACETAMINOPHEN 650 MG RE SUPP: 650.0000 mg | Freq: Four times a day (QID) | RECTAL | Status: DC | PRN

## 2024-05-01 MED ORDER — METHOCARBAMOL 500 MG PO TABS
750.0000 mg | ORAL_TABLET | Freq: Four times a day (QID) | ORAL | Status: DC | PRN
  Administered 2024-05-01 – 2024-05-02 (×2): 750 mg via ORAL
  Filled 2024-05-01 (×2): qty 2

## 2024-05-01 MED ORDER — FLUTICASONE FUROATE-VILANTEROL 100-25 MCG/ACT IN AEPB
1.0000 | INHALATION_SPRAY | Freq: Every day | RESPIRATORY_TRACT | Status: DC
  Administered 2024-05-02: 1 via RESPIRATORY_TRACT
  Filled 2024-05-01: qty 28

## 2024-05-01 MED ORDER — BUSPIRONE HCL 5 MG PO TABS
15.0000 mg | ORAL_TABLET | Freq: Two times a day (BID) | ORAL | Status: DC
  Administered 2024-05-01 – 2024-05-02 (×2): 15 mg via ORAL
  Filled 2024-05-01 (×2): qty 1

## 2024-05-01 MED ORDER — LORATADINE 10 MG PO TABS
10.0000 mg | ORAL_TABLET | Freq: Every day | ORAL | Status: DC
  Administered 2024-05-02: 10 mg via ORAL
  Filled 2024-05-01: qty 1

## 2024-05-01 NOTE — ED Notes (Signed)
 Patient transported to MRI

## 2024-05-01 NOTE — Consult Note (Addendum)
 [facility] Gastroenterology Consult  Referring Provider: No ref. provider found Primary Care Physician:  Charlett [name], MD Primary Gastroenterologist: Dr. Burnette  Reason for Consultation: Abdominal pain and elevated lipase  HPI: Sheri [name] is a 34 y.o. female who was in his usual state of health until 4 AM today when she woke up with upper abdominal pain which radiated to the back and is associated with nausea. Since the pain was severe she came to the ER.  Patient has been sober for the last 10 years, otherwise has history of significant alcohol use and history of prescription drug abuse in the past. Patient has been on Zepbound  for weight loss since summer 2025 and has lost about 40 to 50 pounds.  No prior history of pancreatitis. There is no family history of pancreatitis or pancreatic cancer. Patient denies recent issues with acid reflux, heartburn, difficulty swallowing, pain on swallowing.  She recently underwent hemorrhoidectomy, excision of right thigh lesion, bilateral pudendal nerve block for grade 4 hemorrhoids at [facility] on 04/28/2024.  Previous GI workup: Flexible sigmoidoscopy, 06/2023, Dr. Burnette, rectal bleeding, rectal pain: External and internal hemorrhoids EGD, Dr. Lupita Commander, left upper quadrant abdominal pain: Unremarkable, no specimens collected  Past Medical History:  Diagnosis Date   Alcohol addiction (HCC)    Anemia    Anxiety    Back pain    Bipolar depression (HCC)    under psych rx    Carpal tunnel syndrome    Chest pain    Depression    Drug addiction in remission [facility])    heroin   Enteritis due to Norovirus 05/18/2016   Heartburn    Hx of pulmonary embolus 02/13/2015   Hyperlipidemia    Hypokalemia 05/17/2016   IUD (intrauterine device) in place 02/13/2015   Joint pain    Mild mitral regurgitation    Mild tricuspid regurgitation    Mitral regurgitation 06/13/2018   Near syncope  05/16/2016   a. felt due to norovirus.   Nonallopathic lesion of cervical region 09/01/2018   Nonallopathic lesion of rib cage 09/01/2018   Obesity    Orthostatic hypotension 05/18/2016   Palpitations    PCOS (polycystic ovarian syndrome) 02/13/2015   Prediabetes    Pulmonary embolism (HCC) 2011   neg heme evaluation  felt to be from ocps    Recurrent upper respiratory infection (URI)    Scapular dyskinesis 09/01/2018   Smoker 12/30/2016   Syncope and collapse 05/16/2016   Thoracic outlet syndrome of left thoracic outlet 02/15/2018   Thoracic radiculopathy due to degenerative joint disease of spine 10/13/2016   CT and XR show at least 3 levels of DDD of thoracic spine  I suspect this is related to both the scoliosis and the history of dance   Urticaria     Past Surgical History:  Procedure Laterality Date   NO PAST SURGERIES      Prior to Admission medications  Medication Sig Start Date End Date Taking? Authorizing Provider  albuterol  (PROAIR  HFA) 108 (90 Base) MCG/ACT inhaler Inhale 2 puffs into the lungs every 6 (six) hours as needed for wheezing or shortness of breath. 04/30/22   Mercer Clotilda SAUNDERS, MD  Albuterol -Budesonide  (AIRSUPRA ) 90-80 MCG/ACT AERO INHALE 2 PUFFS INTO THE LUNGS EVERY 4 (FOUR) HOURS AS NEEDED (COUGHING, WHEEZING, CHEST TIGHTNESS). DO NOT EXCEED 12 PUFFS IN 24 HOURS. 05/01/24   Cari Arlean HERO, FNP  aspirin EC 81 MG tablet Take 81 mg by  mouth daily. Swallow whole.    [provider]  BREO ELLIPTA  100-25 MCG/ACT AEPB INHALE 1 PUFF INTO THE LUNGS DAILY. RINSE MOUTH AFTER USE 02/17/24   Luke Orlan HERO, DO  busPIRone  (BUSPAR ) 15 MG tablet Take 1 tablet (15 mg total) by mouth 2 (two) times daily. 02/07/24   Arfeen, Leni DASEN, MD  cetirizine (ZYRTEC) 10 MG tablet Take 10 mg by mouth daily.    [provider]  cyanocobalamin  (VITAMIN B12) 500 MCG tablet Take 1 tablet (500 mcg total) by mouth daily. 01/26/24   Opalski, Barnie, DO  fluconazole  (DIFLUCAN ) 150 MG  tablet Take 1 tablet (150 mg total) by mouth once a week. For two weeks 03/27/24   Moishe Chiquita HERO, NP  ketoconazole  (NIZORAL ) 2 % cream Apply 1 Application topically daily. 03/27/24   Moishe Chiquita HERO, NP  lamoTRIgine  (LAMICTAL ) 100 MG tablet Take 1 tablet (100 mg total) by mouth daily. 02/07/24   Arfeen, Leni DASEN, MD  meloxicam  (MOBIC ) 15 MG tablet TAKE 1 TABLET (15 MG TOTAL) BY MOUTH DAILY. 05/11/23   Smith, Zachary M, DO  metFORMIN  (GLUCOPHAGE ) 500 MG tablet 2 po BID 03/23/24   Opalski, Barnie, DO  ondansetron  (ZOFRAN -ODT) 4 MG disintegrating tablet Take 1 tablet (4 mg total) by mouth every 8 (eight) hours as needed for nausea or vomiting. 04/10/24   Gladis Elsie BROCKS, PA-C  paragard intrauterine copper IUD IUD 1 each by Intrauterine route once. 01/01/15   [provider]  sertraline  (ZOLOFT ) 50 MG tablet Take 1.5 tablets (75 mg total) by mouth daily. 02/07/24   Arfeen, Leni DASEN, MD  tirzepatide  10 MG/0.5ML injection vial Inject 10 mg into the skin once a week. 03/23/24   Opalski, Deborah, DO  Vitamin D , Ergocalciferol , (DRISDOL ) 1.25 MG (50000 UNIT) CAPS capsule Take 1 capsule (50,000 Units total) by mouth every 7 (seven) days. 03/23/24   Midge Barnie, DO    No current facility-administered medications for this encounter.   Current Outpatient Medications  Medication Sig Dispense Refill   albuterol  (PROAIR  HFA) 108 (90 Base) MCG/ACT inhaler Inhale 2 puffs into the lungs every 6 (six) hours as needed for wheezing or shortness of breath. 1 each 1   Albuterol -Budesonide  (AIRSUPRA ) 90-80 MCG/ACT AERO INHALE 2 PUFFS INTO THE LUNGS EVERY 4 (FOUR) HOURS AS NEEDED (COUGHING, WHEEZING, CHEST TIGHTNESS). DO NOT EXCEED 12 PUFFS IN 24 HOURS. 10.7 g 0   aspirin EC 81 MG tablet Take 81 mg by mouth daily. Swallow whole.     BREO ELLIPTA  100-25 MCG/ACT AEPB INHALE 1 PUFF INTO THE LUNGS DAILY. RINSE MOUTH AFTER USE 60 each 5   busPIRone  (BUSPAR ) 15 MG tablet Take 1 tablet (15 mg total) by mouth 2 (two)  times daily. 180 tablet 0   cetirizine (ZYRTEC) 10 MG tablet Take 10 mg by mouth daily.     cyanocobalamin  (VITAMIN B12) 500 MCG tablet Take 1 tablet (500 mcg total) by mouth daily.     fluconazole  (DIFLUCAN ) 150 MG tablet Take 1 tablet (150 mg total) by mouth once a week. For two weeks 2 tablet 0   ketoconazole  (NIZORAL ) 2 % cream Apply 1 Application topically daily. 15 g 0   lamoTRIgine  (LAMICTAL ) 100 MG tablet Take 1 tablet (100 mg total) by mouth daily. 90 tablet 0   meloxicam  (MOBIC ) 15 MG tablet TAKE 1 TABLET (15 MG TOTAL) BY MOUTH DAILY. 90 tablet 0   metFORMIN  (GLUCOPHAGE ) 500 MG tablet 2 po BID 120 tablet 1   ondansetron  (ZOFRAN -ODT)  4 MG disintegrating tablet Take 1 tablet (4 mg total) by mouth every 8 (eight) hours as needed for nausea or vomiting. 20 tablet 0   paragard intrauterine copper IUD IUD 1 each by Intrauterine route once.     sertraline  (ZOLOFT ) 50 MG tablet Take 1.5 tablets (75 mg total) by mouth daily. 135 tablet 0   tirzepatide  10 MG/0.5ML injection vial Inject 10 mg into the skin once a week. 2 mL 1   Vitamin D , Ergocalciferol , (DRISDOL ) 1.25 MG (50000 UNIT) CAPS capsule Take 1 capsule (50,000 Units total) by mouth every 7 (seven) days. 4 capsule 1    Allergies as of 05/01/2024 - Review Complete 05/01/2024  Allergen Reaction Noted   Bupropion Itching and Other (See Comments) 02/13/2015    Family History  Problem Relation Age of Onset   Asthma Mother    Hyperlipidemia Mother    Mental illness Mother    Depression Mother    Anxiety disorder Mother    Bipolar disorder Mother    Anxiety disorder Father    Depression Father    Hypertension Father    Irritable bowel syndrome Father    Sleep apnea Father    Obesity Father    Allergic rhinitis Sister    Colon cancer Maternal Grandfather    Eczema Daughter     Social History   Socioeconomic History   Marital status: Married    Spouse name: Not on file   Number of children: Not on file   Years of  education: Not on file   Highest education level: Bachelor's degree (e.g., BA, AB, BS)  Occupational History   Occupation: nanny   Occupation: Producer, Television/film/video in Marketing & Outreach  Tobacco Use   Smoking status: Former    Current packs/day: 0.00    Types: Cigarettes    Quit date: 12/20/2015    Years since quitting: 8.3   Smokeless tobacco: Never  Vaping Use   Vaping status: Never Used  Substance and Sexual Activity   Alcohol use: No    Alcohol/week: 0.0 standard drinks of alcohol   Drug use: No   Sexual activity: Not on file    Comment: previous IUD  Other Topics Concern   Not on file  Social History Narrative   5-10 hours of sleep per night   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)   Recovering from drug and alcohol addiction   Joined [employer]   Lives with her parents   2 dogs in the home      [place] 3 years  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       Right Handed    Lives in a one story home    Social Drivers of Health   Tobacco Use: Medium Risk (05/01/2024)   Patient History    Smoking Tobacco Use: Former    Smokeless Tobacco Use: Never    Passive Exposure: Not on file  Financial Resource Strain: Medium Risk (04/14/2023)   Overall Financial Resource Strain (CARDIA)    Difficulty of Paying Living Expenses: Somewhat hard  Food Insecurity: Food Insecurity Present (04/14/2023)   Hunger Vital Sign    Worried About Running Out of Food in the Last Year: Sometimes true    Ran Out of Food in the Last Year: Never true  Transportation Needs: No Transportation Needs (04/14/2023)   PRAPARE - Administrator, Civil Service (Medical): No    Lack of Transportation (Non-Medical): No  Physical Activity: Insufficiently Active (04/14/2023)  Exercise Vital Sign    Days of Exercise per Week: 1 day    Minutes of Exercise per Session: 30 min  Stress: No Stress Concern Present (04/14/2023)   [employer] of Occupational Health - Occupational Stress Questionnaire     Feeling of Stress : Only a little  Social Connections: Unknown (04/14/2023)   Social Connection and Isolation Panel    Frequency of Communication with Friends and Family: More than three times a week    Frequency of Social Gatherings with Friends and Family: Three times a week    Attends Religious Services: Never    Active Member of Clubs or Organizations: Patient declined    Attends Banker Meetings: Not on file    Marital Status: Married  Intimate Partner Violence: Not on file  Depression (PHQ2-9): Low Risk (04/15/2023)   Depression (PHQ2-9)    PHQ-2 Score: 0  Alcohol Screen: Not on file  Housing: Unknown (04/14/2023)   Housing Stability Vital Sign    Unable to Pay for Housing in the Last Year: No    Number of Times Moved in the Last Year: Not on file    Homeless in the Last Year: No  Utilities: Not on file  Health Literacy: Not on file    Review of Systems: As per HPI Physical Exam: Vital signs in last 24 hours: Temp:  [97.8 F (36.6 C)-97.9 F (36.6 C)] 97.8 F (36.6 C) (01/12 1140) Pulse Rate:  [70-84] 70 (01/12 1030) Resp:  [12-18] 18 (01/12 1030) BP: (98-114)/(70-76) 114/74 (01/12 1030) SpO2:  [100 %] 100 % (01/12 1030)    General:   Alert,  Well-developed, well-nourished, pleasant and cooperative in NAD Head:  Normocephalic and atraumatic. Eyes:  Sclera clear, no icterus.   Conjunctiva pink. Ears:  Normal auditory acuity. Nose:  No deformity, discharge,  or lesions. Mouth:  No deformity or lesions.  Oropharynx pink & moist. Neck:  Supple; no masses or thyromegaly. Lungs:  Clear throughout to auscultation.   No wheezes, crackles, or rhonchi. No acute distress. Heart:  Regular rate and rhythm; no murmurs, clicks, rubs,  or gallops. Extremities:  Without clubbing or edema. Neurologic:  Alert and  oriented x4;  grossly normal neurologically. Skin:  Intact without significant lesions or rashes. Psych:  Alert and cooperative. Normal mood and  affect. Abdomen:  Soft, nontender and nondistended. No masses, hepatosplenomegaly or hernias noted. Normal bowel sounds, without guarding, and without rebound.         Lab Results: Recent Labs    05/01/24 0740  WBC 12.5*  HGB 13.1  HCT 40.0  PLT 372   BMET Recent Labs    05/01/24 0740  NA 137  K 3.7  CL 102  CO2 26  GLUCOSE 112*  BUN 11  CREATININE 0.78  CALCIUM 9.3   LFT Recent Labs    05/01/24 0740  PROT 7.6  ALBUMIN 4.1  AST 157*  ALT 89*  ALKPHOS 70  BILITOT 0.3   PT/INR No results for input(s): LABPROT, INR in the last 72 hours.  Studies/Results: US  Abdomen Limited RUQ (LIVER/GB) Result Date: 05/01/2024 EXAM: Right Upper Quadrant Abdominal Ultrasound 05/01/2024 09:39:09 AM TECHNIQUE: Real-time ultrasonography of the right upper quadrant of the abdomen was performed. COMPARISON: US  Abdomen 01/18/2020. CLINICAL HISTORY: Cholecystitis. FINDINGS: LIVER: Slightly increased liver echotexture, unchanged. No intrahepatic biliary ductal dilatation. No evidence of mass. Hepatopetal flow in the portal vein. BILIARY SYSTEM: Gallbladder wall thickness measures 2.0 mm. No pericholecystic fluid. No cholelithiasis. The common  bile duct measures 6.8 mm. OTHER: No right upper quadrant ascites. IMPRESSION: 1. No cholecystic liver changes or changes of acute cholecystitis. 2. The common bile duct is at the upper limits of normal for size, measuring 7 mm. Correlation with serum biliary recommended. If elevated, a follow-up multiphase abdominal MRI with iv contrast should be considered for further characterization. 3. Findings suggestive of hepatic steatosis. Electronically signed by: Rogelia Myers MD MD 05/01/2024 10:14 AM EST RP Workstation: [id number]    Impression: Abdominal pain with mild elevation lipase suspicious for pancreatitis  Ultrasound: CBD 6.8 mm, hepatic steatosis  MRCP : Mild hepatomegaly, no intrahepatic or extrahepatic bile duct dilatation, no  choledocholithiasis obstructing mass, normal-appearing pancreas.  Triglycerides from 2/25 were made mildly elevated at 151.  Lab abnormalities: Lipase 130 AST 157 ALT 89 WBC 12.5  Plan: Discussed with patient that Zepbound /tirzepatide  has adverse effects of pancreatitis/cholelithiasis/cholecystitis/elevated lipase.  Recommend clear liquid diet, IV fluids and pain management.   LOS: 0 days   Estelita Manas, MD  05/01/2024, 12:30 PM

## 2024-05-01 NOTE — H&P (Addendum)
 " History and Physical    Patient: Sheri [name] FMW:[id number] DOB: 1990/06/13 DOA: 05/01/2024 DOS: the patient was seen and examined on 05/01/2024 PCP: Charlett [name], MD  Patient coming from: Home  Chief Complaint:  Chief Complaint  Patient presents with   Abdominal Pain   HPI: Sheri [name] is a 34 y.o. female with medical history significant of PCOS, IBS, prior history of pulmonary embolism due to contraceptive pills, not on any anticoagulation at this time, history of recent hemorrhoidectomy on Friday came to ED with complaint of upper abdominal pain, mostly all across.  Having some nausea and vomiting.  Per patient she was experiencing upper abdominal pain for the past couple of month, mostly after eating some greasy food, self-limiting, this morning she woke up with worsened upper abdominal pain with associated nausea and vomiting.  Patient has an history of IBS, normally gets diarrhea frequently but no current diarrhea.  No urinary symptoms.  No upper respiratory symptoms.  No other recent illnesses or sick contacts.  No fever or chills.  Poor appetite secondary to nausea.  Patient denies any alcohol use.  ED course and data reviewed.  On presentation vital normal, labs with mild leukocytosis at 12.5, AST 157, ALT 89, lipase elevated at 130, UA with mild protein urea, bilirubin and rare bacteria.  RUQ ultrasound with no concern of acute cholecystitis, did show dilated CBD measuring 7 mm with recommendation to follow-up with MRCP which was ordered.  EDP discussed the case with GI who was recommending admission with [facility] and they will follow along.  Addendum.  MRCP was completely normal, no obstruction no CBD dilatation or choledocholithiasis. Improving symptoms-starting on clear liquid diet, if continue to improve likely can go home tomorrow.    Review of Systems: As mentioned in the history of present illness. All other systems reviewed and are negative. Past Medical  History:  Diagnosis Date   Alcohol addiction (HCC)    Anemia    Anxiety    Back pain    Bipolar depression (HCC)    under psych rx    Carpal tunnel syndrome    Chest pain    Depression    Drug addiction in remission [facility])    heroin   Enteritis due to Norovirus 05/18/2016   Heartburn    Hx of pulmonary embolus 02/13/2015   Hyperlipidemia    Hypokalemia 05/17/2016   IUD (intrauterine device) in place 02/13/2015   Joint pain    Mild mitral regurgitation    Mild tricuspid regurgitation    Mitral regurgitation 06/13/2018   Near syncope 05/16/2016   a. felt due to norovirus.   Nonallopathic lesion of cervical region 09/01/2018   Nonallopathic lesion of rib cage 09/01/2018   Obesity    Orthostatic hypotension 05/18/2016   Palpitations    PCOS (polycystic ovarian syndrome) 02/13/2015   Prediabetes    Pulmonary embolism (HCC) 2011   neg heme evaluation  felt to be from ocps    Recurrent upper respiratory infection (URI)    Scapular dyskinesis 09/01/2018   Smoker 12/30/2016   Syncope and collapse 05/16/2016   Thoracic outlet syndrome of left thoracic outlet 02/15/2018   Thoracic radiculopathy due to degenerative joint disease of spine 10/13/2016   CT and XR show at least 3 levels of DDD of thoracic spine  I suspect this is related to both the scoliosis and the history of dance   Urticaria    Past Surgical History:  Procedure Laterality Date  NO PAST SURGERIES     Social History:  reports that she quit smoking about 8 years ago. Her smoking use included cigarettes. She has never used smokeless tobacco. She reports that she does not drink alcohol and does not use drugs.  Allergies[1]  Family History  Problem Relation Age of Onset   Asthma Mother    Hyperlipidemia Mother    Mental illness Mother    Depression Mother    Anxiety disorder Mother    Bipolar disorder Mother    Anxiety disorder Father    Depression Father    Hypertension Father    Irritable bowel syndrome  Father    Sleep apnea Father    Obesity Father    Allergic rhinitis Sister    Colon cancer Maternal Grandfather    Eczema Daughter     Prior to Admission medications  Medication Sig Start Date End Date Taking? Authorizing Provider  albuterol  (PROAIR  HFA) 108 (90 Base) MCG/ACT inhaler Inhale 2 puffs into the lungs every 6 (six) hours as needed for wheezing or shortness of breath. 04/30/22   Mercer Clotilda SAUNDERS, MD  Albuterol -Budesonide  (AIRSUPRA ) 90-80 MCG/ACT AERO INHALE 2 PUFFS INTO THE LUNGS EVERY 4 (FOUR) HOURS AS NEEDED (COUGHING, WHEEZING, CHEST TIGHTNESS). DO NOT EXCEED 12 PUFFS IN 24 HOURS. 05/01/24   Cari Arlean HERO, FNP  aspirin EC 81 MG tablet Take 81 mg by mouth daily. Swallow whole.    [provider]  BREO ELLIPTA  100-25 MCG/ACT AEPB INHALE 1 PUFF INTO THE LUNGS DAILY. RINSE MOUTH AFTER USE 02/17/24   Luke Orlan HERO, DO  busPIRone  (BUSPAR ) 15 MG tablet Take 1 tablet (15 mg total) by mouth 2 (two) times daily. 02/07/24   Arfeen, Leni DASEN, MD  cetirizine (ZYRTEC) 10 MG tablet Take 10 mg by mouth daily.    [provider]  cyanocobalamin  (VITAMIN B12) 500 MCG tablet Take 1 tablet (500 mcg total) by mouth daily. 01/26/24   Opalski, Barnie, DO  fluconazole  (DIFLUCAN ) 150 MG tablet Take 1 tablet (150 mg total) by mouth once a week. For two weeks 03/27/24   Moishe Chiquita HERO, NP  ketoconazole  (NIZORAL ) 2 % cream Apply 1 Application topically daily. 03/27/24   Moishe Chiquita HERO, NP  lamoTRIgine  (LAMICTAL ) 100 MG tablet Take 1 tablet (100 mg total) by mouth daily. 02/07/24   Arfeen, Leni DASEN, MD  meloxicam  (MOBIC ) 15 MG tablet TAKE 1 TABLET (15 MG TOTAL) BY MOUTH DAILY. 05/11/23   Smith, Zachary M, DO  metFORMIN  (GLUCOPHAGE ) 500 MG tablet 2 po BID 03/23/24   Opalski, Barnie, DO  ondansetron  (ZOFRAN -ODT) 4 MG disintegrating tablet Take 1 tablet (4 mg total) by mouth every 8 (eight) hours as needed for nausea or vomiting. 04/10/24   Gladis Elsie BROCKS, PA-C  paragard intrauterine copper IUD IUD 1  each by Intrauterine route once. 01/01/15   [provider]  sertraline  (ZOLOFT ) 50 MG tablet Take 1.5 tablets (75 mg total) by mouth daily. 02/07/24   Arfeen, Leni DASEN, MD  tirzepatide  10 MG/0.5ML injection vial Inject 10 mg into the skin once a week. 03/23/24   Opalski, Deborah, DO  Vitamin D , Ergocalciferol , (DRISDOL ) 1.25 MG (50000 UNIT) CAPS capsule Take 1 capsule (50,000 Units total) by mouth every 7 (seven) days. 03/23/24   Midge Barnie, DO    Physical Exam: Vitals:   05/01/24 0915 05/01/24 1030 05/01/24 1139 05/01/24 1140  BP: 114/70 114/74    Pulse: 74 70    Resp: 18 18    Temp:  97.9 F (36.6 C) 97.8 F (36.6 C)  TempSrc:   Oral Oral  SpO2: 100% 100%      General: Vital signs reviewed.  Patient is well-developed and well-nourished, in no acute distress and cooperative with exam.  Head: Normocephalic and atraumatic. Eyes: EOMI, conjunctivae normal, no scleral icterus.  Neck: Supple, trachea midline, normal ROM, no JVD,  Cardiovascular: RRR, S1 normal, S2 normal, no murmurs, gallops, or rubs. Pulmonary/Chest: Clear to auscultation bilaterally, no wheezes, rales, or rhonchi. Abdominal: Soft, non-tender, non-distended, BS +, negative Murphy sign. Extremities: No lower extremity edema bilaterally,  pulses symmetric and intact bilaterally. No cyanosis or clubbing. Neurological: A&O x3, Strength is normal and symmetric bilaterally, cranial nerve II-XII are grossly intact, no focal motor deficit, sensory intact to light touch bilaterally.  Skin: Warm, dry and intact. No rashes or erythema. Psychiatric: Normal mood and affect.    Data Reviewed: Data reviewed as mentioned above.  Assessment and Plan:  Acute pancreatitis.  No alcohol use, elevated lipase with mildly elevated AST and ALT.  Alkaline phosphatase and T. bili normal.  RUQ ultrasound with concern of dilated bile duct so maybe some biliary obstruction/choledocholithiasis although no stone was seen on  ultrasound. Received 1 dose of ceftriaxone  in ED which was not continued as leukocytosis can be reactive. - Admit to MedSurg - MRCP was ordered by EDP and if positive she might need ERCP. - GI consult - Pain management-try avoiding opioids to decrease her risk of constipation with her history of recent hemorrhoidectomy. -Toradol  was ordered. - Supportive care.  Recent hemorrhoidectomy.  Patient had elective hemorrhoidectomy on Friday. - Bowel regimen was ordered to avoid constipation.  History of PCOS.  Patient takes metformin  at home for PCOS which is currently being held.    Advance Care Planning:   Code Status: Full Code discussed with patient and her husband  Consults: Gastroenterology  Family Communication: Discussed with husband at bedside  Severity of Illness: The appropriate patient status for this patient is OBSERVATION. Observation status is judged to be reasonable and necessary in order to provide the required intensity of service to ensure the patient's safety. The patient's presenting symptoms, physical exam findings, and initial radiographic and laboratory data in the context of their medical condition is felt to place them at decreased risk for further clinical deterioration. Furthermore, it is anticipated that the patient will be medically stable for discharge from the hospital within 2 midnights of admission.   This record has been created using Conservation officer, historic buildings. Errors have been sought and corrected,but may not always be located. Such creation errors do not reflect on the standard of care.   Author: Amaryllis Dare, MD 05/01/2024 12:46 PM  For on call review [email or web address].      [1]  Allergies Allergen Reactions   Bupropion Itching and Other (See Comments)   "

## 2024-05-01 NOTE — ED Triage Notes (Signed)
 Pt. Arrives for abdominal pain that started this morning. Reports pain to the middle of the shoulder blades that wraps around to the upper abdomen. Pt. Recently had a hemorrhoidectomy. Pt. Has a hx of PEs

## 2024-05-01 NOTE — ED Provider Notes (Signed)
 " [place] EMERGENCY DEPARTMENT AT [facility] Provider Note   CSN: [id number] Arrival date & time: 05/01/24  9354     Patient presents with: Abdominal Pain   Sheri [name] is a 34 y.o. female.  With a history of PCOS, and obesity who presents to the ED for abdominal pain.  Patient recently underwent hemorrhoidectomy on January 9 at [facility] [place]).  She had been recovering well at home but is now having more abdominal back pain.  For the last month or so she has had some mild abdominal pain that can occur after meals.  Associated nausea and thoracic back pain.  Voices concern for cholecystitis gallstones.  No fevers chills vomiting diarrhea.  Bowel movements have been mostly normal following hemorrhoidectomy.  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 Authorizing Provider  albuterol  (PROAIR  HFA) 108 (90 Base) MCG/ACT inhaler Inhale 2 puffs into the lungs every 6 (six) hours as needed for wheezing or shortness of breath. 04/30/22   Mercer Clotilda SAUNDERS, MD  Albuterol -Budesonide  (AIRSUPRA ) 90-80 MCG/ACT AERO INHALE 2 PUFFS INTO THE LUNGS EVERY 4 (FOUR) HOURS AS NEEDED (COUGHING, WHEEZING, CHEST TIGHTNESS). DO NOT EXCEED 12 PUFFS IN 24 HOURS. 05/01/24   Cari Arlean HERO, FNP  aspirin EC 81 MG tablet Take 81 mg by mouth daily. Swallow whole.    [provider]  BREO ELLIPTA  100-25 MCG/ACT AEPB INHALE 1 PUFF INTO THE LUNGS DAILY. RINSE MOUTH AFTER USE 02/17/24   Luke Orlan HERO, DO  busPIRone  (BUSPAR ) 15 MG tablet Take 1 tablet (15 mg total) by mouth 2 (two) times daily. 02/07/24   Arfeen, Leni DASEN, MD  cetirizine (ZYRTEC) 10 MG tablet Take 10 mg by mouth daily.    [provider]  cyanocobalamin  (VITAMIN B12) 500 MCG tablet Take 1 tablet (500 mcg total) by mouth daily. 01/26/24   Opalski, Barnie, DO  fluconazole  (DIFLUCAN ) 150 MG tablet  Take 1 tablet (150 mg total) by mouth once a week. For two weeks 03/27/24   Moishe Chiquita HERO, NP  ketoconazole  (NIZORAL ) 2 % cream Apply 1 Application topically daily. 03/27/24   Moishe Chiquita HERO, NP  lamoTRIgine  (LAMICTAL ) 100 MG tablet Take 1 tablet (100 mg total) by mouth daily. 02/07/24   Arfeen, Leni DASEN, MD  meloxicam  (MOBIC ) 15 MG tablet TAKE 1 TABLET (15 MG TOTAL) BY MOUTH DAILY. 05/11/23   Smith, Zachary M, DO  metFORMIN  (GLUCOPHAGE ) 500 MG tablet 2 po BID 03/23/24   Opalski, Barnie, DO  ondansetron  (ZOFRAN -ODT) 4 MG disintegrating tablet Take 1 tablet (4 mg total) by mouth every 8 (eight) hours as needed for nausea or vomiting. 04/10/24   Gladis Elsie BROCKS, PA-C  paragard intrauterine copper IUD IUD 1 each by Intrauterine route once. 01/01/15   [provider]  sertraline  (ZOLOFT ) 50 MG tablet Take 1.5 tablets (75 mg total) by mouth daily. 02/07/24   Arfeen, Leni DASEN, MD  tirzepatide  10 MG/0.5ML injection vial Inject 10 mg into the skin once a week. 03/23/24   Opalski, Deborah, DO  Vitamin D , Ergocalciferol , (DRISDOL ) 1.25 MG (50000 UNIT) CAPS capsule Take 1 capsule (50,000 Units total) by mouth every 7 (seven) days. 03/23/24   Midge Barnie, DO    Allergies: Bupropion    Review of Systems  Gastrointestinal:  Positive for abdominal pain.    Updated Vital Signs  BP 114/74 (BP Location: Right Arm)   Pulse 70   Temp 97.8 F (36.6 C) (Oral)   Resp 18   SpO2 100%   Physical Exam Vitals and nursing note reviewed.  HENT:     Head: Normocephalic and atraumatic.  Eyes:     Pupils: Pupils are equal, round, and reactive to light.  Cardiovascular:     Rate and Rhythm: Normal rate and regular rhythm.  Pulmonary:     Effort: Pulmonary effort is normal.     Breath sounds: Normal breath sounds.  Abdominal:     Palpations: Abdomen is soft.     Tenderness: There is abdominal tenderness in the right upper quadrant. There is no guarding or rebound. Positive signs include Murphy's sign.   Genitourinary:    Comments: RN chaperone present Mild erythema perianal region with sutures in place 2 loose sutures No active bleeding or purulent drainage Skin:    General: Skin is warm and dry.  Neurological:     Mental Status: She is alert.  Psychiatric:        Mood and Affect: Mood normal.     (all labs ordered are listed, but only abnormal results are displayed) Labs Reviewed  COMPREHENSIVE METABOLIC PANEL WITH GFR - Abnormal; Notable for the following components:      Result Value   Glucose, Bld 112 (*)    AST 157 (*)    ALT 89 (*)    All other components within normal limits  CBC - Abnormal; Notable for the following components:   WBC 12.5 (*)    All other components within normal limits  URINALYSIS, ROUTINE W REFLEX MICROSCOPIC - Abnormal; Notable for the following components:   Specific Gravity, Urine 1.044 (*)    Bilirubin Urine MODERATE (*)    Protein, ur 30 (*)    Bacteria, UA RARE (*)    All other components within normal limits  LIPASE, BLOOD - Abnormal; Notable for the following components:   Lipase 130 (*)    All other components within normal limits  HCG, QUANTITATIVE, PREGNANCY    EKG: None  Radiology: US  Abdomen Limited RUQ (LIVER/GB) Result Date: 05/01/2024 EXAM: Right Upper Quadrant Abdominal Ultrasound 05/01/2024 09:39:09 AM TECHNIQUE: Real-time ultrasonography of the right upper quadrant of the abdomen was performed. COMPARISON: US  Abdomen 01/18/2020. CLINICAL HISTORY: Cholecystitis. FINDINGS: LIVER: Slightly increased liver echotexture, unchanged. No intrahepatic biliary ductal dilatation. No evidence of mass. Hepatopetal flow in the portal vein. BILIARY SYSTEM: Gallbladder wall thickness measures 2.0 mm. No pericholecystic fluid. No cholelithiasis. The common bile duct measures 6.8 mm. OTHER: No right upper quadrant ascites. IMPRESSION: 1. No cholecystic liver changes or changes of acute cholecystitis. 2. The common bile duct is at the upper  limits of normal for size, measuring 7 mm. Correlation with serum biliary recommended. If elevated, a follow-up multiphase abdominal MRI with iv contrast should be considered for further characterization. 3. Findings suggestive of hepatic steatosis. Electronically signed by: Rogelia Myers MD MD 05/01/2024 10:14 AM EST RP Workstation: [id number]     Procedures   Medications Ordered in the ED  morphine  (PF) 4 MG/ML injection 4 mg (4 mg Intravenous Given 05/01/24 0918)  ondansetron  (ZOFRAN ) injection 4 mg (4 mg Intravenous Given 05/01/24 0923)  cefTRIAXone  (ROCEPHIN ) 2 g in sodium chloride  0.9 % 100 mL IVPB (2 g Intravenous New Bag/Given 05/01/24 1140)  morphine  (PF) 4 MG/ML injection 4 mg (4 mg Intravenous Given 05/01/24 1139)    Clinical Course as of 05/01/24 1224  Mon May 01, 2024  1152 Ultrasound shows no evidence of acute Coley cystitis but dilation of CBD.  In conjunction with laboratory findings including elevated LFTs lipase this is concerning for potential gallstone pancreatitis.  Discussed with Dr. Saintclair (GI) who agrees with plan for MRCP and admission.  GI will consult during admission.  Informed patient of this plan and she is in agreement as well.  Paged hospitalist [MP]  1223 Discussed with admitting hospitalist who accept patient for admission [MP]    Clinical Course User Index [MP] Pamella Ozell LABOR, DO                                 Medical Decision Making 34 year old female with history as above presents to the ED for abdominal pain back pain nausea.  Hemorrhoidectomy 3 days ago now with more abdominal pain.  Right upper quadrant tenderness on my exam.  Afebrile normotensive.  Initial laboratory workup ordered from triage shows elevation in LFTs and leukocytosis.  Concerning for acute cholecystitis versus biliary colic.  Also need to consider pancreatitis UTI kidney stones.  Will obtain right upper quadrant ultrasound morphine  for pain Zofran  for nausea  Amount and/or Complexity  of Data Reviewed Labs: ordered. Radiology: ordered.  Risk Prescription drug management. Decision regarding hospitalization.        Final diagnoses:  Gallstone pancreatitis    ED Discharge Orders     None          Pamella Ozell LABOR, DO 05/01/24 1224  "

## 2024-05-02 DIAGNOSIS — R112 Nausea with vomiting, unspecified: Secondary | ICD-10-CM | POA: Diagnosis not present

## 2024-05-02 DIAGNOSIS — R1010 Upper abdominal pain, unspecified: Secondary | ICD-10-CM

## 2024-05-02 DIAGNOSIS — R748 Abnormal levels of other serum enzymes: Secondary | ICD-10-CM | POA: Diagnosis not present

## 2024-05-02 LAB — COMPREHENSIVE METABOLIC PANEL WITH GFR
ALT: 165 U/L — ABNORMAL HIGH (ref 0–44)
AST: 82 U/L — ABNORMAL HIGH (ref 15–41)
Albumin: 3.6 g/dL (ref 3.5–5.0)
Alkaline Phosphatase: 78 U/L (ref 38–126)
Anion gap: 7 (ref 5–15)
BUN: 8 mg/dL (ref 6–20)
CO2: 27 mmol/L (ref 22–32)
Calcium: 9.1 mg/dL (ref 8.9–10.3)
Chloride: 105 mmol/L (ref 98–111)
Creatinine, Ser: 0.76 mg/dL (ref 0.44–1.00)
GFR, Estimated: >60 mL/min
Glucose, Bld: 91 mg/dL (ref 70–99)
Potassium: 3.8 mmol/L (ref 3.5–5.1)
Sodium: 139 mmol/L (ref 135–145)
Total Bilirubin: 0.3 mg/dL (ref 0.0–1.2)
Total Protein: 6.4 g/dL — ABNORMAL LOW (ref 6.5–8.1)

## 2024-05-02 LAB — HEPATITIS PANEL, ACUTE
HCV Ab: NONREACTIVE
Hep A IgM: NONREACTIVE
Hep B C IgM: NONREACTIVE
Hepatitis B Surface Ag: NONREACTIVE

## 2024-05-02 LAB — LIPASE, BLOOD: Lipase: 56 U/L — ABNORMAL HIGH (ref 11–51)

## 2024-05-02 LAB — CBC
HCT: 35.4 % — ABNORMAL LOW (ref 36.0–46.0)
Hemoglobin: 11.4 g/dL — ABNORMAL LOW (ref 12.0–15.0)
MCH: 28.3 pg (ref 26.0–34.0)
MCHC: 32.2 g/dL (ref 30.0–36.0)
MCV: 87.8 fL (ref 80.0–100.0)
Platelets: 312 K/uL (ref 150–400)
RBC: 4.03 MIL/uL (ref 3.87–5.11)
RDW: 13.6 % (ref 11.5–15.5)
WBC: 8.3 K/uL (ref 4.0–10.5)
nRBC: 0 % (ref 0.0–0.2)

## 2024-05-02 LAB — CK: Total CK: 32 U/L — ABNORMAL LOW (ref 38–234)

## 2024-05-02 NOTE — Discharge Summary (Signed)
 "  Physician Discharge Summary  Sheri [name] FMW:[id number] DOB: 12/16/1990 DOA: 05/01/2024  PCP: Sheri [name], MD  Admit date: 05/01/2024 Discharge date: 05/02/2024  Admitted From: Home Disposition: Home Recommendations for Outpatient Follow-up:  Outpatient follow-up with PCP in 1 week Advised to hold GLP-1 agonist given concern for pancreatitis and elevated LFT and to follow-up with prescriber Recheck CMP and CBC at follow-up Please follow up on the following pending results: None  Home Health: No need identified Equipment/Devices: No need identified  Discharge Condition: Stable CODE STATUS: Full code   Follow-up Information     Panosh, [name], MD. Schedule an appointment as soon as possible for a visit in 1 week(s).   Specialties: Internal Medicine, Pediatrics Contact information: [address] [place] [address] [phone]                 Hospital course 34 year old F with PMH of PCOS on metformin , IBS with diarrhea, provoked PE in the setting of OCP not on [facility], recent hemorrhoidectomy and obesity presented to ED with upper abdominal pain for 2 months that has acutely gotten worse for 1 day with associated nausea and vomiting.  Pain was worse with food especially greasy food.  Patient has been on GLP-1 agonist for about 4 to 5 months.  Does not drink alcohol.  In ED, stable vitals.  AST 157.  ALT 89.  Lipase 130.  UA with bilirubin and rare bacteria.  RUQ US  with CBD of 7 mm.  GI consulted.  MRCP ordered and was negative for acute finding but showed mild hepatomegaly.  Patient was started on IV fluid and clear liquid diet and admitted for overnight observation.  The next day, GI symptoms improved.  CK and acute hepatitis panel negative.  Lipase improved.  LFT stable.  She tolerated soft diet and felt well to go home.  Cleared for discharge by gastroenterology.  Encouraged to hold GLP-1 agonist until she talk to her prescriber.    See individual  problem list below for more.   Problems addressed during this hospitalization Elevated lipase: Acute pancreatitis ruled out.  No radiologic evidence.  Improved.  Tolerated soft diet. Elevated liver enzymes: Stable.  CK and acute hepatitis panel negative.  MRCP showed mild hepatomegaly.  PCOS-on metformin .  Could contribute to diarrhea History of IBS/diarrhea  Medication management - Discontinued medication that she reported not taking at home.  Class I obesity Body mass index is 30.9 kg/m.           Consultations: Gastroenterology  Time spent 35  minutes  Vital signs Vitals:   05/01/24 1903 05/02/24 0030 05/02/24 0545 05/02/24 0827  BP: 114/62 (!) 96/56 115/68   Pulse: 84 82 73   Temp: 98.1 F (36.7 C) 98.2 F (36.8 C) 98.2 F (36.8 C)   Resp: 18 18 18    Height: 5' 4 (1.626 m)     Weight: 81.6 kg     SpO2: 100% 100% 100% 97%  TempSrc: Oral Oral Oral   BMI (Calculated): 30.88        Discharge exam  GENERAL: No apparent distress.  Nontoxic. HEENT: MMM.  Vision and hearing grossly intact.  NECK: Supple.  No apparent JVD.  RESP:  No IWOB.  Fair aeration bilaterally. CVS:  RRR. Heart sounds normal.  ABD/GI/GU: BS+. Abd soft, NTND.  MSK/EXT:  Moves extremities. No apparent deformity. No edema.  SKIN: no apparent skin lesion or wound NEURO: Awake and alert. Oriented appropriately.  No apparent  focal neuro deficit. PSYCH: Calm. Normal affect.   Discharge Instructions Discharge Instructions     Discharge instructions   Complete by: As directed    It has been a pleasure taking care of you!  You were hospitalized abdominal pain, nausea and vomiting.  There is concern that this could be related to your weight loss medication.  Please talk to your prescriber before resuming this medication.  Continue soft bland diet for the next 1 to 2 days.  Avoid or minimize greasy foods.  Follow-up with your primary care doctor in 1 to 2 weeks or sooner if needed.   Take care,    Increase activity slowly   Complete by: As directed       Allergies as of 05/02/2024       Reactions   Bupropion Itching, Other (See Comments)        Medication List     PAUSE taking these medications    tirzepatide  10 MG/0.5ML injection vial Wait to take this until: May 09, 2024 Inject 10 mg into the skin once a week.       STOP taking these medications    aspirin EC 81 MG tablet   fluconazole  150 MG tablet Commonly known as: DIFLUCAN    HYDROcodone-acetaminophen  5-325 MG tablet Commonly known as: NORCO/VICODIN   ketoconazole  2 % cream Commonly known as: NIZORAL    meloxicam  15 MG tablet Commonly known as: MOBIC        TAKE these medications    Airsupra  90-80 MCG/ACT Aero Generic drug: Albuterol -Budesonide  INHALE 2 PUFFS INTO THE LUNGS EVERY 4 (FOUR) HOURS AS NEEDED (COUGHING, WHEEZING, CHEST TIGHTNESS). DO NOT EXCEED 12 PUFFS IN 24 HOURS.   albuterol  108 (90 Base) MCG/ACT inhaler Commonly known as: ProAir  HFA Inhale 2 puffs into the lungs every 6 (six) hours as needed for wheezing or shortness of breath.   Breo Ellipta  100-25 MCG/ACT Aepb Generic drug: fluticasone  furoate-vilanterol INHALE 1 PUFF INTO THE LUNGS DAILY. RINSE MOUTH AFTER USE What changed: See the new instructions.   busPIRone  15 MG tablet Commonly known as: BUSPAR  Take 1 tablet (15 mg total) by mouth 2 (two) times daily.   cetirizine 10 MG tablet Commonly known as: ZYRTEC Take 10 mg by mouth daily.   cyanocobalamin  500 MCG tablet Commonly known as: VITAMIN B12 Take 1 tablet (500 mcg total) by mouth daily.   ibuprofen  800 MG tablet Commonly known as: ADVIL  Take 800 mg by mouth every 6 (six) hours as needed for mild pain (pain score 1-3).   lamoTRIgine  100 MG tablet Commonly known as: LaMICtal  Take 1 tablet (100 mg total) by mouth daily.   metFORMIN  500 MG tablet Commonly known as: GLUCOPHAGE  2 po BID What changed:  how much to take how to take this when to take  this   methocarbamol  750 MG tablet Commonly known as: ROBAXIN  Take 750 mg by mouth 4 (four) times daily as needed for muscle spasms.   ondansetron  4 MG disintegrating tablet Commonly known as: ZOFRAN -ODT Take 1 tablet (4 mg total) by mouth every 8 (eight) hours as needed for nausea or vomiting.   paragard intrauterine copper Iud IUD 1 each by Intrauterine route once.   sertraline  50 MG tablet Commonly known as: ZOLOFT  Take 1.5 tablets (75 mg total) by mouth daily.   Vitamin D  (Ergocalciferol ) 1.25 MG (50000 UNIT) Caps capsule Commonly known as: DRISDOL  Take 1 capsule (50,000 Units total) by mouth every 7 (seven) days. What changed: additional instructions  Procedures/Studies:   MR ABDOMEN MRCP W WO CONTRAST Result Date: 05/01/2024 CLINICAL DATA:  RUQ abdominal pain, biliary disease suspected, US  nondiagnostic. EXAM: MRI ABDOMEN WITHOUT AND WITH CONTRAST (INCLUDING MRCP) TECHNIQUE: Multiplanar multisequence MR imaging of the abdomen was performed both before and after the administration of intravenous contrast. Heavily T2-weighted images of the biliary and pancreatic ducts were obtained, and three-dimensional MRCP images were rendered by post processing. CONTRAST:  8mL GADAVIST  GADOBUTROL  1 MMOL/ML IV SOLN COMPARISON:  Ultrasound abdomen limited from 05/01/2024. FINDINGS: Lower chest: Unremarkable MR appearance to the lung bases. No pleural effusion. No pericardial effusion. Normal heart size. Hepatobiliary: The liver is mildly enlarged in size measuring up to 17.2 cm in length. Noncirrhotic configuration. No focal liver lesion. No intrahepatic or extrahepatic bile duct dilatation. The extrahepatic bile duct measures up to 5 mm in diameter. No choledocholithiasis. Unremarkable gallbladder. Pancreas: No mass, inflammatory changes or other parenchymal abnormality identified. No main pancreatic duct dilation. Spleen:  Within normal limits in size and appearance. No focal mass.  Adrenals/Urinary Tract: Unremarkable adrenal glands. No hydroureteronephrosis. No suspicious renal mass. Stomach/Bowel: Visualized portions within the abdomen are unremarkable. No disproportionate dilation of bowel loops. Vascular/Lymphatic: No pathologically enlarged lymph nodes identified. No abdominal aortic aneurysm demonstrated. No ascites. Other:  None. Musculoskeletal: No suspicious bone lesions identified. IMPRESSION: *Mild hepatomegaly. *No intrahepatic or extrahepatic bile duct dilation. No choledocholithiasis or obstructing mass. *Otherwise unremarkable exam. Electronically Signed   By: Ree Molt M.D.   On: 05/01/2024 14:14   MR 3D Recon At Scanner Result Date: 05/01/2024 CLINICAL DATA:  RUQ abdominal pain, biliary disease suspected, US  nondiagnostic. EXAM: MRI ABDOMEN WITHOUT AND WITH CONTRAST (INCLUDING MRCP) TECHNIQUE: Multiplanar multisequence MR imaging of the abdomen was performed both before and after the administration of intravenous contrast. Heavily T2-weighted images of the biliary and pancreatic ducts were obtained, and three-dimensional MRCP images were rendered by post processing. CONTRAST:  8mL GADAVIST  GADOBUTROL  1 MMOL/ML IV SOLN COMPARISON:  Ultrasound abdomen limited from 05/01/2024. FINDINGS: Lower chest: Unremarkable MR appearance to the lung bases. No pleural effusion. No pericardial effusion. Normal heart size. Hepatobiliary: The liver is mildly enlarged in size measuring up to 17.2 cm in length. Noncirrhotic configuration. No focal liver lesion. No intrahepatic or extrahepatic bile duct dilatation. The extrahepatic bile duct measures up to 5 mm in diameter. No choledocholithiasis. Unremarkable gallbladder. Pancreas: No mass, inflammatory changes or other parenchymal abnormality identified. No main pancreatic duct dilation. Spleen:  Within normal limits in size and appearance. No focal mass. Adrenals/Urinary Tract: Unremarkable adrenal glands. No hydroureteronephrosis. No  suspicious renal mass. Stomach/Bowel: Visualized portions within the abdomen are unremarkable. No disproportionate dilation of bowel loops. Vascular/Lymphatic: No pathologically enlarged lymph nodes identified. No abdominal aortic aneurysm demonstrated. No ascites. Other:  None. Musculoskeletal: No suspicious bone lesions identified. IMPRESSION: *Mild hepatomegaly. *No intrahepatic or extrahepatic bile duct dilation. No choledocholithiasis or obstructing mass. *Otherwise unremarkable exam. Electronically Signed   By: Ree Molt M.D.   On: 05/01/2024 14:14   US  Abdomen Limited RUQ (LIVER/GB) Result Date: 05/01/2024 EXAM: Right Upper Quadrant Abdominal Ultrasound 05/01/2024 09:39:09 AM TECHNIQUE: Real-time ultrasonography of the right upper quadrant of the abdomen was performed. COMPARISON: US  Abdomen 01/18/2020. CLINICAL HISTORY: Cholecystitis. FINDINGS: LIVER: Slightly increased liver echotexture, unchanged. No intrahepatic biliary ductal dilatation. No evidence of mass. Hepatopetal flow in the portal vein. BILIARY SYSTEM: Gallbladder wall thickness measures 2.0 mm. No pericholecystic fluid. No cholelithiasis. The common bile duct measures 6.8 mm. OTHER: No right upper quadrant ascites. IMPRESSION:  1. No cholecystic liver changes or changes of acute cholecystitis. 2. The common bile duct is at the upper limits of normal for size, measuring 7 mm. Correlation with serum biliary recommended. If elevated, a follow-up multiphase abdominal MRI with iv contrast should be considered for further characterization. 3. Findings suggestive of hepatic steatosis. Electronically signed by: Rogelia Myers MD MD 05/01/2024 10:14 AM EST RP Workstation: [id number]       The results of significant diagnostics from this hospitalization (including imaging, microbiology, ancillary and laboratory) are listed below for reference.     Microbiology: No results found for this or any previous visit (from the past 240 hours).    Labs:  CBC: Recent Labs  Lab 05/01/24 0740 05/01/24 1259 05/02/24 0429  WBC 12.5* 10.1 8.3  HGB 13.1 12.1 11.4*  HCT 40.0 38.5 35.4*  MCV 88.3 88.5 87.8  PLT 372 368 312   BMP &GFR Recent Labs  Lab 05/01/24 0740 05/01/24 1259 05/02/24 0429  NA 137  --  139  K 3.7  --  3.8  CL 102  --  105  CO2 26  --  27  GLUCOSE 112*  --  91  BUN 11  --  8  CREATININE 0.78 0.76 0.76  CALCIUM 9.3  --  9.1   Estimated Creatinine Clearance: 102.5 mL/min (by C-G formula based on SCr of 0.76 mg/dL). Liver & Pancreas: Recent Labs  Lab 05/01/24 0740 05/02/24 0429  AST 157* 82*  ALT 89* 165*  ALKPHOS 70 78  BILITOT 0.3 0.3  PROT 7.6 6.4*  ALBUMIN 4.1 3.6   Recent Labs  Lab 05/01/24 0740 05/02/24 0429  LIPASE 130* 56*   No results for input(s): AMMONIA in the last 168 hours. Diabetic: No results for input(s): HGBA1C in the last 72 hours. No results for input(s): GLUCAP in the last 168 hours. Cardiac Enzymes: Recent Labs  Lab 05/02/24 0429  CKTOTAL 32*   No results for input(s): PROBNP in the last 8760 hours. Coagulation Profile: No results for input(s): INR, PROTIME in the last 168 hours. Thyroid  Function Tests: No results for input(s): TSH, T4TOTAL, FREET4, T3FREE, THYROIDAB in the last 72 hours. Lipid Profile: No results for input(s): CHOL, HDL, LDLCALC, TRIG, CHOLHDL, LDLDIRECT in the last 72 hours. Anemia Panel: No results for input(s): VITAMINB12, FOLATE, FERRITIN, TIBC, IRON , RETICCTPCT in the last 72 hours. Urine analysis:    Component Value Date/Time   COLORURINE YELLOW 05/01/2024 0902   APPEARANCEUR CLEAR 05/01/2024 0902   LABSPEC 1.044 (H) 05/01/2024 0902   PHURINE 5.0 05/01/2024 0902   GLUCOSEU NEGATIVE 05/01/2024 0902   HGBUR NEGATIVE 05/01/2024 0902   BILIRUBINUR MODERATE (A) 05/01/2024 0902   KETONESUR NEGATIVE 05/01/2024 0902   PROTEINUR 30 (A) 05/01/2024 0902   NITRITE NEGATIVE 05/01/2024 0902    LEUKOCYTESUR NEGATIVE 05/01/2024 0902   Sepsis Labs: Invalid input(s): PROCALCITONIN, LACTICIDVEN   SIGNED:  Maleka Contino T Astraea Gaughran, MD  [facility] 05/02/2024, 5:10 PM   "

## 2024-05-02 NOTE — Progress Notes (Signed)
 Discharge instructions reviewed with patient, verbalized understanding. All questions answered. All belongings accounted for. Patient to follow up with MD in  1-2 weeks. PIV removed. Assisted via WC to private vehicle.

## 2024-05-02 NOTE — Plan of Care (Signed)

## 2024-05-04 ENCOUNTER — Other Ambulatory Visit (HOSPITAL_COMMUNITY): Payer: Self-pay | Admitting: Psychiatry

## 2024-05-04 DIAGNOSIS — F39 Unspecified mood [affective] disorder: Secondary | ICD-10-CM

## 2024-05-04 DIAGNOSIS — F419 Anxiety disorder, unspecified: Secondary | ICD-10-CM

## 2024-05-08 ENCOUNTER — Telehealth (HOSPITAL_COMMUNITY): Admitting: Psychiatry

## 2024-05-08 ENCOUNTER — Encounter (HOSPITAL_COMMUNITY): Payer: Self-pay | Admitting: Psychiatry

## 2024-05-08 DIAGNOSIS — F419 Anxiety disorder, unspecified: Secondary | ICD-10-CM

## 2024-05-08 DIAGNOSIS — F39 Unspecified mood [affective] disorder: Secondary | ICD-10-CM | POA: Diagnosis not present

## 2024-05-08 MED ORDER — LAMOTRIGINE 100 MG PO TABS: 100.0000 mg | ORAL_TABLET | Freq: Every day | ORAL | 0 refills | Status: AC

## 2024-05-08 MED ORDER — BUSPIRONE HCL 15 MG PO TABS: 15.0000 mg | ORAL_TABLET | Freq: Two times a day (BID) | ORAL | 0 refills | Status: AC

## 2024-05-08 MED ORDER — SERTRALINE HCL 50 MG PO TABS: 75.0000 mg | ORAL_TABLET | Freq: Every day | ORAL | 0 refills | Status: AC

## 2024-05-08 NOTE — Progress Notes (Signed)
 " [facility] MD Virtual Progress Note   Patient Location: Home Provider Location: Home Office  I connect with patient by video and verified that I am speaking with correct person by using two identifiers. I discussed the limitations of evaluation and management by telemedicine and the availability of in person appointments. I also discussed with the patient that there may be a patient responsible charge related to this service. The patient expressed understanding and agreed to proceed.  Sheri [name] [id number] 34 y.o.  05/08/2024 11:33 AM  History of Present Illness:  Patient is evaluated by video session.  After a while the video interrupted and I called the patient on the phone.  She reported things are going very well.  She is taking Zoloft  in the morning because at night it was causing sweating.  Now she is tolerating very well and have no issues.  She sleeps good.  She recently had hemorrhoidectomy and she is recovering from it.  Patient told her Zepbound  is on hold for now.  She lost the weight since the last visit.  She reported Christmas was good.  She denies any panic attack, crying spells, feeling of hopelessness or worthlessness.  She had a good support from her husband.  She has a 71-year-old daughter.  She has no rash itching tremor or shakes or any EPS.  She is compliant with Zoloft , BuSpar  and Lamictal .  Her energy level is good.  She like to keep the current medicine since it is working well for her psychiatric symptoms.  Past Psychiatric History: H/O depression, anxiety, Bipolar, post partum and ADHD. On medication since age 44.  H/O addiction with opiates, Benzo and Adderall. Received therapy and medications at [facility] counseling and [facility] but not happy with care. Had rehab at [facility] in 2015.  Did well for 5 years without medication until symptoms returns. H/O suicidal thoughts but no attempt. No psychosis.  Tried Celexa caused  dizziness, trazodone, weight gain.  Took Lamictal , Klonopin, Lexapro, hydroxyzine , Ambien , Seroquel, Latuda, Hydroxyzine  and Wellbutrin.      Past Medical History:  Diagnosis Date   Alcohol addiction (HCC)    Anemia    Anxiety    Back pain    Bipolar depression (HCC)    under psych rx    Carpal tunnel syndrome    Chest pain    Depression    Drug addiction in remission [facility])    heroin   Enteritis due to Norovirus 05/18/2016   Heartburn    Hx of pulmonary embolus 02/13/2015   Hyperlipidemia    Hypokalemia 05/17/2016   IUD (intrauterine device) in place 02/13/2015   Joint pain    Mild mitral regurgitation    Mild tricuspid regurgitation    Mitral regurgitation 06/13/2018   Near syncope 05/16/2016   a. felt due to norovirus.   Nonallopathic lesion of cervical region 09/01/2018   Nonallopathic lesion of rib cage 09/01/2018   Obesity    Orthostatic hypotension 05/18/2016   Palpitations    PCOS (polycystic ovarian syndrome) 02/13/2015   Prediabetes    Pulmonary embolism (HCC) 2011   neg heme evaluation  felt to be from ocps    Recurrent upper respiratory infection (URI)    Scapular dyskinesis 09/01/2018   Smoker 12/30/2016   Syncope and collapse 05/16/2016   Thoracic outlet syndrome of left thoracic outlet 02/15/2018   Thoracic radiculopathy due to degenerative joint disease of spine 10/13/2016   CT and XR show at least 3 levels of  DDD of thoracic spine  I suspect this is related to both the scoliosis and the history of dance   Urticaria     Outpatient Encounter Medications as of 05/08/2024  Medication Sig   albuterol  (PROAIR  HFA) 108 (90 Base) MCG/ACT inhaler Inhale 2 puffs into the lungs every 6 (six) hours as needed for wheezing or shortness of breath. (Patient not taking: Reported on 05/01/2024)   Albuterol -Budesonide  (AIRSUPRA ) 90-80 MCG/ACT AERO INHALE 2 PUFFS INTO THE LUNGS EVERY 4 (FOUR) HOURS AS NEEDED (COUGHING, WHEEZING, CHEST TIGHTNESS). DO NOT EXCEED 12 PUFFS IN  24 HOURS.   BREO ELLIPTA  100-25 MCG/ACT AEPB INHALE 1 PUFF INTO THE LUNGS DAILY. RINSE MOUTH AFTER USE (Patient taking differently: Inhale 1 puff into the lungs daily.)   busPIRone  (BUSPAR ) 15 MG tablet Take 1 tablet (15 mg total) by mouth 2 (two) times daily.   cetirizine (ZYRTEC) 10 MG tablet Take 10 mg by mouth daily.   cyanocobalamin  (VITAMIN B12) 500 MCG tablet Take 1 tablet (500 mcg total) by mouth daily. (Patient not taking: Reported on 05/01/2024)   ibuprofen  (ADVIL ) 800 MG tablet Take 800 mg by mouth every 6 (six) hours as needed for mild pain (pain score 1-3).   lamoTRIgine  (LAMICTAL ) 100 MG tablet Take 1 tablet (100 mg total) by mouth daily.   metFORMIN  (GLUCOPHAGE ) 500 MG tablet 2 po BID (Patient taking differently: Take 500 mg by mouth 2 (two) times daily with a meal. 2 po BID)   methocarbamol  (ROBAXIN ) 750 MG tablet Take 750 mg by mouth 4 (four) times daily as needed for muscle spasms.   ondansetron  (ZOFRAN -ODT) 4 MG disintegrating tablet Take 1 tablet (4 mg total) by mouth every 8 (eight) hours as needed for nausea or vomiting.   paragard intrauterine copper IUD IUD 1 each by Intrauterine route once. (Patient not taking: Reported on 05/01/2024)   sertraline  (ZOLOFT ) 50 MG tablet Take 1.5 tablets (75 mg total) by mouth daily.   [Paused] tirzepatide  10 MG/0.5ML injection vial Inject 10 mg into the skin once a week.   Vitamin D , Ergocalciferol , (DRISDOL ) 1.25 MG (50000 UNIT) CAPS capsule Take 1 capsule (50,000 Units total) by mouth every 7 (seven) days. (Patient taking differently: Take 50,000 Units by mouth every 7 (seven) days. Mondays)   No facility-administered encounter medications on file as of 05/08/2024.    Recent Results (from the past 2160 hours)  Vitamin B12     Status: Abnormal   Collection Time: 02/24/24  8:21 AM  Result Value Ref Range   Vitamin B-12 >2000 (H) 232 - 1245 pg/mL  VITAMIN D  25 Hydroxy (Vit-D Deficiency, Fractures)     Status: None   Collection Time:  02/24/24  8:21 AM  Result Value Ref Range   Vit D, 25-Hydroxy 75.5 30.0 - 100.0 ng/mL    Comment: Vitamin D  deficiency has been defined by the Institute of Medicine and an Endocrine Society practice guideline as a level of serum 25-OH vitamin D  less than 20 ng/mL (1,2). The Endocrine Society went on to further define vitamin D  insufficiency as a level between 21 and 29 ng/mL (2). 1. IOM (Institute of Medicine). 2010. Dietary reference    intakes for calcium and D. [place]: The    [employer]. 2. Holick MF, Binkley [place], Bischoff-Ferrari HA, et al.    Evaluation, treatment, and prevention of vitamin D     deficiency: an Endocrine Society clinical practice    guideline. JCEM. 2011 Jul; 96(7):1911-30.   Hemoglobin A1c  Status: None   Collection Time: 02/24/24  8:21 AM  Result Value Ref Range   Hgb A1c MFr Bld 5.2 4.8 - 5.6 %    Comment:          Prediabetes: 5.7 - 6.4          Diabetes: >6.4          Glycemic control for adults with diabetes: <7.0    Est. average glucose Bld gHb Est-mCnc 103 mg/dL  Insulin , random     Status: None   Collection Time: 02/24/24  8:21 AM  Result Value Ref Range   INSULIN  11.7 2.6 - 24.9 uIU/mL  Basic metabolic panel with GFR     Status: None   Collection Time: 02/24/24  8:21 AM  Result Value Ref Range   Glucose 81 70 - 99 mg/dL   BUN 16 6 - 20 mg/dL   Creatinine, Ser 9.21 0.57 - 1.00 mg/dL   eGFR 897 >40 fO/fpw/8.26   BUN/Creatinine Ratio 21 9 - 23   Sodium 139 134 - 144 mmol/L   Potassium 4.4 3.5 - 5.2 mmol/L   Chloride 103 96 - 106 mmol/L   CO2 20 20 - 29 mmol/L   Calcium 10.0 8.7 - 10.2 mg/dL  TSH     Status: None   Collection Time: 02/24/24  8:21 AM  Result Value Ref Range   TSH 2.210 0.450 - 4.500 uIU/mL  T4, free     Status: None   Collection Time: 02/24/24  8:21 AM  Result Value Ref Range   Free T4 1.29 0.82 - 1.77 ng/dL  Comprehensive metabolic panel     Status: Abnormal   Collection Time: 05/01/24  7:40 AM   Result Value Ref Range   Sodium 137 135 - 145 mmol/L   Potassium 3.7 3.5 - 5.1 mmol/L   Chloride 102 98 - 111 mmol/L   CO2 26 22 - 32 mmol/L   Glucose, Bld 112 (H) 70 - 99 mg/dL    Comment: Glucose reference range applies only to samples taken after fasting for at least 8 hours.   BUN 11 6 - 20 mg/dL   Creatinine, Ser 9.21 0.44 - 1.00 mg/dL   Calcium 9.3 8.9 - 89.6 mg/dL   Total Protein 7.6 6.5 - 8.1 g/dL   Albumin 4.1 3.5 - 5.0 g/dL   AST 842 (H) 15 - 41 U/L   ALT 89 (H) 0 - 44 U/L   Alkaline Phosphatase 70 38 - 126 U/L   Total Bilirubin 0.3 0.0 - 1.2 mg/dL   GFR, Estimated >39 >39 mL/min    Comment: (NOTE) Calculated using the CKD-EPI Creatinine Equation (2021)    Anion gap 9 5 - 15    Comment: Performed at [facility], [address], [place], [place] [address]  CBC     Status: Abnormal   Collection Time: 05/01/24  7:40 AM  Result Value Ref Range   WBC 12.5 (H) 4.0 - 10.5 K/uL   RBC 4.53 3.87 - 5.11 MIL/uL   Hemoglobin 13.1 12.0 - 15.0 g/dL   HCT 59.9 63.9 - 53.9 %   MCV 88.3 80.0 - 100.0 fL   MCH 28.9 26.0 - 34.0 pg   MCHC 32.8 30.0 - 36.0 g/dL   RDW 86.4 88.4 - 84.4 %   Platelets 372 150 - 400 K/uL   nRBC 0.0 0.0 - 0.2 %    Comment: Performed at [facility], [address] [place],  [place] [address]  hCG, quantitative, pregnancy     Status: None   Collection Time: 05/01/24  7:40 AM  Result Value Ref Range   hCG, Beta Chain, Quant, S <1 <5 mIU/mL    Comment:          GEST. AGE      CONC.  (mIU/mL)   <=1 WEEK        5 - 50     2 WEEKS       50 - 500     3 WEEKS       100 - 10,000     4 WEEKS     1,000 - 30,000     5 WEEKS     3,500 - 115,000   6-8 WEEKS     12,000 - 270,000    12 WEEKS     15,000 - 220,000        FEMALE AND NON-PREGNANT FEMALE:     LESS THAN 5 mIU/mL Performed at [facility], [address], [place], [place] [address]   Lipase, blood     Status: Abnormal   Collection Time: 05/01/24   7:40 AM  Result Value Ref Range   Lipase 130 (H) 11 - 51 U/L    Comment: Performed at [facility], [address], [place], [place] [address]  Urinalysis, Routine w reflex microscopic -Urine, Clean Catch     Status: Abnormal   Collection Time: 05/01/24  9:02 AM  Result Value Ref Range   Color, Urine YELLOW YELLOW   APPearance CLEAR CLEAR   Specific Gravity, Urine 1.044 (H) 1.005 - 1.030   pH 5.0 5.0 - 8.0   Glucose, UA NEGATIVE NEGATIVE mg/dL   Hgb urine dipstick NEGATIVE NEGATIVE   Bilirubin Urine MODERATE (A) NEGATIVE   Ketones, ur NEGATIVE NEGATIVE mg/dL   Protein, ur 30 (A) NEGATIVE mg/dL   Nitrite NEGATIVE NEGATIVE   Leukocytes,Ua NEGATIVE NEGATIVE   RBC / HPF 0-5 0 - 5 RBC/hpf   WBC, UA 0-5 0 - 5 WBC/hpf   Bacteria, UA RARE (A) NONE SEEN   Squamous Epithelial / HPF 0-5 0 - 5 /HPF   Mucus PRESENT     Comment: Performed at [facility], [address], [place], [place] [address]  HIV Antibody (routine testing w rflx)     Status: None   Collection Time: 05/01/24 12:59 PM  Result Value Ref Range   HIV Screen 4th Generation wRfx Non Reactive Non Reactive    Comment: Performed at [facility], [address] [place], [place] [address]  CBC     Status: None   Collection Time: 05/01/24 12:59 PM  Result Value Ref Range   WBC 10.1 4.0 - 10.5 K/uL   RBC 4.35 3.87 - 5.11 MIL/uL   Hemoglobin 12.1 12.0 - 15.0 g/dL   HCT 61.4 63.9 - 53.9 %   MCV 88.5 80.0 - 100.0 fL   MCH 27.8 26.0 - 34.0 pg   MCHC 31.4 30.0 - 36.0 g/dL   RDW 86.5 88.4 - 84.4 %   Platelets 368 150 - 400 K/uL   nRBC 0.0 0.0 - 0.2 %    Comment: Performed at [facility], [address] [place], [place] [address]  Creatinine, serum     Status: None   Collection Time: 05/01/24 12:59 PM  Result Value Ref Range   Creatinine, Ser 0.76 0.44 - 1.00 mg/dL   GFR, Estimated >39 >  60 mL/min    Comment: (NOTE) Calculated using the CKD-EPI Creatinine Equation  (2021) Performed at [facility], [address], [place], [place] [address]   CBC     Status: Abnormal   Collection Time: 05/02/24  4:29 AM  Result Value Ref Range   WBC 8.3 4.0 - 10.5 K/uL   RBC 4.03 3.87 - 5.11 MIL/uL   Hemoglobin 11.4 (L) 12.0 - 15.0 g/dL   HCT 64.5 (L) 63.9 - 53.9 %   MCV 87.8 80.0 - 100.0 fL   MCH 28.3 26.0 - 34.0 pg   MCHC 32.2 30.0 - 36.0 g/dL   RDW 86.3 88.4 - 84.4 %   Platelets 312 150 - 400 K/uL   nRBC 0.0 0.0 - 0.2 %    Comment: Performed at [facility], [address], [place], [place] [address]  Comprehensive metabolic panel     Status: Abnormal   Collection Time: 05/02/24  4:29 AM  Result Value Ref Range   Sodium 139 135 - 145 mmol/L   Potassium 3.8 3.5 - 5.1 mmol/L   Chloride 105 98 - 111 mmol/L   CO2 27 22 - 32 mmol/L   Glucose, Bld 91 70 - 99 mg/dL    Comment: Glucose reference range applies only to samples taken after fasting for at least 8 hours.   BUN 8 6 - 20 mg/dL   Creatinine, Ser 9.23 0.44 - 1.00 mg/dL   Calcium 9.1 8.9 - 89.6 mg/dL   Total Protein 6.4 (L) 6.5 - 8.1 g/dL   Albumin 3.6 3.5 - 5.0 g/dL   AST 82 (H) 15 - 41 U/L   ALT 165 (H) 0 - 44 U/L   Alkaline Phosphatase 78 38 - 126 U/L   Total Bilirubin 0.3 0.0 - 1.2 mg/dL   GFR, Estimated >39 >39 mL/min    Comment: (NOTE) Calculated using the CKD-EPI Creatinine Equation (2021)    Anion gap 7 5 - 15    Comment: Performed at [facility], [address] [place], [place] [address]  Lipase, blood     Status: Abnormal   Collection Time: 05/02/24  4:29 AM  Result Value Ref Range   Lipase 56 (H) 11 - 51 U/L    Comment: Performed at [facility], [address] [place], [place] [address]  CK     Status: Abnormal   Collection Time: 05/02/24  4:29 AM  Result Value Ref Range   Total CK 32 (L) 38 - 234 U/L    Comment: Performed at [facility], [address] [place], [place] [address]   Hepatitis panel, acute     Status: None   Collection Time: 05/02/24  4:29 AM  Result Value Ref Range   Hepatitis B Surface Ag NON REACTIVE NON REACTIVE   HCV Ab NON REACTIVE NON REACTIVE    Comment: (NOTE) Nonreactive HCV antibody screen is consistent with no HCV infections,  unless recent infection is suspected or other evidence exists to indicate HCV infection.     Hep A IgM NON REACTIVE NON REACTIVE   Hep B C IgM NON REACTIVE NON REACTIVE    Comment: Performed at [facility], [address], [place], [place] [address]     Psychiatric Specialty Exam: Physical Exam  Review of Systems  Weight 180 lb (81.6 kg).There is no height or weight on file to calculate BMI.  General Appearance: Casual  Eye Contact:  Good  Speech:  Normal Rate  Volume:  Normal  Mood:  pleasant   Affect:  Appropriate  Thought Process:  Goal Directed  Orientation:  Full (Time, Place, and Person)  Thought Content:  Logical  Suicidal Thoughts:  No  Homicidal Thoughts:  No  Memory:  Immediate;   Good Recent;   Good Remote;   Good  Judgement:  Good  Insight:  Good  Psychomotor Activity:  Normal  Concentration:  Concentration: Good and Attention Span: Good  Recall:  Good  Fund of Knowledge:  Good  Language:  Good  Akathisia:  No  Handed:  Right  AIMS (if indicated):     Assets:  Communication Skills Desire for Improvement Housing Resilience Social Support Talents/Skills Transportation  ADL's:  Intact  Cognition:  WNL  Sleep:  ok       04/15/2023    8:59 AM 04/30/2022    2:10 PM 01/06/2022    2:45 PM 06/05/2020    1:47 PM 06/02/2016    3:44 PM  Depression screen PHQ 2/9  Decreased Interest 0 0 0 0 0  Down, Depressed, Hopeless 0 0 0 0 0  PHQ - 2 Score 0 0 0 0 0  Altered sleeping 0 0 0    Tired, decreased energy 0 0 1    Change in appetite 0 0 1    Feeling bad or failure about yourself  0 0 0    Trouble concentrating 0 0 0    Moving slowly or fidgety/restless 0 0 0    Suicidal  thoughts 0 0 0    PHQ-9 Score 0  0  2     Difficult doing work/chores Not difficult at all Not difficult at all Not difficult at all       Data saved with a previous flowsheet row definition    Assessment/Plan: Anxiety - Plan: busPIRone  (BUSPAR ) 15 MG tablet, lamoTRIgine  (LAMICTAL ) 100 MG tablet, sertraline  (ZOLOFT ) 50 MG tablet  Mood disorder - Plan: lamoTRIgine  (LAMICTAL ) 100 MG tablet, sertraline  (ZOLOFT ) 50 MG tablet  Patient is 34 year old Caucasian married female who has anxiety and mood disorder.  Currently stable on BuSpar , Lamictal  and Zoloft .  She moved Zoloft  in the morning and has noticed night sweats taking at night.  Reviewed blood work results.  Alkaline phosphatase 165 and AST 82.  She is seeing her primary care and also in therapy with [facility].  Discussed medication side effects and benefits.  Continue Lamictal  100 mg daily, BuSpar  15 mg 2 times a day and Zoloft  25 mg in the morning.  Recommend to call back if there is any question or any concern.  Follow-up in 3 months.  Follow Up Instructions:     I discussed the assessment and treatment plan with the patient. The patient was provided an opportunity to ask questions and all were answered. The patient agreed with the plan and demonstrated an understanding of the instructions.   The patient was advised to call back or seek an in-person evaluation if the symptoms worsen or if the condition fails to improve as anticipated.    Collaboration of Care: Other provider involved in patient's care AEB notes are available in epic to review  Patient/Guardian was advised Release of Information must be obtained prior to any record release in order to collaborate their care with an outside provider. Patient/Guardian was advised if they have not already done so to contact the registration department to sign all necessary forms in order for us  to  release information regarding their care.   Consent: Patient/Guardian gives verbal  consent for treatment and assignment of benefits for services provided during this visit. Patient/Guardian expressed understanding and agreed to proceed.     Total encounter time 18 minutes which includes face-to-face time, chart reviewed, care coordination, order entry and documentation during this encounter.   Note: This document was prepared by [employer] voice dictation technology and any errors that results from this process are unintentional.    Leni ONEIDA Client, MD 05/08/2024   "

## 2024-05-09 ENCOUNTER — Inpatient Hospital Stay: Admitting: Internal Medicine

## 2024-05-21 ENCOUNTER — Other Ambulatory Visit (INDEPENDENT_AMBULATORY_CARE_PROVIDER_SITE_OTHER): Payer: Self-pay | Admitting: Family Medicine

## 2024-05-21 ENCOUNTER — Encounter (INDEPENDENT_AMBULATORY_CARE_PROVIDER_SITE_OTHER): Payer: Self-pay | Admitting: Family Medicine

## 2024-05-21 DIAGNOSIS — R7303 Prediabetes: Secondary | ICD-10-CM

## 2024-05-22 ENCOUNTER — Encounter (INDEPENDENT_AMBULATORY_CARE_PROVIDER_SITE_OTHER): Payer: Self-pay | Admitting: Family Medicine

## 2024-05-22 ENCOUNTER — Telehealth (INDEPENDENT_AMBULATORY_CARE_PROVIDER_SITE_OTHER): Admitting: Family Medicine

## 2024-05-22 VITALS — Ht 64.0 in | Wt 177.0 lb

## 2024-05-22 DIAGNOSIS — R748 Abnormal levels of other serum enzymes: Secondary | ICD-10-CM

## 2024-05-22 DIAGNOSIS — Z6833 Body mass index (BMI) 33.0-33.9, adult: Secondary | ICD-10-CM

## 2024-05-22 DIAGNOSIS — E559 Vitamin D deficiency, unspecified: Secondary | ICD-10-CM

## 2024-05-22 DIAGNOSIS — R7303 Prediabetes: Secondary | ICD-10-CM

## 2024-05-22 DIAGNOSIS — E538 Deficiency of other specified B group vitamins: Secondary | ICD-10-CM

## 2024-05-22 DIAGNOSIS — E66812 Obesity, class 2: Secondary | ICD-10-CM

## 2024-05-22 DIAGNOSIS — K851 Biliary acute pancreatitis without necrosis or infection: Secondary | ICD-10-CM

## 2024-05-22 MED ORDER — METFORMIN HCL 500 MG PO TABS: ORAL_TABLET | ORAL | 1 refills | Status: AC

## 2024-05-22 MED ORDER — VITAMIN D (ERGOCALCIFEROL) 1.25 MG (50000 UNIT) PO CAPS: 50000.0000 [IU] | ORAL_CAPSULE | ORAL | 1 refills | Status: AC

## 2024-05-23 ENCOUNTER — Encounter: Payer: Self-pay | Admitting: Internal Medicine

## 2024-05-23 ENCOUNTER — Ambulatory Visit (INDEPENDENT_AMBULATORY_CARE_PROVIDER_SITE_OTHER): Admitting: Family Medicine

## 2024-05-23 ENCOUNTER — Ambulatory Visit: Admitting: Internal Medicine

## 2024-05-23 ENCOUNTER — Ambulatory Visit: Payer: Self-pay | Admitting: Internal Medicine

## 2024-05-23 VITALS — BP 118/80 | HR 72 | Temp 98.5°F | Ht 64.0 in | Wt 183.6 lb

## 2024-05-23 DIAGNOSIS — R748 Abnormal levels of other serum enzymes: Secondary | ICD-10-CM | POA: Diagnosis not present

## 2024-05-23 DIAGNOSIS — Z79899 Other long term (current) drug therapy: Secondary | ICD-10-CM

## 2024-05-23 DIAGNOSIS — R160 Hepatomegaly, not elsewhere classified: Secondary | ICD-10-CM

## 2024-05-23 DIAGNOSIS — R7303 Prediabetes: Secondary | ICD-10-CM | POA: Diagnosis not present

## 2024-05-23 DIAGNOSIS — Z87898 Personal history of other specified conditions: Secondary | ICD-10-CM | POA: Diagnosis not present

## 2024-05-23 LAB — HEPATIC FUNCTION PANEL
ALT: 11 U/L (ref 3–35)
AST: 11 U/L (ref 5–37)
Albumin: 4.6 g/dL (ref 3.5–5.2)
Alkaline Phosphatase: 43 U/L (ref 39–117)
Bilirubin, Direct: 0 mg/dL — ABNORMAL LOW (ref 0.1–0.3)
Total Bilirubin: 0.3 mg/dL (ref 0.2–1.2)
Total Protein: 7.6 g/dL (ref 6.0–8.3)

## 2024-05-23 LAB — CBC WITH DIFFERENTIAL/PLATELET
Basophils Absolute: 0 10*3/uL (ref 0.0–0.1)
Basophils Relative: 0.3 % (ref 0.0–3.0)
Eosinophils Absolute: 0.3 10*3/uL (ref 0.0–0.7)
Eosinophils Relative: 3.6 % (ref 0.0–5.0)
HCT: 39.4 % (ref 36.0–46.0)
Hemoglobin: 13.2 g/dL (ref 12.0–15.0)
Lymphocytes Relative: 23.5 % (ref 12.0–46.0)
Lymphs Abs: 2.2 10*3/uL (ref 0.7–4.0)
MCHC: 33.6 g/dL (ref 30.0–36.0)
MCV: 85.1 fl (ref 78.0–100.0)
Monocytes Absolute: 0.8 10*3/uL (ref 0.1–1.0)
Monocytes Relative: 8.4 % (ref 3.0–12.0)
Neutro Abs: 6 10*3/uL (ref 1.4–7.7)
Neutrophils Relative %: 64.2 % (ref 43.0–77.0)
Platelets: 390 10*3/uL (ref 150.0–400.0)
RBC: 4.63 Mil/uL (ref 3.87–5.11)
RDW: 14.8 % (ref 11.5–15.5)
WBC: 9.4 10*3/uL (ref 4.0–10.5)

## 2024-05-23 LAB — BASIC METABOLIC PANEL WITH GFR
BUN: 14 mg/dL (ref 6–23)
CO2: 27 meq/L (ref 19–32)
Calcium: 9.9 mg/dL (ref 8.4–10.5)
Chloride: 102 meq/L (ref 96–112)
Creatinine, Ser: 0.76 mg/dL (ref 0.40–1.20)
GFR: 101.82 mL/min
Glucose, Bld: 88 mg/dL (ref 70–99)
Potassium: 4.1 meq/L (ref 3.5–5.1)
Sodium: 136 meq/L (ref 135–145)

## 2024-05-23 NOTE — Progress Notes (Signed)
 Labs including liver and kidney and blood count now all normal . This is reassuring. I placed a referral to the Gi dept  to also advice about restarting the  tirzepatide  . Sharing info with weight management team .

## 2024-05-23 NOTE — Patient Instructions (Addendum)
 Calendar how much tylenol   ibuprofen  taking  per day . And send in my chart   Could contribute   the   sx sounded like gall bladder dysfunction. Glad is better .   Will need to  have the GI team  opine also and  advise about restarting  glp1 gip. Refer.

## 2024-06-20 ENCOUNTER — Ambulatory Visit: Admitting: Family Medicine

## 2024-07-18 ENCOUNTER — Ambulatory Visit (HOSPITAL_BASED_OUTPATIENT_CLINIC_OR_DEPARTMENT_OTHER): Admitting: Cardiology

## 2024-08-07 ENCOUNTER — Telehealth (HOSPITAL_COMMUNITY): Admitting: Psychiatry
